# Patient Record
Sex: Female | Born: 1945 | Race: Black or African American | Hispanic: No | Marital: Single | Smoking: Former smoker
Health system: Southern US, Community
[De-identification: ages and names within clinical notes are randomized; demographics above are authoritative.]

## PROBLEM LIST (undated history)

## (undated) DIAGNOSIS — D496 Neoplasm of unspecified behavior of brain: Secondary | ICD-10-CM

## (undated) DIAGNOSIS — R609 Edema, unspecified: Secondary | ICD-10-CM

## (undated) DIAGNOSIS — F32A Depression, unspecified: Secondary | ICD-10-CM

## (undated) DIAGNOSIS — G8929 Other chronic pain: Secondary | ICD-10-CM

## (undated) DIAGNOSIS — L439 Lichen planus, unspecified: Secondary | ICD-10-CM

## (undated) DIAGNOSIS — IMO0002 Reserved for concepts with insufficient information to code with codable children: Secondary | ICD-10-CM

## (undated) DIAGNOSIS — G4700 Insomnia, unspecified: Secondary | ICD-10-CM

## (undated) DIAGNOSIS — R9430 Abnormal result of cardiovascular function study, unspecified: Secondary | ICD-10-CM

## (undated) DIAGNOSIS — I10 Essential (primary) hypertension: Secondary | ICD-10-CM

## (undated) DIAGNOSIS — F329 Major depressive disorder, single episode, unspecified: Secondary | ICD-10-CM

## (undated) DIAGNOSIS — M25569 Pain in unspecified knee: Secondary | ICD-10-CM

## (undated) DIAGNOSIS — G4730 Sleep apnea, unspecified: Secondary | ICD-10-CM

## (undated) DIAGNOSIS — R0981 Nasal congestion: Secondary | ICD-10-CM

## (undated) DIAGNOSIS — C801 Malignant (primary) neoplasm, unspecified: Secondary | ICD-10-CM

## (undated) DIAGNOSIS — Z01810 Encounter for preprocedural cardiovascular examination: Secondary | ICD-10-CM

## (undated) DIAGNOSIS — K759 Inflammatory liver disease, unspecified: Secondary | ICD-10-CM

## (undated) DIAGNOSIS — N289 Disorder of kidney and ureter, unspecified: Secondary | ICD-10-CM

## (undated) DIAGNOSIS — Z9689 Presence of other specified functional implants: Secondary | ICD-10-CM

## (undated) DIAGNOSIS — M1990 Unspecified osteoarthritis, unspecified site: Secondary | ICD-10-CM

## (undated) DIAGNOSIS — M549 Dorsalgia, unspecified: Secondary | ICD-10-CM

## (undated) DIAGNOSIS — F419 Anxiety disorder, unspecified: Secondary | ICD-10-CM

## (undated) DIAGNOSIS — Z720 Tobacco use: Secondary | ICD-10-CM

## (undated) DIAGNOSIS — E119 Type 2 diabetes mellitus without complications: Secondary | ICD-10-CM

## (undated) HISTORY — DX: Encounter for preprocedural cardiovascular examination: Z01.810

## (undated) HISTORY — DX: Edema, unspecified: R60.9

## (undated) HISTORY — PX: NOSE SURGERY: SHX723

## (undated) HISTORY — DX: Abnormal result of cardiovascular function study, unspecified: R94.30

## (undated) HISTORY — PX: ABDOMINAL HYSTERECTOMY: SHX81

## (undated) HISTORY — PX: BACK SURGERY: SHX140

## (undated) HISTORY — DX: Reserved for concepts with insufficient information to code with codable children: IMO0002

## (undated) HISTORY — DX: Tobacco use: Z72.0

## (undated) HISTORY — DX: Neoplasm of unspecified behavior of brain: D49.6

## (undated) HISTORY — PX: KNEE SURGERY: SHX244

---

## 1968-11-25 DIAGNOSIS — K759 Inflammatory liver disease, unspecified: Secondary | ICD-10-CM

## 1968-11-25 HISTORY — DX: Inflammatory liver disease, unspecified: K75.9

## 1999-01-27 ENCOUNTER — Ambulatory Visit (HOSPITAL_COMMUNITY): Admission: RE | Admit: 1999-01-27 | Discharge: 1999-01-27 | Payer: Self-pay | Admitting: Family Medicine

## 1999-01-27 ENCOUNTER — Encounter: Payer: Self-pay | Admitting: Neurosurgery

## 1999-04-29 ENCOUNTER — Inpatient Hospital Stay (HOSPITAL_COMMUNITY): Admission: RE | Admit: 1999-04-29 | Discharge: 1999-05-02 | Payer: Self-pay | Admitting: Neurosurgery

## 1999-04-29 ENCOUNTER — Encounter: Payer: Self-pay | Admitting: Neurosurgery

## 1999-09-07 ENCOUNTER — Encounter: Payer: Self-pay | Admitting: Neurosurgery

## 1999-09-07 ENCOUNTER — Encounter: Admission: RE | Admit: 1999-09-07 | Discharge: 1999-09-07 | Payer: Self-pay | Admitting: Neurosurgery

## 2000-10-29 ENCOUNTER — Encounter: Admission: RE | Admit: 2000-10-29 | Discharge: 2000-10-29 | Payer: Self-pay | Admitting: Neurosurgery

## 2000-10-29 ENCOUNTER — Encounter: Payer: Self-pay | Admitting: Neurosurgery

## 2001-01-06 ENCOUNTER — Emergency Department (HOSPITAL_COMMUNITY): Admission: EM | Admit: 2001-01-06 | Discharge: 2001-01-06 | Payer: Self-pay | Admitting: Emergency Medicine

## 2001-01-24 ENCOUNTER — Encounter (HOSPITAL_COMMUNITY): Admission: RE | Admit: 2001-01-24 | Discharge: 2001-02-23 | Payer: Self-pay | Admitting: Sports Medicine

## 2001-04-29 ENCOUNTER — Emergency Department (HOSPITAL_COMMUNITY): Admission: EM | Admit: 2001-04-29 | Discharge: 2001-04-29 | Payer: Self-pay | Admitting: Emergency Medicine

## 2001-06-18 ENCOUNTER — Ambulatory Visit (HOSPITAL_COMMUNITY): Admission: RE | Admit: 2001-06-18 | Discharge: 2001-06-18 | Payer: Self-pay | Admitting: Sports Medicine

## 2001-06-18 ENCOUNTER — Encounter: Payer: Self-pay | Admitting: Sports Medicine

## 2002-10-02 ENCOUNTER — Emergency Department (HOSPITAL_COMMUNITY): Admission: EM | Admit: 2002-10-02 | Discharge: 2002-10-02 | Payer: Self-pay | Admitting: Emergency Medicine

## 2002-12-19 ENCOUNTER — Encounter: Admission: RE | Admit: 2002-12-19 | Discharge: 2002-12-19 | Payer: Self-pay | Admitting: Neurosurgery

## 2002-12-19 ENCOUNTER — Encounter: Payer: Self-pay | Admitting: Neurosurgery

## 2003-01-09 ENCOUNTER — Encounter: Admission: RE | Admit: 2003-01-09 | Discharge: 2003-01-09 | Payer: Self-pay | Admitting: Neurosurgery

## 2003-01-09 ENCOUNTER — Encounter: Payer: Self-pay | Admitting: Neurosurgery

## 2003-12-27 ENCOUNTER — Emergency Department (HOSPITAL_COMMUNITY): Admission: EM | Admit: 2003-12-27 | Discharge: 2003-12-27 | Payer: Self-pay | Admitting: Emergency Medicine

## 2004-03-27 DIAGNOSIS — C801 Malignant (primary) neoplasm, unspecified: Secondary | ICD-10-CM

## 2004-03-27 HISTORY — DX: Malignant (primary) neoplasm, unspecified: C80.1

## 2004-08-20 ENCOUNTER — Emergency Department (HOSPITAL_COMMUNITY): Admission: EM | Admit: 2004-08-20 | Discharge: 2004-08-20 | Payer: Self-pay | Admitting: Emergency Medicine

## 2004-10-30 ENCOUNTER — Emergency Department (HOSPITAL_COMMUNITY): Admission: EM | Admit: 2004-10-30 | Discharge: 2004-10-31 | Payer: Self-pay | Admitting: Emergency Medicine

## 2007-07-09 ENCOUNTER — Ambulatory Visit: Admission: RE | Admit: 2007-07-09 | Discharge: 2007-07-09 | Payer: Self-pay | Admitting: Internal Medicine

## 2007-09-12 ENCOUNTER — Emergency Department (HOSPITAL_COMMUNITY): Admission: EM | Admit: 2007-09-12 | Discharge: 2007-09-12 | Payer: Self-pay | Admitting: Emergency Medicine

## 2008-03-30 ENCOUNTER — Encounter: Admission: RE | Admit: 2008-03-30 | Discharge: 2008-03-30 | Payer: Self-pay | Admitting: Neurosurgery

## 2008-04-23 ENCOUNTER — Ambulatory Visit (HOSPITAL_COMMUNITY): Admission: RE | Admit: 2008-04-23 | Discharge: 2008-04-24 | Payer: Self-pay | Admitting: Neurosurgery

## 2008-06-25 ENCOUNTER — Emergency Department (HOSPITAL_COMMUNITY): Admission: EM | Admit: 2008-06-25 | Discharge: 2008-06-25 | Payer: Self-pay | Admitting: Emergency Medicine

## 2008-08-20 ENCOUNTER — Emergency Department (HOSPITAL_COMMUNITY): Admission: EM | Admit: 2008-08-20 | Discharge: 2008-08-20 | Payer: Self-pay | Admitting: Emergency Medicine

## 2009-03-22 ENCOUNTER — Encounter: Admission: RE | Admit: 2009-03-22 | Discharge: 2009-03-22 | Payer: Self-pay | Admitting: Neurology

## 2009-05-06 ENCOUNTER — Encounter
Admission: RE | Admit: 2009-05-06 | Discharge: 2009-05-06 | Payer: Self-pay | Admitting: Physical Medicine and Rehabilitation

## 2009-07-22 ENCOUNTER — Emergency Department (HOSPITAL_COMMUNITY): Admission: EM | Admit: 2009-07-22 | Discharge: 2009-07-22 | Payer: Self-pay | Admitting: Emergency Medicine

## 2009-08-16 ENCOUNTER — Emergency Department (HOSPITAL_COMMUNITY): Admission: EM | Admit: 2009-08-16 | Discharge: 2009-08-16 | Payer: Self-pay | Admitting: Emergency Medicine

## 2009-09-08 ENCOUNTER — Emergency Department (HOSPITAL_COMMUNITY): Admission: EM | Admit: 2009-09-08 | Discharge: 2009-09-08 | Payer: Self-pay | Admitting: Emergency Medicine

## 2009-09-13 ENCOUNTER — Encounter: Admission: RE | Admit: 2009-09-13 | Discharge: 2009-09-13 | Payer: Self-pay | Admitting: Neurosurgery

## 2009-10-18 ENCOUNTER — Emergency Department (HOSPITAL_COMMUNITY): Admission: EM | Admit: 2009-10-18 | Discharge: 2009-10-18 | Payer: Self-pay | Admitting: Emergency Medicine

## 2009-11-15 ENCOUNTER — Emergency Department (HOSPITAL_COMMUNITY): Admission: EM | Admit: 2009-11-15 | Discharge: 2009-11-15 | Payer: Self-pay | Admitting: Emergency Medicine

## 2010-02-06 ENCOUNTER — Emergency Department (HOSPITAL_COMMUNITY): Admission: EM | Admit: 2010-02-06 | Discharge: 2010-02-06 | Payer: Self-pay | Admitting: Emergency Medicine

## 2010-03-10 ENCOUNTER — Emergency Department (HOSPITAL_COMMUNITY)
Admission: EM | Admit: 2010-03-10 | Discharge: 2010-03-10 | Payer: Self-pay | Source: Home / Self Care | Admitting: Emergency Medicine

## 2010-03-20 ENCOUNTER — Emergency Department (HOSPITAL_COMMUNITY)
Admission: EM | Admit: 2010-03-20 | Discharge: 2010-03-20 | Payer: Self-pay | Source: Home / Self Care | Admitting: Emergency Medicine

## 2010-03-27 HISTORY — PX: BRAIN SURGERY: SHX531

## 2010-04-17 ENCOUNTER — Encounter: Payer: Self-pay | Admitting: Physical Medicine and Rehabilitation

## 2010-04-17 ENCOUNTER — Encounter: Payer: Self-pay | Admitting: Neurology

## 2010-04-26 ENCOUNTER — Other Ambulatory Visit: Payer: Self-pay | Admitting: Neurosurgery

## 2010-04-26 DIAGNOSIS — R200 Anesthesia of skin: Secondary | ICD-10-CM

## 2010-04-26 DIAGNOSIS — M549 Dorsalgia, unspecified: Secondary | ICD-10-CM

## 2010-04-28 ENCOUNTER — Ambulatory Visit
Admission: RE | Admit: 2010-04-28 | Discharge: 2010-04-28 | Disposition: A | Discharge status: Home / Self Care | Payer: MEDICARE | Source: Ambulatory Visit | Attending: Neurosurgery | Admitting: Neurosurgery

## 2010-04-28 DIAGNOSIS — R200 Anesthesia of skin: Secondary | ICD-10-CM

## 2010-04-28 DIAGNOSIS — M549 Dorsalgia, unspecified: Secondary | ICD-10-CM

## 2010-04-28 MED ORDER — GADOBENATE DIMEGLUMINE 529 MG/ML IV SOLN: 15.0000 mL | Freq: Once | INTRAVENOUS | Status: AC | PRN

## 2010-06-06 ENCOUNTER — Emergency Department (HOSPITAL_COMMUNITY)
Admission: EM | Admit: 2010-06-06 | Discharge: 2010-06-07 | Disposition: A | Discharge status: Home / Self Care | Payer: Medicare Other | Attending: Emergency Medicine | Admitting: Emergency Medicine

## 2010-06-06 ENCOUNTER — Emergency Department (HOSPITAL_COMMUNITY)
Admission: EM | Admit: 2010-06-06 | Discharge: 2010-06-06 | Discharge status: Against Medical Advice | Payer: Medicare Other | Attending: Emergency Medicine | Admitting: Emergency Medicine

## 2010-06-06 DIAGNOSIS — M545 Low back pain, unspecified: Secondary | ICD-10-CM | POA: Insufficient documentation

## 2010-06-06 DIAGNOSIS — G4730 Sleep apnea, unspecified: Secondary | ICD-10-CM | POA: Insufficient documentation

## 2010-06-06 DIAGNOSIS — E119 Type 2 diabetes mellitus without complications: Secondary | ICD-10-CM | POA: Insufficient documentation

## 2010-06-06 LAB — URINALYSIS, ROUTINE W REFLEX MICROSCOPIC
Glucose, UA: NEGATIVE mg/dL
Leukocytes, UA: NEGATIVE
Nitrite: NEGATIVE

## 2010-06-06 LAB — URINE MICROSCOPIC-ADD ON

## 2010-06-07 ENCOUNTER — Emergency Department (HOSPITAL_COMMUNITY)
Admission: EM | Admit: 2010-06-07 | Discharge: 2010-06-07 | Disposition: A | Discharge status: Home / Self Care | Payer: Medicare Other | Attending: Emergency Medicine | Admitting: Emergency Medicine

## 2010-06-07 DIAGNOSIS — I10 Essential (primary) hypertension: Secondary | ICD-10-CM | POA: Insufficient documentation

## 2010-06-07 DIAGNOSIS — G8929 Other chronic pain: Secondary | ICD-10-CM | POA: Insufficient documentation

## 2010-06-07 DIAGNOSIS — M549 Dorsalgia, unspecified: Secondary | ICD-10-CM | POA: Insufficient documentation

## 2010-06-07 DIAGNOSIS — Z79899 Other long term (current) drug therapy: Secondary | ICD-10-CM | POA: Insufficient documentation

## 2010-06-07 DIAGNOSIS — Z9889 Other specified postprocedural states: Secondary | ICD-10-CM | POA: Insufficient documentation

## 2010-06-07 DIAGNOSIS — E119 Type 2 diabetes mellitus without complications: Secondary | ICD-10-CM | POA: Insufficient documentation

## 2010-07-05 LAB — URINALYSIS, ROUTINE W REFLEX MICROSCOPIC
Ketones, ur: NEGATIVE mg/dL
Nitrite: NEGATIVE
Protein, ur: >300 mg/dL — AB
Specific Gravity, Urine: 1.025 (ref 1.005–1.030)
pH: 7 (ref 5.0–8.0)

## 2010-07-05 LAB — BASIC METABOLIC PANEL
BUN: 14 mg/dL (ref 6–23)
CO2: 29 mEq/L (ref 19–32)
Calcium: 9.5 mg/dL (ref 8.4–10.5)
Chloride: 104 mEq/L (ref 96–112)
Creatinine, Ser: 1.02 mg/dL (ref 0.4–1.2)
GFR calc Af Amer: >60 mL/min (ref >60)
GFR calc non Af Amer: 55 mL/min — ABNORMAL LOW (ref >60)
Glucose, Bld: 88 mg/dL (ref 70–99)

## 2010-07-05 LAB — CBC
HCT: 40.8 % (ref 36.0–46.0)
Hemoglobin: 14 g/dL (ref 12.0–15.0)
MCHC: 34.2 g/dL (ref 30.0–36.0)
RBC: 4.44 MIL/uL (ref 3.87–5.11)
RDW: 13.6 % (ref 11.5–15.5)

## 2010-07-05 LAB — DIFFERENTIAL
Basophils Absolute: 0 10*3/uL (ref 0.0–0.1)
Eosinophils Relative: 2 % (ref 0–5)
Lymphocytes Relative: 12 % (ref 12–46)
Monocytes Absolute: 0.8 10*3/uL (ref 0.1–1.0)
Monocytes Relative: 7 % (ref 3–12)

## 2010-07-06 LAB — CBC
HCT: 40.8 % (ref 36.0–46.0)
Hemoglobin: 13.5 g/dL (ref 12.0–15.0)
MCHC: 33.2 g/dL (ref 30.0–36.0)
Platelets: 313 10*3/uL (ref 150–400)
RDW: 16.2 % — ABNORMAL HIGH (ref 11.5–15.5)

## 2010-07-06 LAB — DIFFERENTIAL
Lymphocytes Relative: 6 % — ABNORMAL LOW (ref 12–46)
Lymphs Abs: 0.8 10*3/uL (ref 0.7–4.0)
Monocytes Absolute: 0.5 10*3/uL (ref 0.1–1.0)
Monocytes Relative: 4 % (ref 3–12)
Neutro Abs: 11.7 10*3/uL — ABNORMAL HIGH (ref 1.7–7.7)
Neutrophils Relative %: 89 % — ABNORMAL HIGH (ref 43–77)

## 2010-07-06 LAB — COMPREHENSIVE METABOLIC PANEL
Albumin: 4 g/dL (ref 3.5–5.2)
Alkaline Phosphatase: 111 U/L (ref 39–117)
BUN: 9 mg/dL (ref 6–23)
Calcium: 9.6 mg/dL (ref 8.4–10.5)
Creatinine, Ser: 0.84 mg/dL (ref 0.4–1.2)
Glucose, Bld: 150 mg/dL — ABNORMAL HIGH (ref 70–99)
Potassium: 3.8 mEq/L (ref 3.5–5.1)
Total Protein: 7.9 g/dL (ref 6.0–8.3)

## 2010-07-11 LAB — BASIC METABOLIC PANEL
CO2: 25 mEq/L (ref 19–32)
Calcium: 9.7 mg/dL (ref 8.4–10.5)
Chloride: 103 mEq/L (ref 96–112)
GFR calc Af Amer: >60 mL/min (ref >60)
Glucose, Bld: 118 mg/dL — ABNORMAL HIGH (ref 70–99)
Potassium: 4.5 mEq/L (ref 3.5–5.1)
Sodium: 137 mEq/L (ref 135–145)

## 2010-07-11 LAB — CBC
HCT: 43 % (ref 36.0–46.0)
Hemoglobin: 13.9 g/dL (ref 12.0–15.0)
MCHC: 32.2 g/dL (ref 30.0–36.0)
RBC: 4.78 MIL/uL (ref 3.87–5.11)
RDW: 14.8 % (ref 11.5–15.5)

## 2010-07-11 LAB — TYPE AND SCREEN: Antibody Screen: NEGATIVE

## 2010-08-09 NOTE — Op Note (Signed)
NAMEBRISELDA, Rebecca Richards              ACCOUNT NO.:  [id number]   MEDICAL RECORD NO.:  [id number]          PATIENT TYPE:  INP   LOCATION:  2899                         FACILITY:  [facility]   PHYSICIAN:  Reinaldo Meeker, M.D. 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   PROCEDURE:  L4-5 anterior lumbar fusion from lateral retroperitoneal  approach with PEEK interbody spacer followed by L4-5 nonsegmental  instrumentation with the XLP plate.   SURGEON:  Reinaldo Meeker, MD   ASSISTANT:  Tia Alert, MD   PROCEDURE IN DETAIL:  After being placed in the lateral position with  the right side up, localizing fluoroscopy was used to identify the  appropriate level.  Area was then prepped and draped in usual sterile  fashion.  A small incision was made certainly posterior to the main  incision and using blunt finger dissection passing the fascial planes,  we were able to easily enter the retroperitoneal space.  The skin was  then used as a guide for the main incision to come down into the  retroperitoneum as well.  Sequential dilation was then used through the  psoas muscle.  Using a EMG monitoring.  On our initial approach twice we  could not get safe electronic reading and we felt that there were nerves  close particularly in the posterior direction.  Therefore, I landed more  anterior, did our dilation there, which was fine electrically, put our  retractor and then swept the retractor posterior and tested as we did  and there was no evidence of any irritation of any significant nerves,  we felt that we were in the good position at that time.  Retractor was  then sequentially opened and the disk space shim placed without  difficulty.  AP and lateral fluoroscopy showed excellent  placement of  the retractor.  The disk space was incised with a 15 blade and  thoroughly cleaned out with a variety of curettes.  Cobb periosteal  elevator was then passed across the inferior superior edge of the  endplates of L4 and L5, and then actually through the annulus on the  opposite side to free up the vertebral bodies for interbody device.  When this space was well cleared out.  We used a variety of sizers.  We  eventually decided to use a implant that was 10 lordotic, which was 10  anterior and 10 posterior.  This was then filled with OsteoSet Plus and  then packed without difficulty.  Fluoroscopy followed into good  position.  An 8-mm lateral plate of the XLP variety was then chosen.  The guide was then placed and drilling was carried out.  A 50-mm screw  was placed at L4 and a 45-mm screw at L5.  These were followed in  excellent position under fluoroscopy.  The plate was then placed over  them and the top loading nuts secured without difficulty  until the  torquing device was engaged.  Final x-rays in AP and lateral direction,  showed excellent placement of the cage plate and screws.  Irrigation was  carried out and  then the wound was closed in multiple layers of Vicryl on the fascia,  subcutaneous subcu tissues and Steri-Strips were placed on the skin.  Sterile dressing was then applied.  The patient was extubated and taken  to recovery room in stable condition.           ______________________________  Reinaldo Meeker, M.D.     ROK/MEDQ  D:  04/23/2008  T:  04/24/2008  Job:  [id number]

## 2010-08-12 NOTE — Procedures (Signed)
NAMEGERALDA, Rebecca Richards              ACCOUNT NO.:  [id number]   MEDICAL RECORD NO.:  [id number]          PATIENT TYPE:  OUT   LOCATION:  SLEE                          FACILITY:  [facility]   PHYSICIAN:  Kofi A. Gerilyn Pilgrim, M.D. DATE OF BIRTH:  1945/05/14   DATE OF PROCEDURE:  DATE OF DISCHARGE:  07/09/2007                             SLEEP DISORDER REPORT   POLYSOMNOGRAPHY REPORT.   REFERRING PHYSICIAN:  Kofi A. Gerilyn Pilgrim, M.D.   INDICATION:  This is a 65 year old lady who presents with daytime  sleepiness, loud snoring, suspicious for sleep apnea.   MEDICATIONS:  1. Wellbutrin.  2. Lotrel.  3. Vicodin.  4. Xanax.  5. Afrin nasal spray.   BMI 36 and Epworth sleepiness scale of 14.   SLEEP STAGE SUMMARY:  This was a split night study with the first half  having a total recording time of  136 minutes.  The titration was 230  minutes.  The sleep latency in diagnostic portion is 2 minutes and REM  latency 41 minutes.  Sleep efficiency in the diagnostic portion 83% and  91% to the titration portion.   RESPIRATORY SUMMARY:  The baseline oxygen saturation is 96%, the lowest  saturation is 77%.  The AHI of the diagnostic portion shows 113.  There  were 56 obstructive events and 159 hypopneic events.  The patient was  titrated between a pressure of 5 and 15.  She does very well between  pressure of 13 and 15 and could utilize any of these pressures.   LEG MOVEMENT SUMMARY:  She did have some PLM noted on the final  pressure.  The index is 11.   ELECTROCARDIOGRAM SUMMARY:  She had rare PVCs with average heart rate at  78.   IMPRESSION:  Severe obstructive sleep apnea syndrome which responded  well to pressure between 13 and 15.  I would recommend 13 which is the  lowest effective pressure.   Thanks for this referral.      Kofi A. Gerilyn Pilgrim, M.D.  Electronically Signed     KAD/MEDQ  D:  07/19/2007  T:  07/20/2007  Job:  [id number]

## 2010-08-12 NOTE — H&P (Signed)
[place]  Patient:    Rebecca Richards                        MRN: [id number] Adm. Date:  [id number] Attending:  Danella Penton                         History and Physical  HISTORY:  Rebecca Richards is a lady who I saw initially back in September 2000 because of back pain down to both legs, right worse than the left one.  The patient denies any sensory changes, and according to her she has good days and bad days.  The patient was seen by an orthopedic surgeon.  Later on had evaluation by a neurologist here in [place], who gave conservative treatment without improvement.  As a part of the work-up, she had an MRI and EMG and nerve conduction velocity.  Because of the findings she was seen by [place].  PAST MEDICAL HISTORY:  Hysterectomy, knee surgery, and sinus surgery.  ALLERGIES:  She is not allergic to any medications.  SOCIAL HISTORY:  The patient does not smoke or drink.  She is 5 feet 1 inches and weighs 195 pounds.  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  FAMILY HISTORY:  Mother is 27 with diabetes.  Father is in good health.  PHYSICAL EXAMINATION:  HEENT:  Normal.  NECK:  She has good flexibility.  LUNGS:  Clear.  HEART:  Heart sounds normal.  ABDOMEN:  Normal.  EXTREMITIES:  Normal pulses.  There are some changes in the skin in both feet secondary to the lichen planus.  There is no edema.  NEUROLOGIC:  Mental status normal.  Cranial nerves normal.  Reflexes 2+.  No Babinskis.  Sensation normal.  Straight leg raising is positive in the right side to 30 degrees, in the left side about 60 degrees.  She has tenderness to palpation in both SI joints.  DIAGNOSTIC STUDIES:  The MRI showed that indeed this lady has a hypertrophy facet at the level of 4-5 and borderline between 3-4.  At the level of 5-1 she has minimal stenosis.  The EMG nerve  conduction velocity showed that she might have L5 radiculopathy.  CLINICAL IMPRESSION:  Lumbar stenosis secondary to hypertrophy of the facet.  RECOMMENDATIONS:  The patient decided to go ahead with surgery.  She knows that the procedure will close to two hours and she is going to have quite a bit of pain after surgery.  The risk, of course, is no improvement, worsening of the pain, eed for further surgery, infection, and CSF leak. DD:  04/29/99 TD:  04/29/99 Job: [id number]

## 2010-08-12 NOTE — Discharge Summary (Signed)
[place]  Patient:    Rebecca Richards                        MRN: [id number] Adm. Date:  [id number] Disc. Date: [id number] Attending:  Danella Penton                           Discharge Summary  ADMISSION DIAGNOSIS:  Lumbar stenosis.  FINAL DIAGNOSIS:  Lumbar stenosis.  CLINICAL HISTORY:  The patient was admitted because of back pain, radiation down to both legs.  X-rays showed stenosis at the level 3-4 and 4-5.  Surgery was advised.  Laboratory normal.  COURSE IN HOSPITAL:  The patient was taken to surgery, and bilateral L4-L5 laminectomy and partial L3, followed by foraminotomy was done.  Today, she is doing much better.  She still has some residual back pain, but the leg pain is getting better.  She is being discharged to be followed by me in my office.  CONDITION ON DISCHARGE:  Improving.  MEDICATIONS:  Percocet, diazepam.  DIET:  Regular.  ACTIVITIES:  Not to drive for at least 10 days.  FOLLOW-UP:  To be seen by me in three weeks. DD:  05/02/99 TD:  05/02/99 Job: [id number]

## 2010-08-12 NOTE — Op Note (Signed)
[place]. [facility]  Patient:    Rebecca Richards                        MRN: [id number] Proc. Date: 04/29/99 Adm. Date:  [id number] Attending:  Danella Penton Dictator:   Tanya Nones. Jeral Fruit, M.D.                           Operative Report  PREOPERATIVE DIAGNOSIS:  Lumbar stenosis L4-5, borderline L3-4.  Spinal canal of 7 mm.  POSTOPERATIVE DIAGNOSIS:  Lumbar stenosis L4-5, borderline L3-4.  Spinal canal f 7 mm.  PROCEDURE:  Bilateral L4-5 laminectomy.  Partial L3 laminectomy, foraminotomy, decompression of the L3-4 and L5-S1 nerve root.  MICROSCOPE:  Midas Rex.  SURGEON:  Dr. Jeral Fruit.  ASSISTANT:  Alanson Aly. Roxan Hockey, M.D.  CLINICAL HISTORY:  The patient is a 65 year old female complaining of back pain  with radiation to both legs, right worse than the left one.  She has failed conservative treatment.  X-rays showed that she has a stenosis between L4-5 with the canal being 7 mm.  She has a borderline at the level of L3-4.  Surgery was advised.  The patient was aware of the risks of infection, CSF leak, worsening pain, paralysis, need for further surgery.  DESCRIPTION OF PROCEDURE:  The patient was taken to the operating room and she underwent procedure.  The back was prepped with Betadine.  A midline incision from L3 to S1 was made.  Muscles were retracted laterally.  We took a x-ray which showed that indeed we were at the level of the L4.  From then on, with the Stille rongeur we removed the ______ process at 5, 4, and partial of 3.  Then with the Midas Rex we did bilateral laminectomy at L4-5 and the level of L3.  We found a thick yellow ligament mostly at the level of L4-5 and also at the level of L3-4.  Removal with the microscope was done.  We drilled laterally, leaving most of the 2/3 of the facet.  With the microscope we did a foraminotomy, decompressing the L3-4 and L5-S1 nerve root.  At the end we had plenty of space.  There  was no evidence of any cerebrospinal fluid leak.  Hemostasis was done with bipolar.  Then fentanyl and  Depo-Medrol were left the epidural space, and the wound was closed with Vicryl nd Steri-Strips. DD:  04/29/99 TD:  04/30/99 Job: [id number]

## 2010-08-30 ENCOUNTER — Emergency Department (HOSPITAL_COMMUNITY)
Admission: EM | Admit: 2010-08-30 | Discharge: 2010-08-30 | Disposition: A | Discharge status: Home / Self Care | Payer: Medicare Other | Attending: Emergency Medicine | Admitting: Emergency Medicine

## 2010-08-30 DIAGNOSIS — R197 Diarrhea, unspecified: Secondary | ICD-10-CM | POA: Insufficient documentation

## 2010-08-30 DIAGNOSIS — E119 Type 2 diabetes mellitus without complications: Secondary | ICD-10-CM | POA: Insufficient documentation

## 2010-08-30 DIAGNOSIS — R1013 Epigastric pain: Secondary | ICD-10-CM | POA: Insufficient documentation

## 2010-08-30 DIAGNOSIS — R112 Nausea with vomiting, unspecified: Secondary | ICD-10-CM | POA: Insufficient documentation

## 2010-08-30 DIAGNOSIS — I10 Essential (primary) hypertension: Secondary | ICD-10-CM | POA: Insufficient documentation

## 2010-08-30 DIAGNOSIS — G4730 Sleep apnea, unspecified: Secondary | ICD-10-CM | POA: Insufficient documentation

## 2010-09-20 ENCOUNTER — Ambulatory Visit (INDEPENDENT_AMBULATORY_CARE_PROVIDER_SITE_OTHER): Payer: Medicare Other | Admitting: Psychiatry

## 2010-09-20 DIAGNOSIS — F411 Generalized anxiety disorder: Secondary | ICD-10-CM

## 2010-09-21 NOTE — Group Therapy Note (Signed)
Rebecca Richards, Rebecca Richards              ACCOUNT NO.:  [id number]  MEDICAL RECORD NO.:  [id number]  LOCATION:  [facility]                           FACILITY:  [facility]  PHYSICIAN:  Tillman Kazmierski T. Ramel Tobon, M.D.   DATE OF BIRTH:  [birth year]                                PROGRESS NOTE  09/21/10 The patient is a 65 year old single, retired, [place] American female who is referred from her primary care doctor for seeking treatment.  The patient endorsed insomnia which has been for a long time. She had tried multiple medication for the insomnia but it appears that none of the medicine works for long period of time.  She likes to get Xanax from her primary care doctor.  However, he refused to give more Xanax and recommended to see psychiatrist.  The patient admitted that she had history of depression on and off for at least 12 years and has taken multiple medication in the past.  However, she believed recently her main concern is lack of sleep.  She recently had back surgery in March for her chronic back pain and now she is using pain stimulator since then.  Patient was told that this stimulator was supposed to reduce her pain and she was hoping that her recovery would be faster but she felt that she still has a struggle dealing with the pain.  She noted that she has been sometimes tired, decreased energy and does not want to talk to someone.  She admitted that she has sleep apnea and take CPAP machine but her sleep is only few hours.  Though she denies any depressive thoughts, suicidal ideation or hopeless feeling, she endorsed sometimes she gets irritable when she does not sleep for more than a few days.  In the past she had tried Xanax, Lunesta, Ambien, trazodone and recently Restoril for insomnia, which she believed did work for few weeks and then this stopped working.  Dr. Sherryll Burger refused to give more Xanax, which she believes helped the most.  She also had tried in past 10 years multiple antidepressants  including Cymbalta, Zoloft, Paxil, Prozac, Pristiq and all of this medicine cause increased jitteriness and restlessness.  She has been taking Wellbutrin, which has been prescribed in the past by Dr. Eliberto Ivory and then Dr. Parke Simmers, and recently by Dr. Sherryll Burger. She told that she was not taking the Wellbutrin.  However, in March after the surgery the doctor recommended to go back on Wellbutrin.  She is taking Wellbutrin 150 twice a day.  The patient is hoping that if her sleep get better, her mood and anxiety will also get better.  Patient told that she has taken in the past Seroquel, which works very well. However, she gained some weight.  Patient told that she is willing to take any medication that can help her sleep.  She also endorsed anxiety and depressed mood on occasions but she denies any hallucination, paranoia or suicidal or homicidal thinking.  PAST PSYCHIATRIC HISTORY: Patient told that she has been taking the antidepressants for almost 12 years.  She did not remember what triggered the depression but believes may be her chronic back pain that has caused her sad mood and depression.  She  denies any previous history of suicidal attempt or any previous history of psychiatric inpatient treatment.  She has never seen psychiatrist but she was working as a Theatre manager in [employer] and has been involved in discussion with doctors  but there were no formal psychiatric follow-ups in the past.  FAMILY HISTORY: The patient endorsed her mother and brother have history of schizophrenia.  PSYCHOSOCIAL HISTORY: The patient was born and raised in [place].  She was never married.  She has a 73 year old son who recently moved in since she had back surgery.  The patient has some contact with the father of her son. The patient is currently retired.  MEDICAL HISTORY: The patient told she has a history of chronic back pain for at least 10 years and she has L4-L5 fusion.   She was diagnosed with L4-5 central canal stenosis.  She also has sleep apnea, hypertension.  Her current primary care doctor is Dr. Sherryll Burger in [place].  She also sees Dr. Gerilyn Pilgrim for chronic pain.  CURRENT MEDICATIONS: 1. Wellbutrin SR 150 twice a day. 2. Amlodipine 5 mg a day. 3. Potassium 10 mEq a day. 4. Suboxone 2 mg prescribed by Dr. Gerilyn Pilgrim. 5. She has been given temazepam.  However, the patient is not taking     as the temazepam stopped working.  ALCOHOL AND SUBSTANCE ABUSE HISTORY: The patient has history of using drugs in her 31s.  She admitted using LSD, cocaine, marijuana and alcohol but her drug of choice was marijuana and alcohol.  She has history of DWI almost 20 years ago.  The patient claimed to be sober from using these drugs for at least 20 years.  EDUCATION AND WORK HISTORY: The patient is a high school graduate with some college.  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  Currently she is disabled.  ALLERGIES: The patient does not remember but do not know of any known drug allergies.  However, the patient reported sensitivity with antidepressant.  MENTAL STATUS EXAM: The patient is casually dressed.  She is of short stature and fairly groomed.  Her speech is soft, clear and coherent.  She maintained a superficial eye contact and at times guarded about her past history. She denies any auditory hallucinations, suicidal thoughts or homicidal thoughts.  Her thought processes were also logical, linear and goal- directed.  Her attention and concentration were okay.  She is alert and oriented x3.  She was using a cane to support her walking.  Her insight, judgment, and impulse control were okay.  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 Axis II:  Deferred. Axis III:  See medical history. Axis IV:  Mild  to moderate. Axis V:  60.  Her weight today was 160 pounds.  PLAN: I talked to the patient at length about her symptoms.  I do believe that she needs a medication that should help her insomnia, anxiety and residual depressive symptoms.  She has taken antidepressants in the past.  However, I discussed with her to try Depakote that can target her mood, insomnia, anxiety at the same time but she is concerned about the weight gain.  However, willing to take the chance if this medicine works well for her insomnia and residual symptoms of depression.  I have also talked in length about the risks and benefits of psychiatric medication. It appears that the patient  is resistant to these sedatives and may need nonnarcotic and noncontrolled medication to help her illness.  I also recommended to decrease her Wellbutrin to take only in the morning as sometimes second Wellbutrin can cause insomnia.  I recommended to see a counselor.  However, the patient will discuss on the next follow-up visit about seeing a therapist.  I recommended to give [place] a call if she has any question or if she have worsening of the symptoms or having any suicidal thinking and homicidal thinking that she needs to call 9-1-1 immediately which she acknowledged.  I will see her again in 3 weeks.     Ryo Klang T. Lolly Mustache, M.D. STA/MEDQ  D:  09/20/2010  T:  09/20/2010  Job:  [id number]  Electronically Signed by Kathryne Sharper M.D. on 09/21/2010 04:31:54 PM

## 2010-12-22 LAB — CBC
Hemoglobin: 12.9
MCHC: 33.3
MCV: 90
RBC: 4.31
WBC: 7.8

## 2010-12-22 LAB — COMPREHENSIVE METABOLIC PANEL
ALT: 13
AST: 22
CO2: 31
Chloride: 104
Creatinine, Ser: 1.28 — ABNORMAL HIGH
GFR calc Af Amer: 52 — ABNORMAL LOW
GFR calc non Af Amer: 43 — ABNORMAL LOW
Glucose, Bld: 133 — ABNORMAL HIGH
Sodium: 142
Total Bilirubin: 0.7

## 2010-12-22 LAB — DIFFERENTIAL
Basophils Absolute: 0
Basophils Relative: 0
Eosinophils Absolute: 0.2
Eosinophils Relative: 3
Neutrophils Relative %: 68

## 2010-12-22 LAB — LIPASE, BLOOD: Lipase: 31

## 2011-03-11 ENCOUNTER — Other Ambulatory Visit (HOSPITAL_COMMUNITY): Payer: Self-pay | Admitting: Psychiatry

## 2011-03-15 ENCOUNTER — Emergency Department (HOSPITAL_COMMUNITY)
Admission: EM | Admit: 2011-03-15 | Discharge: 2011-03-15 | Disposition: A | Discharge status: Home / Self Care | Payer: Medicare Other | Attending: Emergency Medicine | Admitting: Emergency Medicine

## 2011-03-15 ENCOUNTER — Encounter: Payer: Self-pay | Admitting: Emergency Medicine

## 2011-03-15 ENCOUNTER — Emergency Department (HOSPITAL_COMMUNITY): Payer: Medicare Other

## 2011-03-15 DIAGNOSIS — Z859 Personal history of malignant neoplasm, unspecified: Secondary | ICD-10-CM | POA: Insufficient documentation

## 2011-03-15 DIAGNOSIS — F17200 Nicotine dependence, unspecified, uncomplicated: Secondary | ICD-10-CM | POA: Insufficient documentation

## 2011-03-15 DIAGNOSIS — M1710 Unilateral primary osteoarthritis, unspecified knee: Secondary | ICD-10-CM | POA: Insufficient documentation

## 2011-03-15 DIAGNOSIS — M1712 Unilateral primary osteoarthritis, left knee: Secondary | ICD-10-CM

## 2011-03-15 DIAGNOSIS — F329 Major depressive disorder, single episode, unspecified: Secondary | ICD-10-CM | POA: Insufficient documentation

## 2011-03-15 DIAGNOSIS — F3289 Other specified depressive episodes: Secondary | ICD-10-CM | POA: Insufficient documentation

## 2011-03-15 DIAGNOSIS — M25469 Effusion, unspecified knee: Secondary | ICD-10-CM | POA: Insufficient documentation

## 2011-03-15 DIAGNOSIS — I10 Essential (primary) hypertension: Secondary | ICD-10-CM | POA: Insufficient documentation

## 2011-03-15 DIAGNOSIS — M25569 Pain in unspecified knee: Secondary | ICD-10-CM | POA: Insufficient documentation

## 2011-03-15 DIAGNOSIS — M79609 Pain in unspecified limb: Secondary | ICD-10-CM | POA: Insufficient documentation

## 2011-03-15 DIAGNOSIS — IMO0002 Reserved for concepts with insufficient information to code with codable children: Secondary | ICD-10-CM | POA: Insufficient documentation

## 2011-03-15 HISTORY — DX: Disorder of kidney and ureter, unspecified: N28.9

## 2011-03-15 HISTORY — DX: Depression, unspecified: F32.A

## 2011-03-15 HISTORY — DX: Major depressive disorder, single episode, unspecified: F32.9

## 2011-03-15 HISTORY — DX: Malignant (primary) neoplasm, unspecified: C80.1

## 2011-03-15 HISTORY — DX: Essential (primary) hypertension: I10

## 2011-03-15 MED ORDER — OXYCODONE-ACETAMINOPHEN 5-325 MG PO TABS
1.0000 | ORAL_TABLET | Freq: Once | ORAL | Status: AC
  Administered 2011-03-15: 1 via ORAL
  Filled 2011-03-15: qty 1

## 2011-03-15 MED ORDER — IBUPROFEN 600 MG PO TABS: 600.0000 mg | ORAL_TABLET | Freq: Four times a day (QID) | ORAL | Status: AC | PRN

## 2011-03-15 MED ORDER — KETOROLAC TROMETHAMINE 30 MG/ML IJ SOLN
INTRAMUSCULAR | Status: AC
  Administered 2011-03-15: 15 mg via INTRAMUSCULAR
  Filled 2011-03-15: qty 1

## 2011-03-15 MED ORDER — KETOROLAC TROMETHAMINE 15 MG/ML IJ SOLN
15.0000 mg | Freq: Once | INTRAMUSCULAR | Status: DC
  Filled 2011-03-15: qty 1

## 2011-03-15 NOTE — ED Notes (Signed)
Left in c/o family for transport home; instructions reviewed and f/u information provided-verbalizes understanding.

## 2011-03-15 NOTE — ED Provider Notes (Signed)
History     CSN: [id number] Arrival date & time: 03/15/2011  8:27 AM   First MD Initiated Contact with Patient 03/15/11 [id number]      Chief Complaint  Patient presents with  . Leg Pain    (Consider location/radiation/quality/duration/timing/severity/associated sxs/prior treatment) HPI 65 year old with history of OA, lumbar laminectomy/fusion, and tricompatmental L knee degeneration comes in with L knee pain since yesterday.  Was kneeling in living room getting out Christmas ornaments for a while.  Following this, her knee was throbbing.  Two hours later, she was having severe L knee pain.  Diffuse around the knee.  Considerable swelling as well, so much that she had trouble fitting her pajama pants over the knee.  No fevers or chills. No rash.  At baseline she walks with a walker and can walk less than a block due to L knee pain.    She is trying to get an appt to see Dr. Romeo Apple in orthopaedics after the holidays.    Has a history of polysubstance abuse but only tobacco and occasional alcohol for past two years.    Past Medical History  Diagnosis Date  . Depression   . Guillain-Barre   . Hypertension   . Renal disorder   . Cancer     Past Surgical History  Procedure Date  . Abdominal hysterectomy   . Knee surgery   . Back surgery   . Nose surgery     History reviewed. No pertinent family history.  History  Substance Use Topics  . Smoking status: Passive Smoker  . Smokeless tobacco: Not on file  . Alcohol Use: No     Review of Systems  All other systems reviewed and are negative.    Allergies  Review of patient's allergies indicates no known allergies.  Home Medications   Current Outpatient Rx  Name Route Sig Dispense Refill  . BUPROPION HCL ER (SR) 150 MG PO TB12 Oral Take 150 mg by mouth daily.        BP 130/71  Pulse 96  Temp 98.4 F (36.9 C)  Resp 20  Ht 5\' 1"  (1.549 m)  Wt 178 lb (80.74 kg)  BMI 33.63 kg/m2  SpO2 96%  Physical  Exam  General: alert, well-developed, and cooperative to examination.  Head: normocephalic and atraumatic.  Eyes: vision grossly intact, pupils equal, pupils round, pupils reactive to light, no injection and anicteric.  Mouth: pharynx pink and moist, no erythema, and no exudates.  Lungs: normal respiratory effort, no accessory muscle use, normal breath sounds, no crackles, and no wheezes. Heart: normal rate, regular rhythm, no murmur, no gallop, and no rub.  Pulses: 2+ DP/PT pulses bilaterally  Extremities: R leg: No swelling, erythema. Full knee ROM without pain     L leg: L knee swollen compared to right.  Significant effusion present.  No erythema or ecchymosis.  Pain with extension but achieves full extension.  Pain with flexion past 30 degrees, did not push her past 45 degrees due to extent of pain.  Tender to palpation diffusely around knee, worst anteriorly.  Patellar motion causes some pain but not nearly as bad as knee flexion.  L foot has sensation in all toes and good pulses.  Neurologic: alert & oriented X3, cranial nerves II-XII intact. Skin: turgor normal and no rashes.    ED Course  Procedures (including critical care time)  Toradol shot and one percocet 5/325 tablet given Knee immobilizer given as well for pt to wear at  home if it helps her pain/mobility  1. Degenerative arthritis of left knee       MDM  I reviewed this case with my attending Dr. Adriana Simas who also interviewed and examined the patient.  Plan is to discharge with ibuprofen and knee immobilizer.  Follow-up with Dr.  Romeo Apple in orthopaedics.  Patient instructed not to take [facility] or goody powder while taking ibuprofen.       Blanca Friend, MD 03/15/11 1005  Blanca Friend, MD 03/15/11 1008

## 2011-03-15 NOTE — ED Notes (Signed)
Pt c/o left knee pain since yesterday. Denies injury.

## 2011-03-15 NOTE — ED Notes (Signed)
Reports hx of "problems" with left knee; states fell yesterday, but denies injury to left knee; reports swelling and pain onset yesterday, worse today.

## 2011-03-15 NOTE — ED Notes (Signed)
Knee immobilizer placed left knee 

## 2011-03-15 NOTE — ED Provider Notes (Signed)
  I performed a history and physical examination of Rebecca Richards and discussed her management with Dr Yaakov Guthrie.  I agree with the history, physical, assessment, and plan of care, with the following exceptions: None  I was present for the following procedures: None Time Spent in Critical Care of the patient: None Time spent in discussions with the patient and family:   [name], MD 03/15/11 1355

## 2011-04-17 ENCOUNTER — Emergency Department (HOSPITAL_COMMUNITY)
Admission: EM | Admit: 2011-04-17 | Discharge: 2011-04-17 | Disposition: A | Discharge status: Home / Self Care | Payer: Medicare Other | Attending: Emergency Medicine | Admitting: Emergency Medicine

## 2011-04-17 ENCOUNTER — Emergency Department (HOSPITAL_COMMUNITY): Payer: Medicare Other

## 2011-04-17 ENCOUNTER — Encounter (HOSPITAL_COMMUNITY): Payer: Self-pay

## 2011-04-17 DIAGNOSIS — G4700 Insomnia, unspecified: Secondary | ICD-10-CM | POA: Insufficient documentation

## 2011-04-17 DIAGNOSIS — I10 Essential (primary) hypertension: Secondary | ICD-10-CM | POA: Insufficient documentation

## 2011-04-17 DIAGNOSIS — Z859 Personal history of malignant neoplasm, unspecified: Secondary | ICD-10-CM | POA: Insufficient documentation

## 2011-04-17 DIAGNOSIS — Y92009 Unspecified place in unspecified non-institutional (private) residence as the place of occurrence of the external cause: Secondary | ICD-10-CM | POA: Insufficient documentation

## 2011-04-17 DIAGNOSIS — S63509A Unspecified sprain of unspecified wrist, initial encounter: Secondary | ICD-10-CM | POA: Insufficient documentation

## 2011-04-17 DIAGNOSIS — M549 Dorsalgia, unspecified: Secondary | ICD-10-CM | POA: Insufficient documentation

## 2011-04-17 DIAGNOSIS — G8929 Other chronic pain: Secondary | ICD-10-CM | POA: Insufficient documentation

## 2011-04-17 DIAGNOSIS — F3289 Other specified depressive episodes: Secondary | ICD-10-CM | POA: Insufficient documentation

## 2011-04-17 DIAGNOSIS — R51 Headache: Secondary | ICD-10-CM | POA: Insufficient documentation

## 2011-04-17 DIAGNOSIS — F329 Major depressive disorder, single episode, unspecified: Secondary | ICD-10-CM | POA: Insufficient documentation

## 2011-04-17 DIAGNOSIS — M25569 Pain in unspecified knee: Secondary | ICD-10-CM | POA: Insufficient documentation

## 2011-04-17 DIAGNOSIS — Z9079 Acquired absence of other genital organ(s): Secondary | ICD-10-CM | POA: Insufficient documentation

## 2011-04-17 DIAGNOSIS — W1809XA Striking against other object with subsequent fall, initial encounter: Secondary | ICD-10-CM | POA: Insufficient documentation

## 2011-04-17 MED ORDER — ZOLPIDEM TARTRATE 5 MG PO TABS: 2.5000 mg | ORAL_TABLET | Freq: Every evening | ORAL | Status: DC | PRN

## 2011-04-17 NOTE — ED Notes (Signed)
Lab called and informed RN that urine specimen was spilled in the bag and that new urine specimen needed to be recollected. Patient unable to give new urine specimen. Dr Rubin Payor made aware.

## 2011-04-17 NOTE — ED Provider Notes (Signed)
History  This chart was scribed for [employer]. Rubin Payor, MD by Bennett Scrape. This patient was seen in room APA17/APA17 and the patient's care was started at 5:12PM.  CSN: [id number]  Arrival date & time 04/17/11  1533   First MD Initiated Contact with Patient 04/17/11 1708       Chief complaint is a fall.   The history is provided by the patient. No language interpreter was used.    Rebecca Richards is a 66 y.o. female who presents to the Emergency Department complaining of a fall that occurred 30 minutes PTA. She states that she was walking up a ramp to a friend's house when her can got stuck in a hole and she fell on her right side. She states that she hit her head on the ramp but denies LOC. She c/o right wrist, left knee, HA and lower back pain. She has not taken any medications PTA to improve symptoms. She states that the pain is worse with movement and improved with rest. She denies abdominal pain, nausea and vomiting as associated symptoms. She has a h/o chronic back pain which she states she had surgery for 2 months ago and chronic left knee pain which she has an appointment for a knee replacement within the next couple of months. She is unsure of the exact procedure date and who the surgeon is. At baseline, she walks with a cane and uses knee braces on the left knee. She has a h/o HTN and renal disorder. She states that she is a passive smoker but denies alcohol use.   Pt states that her PCP is Dr. Corinda Gubler and Dr. Romeo Apple is her orthopedic doctor.   Past Medical History  Diagnosis Date  . Depression   . Guillain-Barre   . Hypertension   . Renal disorder   . Cancer     Past Surgical History  Procedure Date  . Abdominal hysterectomy   . Knee surgery   . Nose surgery   . Brain surgery     pituitary tumor  . Back surgery     pt has stimulator in lower back and can't have an MRI    No family history on file.  History  Substance Use Topics  . Smoking status: Passive  Smoker  . Smokeless tobacco: Not on file  . Alcohol Use: No    OB History    Grav Para Term Preterm Abortions TAB SAB Ect Mult Living                  Review of Systems  Constitutional: Negative for fever and chills.  HENT: Negative for congestion, sore throat and neck pain.   Eyes: Negative for pain.  Respiratory: Negative for cough and shortness of breath.   Cardiovascular: Negative for chest pain.  Gastrointestinal: Negative for nausea, vomiting, abdominal pain and diarrhea.  Genitourinary: Negative for dysuria and hematuria.  Musculoskeletal: Positive for back pain (Chronic but worse today).  Skin: Negative for rash.  Neurological: Positive for headaches. Negative for weakness.    Allergies  Review of patient's allergies indicates no known allergies.  Home Medications   Current Outpatient Rx  Name Route Sig Dispense Refill  . ALPRAZOLAM 1 MG PO TABS Oral Take 1 mg by mouth 3 (three) times daily as needed. anxiety    . VITAMIN B COMPLEX PO Oral Take 1 tablet by mouth daily.      Marland Kitchen BUPRENORPHINE HCL-NALOXONE HCL 2-0.5 MG SL SUBL Sublingual Place 1 tablet under  the tongue 3 (three) times daily.     . BUPROPION HCL ER (SR) 150 MG PO TB12 Oral Take 150 mg by mouth 2 (two) times daily.     . CO Q 10 PO Oral Take 1 tablet by mouth daily.      . OMEGA-3 FATTY ACIDS 1000 MG PO CAPS Oral Take 1 g by mouth daily.      . FUROSEMIDE 20 MG PO TABS Oral Take 20 mg by mouth daily.      . METHYLPHENIDATE HCL 10 MG PO TABS Oral Take 10 mg by mouth 3 (three) times daily.      . CHLORPROMAZINE HCL 25 MG PO TABS Oral Take 25 mg by mouth at bedtime.      Marland Kitchen DOXEPIN HCL 25 MG PO CAPS Oral Take 25 mg by mouth at bedtime.      Marland Kitchen TIZANIDINE HCL 2 MG PO TABS Oral Take 2 mg by mouth at bedtime.      Marland Kitchen ZOLPIDEM TARTRATE 5 MG PO TABS Oral Take 0.5 tablets (2.5 mg total) by mouth at bedtime as needed for sleep. 5 tablet 0    Triage Vitals: BP 139/93  Pulse 113  Temp(Src) 97.9 F (36.6 C) (Oral)   Resp 20  Ht 5\' 1"  (1.549 m)  Wt 175 lb (79.379 kg)  BMI 33.07 kg/m2  SpO2 99%  Physical Exam  Nursing note and vitals reviewed. Constitutional: She is oriented to person, place, and time. She appears well-developed and well-nourished.  HENT:  Head: Normocephalic and atraumatic.  Eyes: Conjunctivae and EOM are normal.  Neck: Normal range of motion. Neck supple.  Cardiovascular: Normal rate and regular rhythm.  Exam reveals no gallop and no friction rub.   No murmur heard. Pulmonary/Chest: Effort normal and breath sounds normal. No respiratory distress.  Abdominal: Soft. She exhibits no distension.       Right CVA tenderness  Musculoskeletal: She exhibits tenderness. She exhibits no edema.       Right snuff box tenderness, passive motion is intact; mild medial tenderness of the left knee, ROM is intact; neuro stimulator in left flank area  Neurological: She is alert and oriented to person, place, and time. No cranial nerve deficit.  Skin: Skin is warm and dry. No rash noted.  Psychiatric: She has a normal mood and affect. Her behavior is normal.    ED Course  Procedures (including critical care time)  DIAGNOSTIC STUDIES: Oxygen Saturation is 99% on room air, normal by my interpretation.    COORDINATION OF CARE: 5:14PM-Discussed x-ray of right wrist and left knee and urinalysis. Pt is requesting medication for sleep apnea.      Labs Reviewed  URINALYSIS, ROUTINE W REFLEX MICROSCOPIC   Dg Wrist Complete Right  04/17/2011  *RADIOLOGY REPORT*  Clinical Data: Status post fall.  Pain.  RIGHT WRIST - COMPLETE 3+ VIEW  Comparison: None.  Findings: No acute bony or joint abnormality is identified.  There is some degenerative disease of the base of the thumb.  Soft tissues are unremarkable.  IMPRESSION: No acute finding.  Original Report Authenticated By: Bernadene Bell. D'ALESSIO, M.D.   Dg Knee Complete 4 Views Left  04/17/2011  *RADIOLOGY REPORT*  Clinical Data: Post fall  LEFT KNEE -  COMPLETE 4+ VIEW  Comparison: 03/15/2011  Findings: Four views of the left knee submitted. Extensive tricompartment degenerative changes again noted.  Large joint effusion again noted.  Diffuse osteopenia.  No acute fracture or subluxation.  IMPRESSION: No acute fracture  or subluxation.  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  Original Report Authenticated By: Natasha Mead, M.D.     1. Wrist sprain   2. Insomnia       MDM  Patient had a trip and fall. Right wrist pain snuff box tenderness. Patient was splinted. Also left knee pain. Also left flank pain. Patient left before the urinalysis could be done. She'll follow with her orthopedic surgeon. She is also complaining of insomnia and was given a short course of Ambien.    I personally performed the services described in this documentation, which was scribed in my presence. The recorded information has been reviewed and considered.      Juliet Rude. Rubin Payor, MD 04/17/11 1905

## 2011-04-17 NOTE — ED Notes (Signed)
Pt says since having back surgery, says she "wabbles a little bit."  Pt says today pt went to her friends house and was walking up a ramp and accidentally stuck her cane in a  Hole and fell.  C/O pain to R wrist, headache, left knee.  Says hit head on the wooden ramp but did not lose consicousness.

## 2011-04-26 ENCOUNTER — Encounter: Payer: Self-pay | Admitting: Cardiology

## 2011-04-26 ENCOUNTER — Ambulatory Visit (INDEPENDENT_AMBULATORY_CARE_PROVIDER_SITE_OTHER): Payer: Medicare Other | Admitting: Cardiology

## 2011-04-26 VITALS — BP 140/92 | HR 91 | Ht 61.0 in | Wt 172.0 lb

## 2011-04-26 DIAGNOSIS — F17200 Nicotine dependence, unspecified, uncomplicated: Secondary | ICD-10-CM

## 2011-04-26 DIAGNOSIS — F329 Major depressive disorder, single episode, unspecified: Secondary | ICD-10-CM | POA: Insufficient documentation

## 2011-04-26 DIAGNOSIS — Z01810 Encounter for preprocedural cardiovascular examination: Secondary | ICD-10-CM

## 2011-04-26 DIAGNOSIS — N289 Disorder of kidney and ureter, unspecified: Secondary | ICD-10-CM | POA: Insufficient documentation

## 2011-04-26 DIAGNOSIS — Z720 Tobacco use: Secondary | ICD-10-CM

## 2011-04-26 DIAGNOSIS — R0602 Shortness of breath: Secondary | ICD-10-CM

## 2011-04-26 DIAGNOSIS — R609 Edema, unspecified: Secondary | ICD-10-CM

## 2011-04-26 DIAGNOSIS — F32A Depression, unspecified: Secondary | ICD-10-CM | POA: Insufficient documentation

## 2011-04-26 DIAGNOSIS — D496 Neoplasm of unspecified behavior of brain: Secondary | ICD-10-CM

## 2011-04-26 DIAGNOSIS — I10 Essential (primary) hypertension: Secondary | ICD-10-CM

## 2011-04-26 NOTE — Patient Instructions (Signed)
   Echo If the results of your test are normal or stable, you will receive a letter.  If they are abnormal, the nurse will contact you by phone. Follow up as needed  

## 2011-04-26 NOTE — Progress Notes (Signed)
HPI  The patient is seen today for preop cardiovascular clearance for knee surgery. There is no documented prior coronary disease. The patient however has hypertension and she smokes. In addition she has mild edema. She has been on a diuretic. I do not have any data concerning any prior echocardiograms assessing her LV function. Her overall exercise level is limited because of her knee problem. She's not having chest pain. She has no syncope or presyncope. There is no recent myocardial infarction or significant arrhythmias. There is no recent documented significant congestive heart failure.  No Known Allergies  Current Outpatient Prescriptions  Medication Sig Dispense Refill  . ALPRAZolam (XANAX) 1 MG tablet Take 1 mg by mouth 3 (three) times daily as needed. anxiety      . B Complex Vitamins (VITAMIN B COMPLEX PO) Take 1 tablet by mouth daily.        . buprenorphine-naloxone (SUBOXONE) 2-0.5 MG SUBL Place 1 tablet under the tongue 3 (three) times daily.       Marland Kitchen buPROPion (WELLBUTRIN SR) 150 MG 12 hr tablet Take 150 mg by mouth 2 (two) times daily.       . chlorproMAZINE (THORAZINE) 25 MG tablet Take 25 mg by mouth at bedtime.        . Coenzyme Q10 (CO Q 10 PO) Take 1 tablet by mouth daily.        Marland Kitchen doxepin (SINEQUAN) 25 MG capsule Take 25 mg by mouth at bedtime.        . fish oil-omega-3 fatty acids 1000 MG capsule Take 1 g by mouth daily.        . furosemide (LASIX) 20 MG tablet Take 20 mg by mouth daily.        . methylphenidate (RITALIN) 10 MG tablet Take 10 mg by mouth 3 (three) times daily.        Marland Kitchen tiZANidine (ZANAFLEX) 2 MG tablet Take 2 mg by mouth at bedtime.          History   Social History  . Marital Status: Single    Spouse Name: N/A    Number of Children: N/A  . Years of Education: N/A   Occupational History  . Not on file.   Social History Main Topics  . Smoking status: Current Everyday Smoker -- 1.0 packs/day for 10 years    Types: Cigarettes  . Smokeless tobacco:  Never Used  . Alcohol Use: No  . Drug Use: No  . Sexually Active: Not on file   Other Topics Concern  . Not on file   Social History Narrative  . No narrative on file    No family history on file.  Past Medical History  Diagnosis Date  . Depression   . Brain tumor     Surgery for benign brain tumor in the remote past  . Hypertension   . Renal disorder   . Cancer   . Preop cardiovascular exam     Cardiac clearance for knee surgery, January, 2013  . Edema   . Tobacco abuse     Past Surgical History  Procedure Date  . Abdominal hysterectomy   . Knee surgery   . Nose surgery   . Brain surgery     pituitary tumor  . Back surgery     pt has stimulator in lower back and can't have an MRI    ROS   Patient denies fever, chills, headache, sweats, rash, change in vision, change in hearing, chest pain, cough, nausea  vomiting, urinary symptoms. She's had some neck surgery in the past and says that she still has some decreased sensation in some fingers in her left hand. All other systems are reviewed and are negative.  PHYSICAL EXAM  Patient is walking with a cane because of her knee. She is oriented to person time and place. Affect is normal. Head is atraumatic. There is no xanthelasma. There is no jugulovenous distention. There are no carotid bruits. Lungs are clear. Respiratory effort is nonlabored. Cardiac exam reveals S1 and S2. There no clicks or significant murmurs. The patient is overweight. The abdomen is soft. There is trace peripheral edema. There is mild swelling of the left knee. There no skin rashes.  Filed Vitals:   04/26/11 1018  BP: 140/92  Pulse: 91  Height: 5\' 1"  (1.549 m)  Weight: 172 lb (78.019 kg)  SpO2: 98%    EKG EKG is done today and reviewed by me. The EKG is normal.  ASSESSMENT & PLAN

## 2011-04-26 NOTE — Assessment & Plan Note (Signed)
Diastolic blood pressure is slightly elevated today. No change in therapy.

## 2011-04-26 NOTE — Assessment & Plan Note (Signed)
I have counseled the patient to stop smoking 

## 2011-04-26 NOTE — Assessment & Plan Note (Signed)
The patient does have some mild edema. There is no definite proof of heart failure. She will have a 2-D echo done.

## 2011-04-26 NOTE — Assessment & Plan Note (Signed)
The patient needs knee surgery which is moderate risk. She does not have proven coronary disease. She does have some mild edema. It will be important to be sure that she has normal left ventricular function. Two-dimensional echo will be scheduled to assess her LV and her valves. If this study shows no significant abnormalities she can be cleared for her knee surgery. She does not need any type of exercise testing.

## 2011-05-10 ENCOUNTER — Other Ambulatory Visit (INDEPENDENT_AMBULATORY_CARE_PROVIDER_SITE_OTHER): Payer: Medicare Other | Admitting: *Deleted

## 2011-05-10 ENCOUNTER — Other Ambulatory Visit: Payer: Self-pay

## 2011-05-10 DIAGNOSIS — R609 Edema, unspecified: Secondary | ICD-10-CM

## 2011-05-10 DIAGNOSIS — R0602 Shortness of breath: Secondary | ICD-10-CM

## 2011-05-10 DIAGNOSIS — Z01810 Encounter for preprocedural cardiovascular examination: Secondary | ICD-10-CM

## 2011-05-12 ENCOUNTER — Encounter: Payer: Self-pay | Admitting: Cardiology

## 2011-05-12 DIAGNOSIS — R9430 Abnormal result of cardiovascular function study, unspecified: Secondary | ICD-10-CM | POA: Insufficient documentation

## 2011-05-29 ENCOUNTER — Telehealth: Payer: Self-pay | Admitting: Cardiology

## 2011-05-29 NOTE — Telephone Encounter (Signed)
Echo fxed to [facility] @ [phone] 05/29/11/KM

## 2011-06-30 ENCOUNTER — Other Ambulatory Visit: Payer: Self-pay

## 2011-06-30 ENCOUNTER — Inpatient Hospital Stay (HOSPITAL_COMMUNITY)
Admission: EM | Admit: 2011-06-30 | Discharge: 2011-07-02 | DRG: 392 | Disposition: A | Discharge status: Home / Self Care | Payer: Medicare Other | Attending: Internal Medicine | Admitting: Internal Medicine

## 2011-06-30 ENCOUNTER — Encounter (HOSPITAL_COMMUNITY): Payer: Self-pay | Admitting: Emergency Medicine

## 2011-06-30 ENCOUNTER — Emergency Department (HOSPITAL_COMMUNITY): Payer: Medicare Other

## 2011-06-30 DIAGNOSIS — R609 Edema, unspecified: Secondary | ICD-10-CM | POA: Diagnosis present

## 2011-06-30 DIAGNOSIS — Z01810 Encounter for preprocedural cardiovascular examination: Secondary | ICD-10-CM

## 2011-06-30 DIAGNOSIS — G4733 Obstructive sleep apnea (adult) (pediatric): Secondary | ICD-10-CM | POA: Diagnosis present

## 2011-06-30 DIAGNOSIS — R7401 Elevation of levels of liver transaminase levels: Secondary | ICD-10-CM | POA: Diagnosis present

## 2011-06-30 DIAGNOSIS — R7402 Elevation of levels of lactic acid dehydrogenase (LDH): Secondary | ICD-10-CM | POA: Diagnosis present

## 2011-06-30 DIAGNOSIS — R9430 Abnormal result of cardiovascular function study, unspecified: Secondary | ICD-10-CM

## 2011-06-30 DIAGNOSIS — F32A Depression, unspecified: Secondary | ICD-10-CM | POA: Diagnosis present

## 2011-06-30 DIAGNOSIS — I5030 Unspecified diastolic (congestive) heart failure: Secondary | ICD-10-CM | POA: Diagnosis present

## 2011-06-30 DIAGNOSIS — Z9989 Dependence on other enabling machines and devices: Secondary | ICD-10-CM

## 2011-06-30 DIAGNOSIS — D496 Neoplasm of unspecified behavior of brain: Secondary | ICD-10-CM

## 2011-06-30 DIAGNOSIS — Z720 Tobacco use: Secondary | ICD-10-CM

## 2011-06-30 DIAGNOSIS — E872 Acidosis, unspecified: Secondary | ICD-10-CM | POA: Diagnosis present

## 2011-06-30 DIAGNOSIS — K8020 Calculus of gallbladder without cholecystitis without obstruction: Secondary | ICD-10-CM | POA: Diagnosis present

## 2011-06-30 DIAGNOSIS — R112 Nausea with vomiting, unspecified: Principal | ICD-10-CM | POA: Diagnosis present

## 2011-06-30 DIAGNOSIS — I10 Essential (primary) hypertension: Secondary | ICD-10-CM | POA: Diagnosis present

## 2011-06-30 DIAGNOSIS — N289 Disorder of kidney and ureter, unspecified: Secondary | ICD-10-CM

## 2011-06-30 DIAGNOSIS — G4700 Insomnia, unspecified: Secondary | ICD-10-CM | POA: Diagnosis present

## 2011-06-30 DIAGNOSIS — F329 Major depressive disorder, single episode, unspecified: Secondary | ICD-10-CM | POA: Diagnosis present

## 2011-06-30 DIAGNOSIS — F3289 Other specified depressive episodes: Secondary | ICD-10-CM | POA: Diagnosis present

## 2011-06-30 HISTORY — DX: Sleep apnea, unspecified: G47.30

## 2011-06-30 LAB — URINALYSIS, ROUTINE W REFLEX MICROSCOPIC
Bilirubin Urine: NEGATIVE
Leukocytes, UA: NEGATIVE
Nitrite: NEGATIVE
Specific Gravity, Urine: 1.025 (ref 1.005–1.030)
Urobilinogen, UA: 0.2 mg/dL (ref 0.0–1.0)

## 2011-06-30 LAB — CBC
MCH: 28 pg (ref 26.0–34.0)
MCV: 84.9 fL (ref 78.0–100.0)
Platelets: 313 10*3/uL (ref 150–400)
RBC: 4.97 MIL/uL (ref 3.87–5.11)
RDW: 13.9 % (ref 11.5–15.5)

## 2011-06-30 LAB — URINE MICROSCOPIC-ADD ON

## 2011-06-30 LAB — DIFFERENTIAL
Basophils Absolute: 0.1 10*3/uL (ref 0.0–0.1)
Basophils Relative: 0 % (ref 0–1)
Eosinophils Absolute: 0.1 10*3/uL (ref 0.0–0.7)
Eosinophils Relative: 1 % (ref 0–5)
Neutrophils Relative %: 82 % — ABNORMAL HIGH (ref 43–77)

## 2011-06-30 LAB — PROCALCITONIN: Procalcitonin: <0.1 ng/mL

## 2011-06-30 LAB — COMPREHENSIVE METABOLIC PANEL
ALT: 21 U/L (ref 0–35)
AST: 32 U/L (ref 0–37)
Albumin: 4.3 g/dL (ref 3.5–5.2)
Alkaline Phosphatase: 170 U/L — ABNORMAL HIGH (ref 39–117)
Calcium: 10.3 mg/dL (ref 8.4–10.5)
GFR calc Af Amer: 68 mL/min — ABNORMAL LOW (ref >90)
Potassium: 3.5 mEq/L (ref 3.5–5.1)
Sodium: 141 mEq/L (ref 135–145)
Total Protein: 9.4 g/dL — ABNORMAL HIGH (ref 6.0–8.3)

## 2011-06-30 LAB — TROPONIN I: Troponin I: <0.3 ng/mL (ref <0.30)

## 2011-06-30 MED ORDER — ONDANSETRON HCL 4 MG/2ML IJ SOLN
4.0000 mg | Freq: Once | INTRAMUSCULAR | Status: AC
  Administered 2011-06-30: 4 mg via INTRAVENOUS
  Filled 2011-06-30: qty 2

## 2011-06-30 MED ORDER — PROMETHAZINE HCL 25 MG/ML IJ SOLN
12.5000 mg | Freq: Once | INTRAMUSCULAR | Status: AC
  Administered 2011-06-30: 12.5 mg via INTRAVENOUS
  Filled 2011-06-30: qty 1

## 2011-06-30 MED ORDER — SODIUM CHLORIDE 0.9 % IV SOLN
INTRAVENOUS | Status: DC
  Administered 2011-06-30: 500 mL via INTRAVENOUS

## 2011-06-30 MED ORDER — ONDANSETRON HCL 4 MG/2ML IJ SOLN
4.0000 mg | INTRAMUSCULAR | Status: AC | PRN
  Administered 2011-06-30 (×2): 4 mg via INTRAVENOUS
  Filled 2011-06-30 (×2): qty 2

## 2011-06-30 MED ORDER — IOHEXOL 300 MG/ML  SOLN
100.0000 mL | Freq: Once | INTRAMUSCULAR | Status: AC | PRN
  Administered 2011-06-30: 100 mL via INTRAVENOUS

## 2011-06-30 MED ORDER — SODIUM CHLORIDE 0.9 % IV SOLN: INTRAVENOUS | Status: DC

## 2011-06-30 MED ORDER — PROMETHAZINE HCL 25 MG/ML IJ SOLN
12.5000 mg | INTRAMUSCULAR | Status: DC | PRN
  Administered 2011-07-01 (×2): 12.5 mg via INTRAVENOUS
  Filled 2011-06-30 (×2): qty 1

## 2011-06-30 MED ORDER — FLEET ENEMA 7-19 GM/118ML RE ENEM: 1.0000 | ENEMA | Freq: Once | RECTAL | Status: AC | PRN

## 2011-06-30 MED ORDER — ONDANSETRON HCL 4 MG/2ML IJ SOLN
4.0000 mg | INTRAMUSCULAR | Status: DC | PRN
  Administered 2011-06-30 – 2011-07-01 (×2): 4 mg via INTRAVENOUS
  Filled 2011-06-30 (×2): qty 2

## 2011-06-30 MED ORDER — ONDANSETRON HCL 4 MG/2ML IJ SOLN: 4.0000 mg | INTRAMUSCULAR | Status: DC | PRN

## 2011-06-30 MED ORDER — PANTOPRAZOLE SODIUM 40 MG IV SOLR
40.0000 mg | Freq: Every day | INTRAVENOUS | Status: DC
  Administered 2011-06-30: 40 mg via INTRAVENOUS
  Filled 2011-06-30: qty 40

## 2011-06-30 MED ORDER — ENOXAPARIN SODIUM 40 MG/0.4ML ~~LOC~~ SOLN
40.0000 mg | SUBCUTANEOUS | Status: DC
  Administered 2011-06-30 – 2011-07-01 (×2): 40 mg via SUBCUTANEOUS
  Filled 2011-06-30 (×2): qty 0.4

## 2011-06-30 MED ORDER — ONDANSETRON HCL 4 MG/2ML IJ SOLN: 4.0000 mg | Freq: Three times a day (TID) | INTRAMUSCULAR | Status: DC | PRN

## 2011-06-30 MED ORDER — POTASSIUM CHLORIDE IN NACL 20-0.9 MEQ/L-% IV SOLN
INTRAVENOUS | Status: DC
  Administered 2011-06-30 – 2011-07-01 (×2): via INTRAVENOUS

## 2011-06-30 MED ORDER — HYDROMORPHONE HCL PF 1 MG/ML IJ SOLN: 0.5000 mg | INTRAMUSCULAR | Status: DC | PRN

## 2011-06-30 MED ORDER — LORAZEPAM 2 MG/ML IJ SOLN
1.0000 mg | Freq: Four times a day (QID) | INTRAMUSCULAR | Status: DC | PRN
  Administered 2011-06-30: 1 mg via INTRAVENOUS
  Filled 2011-06-30: qty 1

## 2011-06-30 MED ORDER — BISACODYL 10 MG RE SUPP: 10.0000 mg | Freq: Every day | RECTAL | Status: DC | PRN

## 2011-06-30 NOTE — ED Provider Notes (Signed)
History     CSN: [id number]  Arrival date & time 06/30/11  1320   First MD Initiated Contact with Patient 06/30/11 1333      Chief Complaint  Patient presents with  . Fatigue    HPI Pt was seen at 1345.  Per pt, c/o gradual onset and persistence of constant generalized fatigue and weakness that began 4 days ago.  Pt states she "feels like I can't sleep" with her sleep apnea machine on for the past several days.  Pt also c/o multiple intermittent episodes of N/V and decreased appetite for the past 3 days.  Denies diarrhea, no black or blood in emesis, no fevers, no back pain, no abd pain, no CP/SOB, no cough.    Past Medical History  Diagnosis Date  . Depression   . Brain tumor     Surgery for benign brain tumor in the remote past  . Hypertension   . Renal disorder   . Cancer   . Preop cardiovascular exam     Cardiac clearance for knee surgery, January, 2013  . Edema   . Tobacco abuse   . Ejection fraction     EF 70%, echo, February, 2013  . Sleep apnea     Past Surgical History  Procedure Date  . Abdominal hysterectomy   . Knee surgery   . Nose surgery   . Brain surgery     pituitary tumor  . Back surgery     pt has stimulator in lower back and can't have an MRI    Family History  Problem Relation Age of Onset  . Cancer Mother   . Hypertension Mother     History  Substance Use Topics  . Smoking status: Current Everyday Smoker -- 0.5 packs/day for 10 years    Types: Cigarettes  . Smokeless tobacco: Never Used  . Alcohol Use: No    OB History    Grav Para Term Preterm Abortions TAB SAB Ect Mult Living   1 1 1       1       Review of Systems ROS: Statement: All systems negative except as marked or noted in the HPI; Constitutional: Negative for fever and chills. ; ; Eyes: Negative for eye pain, redness and discharge. ; ; ENMT: Negative for ear pain, hoarseness, nasal congestion, sinus pressure and sore throat. ; ; Cardiovascular: Negative for chest pain,  palpitations, diaphoresis, dyspnea and peripheral edema. ; ; Respiratory: Negative for cough, wheezing and stridor. ; ; Gastrointestinal: +N/V.  Negative for diarrhea, abdominal pain, blood in stool, hematemesis, jaundice and rectal bleeding. . ; ; Genitourinary: Negative for dysuria, flank pain and hematuria. ; ; Musculoskeletal: Negative for back pain and neck pain. Negative for swelling and trauma.; ; Skin: Negative for pruritus, rash, abrasions, blisters, bruising and skin lesion.; ; Neuro: +fatigue.  egative for headache, lightheadedness and neck stiffness. Negative for weakness, altered level of consciousness , altered mental status, extremity weakness, paresthesias, involuntary movement, seizure and syncope.     Allergies  Review of patient's allergies indicates no known allergies.  Home Medications   Current Outpatient Rx  Name Route Sig Dispense Refill  . ALPRAZOLAM 1 MG PO TABS Oral Take 1 mg by mouth 3 (three) times daily as needed. anxiety    . AMLODIPINE BESYLATE 10 MG PO TABS Oral Take 10 mg by mouth daily.    Marland Kitchen VITAMIN B COMPLEX PO Oral Take 1 tablet by mouth daily.      Marland Kitchen BUPRENORPHINE  HCL-NALOXONE HCL 2-0.5 MG SL SUBL Sublingual Place 1 tablet under the tongue 3 (three) times daily.     . BUPROPION HCL ER (SR) 150 MG PO TB12 Oral Take 150 mg by mouth 2 (two) times daily.     . CHLORPROMAZINE HCL 25 MG PO TABS Oral Take 25 mg by mouth at bedtime.      . CO Q 10 PO Oral Take 1 tablet by mouth daily.      . FUROSEMIDE 20 MG PO TABS Oral Take 20 mg by mouth daily.      Marland Kitchen LISINOPRIL 10 MG PO TABS Oral Take 10 mg by mouth daily.    . METHYLPHENIDATE HCL 10 MG PO TABS Oral Take 10 mg by mouth 2 (two) times daily.       BP 116/75  Pulse 91  Temp(Src) 98.8 F (37.1 C) (Oral)  Resp 17  Ht 5\' 1"  (1.549 m)  Wt 165 lb (74.844 kg)  BMI 31.18 kg/m2  SpO2 98%  Physical Exam 1350: Physical examination:  Nursing notes reviewed; Vital signs and O2 SAT reviewed;  Constitutional: Well  developed, Well nourished, Well hydrated, In no acute distress; Head:  Normocephalic, atraumatic; Eyes: EOMI, PERRL, No scleral icterus; ENMT: Mouth and pharynx normal, Mucous membranes moist; Neck: Supple, Full range of motion, No lymphadenopathy; Cardiovascular: Regular rate and rhythm, No murmur, rub, or gallop; Respiratory: Breath sounds clear & equal bilaterally, No rales, rhonchi, wheezes, or rub, Normal respiratory effort/excursion; Chest: Nontender, Movement normal; Abdomen: Soft, Nontender, Nondistended, Normal bowel sounds; Extremities: Pulses normal, No tenderness, No edema, No calf edema or asymmetry.; Neuro: AA&Ox3, Major CN grossly intact. Speech clear, no facial droop.  No gross focal motor or sensory deficits in extremities.; Skin: Color normal, Warm, Dry, no rash.    ED Course  Procedures    MDM  MDM Reviewed: nursing note and vitals Reviewed previous: ECG Interpretation: labs, x-ray and ECG    Date: 06/30/2011  Rate: 84  Rhythm: normal sinus rhythm  QRS Axis: normal  Intervals: normal  ST/T Wave abnormalities: normal  Conduction Disutrbances:none  Narrative Interpretation:   Old EKG Reviewed: unchanged; no significant changes from previous EKG dated 04/23/2008.   Results for orders placed during the hospital encounter of 06/30/11  CBC      Component Value Range   WBC 12.9 (*) 4.0 - 10.5 (K/uL)   RBC 4.97  3.87 - 5.11 (MIL/uL)   Hemoglobin 13.9  12.0 - 15.0 (g/dL)   HCT 14.7  82.9 - 56.2 (%)   MCV 84.9  78.0 - 100.0 (fL)   MCH 28.0  26.0 - 34.0 (pg)   MCHC 32.9  30.0 - 36.0 (g/dL)   RDW 13.0  86.5 - 78.4 (%)   Platelets 313  150 - 400 (K/uL)  DIFFERENTIAL      Component Value Range   Neutrophils Relative 82 (*) 43 - 77 (%)   Neutro Abs 10.6 (*) 1.7 - 7.7 (K/uL)   Lymphocytes Relative 11 (*) 12 - 46 (%)   Lymphs Abs 1.4  0.7 - 4.0 (K/uL)   Monocytes Relative 6  3 - 12 (%)   Monocytes Absolute 0.8  0.1 - 1.0 (K/uL)   Eosinophils Relative 1  0 - 5 (%)    Eosinophils Absolute 0.1  0.0 - 0.7 (K/uL)   Basophils Relative 0  0 - 1 (%)   Basophils Absolute 0.1  0.0 - 0.1 (K/uL)  COMPREHENSIVE METABOLIC PANEL      Component Value  Range   Sodium 141  135 - 145 (mEq/L)   Potassium 3.5  3.5 - 5.1 (mEq/L)   Chloride 98  96 - 112 (mEq/L)   CO2 28  19 - 32 (mEq/L)   Glucose, Bld 131 (*) 70 - 99 (mg/dL)   BUN 15  6 - 23 (mg/dL)   Creatinine, Ser 9.60  0.50 - 1.10 (mg/dL)   Calcium 45.4  8.4 - 10.5 (mg/dL)   Total Protein 9.4 (*) 6.0 - 8.3 (g/dL)   Albumin 4.3  3.5 - 5.2 (g/dL)   AST 32  0 - 37 (U/L)   ALT 21  0 - 35 (U/L)   Alkaline Phosphatase 170 (*) 39 - 117 (U/L)   Total Bilirubin 0.3  0.3 - 1.2 (mg/dL)   GFR calc non Af Amer 59 (*) >90 (mL/min)   GFR calc Af Amer 68 (*) >90 (mL/min)  LIPASE, BLOOD      Component Value Range   Lipase 35  11 - 59 (U/L)  LACTIC ACID, PLASMA      Component Value Range   Lactic Acid, Venous 3.7 (*) 0.5 - 2.2 (mmol/L)  PROCALCITONIN      Component Value Range   Procalcitonin <0.10    TROPONIN I      Component Value Range   Troponin I <0.30  <0.30 (ng/mL)  URINALYSIS, ROUTINE W REFLEX MICROSCOPIC      Component Value Range   Color, Urine YELLOW  YELLOW    APPearance CLEAR  CLEAR    Specific Gravity, Urine 1.025  1.005 - 1.030    pH 6.0  5.0 - 8.0    Glucose, UA NEGATIVE  NEGATIVE (mg/dL)   Hgb urine dipstick SMALL (*) NEGATIVE    Bilirubin Urine NEGATIVE  NEGATIVE    Ketones, ur NEGATIVE  NEGATIVE (mg/dL)   Protein, ur 098 (*) NEGATIVE (mg/dL)   Urobilinogen, UA 0.2  0.0 - 1.0 (mg/dL)   Nitrite NEGATIVE  NEGATIVE    Leukocytes, UA NEGATIVE  NEGATIVE   URINE MICROSCOPIC-ADD ON      Component Value Range   Squamous Epithelial / LPF RARE  RARE    WBC, UA 0-2  <3 (WBC/hpf)   RBC / HPF 0-2  <3 (RBC/hpf)   Bacteria, UA FEW (*) RARE    Casts HYALINE CASTS (*) NEGATIVE    Urine-Other MUCOUS PRESENT     Dg Chest 2 View 06/30/2011  *RADIOLOGY REPORT*  Clinical Data: Weakness.  Smoker.  CHEST - 2 VIEW   Comparison: Plain films of the chest 04/23/2008.  Findings: The lungs are clear.  Heart size is upper normal.  No pneumothorax or pleural effusion.  No focal bony abnormality. Spinal stimulator device noted.  IMPRESSION: No acute disease.  Original Report Authenticated By: Bernadene Bell. Maricela Curet, M.D.   Ct Abdomen Pelvis W Contrast 06/30/2011  *RADIOLOGY REPORT*  Clinical Data: Nausea, vomiting, fatigue, decreased appetite and weakness, history of renal carcinoma  CT ABDOMEN AND PELVIS WITH CONTRAST  Technique:  Multidetector CT imaging of the abdomen and pelvis was performed following the standard protocol during bolus administration of intravenous contrast.  Contrast: OMNIPAQUE IOHEXOL 300 MG/ML  SOLN  Comparison: CT abdomen pelvis of 08/20/2004.  Findings: The lung bases are clear.  The liver enhances with no focal abnormality and no ductal dilatation is seen.  There is a large gallstone within the gallbladder measuring 3.5 cm in maximum diameter containing some gas centrally.  The gallbladder wall is slightly prominent and ultrasound may  be helpful to assess for developing acute cholecystitis.  No ductal dilatation is seen.  The pancreas is normal in size and the pancreatic duct is not dilated. The adrenal glands and spleen are unremarkable.  The stomach is decompressed and cannot be evaluated.  The kidneys enhance with no calculus or hydronephrosis.  The previously noted low attenuation mass in the lower pole of the right kidney medially is relatively stable measuring 13 mm in maximum diameter. This low attenuation right renal lesion measures 24 HU on the portal venous phase, and 12 HU on the delayed images.  The abdominal aorta is normal in caliber with mild atheromatous change present.  Neurostimulator electrodes are noted in the lower thoracic spinal canal.  No adenopathy is seen.  The urinary bladder is not well distended.  The uterus has previously been resected.  No adnexal lesion is seen.  No fluid  is noted within the pelvis.  The colon is largely decompressed and difficult to evaluate.  No definite mass is seen.  The appendix and terminal ileum are unremarkable. Lumbar spine fusion is noted at the L4-5 level.  IMPRESSION:  1.  3.5 cm gallstone within the gallbladder.  The gallbladder wall is slightly irregular.  Cannot exclude developing acute cholecystitis. 2.  No significant change in low attenuation lower pole right renal lesion when compared to the CT from 2006. 3.  The appendix and terminal ileum are unremarkable.  Original Report Authenticated By: Juline Patch, M.D.     1755:  Pt continues to c/o N/V despite multiple doses of IV zofran.  Has been unable to tol PO contrast due to N/V.  Dx testing d/w pt and family.  Questions answered.  Verb understanding, agreeable to admit.  T/C to [facility] Dr. Kerry Hough, case discussed, including:  HPI, pertinent PM/SHx, VS/PE, dx testing, ED course and treatment:  Agreeable to admit, requests to write temporary orders, obtain regular bed to team 2.           Laray Anger, DO 07/02/11 2338

## 2011-06-30 NOTE — ED Notes (Signed)
Pt refuses to drink contrast, says she knows it will come back up.  EDP notified and instructed to send to ct

## 2011-06-30 NOTE — ED Notes (Signed)
Pt had vomited prior to phenergan being given.  Called report to 3rd floor but room not ready.

## 2011-06-30 NOTE — ED Notes (Signed)
Patient brought in via EMS from home for generalized weakness. Patient alert and oriented. Airway patent. Patient reports having sleep apnea, and states "I haven't really slept since Monday." Reports not eating since Tuesday. Per patient nausea and vomiting. Denies diarrhea. Patient reports drinking water well.

## 2011-06-30 NOTE — ED Notes (Signed)
Pt refused in and out cath.  Dr. Clarene Duke aware.  Pt voided in female urinal.

## 2011-06-30 NOTE — H&P (Signed)
PCP:   Kirstie Peri, MD, MD   Psychiatrist: Liz Malady, MD   Chief Complaint:    nausea,vomiting x2   insomnia x4  HPI: Rebecca Richards is an 66 y.o. female.  obese African American lady with a history of major depression, recently started on Ritalin 4 days at a dose of 20 mg twice, and since then has not slept. In addition patient says she's had persistent nausea and vomiting and cannot keep down anything except very small sips of water with her she eats comes up in the vomit denies any blood or bilious.   She visited her psychiatrist today who advised her to stop all psychiatric medication andgo to the emergency room.  He denies abdominal pain or swelling, feve,r diarrhea or constipation.  In the emergency room patient was not even able to keep down contrast material, and the nausea and vomiting persisted despite pharmacotherapy hospitalist service was called to assist with management.   She has hypertension for which she takes amlodipine; she has lower extremity edema for which she takes Lasix. She did at one time have a 2-D echo which showed excellent fraction of 70% and grade 1 diastolic dysfunction.  Rewiew of Systems:  The patient denies anorexia, fever, weight loss,, vision loss, decreased hearing, hoarseness, chest pain, syncope, dyspnea on exertion, balance deficits, hemoptysis, abdominal pain, melena, hematochezia, severe indigestion/heartburn, hematuria, incontinence, genital sores, muscle weakness, suspicious skin lesions, transient blindness, difficulty walking, unusual weight change, abnormal bleeding, enlarged lymph nodes, angioedema, and breast masses.    Past Medical History  Diagnosis Date  . Depression   . Brain tumor     Surgery for benign brain tumor in the remote past  . Hypertension   . Renal disorder   . Cancer   . Preop cardiovascular exam     Cardiac clearance for knee surgery, January, 2013  . Edema   . Tobacco abuse   . Ejection fraction    EF 70%, echo, February, 2013  . Sleep apnea     Past Surgical History  Procedure Date  . Abdominal hysterectomy   . Knee surgery   . Nose surgery   . Brain surgery     pituitary tumor  . Back surgery     pt has stimulator in lower back and can't have an MRI    Medications:  HOME MEDS: Prior to Admission medications   Medication Sig Start Date End Date Taking? Authorizing Provider  ALPRAZolam Prudy Feeler) 1 MG tablet Take 1 mg by mouth 3 (three) times daily as needed. anxiety   Yes Historical Provider, MD  amLODipine (NORVASC) 10 MG tablet Take 10 mg by mouth daily.   Yes Historical Provider, MD  B Complex Vitamins (VITAMIN B COMPLEX PO) Take 1 tablet by mouth daily.     Yes Historical Provider, MD  buprenorphine-naloxone (SUBOXONE) 2-0.5 MG SUBL Place 1 tablet under the tongue 3 (three) times daily.    Yes Historical Provider, MD  buPROPion (WELLBUTRIN SR) 150 MG 12 hr tablet Take 150 mg by mouth 2 (two) times daily.  09/20/10  Yes Historical Provider, MD  chlorproMAZINE (THORAZINE) 25 MG tablet Take 25 mg by mouth at bedtime.     Yes Historical Provider, MD  Coenzyme Q10 (CO Q 10 PO) Take 1 tablet by mouth daily.     Yes Historical Provider, MD  furosemide (LASIX) 20 MG tablet Take 20 mg by mouth daily.     Yes Historical Provider, MD  lisinopril (PRINIVIL,ZESTRIL) 10 MG tablet  Take 10 mg by mouth daily.   Yes Historical Provider, MD  methylphenidate (RITALIN) 10 MG tablet Take 10 mg by mouth 2 (two) times daily.    Yes Historical Provider, MD     Allergies:  No Known Allergies  Social History:   reports that she has been smoking Cigarettes.  She has a 5 pack-year smoking history. She has never used smokeless tobacco. She reports that she does not drink alcohol or use illicit drugs.  Family History: Family History  Problem Relation Age of Onset  . Cancer Mother   . Hypertension Mother      Physical Exam: Filed Vitals:   06/30/11 1600 06/30/11 1812 06/30/11 1847 06/30/11  2004  BP: 158/95 161/100 172/97 156/93  Pulse: 88 85 84 83  Temp:  98.1 F (36.7 C)  98.2 F (36.8 C)  TempSrc:  Oral  Oral  Resp: 18 18  18   Height:    5\' 1"  (1.549 m)  Weight:    76.023 kg (167 lb 9.6 oz)  SpO2: 100% 99%  98%   Blood pressure 156/93, pulse 83, temperature 98.2 F (36.8 C), temperature source Oral, resp. rate 18, height 5\' 1"  (1.549 m), weight 76.023 kg (167 lb 9.6 oz), SpO2 98.00%.  GEN:  Pleasant  African American lady lying in the stretcher; cooperative with exam PSYCH:  alert and oriented x4; appears anxious. HEENT: Mucous membranes pink dry  and anicteric; PERRLA; EOM intact; no cervical lymphadenopathy nor thyromegaly  no JVD; Breasts:: Not examined CHEST WALL: No tenderness CHEST: Normal respiration, clear to auscultation bilaterally HEART: Regular rate and rhythm; no murmurs rubs or gallops BACK: No kyphosis or scoliosis; no CVA tenderness ABDOMEN: Obese, soft non-tender; no masses, no organomegaly, normal abdominal bowel sounds; no intertriginous candida. Rectal Exam: Not done EXTREMITIES:; age-appropriate arthropathy of the hands and knees; no edema; no ulcerations. Genitalia: not examined PULSES: 2+ and symmetric SKIN: Normal hydration no rash or ulceration CNS: Cranial nerves 2-12 grossly intact no focal lateralizing neurologic deficit   Labs & Imaging Results for orders placed during the hospital encounter of 06/30/11 (from the past 48 hour(s))  CBC     Status: Abnormal   Collection Time   06/30/11  1:48 PM      Component Value Range Comment   WBC 12.9 (*) 4.0 - 10.5 (K/uL)    RBC 4.97  3.87 - 5.11 (MIL/uL)    Hemoglobin 13.9  12.0 - 15.0 (g/dL)    HCT 16.1  09.6 - 04.5 (%)    MCV 84.9  78.0 - 100.0 (fL)    MCH 28.0  26.0 - 34.0 (pg)    MCHC 32.9  30.0 - 36.0 (g/dL)    RDW 40.9  81.1 - 91.4 (%)    Platelets 313  150 - 400 (K/uL)   DIFFERENTIAL     Status: Abnormal   Collection Time   06/30/11  1:48 PM      Component Value Range Comment    Neutrophils Relative 82 (*) 43 - 77 (%)    Neutro Abs 10.6 (*) 1.7 - 7.7 (K/uL)    Lymphocytes Relative 11 (*) 12 - 46 (%)    Lymphs Abs 1.4  0.7 - 4.0 (K/uL)    Monocytes Relative 6  3 - 12 (%)    Monocytes Absolute 0.8  0.1 - 1.0 (K/uL)    Eosinophils Relative 1  0 - 5 (%)    Eosinophils Absolute 0.1  0.0 - 0.7 (K/uL)  Basophils Relative 0  0 - 1 (%)    Basophils Absolute 0.1  0.0 - 0.1 (K/uL)   COMPREHENSIVE METABOLIC PANEL     Status: Abnormal   Collection Time   06/30/11  1:48 PM      Component Value Range Comment   Sodium 141  135 - 145 (mEq/L)    Potassium 3.5  3.5 - 5.1 (mEq/L)    Chloride 98  96 - 112 (mEq/L)    CO2 28  19 - 32 (mEq/L)    Glucose, Bld 131 (*) 70 - 99 (mg/dL)    BUN 15  6 - 23 (mg/dL)    Creatinine, Ser 1.61  0.50 - 1.10 (mg/dL)    Calcium 09.6  8.4 - 10.5 (mg/dL)    Total Protein 9.4 (*) 6.0 - 8.3 (g/dL)    Albumin 4.3  3.5 - 5.2 (g/dL)    AST 32  0 - 37 (U/L)    ALT 21  0 - 35 (U/L)    Alkaline Phosphatase 170 (*) 39 - 117 (U/L)    Total Bilirubin 0.3  0.3 - 1.2 (mg/dL)    GFR calc non Af Amer 59 (*) >90 (mL/min)    GFR calc Af Amer 68 (*) >90 (mL/min)   LIPASE, BLOOD     Status: Normal   Collection Time   06/30/11  1:48 PM      Component Value Range Comment   Lipase 35  11 - 59 (U/L)   TROPONIN I     Status: Normal   Collection Time   06/30/11  1:48 PM      Component Value Range Comment   Troponin I <0.30  <0.30 (ng/mL)   PROCALCITONIN     Status: Normal   Collection Time   06/30/11  2:03 PM      Component Value Range Comment   Procalcitonin <0.10     LACTIC ACID, PLASMA     Status: Abnormal   Collection Time   06/30/11  2:05 PM      Component Value Range Comment   Lactic Acid, Venous 3.7 (*) 0.5 - 2.2 (mmol/L)   URINALYSIS, ROUTINE W REFLEX MICROSCOPIC     Status: Abnormal   Collection Time   06/30/11  3:11 PM      Component Value Range Comment   Color, Urine YELLOW  YELLOW     APPearance CLEAR  CLEAR     Specific Gravity, Urine 1.025  1.005  - 1.030     pH 6.0  5.0 - 8.0     Glucose, UA NEGATIVE  NEGATIVE (mg/dL)    Hgb urine dipstick SMALL (*) NEGATIVE     Bilirubin Urine NEGATIVE  NEGATIVE     Ketones, ur NEGATIVE  NEGATIVE (mg/dL)    Protein, ur 045 (*) NEGATIVE (mg/dL)    Urobilinogen, UA 0.2  0.0 - 1.0 (mg/dL)    Nitrite NEGATIVE  NEGATIVE     Leukocytes, UA NEGATIVE  NEGATIVE    URINE MICROSCOPIC-ADD ON     Status: Abnormal   Collection Time   06/30/11  3:11 PM      Component Value Range Comment   Squamous Epithelial / LPF RARE  RARE     WBC, UA 0-2  <3 (WBC/hpf)    RBC / HPF 0-2  <3 (RBC/hpf)    Bacteria, UA FEW (*) RARE     Casts HYALINE CASTS (*) NEGATIVE     Urine-Other MUCOUS PRESENT      Dg  Chest 2 View  06/30/2011  *RADIOLOGY REPORT*  Clinical Data: Weakness.  Smoker.  CHEST - 2 VIEW  Comparison: Plain films of the chest 04/23/2008.  Findings: The lungs are clear.  Heart size is upper normal.  No pneumothorax or pleural effusion.  No focal bony abnormality. Spinal stimulator device noted.  IMPRESSION: No acute disease.  Original Report Authenticated By: Bernadene Bell. Maricela Curet, M.D.   Ct Abdomen Pelvis W Contrast  06/30/2011  *RADIOLOGY REPORT*  Clinical Data: Nausea, vomiting, fatigue, decreased appetite and weakness, history of renal carcinoma  CT ABDOMEN AND PELVIS WITH CONTRAST  Technique:  Multidetector CT imaging of the abdomen and pelvis was performed following the standard protocol during bolus administration of intravenous contrast.  Contrast: OMNIPAQUE IOHEXOL 300 MG/ML  SOLN  Comparison: CT abdomen pelvis of 08/20/2004.  Findings: The lung bases are clear.  The liver enhances with no focal abnormality and no ductal dilatation is seen.  There is a large gallstone within the gallbladder measuring 3.5 cm in maximum diameter containing some gas centrally.  The gallbladder wall is slightly prominent and ultrasound may be helpful to assess for developing acute cholecystitis.  No ductal dilatation is seen.  The  pancreas is normal in size and the pancreatic duct is not dilated. The adrenal glands and spleen are unremarkable.  The stomach is decompressed and cannot be evaluated.  The kidneys enhance with no calculus or hydronephrosis.  The previously noted low attenuation mass in the lower pole of the right kidney medially is relatively stable measuring 13 mm in maximum diameter. This low attenuation right renal lesion measures 24 HU on the portal venous phase, and 12 HU on the delayed images.  The abdominal aorta is normal in caliber with mild atheromatous change present.  Neurostimulator electrodes are noted in the lower thoracic spinal canal.  No adenopathy is seen.  The urinary bladder is not well distended.  The uterus has previously been resected.  No adnexal lesion is seen.  No fluid is noted within the pelvis.  The colon is largely decompressed and difficult to evaluate.  No definite mass is seen.  The appendix and terminal ileum are unremarkable. Lumbar spine fusion is noted at the L4-5 level.  IMPRESSION:  1.  3.5 cm gallstone within the gallbladder.  The gallbladder wall is slightly irregular.  Cannot exclude developing acute cholecystitis. 2.  No significant change in low attenuation lower pole right renal lesion when compared to the CT from 2006. 3.  The appendix and terminal ileum are unremarkable.  Original Report Authenticated By: Juline Patch, M.D.      Assessment Present on Admission:   insomnia likely secondary to medications Acute gastritis possibly secondary to medication, Large gallstones possible cholecystitis, although there are no exam findings of cholecystitis .Nausea & vomiting .Depression .Hypertension . chronic Edema .Diastolic heart failure   PLAN:  admit this lady for hydration; give stomach sedatives and keep her n.p.o. until her stomach improves. Since she looks somewhat anxious will give benzodiazepines to assist with sleep, Phenergan to assist with nausea; if these don't  help her to sleep will give her trial dose of Haldol.   she will need an ultrasound to rule out cholecystitis this may not be urgent she becomes quickly asymptomatic.   Other plans as per orders  Hamlin Devine 06/30/2011, 8:43 PM

## 2011-06-30 NOTE — ED Notes (Signed)
Pt unable to tolerate contrast due to nausea, edp aware and zofran given

## 2011-07-01 DIAGNOSIS — G4700 Insomnia, unspecified: Secondary | ICD-10-CM | POA: Diagnosis present

## 2011-07-01 DIAGNOSIS — K8020 Calculus of gallbladder without cholecystitis without obstruction: Secondary | ICD-10-CM | POA: Diagnosis present

## 2011-07-01 DIAGNOSIS — Z9989 Dependence on other enabling machines and devices: Secondary | ICD-10-CM | POA: Diagnosis present

## 2011-07-01 LAB — RAPID URINE DRUG SCREEN, HOSP PERFORMED
Amphetamines: NOT DETECTED
Cocaine: NOT DETECTED
Opiates: NOT DETECTED
Tetrahydrocannabinol: NOT DETECTED

## 2011-07-01 LAB — COMPREHENSIVE METABOLIC PANEL
ALT: 17 U/L (ref 0–35)
Alkaline Phosphatase: 152 U/L — ABNORMAL HIGH (ref 39–117)
CO2: 27 mEq/L (ref 19–32)
GFR calc Af Amer: 68 mL/min — ABNORMAL LOW (ref >90)
GFR calc non Af Amer: 59 mL/min — ABNORMAL LOW (ref >90)
Glucose, Bld: 87 mg/dL (ref 70–99)
Potassium: 3.6 mEq/L (ref 3.5–5.1)
Sodium: 140 mEq/L (ref 135–145)
Total Bilirubin: 0.3 mg/dL (ref 0.3–1.2)

## 2011-07-01 LAB — CBC
HCT: 39.7 % (ref 36.0–46.0)
Hemoglobin: 12.9 g/dL (ref 12.0–15.0)
MCHC: 32.5 g/dL (ref 30.0–36.0)
WBC: 8.7 10*3/uL (ref 4.0–10.5)

## 2011-07-01 MED ORDER — HALOPERIDOL LACTATE 5 MG/ML IJ SOLN
5.0000 mg | Freq: Once | INTRAMUSCULAR | Status: AC
  Administered 2011-07-01: 5 mg via INTRAVENOUS
  Filled 2011-07-01: qty 1

## 2011-07-01 MED ORDER — CHLORPROMAZINE HCL 25 MG PO TABS
25.0000 mg | ORAL_TABLET | Freq: Every day | ORAL | Status: DC
  Administered 2011-07-01: 25 mg via ORAL
  Filled 2011-07-01 (×2): qty 1

## 2011-07-01 MED ORDER — LISINOPRIL 10 MG PO TABS
10.0000 mg | ORAL_TABLET | Freq: Every day | ORAL | Status: DC
  Administered 2011-07-01: 10 mg via ORAL
  Filled 2011-07-01: qty 1

## 2011-07-01 MED ORDER — PROMETHAZINE HCL 25 MG PO TABS: 12.5000 mg | ORAL_TABLET | Freq: Four times a day (QID) | ORAL | Status: DC | PRN

## 2011-07-01 MED ORDER — SODIUM CHLORIDE 0.9 % IJ SOLN
INTRAMUSCULAR | Status: AC
  Administered 2011-07-01: 14:00:00
  Filled 2011-07-01: qty 3

## 2011-07-01 MED ORDER — ALPRAZOLAM 1 MG PO TABS: 1.0000 mg | ORAL_TABLET | Freq: Three times a day (TID) | ORAL | Status: DC | PRN

## 2011-07-01 MED ORDER — SODIUM CHLORIDE 0.9 % IJ SOLN
INTRAMUSCULAR | Status: AC
  Administered 2011-07-01: 10 mL
  Filled 2011-07-01: qty 3

## 2011-07-01 MED ORDER — ALPRAZOLAM 1 MG PO TABS
1.0000 mg | ORAL_TABLET | Freq: Three times a day (TID) | ORAL | Status: DC | PRN
  Administered 2011-07-01: 1 mg via ORAL
  Filled 2011-07-01: qty 1

## 2011-07-01 MED ORDER — AMLODIPINE BESYLATE 5 MG PO TABS
10.0000 mg | ORAL_TABLET | Freq: Every day | ORAL | Status: DC
  Administered 2011-07-01: 10 mg via ORAL
  Filled 2011-07-01 (×2): qty 1

## 2011-07-01 NOTE — Progress Notes (Signed)
Patient ate <25% of meal. Post meal client had complaints of nausea. MD notified. Client not going to be discharged today. Nursing to continue to monitor patient.

## 2011-07-01 NOTE — Progress Notes (Signed)
Pt refused CPAP as she wears nasal prongs, her settings are 14.5

## 2011-07-01 NOTE — Progress Notes (Signed)
Subjective: Nausea improved, but didn't tolerate dinner. Chronic insomnia  Objective: Vital signs in last 24 hours: Filed Vitals:   07/01/11 0245 07/01/11 0549 07/01/11 1039 07/01/11 1329  BP: 158/91 153/91 171/98 165/112  Pulse: 97 97 85 73  Temp: 98.5 F (36.9 C) 97.8 F (36.6 C) 98.4 F (36.9 C) 98.1 F (36.7 C)  TempSrc: Oral Oral    Resp: 18 16 16 18   Height:      Weight:  75.479 kg (166 lb 6.4 oz)    SpO2: 99% 98% 99% 99%   Weight change:   Intake/Output Summary (Last 24 hours) at 07/01/11 1714 Last data filed at 07/01/11 1651  Gross per 24 hour  Intake    480 ml  Output   1100 ml  Net   -620 ml   Physical Exam: Lungs CTA without WRR Abd: S, NT, ND CV: RRR without MGR Ext: no c/c/e  Lab Results: Basic Metabolic Panel:  Lab 07/01/11 1610 06/30/11 1348  NA 140 141  K 3.6 3.5  CL 103 98  CO2 27 28  GLUCOSE 87 131*  BUN 14 15  CREATININE 1.00 1.00  CALCIUM 9.5 10.3  MG 2.6* --  PHOS -- --   Liver Function Tests:  Lab 07/01/11 0553 06/30/11 1348  AST 23 32  ALT 17 21  ALKPHOS 152* 170*  BILITOT 0.3 0.3  PROT 8.1 9.4*  ALBUMIN 3.9 4.3    Lab 06/30/11 1348  LIPASE 35  AMYLASE --   No results found for this basename: AMMONIA:2 in the last 168 hours CBC:  Lab 07/01/11 0553 06/30/11 1348  WBC 8.7 12.9*  NEUTROABS -- 10.6*  HGB 12.9 13.9  HCT 39.7 42.2  MCV 84.8 84.9  PLT 319 313   Cardiac Enzymes:  Lab 06/30/11 1348  CKTOTAL --  CKMB --  CKMBINDEX --  TROPONINI <0.30   BNP: No results found for this basename: PROBNP:3 in the last 168 hours D-Dimer: No results found for this basename: DDIMER:2 in the last 168 hours CBG: No results found for this basename: GLUCAP:6 in the last 168 hours Hemoglobin A1C: No results found for this basename: HGBA1C in the last 168 hours Fasting Lipid Panel: No results found for this basename: CHOL,HDL,LDLCALC,TRIG,CHOLHDL,LDLDIRECT in the last 960 hours Thyroid Function Tests: No results found for  this basename: TSH,T4TOTAL,FREET4,T3FREE,THYROIDAB in the last 168 hours Coagulation: No results found for this basename: LABPROT:4,INR:4 in the last 168 hours Anemia Panel: No results found for this basename: VITAMINB12,FOLATE,FERRITIN,TIBC,IRON,RETICCTPCT in the last 168 hours Urine Drug Screen: Drugs of Abuse     Component Value Date/Time   LABOPIA NONE DETECTED 07/01/2011 0305   COCAINSCRNUR NONE DETECTED 07/01/2011 0305   LABBENZ NONE DETECTED 07/01/2011 0305   AMPHETMU NONE DETECTED 07/01/2011 0305   THCU NONE DETECTED 07/01/2011 0305   LABBARB NONE DETECTED 07/01/2011 0305    Alcohol Level: No results found for this basename: ETH:2 in the last 168 hours Urinalysis:  Lab 06/30/11 1511  COLORURINE YELLOW  LABSPEC 1.025  PHURINE 6.0  GLUCOSEU NEGATIVE  HGBUR SMALL*  BILIRUBINUR NEGATIVE  KETONESUR NEGATIVE  PROTEINUR 100*  UROBILINOGEN 0.2  NITRITE NEGATIVE  LEUKOCYTESUR NEGATIVE   Micro Results: No results found for this or any previous visit (from the past 240 hour(s)). Studies/Results: Dg Chest 2 View  06/30/2011  *RADIOLOGY REPORT*  Clinical Data: Weakness.  Smoker.  CHEST - 2 VIEW  Comparison: Plain films of the chest 04/23/2008.  Findings: The lungs are clear.  Heart size  is upper normal.  No pneumothorax or pleural effusion.  No focal bony abnormality. Spinal stimulator device noted.  IMPRESSION: No acute disease.  Original Report Authenticated By: Bernadene Bell. Maricela Curet, M.D.   Ct Abdomen Pelvis W Contrast  06/30/2011  *RADIOLOGY REPORT*  Clinical Data: Nausea, vomiting, fatigue, decreased appetite and weakness, history of renal carcinoma  CT ABDOMEN AND PELVIS WITH CONTRAST  Technique:  Multidetector CT imaging of the abdomen and pelvis was performed following the standard protocol during bolus administration of intravenous contrast.  Contrast: OMNIPAQUE IOHEXOL 300 MG/ML  SOLN  Comparison: CT abdomen pelvis of 08/20/2004.  Findings: The lung bases are clear.  The liver  enhances with no focal abnormality and no ductal dilatation is seen.  There is a large gallstone within the gallbladder measuring 3.5 cm in maximum diameter containing some gas centrally.  The gallbladder wall is slightly prominent and ultrasound may be helpful to assess for developing acute cholecystitis.  No ductal dilatation is seen.  The pancreas is normal in size and the pancreatic duct is not dilated. The adrenal glands and spleen are unremarkable.  The stomach is decompressed and cannot be evaluated.  The kidneys enhance with no calculus or hydronephrosis.  The previously noted low attenuation mass in the lower pole of the right kidney medially is relatively stable measuring 13 mm in maximum diameter. This low attenuation right renal lesion measures 24 HU on the portal venous phase, and 12 HU on the delayed images.  The abdominal aorta is normal in caliber with mild atheromatous change present.  Neurostimulator electrodes are noted in the lower thoracic spinal canal.  No adenopathy is seen.  The urinary bladder is not well distended.  The uterus has previously been resected.  No adnexal lesion is seen.  No fluid is noted within the pelvis.  The colon is largely decompressed and difficult to evaluate.  No definite mass is seen.  The appendix and terminal ileum are unremarkable. Lumbar spine fusion is noted at the L4-5 level.  IMPRESSION:  1.  3.5 cm gallstone within the gallbladder.  The gallbladder wall is slightly irregular.  Cannot exclude developing acute cholecystitis. 2.  No significant change in low attenuation lower pole right renal lesion when compared to the CT from 2006. 3.  The appendix and terminal ileum are unremarkable.  Original Report Authenticated By: Juline Patch, M.D.   Scheduled Meds:   . amLODipine  10 mg Oral Daily  . chlorproMAZINE  25 mg Oral QHS  . enoxaparin  40 mg Subcutaneous Q24H  . haloperidol lactate  5 mg Intravenous Once  . lisinopril  10 mg Oral Daily  .  promethazine  12.5 mg Intravenous Once  . sodium chloride      . DISCONTD: sodium chloride   Intravenous STAT  . DISCONTD: pantoprazole (PROTONIX) IV  40 mg Intravenous QHS   Continuous Infusions:   . DISCONTD: sodium chloride 500 mL (06/30/11 1349)  . DISCONTD: 0.9 % NaCl with KCl 20 mEq / Richards 150 mL/hr at 07/01/11 0802   PRN Meds:.ALPRAZolam, bisacodyl, HYDROmorphone, ondansetron, ondansetron (ZOFRAN) IV, promethazine, sodium phosphate, DISCONTD: ALPRAZolam, DISCONTD: LORazepam, DISCONTD: ondansetron, DISCONTD: ondansetron (ZOFRAN) IV Assessment/Plan: Principal Problem:  *Nausea & vomiting Active Problems:  Hypertension  Lactic acid increased  Diastolic heart failure  Cholelithiasis  Depression  Edema  OSA on CPAP  Insomnia  Check RUQ [place] to r/o acute cholelithiasis. Resume CPAP. Resume antihypertensives.   LOS: 1 day   Rebecca Richards 07/01/2011, 5:14 PM

## 2011-07-02 ENCOUNTER — Inpatient Hospital Stay (HOSPITAL_COMMUNITY): Payer: Medicare Other

## 2011-07-02 ENCOUNTER — Other Ambulatory Visit (HOSPITAL_COMMUNITY): Payer: Medicare Other

## 2011-07-02 LAB — URINE CULTURE
Colony Count: 6000
Culture  Setup Time: 201304060340

## 2011-07-02 MED ORDER — DIPHENHYDRAMINE HCL 25 MG PO TABS: 50.0000 mg | ORAL_TABLET | Freq: Every day | ORAL | Status: DC

## 2011-07-02 MED ORDER — ALPRAZOLAM 1 MG PO TABS: 1.0000 mg | ORAL_TABLET | Freq: Three times a day (TID) | ORAL | Status: DC | PRN

## 2011-07-02 MED ORDER — MELATONIN 10 MG PO TABS: 10.0000 mg | ORAL_TABLET | Freq: Every day | ORAL | Status: DC

## 2011-07-02 NOTE — Discharge Summary (Signed)
Physician Discharge Summary  Patient ID: Rebecca Richards MRN: [id number] DOB/AGE: [id number] 64 y.o.  Admit date: 06/30/2011 Discharge date: 07/02/2011  Discharge Diagnoses:  Principal Problem:  *Nausea & vomiting Active Problems:  Hypertension  Lactic acid increased  Diastolic heart failure  Cholelithiasis  Depression  Edema  OSA on CPAP  Insomnia   Medication List  As of 07/02/2011 12:01 PM   STOP taking these medications         CO Q 10 PO      furosemide 20 MG tablet      methylphenidate 10 MG tablet      VITAMIN B COMPLEX PO         TAKE these medications         ALPRAZolam 1 MG tablet   Commonly known as: XANAX   Take 1 mg by mouth 3 (three) times daily as needed. anxiety      ALPRAZolam 1 MG tablet   Commonly known as: XANAX   Take 1 tablet (1 mg total) by mouth 3 (three) times daily as needed for anxiety or sleep.      amLODipine 10 MG tablet   Commonly known as: NORVASC   Take 10 mg by mouth daily.      buprenorphine-naloxone 2-0.5 MG Subl   Commonly known as: SUBOXONE   Place 1 tablet under the tongue 3 (three) times daily.      buPROPion 150 MG 12 hr tablet   Commonly known as: WELLBUTRIN SR   Take 150 mg by mouth 2 (two) times daily.      chlorproMAZINE 25 MG tablet   Commonly known as: THORAZINE   Take 25 mg by mouth at bedtime.      diphenhydrAMINE 25 MG tablet   Commonly known as: BENADRYL   Take 2 tablets (50 mg total) by mouth at bedtime.      lisinopril 10 MG tablet   Commonly known as: PRINIVIL,ZESTRIL   Take 10 mg by mouth daily.      Melatonin 10 MG Tabs   Take 10 mg by mouth at bedtime.      promethazine 25 MG tablet   Commonly known as: PHENERGAN   Take 0.5 tablets (12.5 mg total) by mouth every 6 (six) hours as needed for nausea.            Discharge Orders    Future Appointments: Provider: Department: Dept Phone: Center:   07/10/2011 12:00 PM [facility]  None     Future Orders Please  Complete By Expires   Diet - low sodium heart healthy      Activity as tolerated - No restrictions      Discharge instructions      Comments:   Low fat diet   Activity as tolerated - No restrictions         Follow-up Information    Follow up with Marlane Hatcher, MD. (As needed if nause continues)    Contact information:   [address] [place] [address] [phone]          Disposition: 01-Home or Self Care  Discharged Condition: stable  Consults:  none  Labs:   Results for orders placed during the hospital encounter of 06/30/11 (from the past 48 hour(s))  CBC     Status: Abnormal   Collection Time   06/30/11  1:48 PM      Component Value Range Comment   WBC 12.9 (*) 4.0 -  10.5 (K/uL)    RBC 4.97  3.87 - 5.11 (MIL/uL)    Hemoglobin 13.9  12.0 - 15.0 (g/dL)    HCT 95.2  84.1 - 32.4 (%)    MCV 84.9  78.0 - 100.0 (fL)    MCH 28.0  26.0 - 34.0 (pg)    MCHC 32.9  30.0 - 36.0 (g/dL)    RDW 40.1  02.7 - 25.3 (%)    Platelets 313  150 - 400 (K/uL)   DIFFERENTIAL     Status: Abnormal   Collection Time   06/30/11  1:48 PM      Component Value Range Comment   Neutrophils Relative 82 (*) 43 - 77 (%)    Neutro Abs 10.6 (*) 1.7 - 7.7 (K/uL)    Lymphocytes Relative 11 (*) 12 - 46 (%)    Lymphs Abs 1.4  0.7 - 4.0 (K/uL)    Monocytes Relative 6  3 - 12 (%)    Monocytes Absolute 0.8  0.1 - 1.0 (K/uL)    Eosinophils Relative 1  0 - 5 (%)    Eosinophils Absolute 0.1  0.0 - 0.7 (K/uL)    Basophils Relative 0  0 - 1 (%)    Basophils Absolute 0.1  0.0 - 0.1 (K/uL)   COMPREHENSIVE METABOLIC PANEL     Status: Abnormal   Collection Time   06/30/11  1:48 PM      Component Value Range Comment   Sodium 141  135 - 145 (mEq/L)    Potassium 3.5  3.5 - 5.1 (mEq/L)    Chloride 98  96 - 112 (mEq/L)    CO2 28  19 - 32 (mEq/L)    Glucose, Bld 131 (*) 70 - 99 (mg/dL)    BUN 15  6 - 23 (mg/dL)    Creatinine, Ser 6.64  0.50 - 1.10 (mg/dL)    Calcium 40.3  8.4 - 10.5  (mg/dL)    Total Protein 9.4 (*) 6.0 - 8.3 (g/dL)    Albumin 4.3  3.5 - 5.2 (g/dL)    AST 32  0 - 37 (U/L)    ALT 21  0 - 35 (U/L)    Alkaline Phosphatase 170 (*) 39 - 117 (U/L)    Total Bilirubin 0.3  0.3 - 1.2 (mg/dL)    GFR calc non Af Amer 59 (*) >90 (mL/min)    GFR calc Af Amer 68 (*) >90 (mL/min)   LIPASE, BLOOD     Status: Normal   Collection Time   06/30/11  1:48 PM      Component Value Range Comment   Lipase 35  11 - 59 (U/L)   TROPONIN I     Status: Normal   Collection Time   06/30/11  1:48 PM      Component Value Range Comment   Troponin I <0.30  <0.30 (ng/mL)   PROCALCITONIN     Status: Normal   Collection Time   06/30/11  2:03 PM      Component Value Range Comment   Procalcitonin <0.10     LACTIC ACID, PLASMA     Status: Abnormal   Collection Time   06/30/11  2:05 PM      Component Value Range Comment   Lactic Acid, Venous 3.7 (*) 0.5 - 2.2 (mmol/L)   URINALYSIS, ROUTINE W REFLEX MICROSCOPIC     Status: Abnormal   Collection Time   06/30/11  3:11 PM      Component Value Range Comment  Color, Urine YELLOW  YELLOW     APPearance CLEAR  CLEAR     Specific Gravity, Urine 1.025  1.005 - 1.030     pH 6.0  5.0 - 8.0     Glucose, UA NEGATIVE  NEGATIVE (mg/dL)    Hgb urine dipstick SMALL (*) NEGATIVE     Bilirubin Urine NEGATIVE  NEGATIVE     Ketones, ur NEGATIVE  NEGATIVE (mg/dL)    Protein, ur 454 (*) NEGATIVE (mg/dL)    Urobilinogen, UA 0.2  0.0 - 1.0 (mg/dL)    Nitrite NEGATIVE  NEGATIVE     Leukocytes, UA NEGATIVE  NEGATIVE    URINE CULTURE     Status: Normal   Collection Time   06/30/11  3:11 PM      Component Value Range Comment   Specimen Description URINE, CLEAN CATCH      Special Requests NONE      Culture  Setup Time [id number]      Colony Count 6,000 COLONIES/ML      Culture INSIGNIFICANT GROWTH      Report Status 07/02/2011 FINAL     URINE MICROSCOPIC-ADD ON     Status: Abnormal   Collection Time   06/30/11  3:11 PM      Component Value Range Comment    Squamous Epithelial / LPF RARE  RARE     WBC, UA 0-2  <3 (WBC/hpf)    RBC / HPF 0-2  <3 (RBC/hpf)    Bacteria, UA FEW (*) RARE     Casts HYALINE CASTS (*) NEGATIVE     Urine-Other MUCOUS PRESENT     URINE RAPID DRUG SCREEN (HOSP PERFORMED)     Status: Normal   Collection Time   07/01/11  3:05 AM      Component Value Range Comment   Opiates NONE DETECTED  NONE DETECTED     Cocaine NONE DETECTED  NONE DETECTED     Benzodiazepines NONE DETECTED  NONE DETECTED     Amphetamines NONE DETECTED  NONE DETECTED     Tetrahydrocannabinol NONE DETECTED  NONE DETECTED     Barbiturates NONE DETECTED  NONE DETECTED    CBC     Status: Normal   Collection Time   07/01/11  5:53 AM      Component Value Range Comment   WBC 8.7  4.0 - 10.5 (K/uL)    RBC 4.68  3.87 - 5.11 (MIL/uL)    Hemoglobin 12.9  12.0 - 15.0 (g/dL)    HCT 56.2  13.0 - 86.5 (%)    MCV 84.8  78.0 - 100.0 (fL)    MCH 27.6  26.0 - 34.0 (pg)    MCHC 32.5  30.0 - 36.0 (g/dL)    RDW 78.4  69.6 - 29.5 (%)    Platelets 319  150 - 400 (K/uL)   MAGNESIUM     Status: Abnormal   Collection Time   07/01/11  5:53 AM      Component Value Range Comment   Magnesium 2.6 (*) 1.5 - 2.5 (mg/dL)   COMPREHENSIVE METABOLIC PANEL     Status: Abnormal   Collection Time   07/01/11  5:53 AM      Component Value Range Comment   Sodium 140  135 - 145 (mEq/L)    Potassium 3.6  3.5 - 5.1 (mEq/L)    Chloride 103  96 - 112 (mEq/L)    CO2 27  19 - 32 (mEq/L)    Glucose, Bld 87  70 - 99 (mg/dL)    BUN 14  6 - 23 (mg/dL)    Creatinine, Ser 1.30  0.50 - 1.10 (mg/dL)    Calcium 9.5  8.4 - 10.5 (mg/dL)    Total Protein 8.1  6.0 - 8.3 (g/dL)    Albumin 3.9  3.5 - 5.2 (g/dL)    AST 23  0 - 37 (U/L)    ALT 17  0 - 35 (U/L)    Alkaline Phosphatase 152 (*) 39 - 117 (U/L)    Total Bilirubin 0.3  0.3 - 1.2 (mg/dL)    GFR calc non Af Amer 59 (*) >90 (mL/min)    GFR calc Af Amer 68 (*) >90 (mL/min)   TSH     Status: Normal   Collection Time   07/01/11  5:53 AM       Component Value Range Comment   TSH 1.202  0.350 - 4.500 (uIU/mL)     Diagnostics:  Dg Chest 2 View  06/30/2011  *RADIOLOGY REPORT*  Clinical Data: Weakness.  Smoker.  CHEST - 2 VIEW  Comparison: Plain films of the chest 04/23/2008.  Findings: The lungs are clear.  Heart size is upper normal.  No pneumothorax or pleural effusion.  No focal bony abnormality. Spinal stimulator device noted.  IMPRESSION: No acute disease.  Original Report Authenticated By: Bernadene Bell. Maricela Curet, M.D.   Ct Abdomen Pelvis W Contrast  06/30/2011  *RADIOLOGY REPORT*  Clinical Data: Nausea, vomiting, fatigue, decreased appetite and weakness, history of renal carcinoma  CT ABDOMEN AND PELVIS WITH CONTRAST  Technique:  Multidetector CT imaging of the abdomen and pelvis was performed following the standard protocol during bolus administration of intravenous contrast.  Contrast: OMNIPAQUE IOHEXOL 300 MG/ML  SOLN  Comparison: CT abdomen pelvis of 08/20/2004.  Findings: The lung bases are clear.  The liver enhances with no focal abnormality and no ductal dilatation is seen.  There is a large gallstone within the gallbladder measuring 3.5 cm in maximum diameter containing some gas centrally.  The gallbladder wall is slightly prominent and ultrasound may be helpful to assess for developing acute cholecystitis.  No ductal dilatation is seen.  The pancreas is normal in size and the pancreatic duct is not dilated. The adrenal glands and spleen are unremarkable.  The stomach is decompressed and cannot be evaluated.  The kidneys enhance with no calculus or hydronephrosis.  The previously noted low attenuation mass in the lower pole of the right kidney medially is relatively stable measuring 13 mm in maximum diameter. This low attenuation right renal lesion measures 24 HU on the portal venous phase, and 12 HU on the delayed images.  The abdominal aorta is normal in caliber with mild atheromatous change present.  Neurostimulator electrodes are  noted in the lower thoracic spinal canal.  No adenopathy is seen.  The urinary bladder is not well distended.  The uterus has previously been resected.  No adnexal lesion is seen.  No fluid is noted within the pelvis.  The colon is largely decompressed and difficult to evaluate.  No definite mass is seen.  The appendix and terminal ileum are unremarkable. Lumbar spine fusion is noted at the L4-5 level.  IMPRESSION:  1.  3.5 cm gallstone within the gallbladder.  The gallbladder wall is slightly irregular.  Cannot exclude developing acute cholecystitis. 2.  No significant change in low attenuation lower pole right renal lesion when compared to the CT from 2006. 3.  The appendix and terminal ileum are unremarkable.  Original Report Authenticated By: Juline Patch, M.D.   EKG: NSR  Full Code   Hospital Course:  See H&P for complete admission details. The patient is a 66 year old black female who presented with 2 complaints. Intractable vomiting. Severe insomnia. She felt that her symptoms were related to medication changes. She was started on Ritalin a few weeks ago and has had symptoms since then. She received several doses of Zofran in the emergency room and continued to have nausea and vomiting so was admitted to the hospitalist service. CT of the abdomen pelvis showed 3.5 cm gallstone. Slightly irregular gallbladder wall. Patient had no abdominal pain or tenderness. Her nausea vomiting resolved. She has had no fevers. Her Ritalin was stopped. She would like to go home. She will followup with her psychiatrist and or sleep specialist regarding her medication changes in her severe insomnia. She has a history of sleep apnea. Should she continue to have problems with nausea and vomiting, she may follow up with general surgery to consider elective cholecystectomy. She has no clinical evidence of acute cholecystitis at this time but may be suffering from biliary colic from the cholelithiasis. Total time on the day  of discharge greater than 30 minutes.  Discharge Exam:  Blood pressure 154/91, pulse 98, temperature 97.8 F (36.6 C), temperature source Oral, resp. rate 18, height 5\' 1"  (1.549 m), weight 75.479 kg (166 lb 6.4 oz), SpO2 99.00%.  Unchanged from 07/01/11  Signed: Crista Curb L 07/02/2011, 12:01 PM

## 2011-07-02 NOTE — Progress Notes (Signed)
Patient given discharge instructions with no questions

## 2011-07-06 ENCOUNTER — Encounter (HOSPITAL_COMMUNITY): Payer: Self-pay | Admitting: Pharmacy Technician

## 2011-07-10 ENCOUNTER — Other Ambulatory Visit: Payer: Self-pay | Admitting: Physician Assistant

## 2011-07-10 ENCOUNTER — Inpatient Hospital Stay (HOSPITAL_COMMUNITY): Admission: RE | Admit: 2011-07-10 | Discharge: 2011-07-10 | Payer: Medicare Other | Source: Ambulatory Visit

## 2011-07-10 NOTE — Progress Notes (Signed)
Pt has not arrived for PAT appointment. She is not at home number and does not answer mobile number. Left message on mobile number requesting that she call to reschedule PAT.

## 2011-07-10 NOTE — Pre-Procedure Instructions (Signed)
20 Rebecca Richards  07/10/2011   Your procedure is scheduled on:  Friday April 26  Report to [facility] at 5:30 AM.  Call this number if you have problems the morning of surgery: [phone]   Remember:   Do not eat food:After Midnight.  May have clear liquids: up to 4 Hours before arrival.  Clear liquids include soda, tea, black coffee, apple or grape juice, broth.  Take these medicines the morning of surgery with A SIP OF WATER: Xanax if needed, Amlodipine, Wellbutrin, Phenergan if needed    Do not wear jewelry, make-up or nail polish.  Do not wear lotions, powders, or perfumes. You may wear deodorant.  Do not shave 48 hours prior to surgery.  Do not bring valuables to the hospital.  Contacts, dentures or bridgework may not be worn into surgery.  Leave suitcase in the car. After surgery it may be brought to your room.  For patients admitted to the hospital, checkout time is 11:00 AM the day of discharge.   Patients discharged the day of surgery will not be allowed to drive home.  Name and phone number of your driver: NA  Special Instructions: Incentive Spirometry - Practice and bring it with you on the day of surgery. and CHG Shower Use Special Wash: 1/2 bottle night before surgery and 1/2 bottle morning of surgery.   Please read over the following fact sheets that you were given: Pain Booklet, Coughing and Deep Breathing, Blood Transfusion Information, Total Joint Packet and Surgical Site Infection Prevention

## 2011-07-18 ENCOUNTER — Inpatient Hospital Stay (HOSPITAL_COMMUNITY): Admission: RE | Admit: 2011-07-18 | Payer: Medicare Other | Source: Ambulatory Visit

## 2011-07-19 ENCOUNTER — Encounter (HOSPITAL_COMMUNITY): Payer: Self-pay

## 2011-07-19 ENCOUNTER — Encounter (HOSPITAL_COMMUNITY)
Admission: RE | Admit: 2011-07-19 | Discharge: 2011-07-19 | Disposition: A | Discharge status: Home / Self Care | Payer: Medicare Other | Source: Ambulatory Visit | Attending: Orthopedic Surgery | Admitting: Orthopedic Surgery

## 2011-07-19 HISTORY — DX: Inflammatory liver disease, unspecified: K75.9

## 2011-07-19 HISTORY — DX: Lichen planus, unspecified: L43.9

## 2011-07-19 HISTORY — DX: Nasal congestion: R09.81

## 2011-07-19 HISTORY — DX: Unspecified osteoarthritis, unspecified site: M19.90

## 2011-07-19 HISTORY — DX: Anxiety disorder, unspecified: F41.9

## 2011-07-19 LAB — DIFFERENTIAL
Basophils Relative: 1 % (ref 0–1)
Eosinophils Absolute: 0.5 10*3/uL (ref 0.0–0.7)
Lymphs Abs: 1.5 10*3/uL (ref 0.7–4.0)
Neutrophils Relative %: 59 % (ref 43–77)

## 2011-07-19 LAB — PROTIME-INR
INR: 0.92 (ref 0.00–1.49)
Prothrombin Time: 12.6 seconds (ref 11.6–15.2)

## 2011-07-19 LAB — COMPREHENSIVE METABOLIC PANEL
Alkaline Phosphatase: 165 U/L — ABNORMAL HIGH (ref 39–117)
BUN: 7 mg/dL (ref 6–23)
Creatinine, Ser: 1.01 mg/dL (ref 0.50–1.10)
GFR calc Af Amer: 67 mL/min — ABNORMAL LOW (ref >90)
Glucose, Bld: 206 mg/dL — ABNORMAL HIGH (ref 70–99)
Potassium: 4.1 mEq/L (ref 3.5–5.1)
Total Bilirubin: 0.1 mg/dL — ABNORMAL LOW (ref 0.3–1.2)
Total Protein: 7.8 g/dL (ref 6.0–8.3)

## 2011-07-19 LAB — URINALYSIS, ROUTINE W REFLEX MICROSCOPIC
Ketones, ur: 15 mg/dL — AB
Nitrite: NEGATIVE
Protein, ur: 30 mg/dL — AB
Urobilinogen, UA: 0.2 mg/dL (ref 0.0–1.0)

## 2011-07-19 LAB — CBC
HCT: 38.7 % (ref 36.0–46.0)
Hemoglobin: 12.7 g/dL (ref 12.0–15.0)
MCHC: 32.8 g/dL (ref 30.0–36.0)
MCV: 85.8 fL (ref 78.0–100.0)

## 2011-07-19 LAB — URINE MICROSCOPIC-ADD ON

## 2011-07-19 LAB — SURGICAL PCR SCREEN: MRSA, PCR: NEGATIVE

## 2011-07-19 LAB — TYPE AND SCREEN
ABO/RH(D): A POS
Antibody Screen: NEGATIVE

## 2011-07-19 LAB — APTT: aPTT: 32 seconds (ref 24–37)

## 2011-07-20 LAB — URINE CULTURE

## 2011-07-20 MED ORDER — CHLORHEXIDINE GLUCONATE 4 % EX LIQD: 60.0000 mL | Freq: Once | CUTANEOUS | Status: DC

## 2011-07-20 MED ORDER — SODIUM CHLORIDE 0.9 % IV SOLN: INTRAVENOUS | Status: DC

## 2011-07-20 MED ORDER — CEFAZOLIN SODIUM 1-5 GM-% IV SOLN
1.0000 g | INTRAVENOUS | Status: AC
  Administered 2011-07-21: 1 g via INTRAVENOUS
  Filled 2011-07-20: qty 50

## 2011-07-20 NOTE — H&P (Signed)
NAME: Rebecca Richards MRN: #[id number] DATE: July 14, 2011 DOB: 1945/08/01  COMPLAINT:     Follow up left knee osteoarthritis.  HPI:     The patient is a 66 year old female with known end-stage osteoarthritis of the left knee, also with hypertension, dyslipidemia, questionable hepatitis history, questionable kidney cancer with renal disorder, sleep apnea, depression, chronic back pain with an implantable spinal stimulator, history of benign brain tumor, and tobacco abuse.  The patient is also in chronic pain management, currently taking Suboxone.  She has failed conservative treatment.  She is not a good candidate for oral NSAIDs.  She states she has received multiple injections in the knee from her primary care physician's office and Dr. Sanjuan Dame office.  She has previous x-rays from our office done on 06/02/11 documenting bone-on-bone arthritis of the left knee.  It is significantly affecting her quality of life and activities of daily living.  She has the most pain with weightbearing.  It will occasionally awaken her from sleep.  She is here today for further discussion regarding total knee replacement.  CURRENT MEDICATIONS:     ? furosemide.  Also includes lisinopril, methylphenidate, Suboxone, chlorpromazine, and potassium.  She sleeps with a CPAP at a setting of 14.0 at night.  ALLERGIES:     NKDA.  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  PAST SURGICAL HISTORY:     Knee surgery in 1981 and 1982, hysterectomy in 1982, and multiple back surgeries in 2002, 2009, and 2011.  HOSPITALIZATIONS:  Sleep apnea in 2005.  ROS:    Ten point systemic review is obtained and is positive for blurred vision, partial dentures, high blood pressure, constipation, hepatitis, nosebleeds, kidney cancer, skin rashes,  ankle swelling, kidney problems, night urinating, and sleep apnea. Please refer to patient information form for details.  FAMILY HISTORY:    Positive for hypertension in mother, brother, grandparents, and child; diabetes in mother, brother, and grandparents; stroke in grandparents; cancer in mother; and kidney disease in brother.  SOCIAL HISTORY:    Patient smokes 10-15 cigarettes daily.  She is not quite sure how long she has been doing this, but knows it has been for several years.  She states she does not drink alcohol.  She does have a history of drug and alcohol problems.  She lives with her son in a one-story residence.  She states she is retired.  EXAM:     The patient is seated in the examination room in no acute distress.  She is alert, oriented, and appears appropriate age.  Height 5 feet, 1 inch.  Weight 172 pounds.  BMI calculated at 32.5.  Temperature 97.6 degrees.  Pulse 106.  Respirations 20.   Skin:   Somewhat diffuse rash on the bilateral lower extremities mainly around the ankles which she describes as lichen planus.  There is no surrounding erythema or drainage.  The rash is not pruritic. HEENT:   PERRLA.  She states she did have some partial dentures, but is not wearing them today.  Neck is supple with good range of motion.   Chest/Lungs:   Normal breath sounds; clear to auscultation bilaterally.   Heart:   Regular rate and rhythm.  She does have a systolic murmur.   Abdomen:   Soft, non tender.  Active bowel sounds in all four quadrants. Rectal/Breast:  Not indicated for surgery. Neuro:   Cranial nerves grossly intact. Musculoskeletal:    Examination of the left lower extremity shows he is neurovascularly intact.  She has tenderness to palpation over the medial and lateral joint lines of the knee as well as patellofemoral crepitus.  She has limited range of motion.  She ambulates with an antalgic gait favoring the left leg.  X-RAYS:     No images were obtained today.  X-rays  taken of the left knee on 06/02/11 show severe bone-on-bone arthritis with medial translation of the femur on the tibia.  IMPRESSION:      1. Left knee osteoarthritis, end stage. 2. Hypertension. 3. Dyslipidemia. 4. Sleep apnea. 5. Chronic low back pain with implantable spinal stimulator. 6. History of drug and alcohol abuse, currently in chronic pain management with Suboxone.  RECOMMENDATIONS:     The risks and benefits of left total knee arthroplasty were discussed again today and the patient wishes to proceed with the surgery.  This is currently scheduled for 07/21/11.  She has obtained medical clearance from her primary care physician, Dr. Sherryll Burger; cardiac clearance from Dr. Myrtis Ser at [facility] cardiology (echocardiogram performed on 05/10/11); and her pain management doctor, Dr. Carroll Sage at [facility] who has instructed [place] to stop Suboxone on the day of surgery and okay for narcotic pain medications as needed, recommending possibly Dilaudid.  We have contacted [employer] who gave her the spinal stimulator.  They are sending a rep the morning of the scheduled surgery to turn off the stimulator.  We will anticipate skilled nursing facility for a short time for recovery following the surgery.  She will need CPAP at pressure of 14.0 at night.  We will need to follow up on preoperative labs regarding kidney function and anticoagulation therapy following surgery.  If she has any further questions or concerns prior to the day of surgery she may contact the office.   Josh Manessa Buley P.A.-C/[id number]  Auto-Authenticated by Estanislado Spire P.A.-C

## 2011-07-21 ENCOUNTER — Encounter (HOSPITAL_COMMUNITY): Payer: Self-pay | Admitting: Anesthesiology

## 2011-07-21 ENCOUNTER — Ambulatory Visit (HOSPITAL_COMMUNITY): Payer: Medicare Other | Admitting: Anesthesiology

## 2011-07-21 ENCOUNTER — Inpatient Hospital Stay (HOSPITAL_COMMUNITY)
Admission: RE | Admit: 2011-07-21 | Discharge: 2011-07-25 | DRG: 470 | Disposition: A | Discharge status: Skilled Nursing Facility | Payer: Medicare Other | Source: Ambulatory Visit | Attending: Orthopedic Surgery | Admitting: Orthopedic Surgery

## 2011-07-21 ENCOUNTER — Encounter (HOSPITAL_COMMUNITY): Payer: Self-pay | Admitting: *Deleted

## 2011-07-21 ENCOUNTER — Encounter (HOSPITAL_COMMUNITY)
Admission: RE | Disposition: A | Discharge status: Skilled Nursing Facility | Payer: Self-pay | Source: Ambulatory Visit | Attending: Orthopedic Surgery

## 2011-07-21 ENCOUNTER — Ambulatory Visit (HOSPITAL_COMMUNITY): Payer: Medicare Other

## 2011-07-21 DIAGNOSIS — B1920 Unspecified viral hepatitis C without hepatic coma: Secondary | ICD-10-CM | POA: Diagnosis present

## 2011-07-21 DIAGNOSIS — F341 Dysthymic disorder: Secondary | ICD-10-CM | POA: Diagnosis present

## 2011-07-21 DIAGNOSIS — Z01812 Encounter for preprocedural laboratory examination: Secondary | ICD-10-CM

## 2011-07-21 DIAGNOSIS — M549 Dorsalgia, unspecified: Secondary | ICD-10-CM | POA: Diagnosis present

## 2011-07-21 DIAGNOSIS — Z79899 Other long term (current) drug therapy: Secondary | ICD-10-CM

## 2011-07-21 DIAGNOSIS — Z8249 Family history of ischemic heart disease and other diseases of the circulatory system: Secondary | ICD-10-CM

## 2011-07-21 DIAGNOSIS — M1710 Unilateral primary osteoarthritis, unspecified knee: Principal | ICD-10-CM | POA: Diagnosis present

## 2011-07-21 DIAGNOSIS — I10 Essential (primary) hypertension: Secondary | ICD-10-CM | POA: Diagnosis present

## 2011-07-21 DIAGNOSIS — E785 Hyperlipidemia, unspecified: Secondary | ICD-10-CM | POA: Diagnosis present

## 2011-07-21 DIAGNOSIS — F17200 Nicotine dependence, unspecified, uncomplicated: Secondary | ICD-10-CM | POA: Diagnosis present

## 2011-07-21 DIAGNOSIS — G8929 Other chronic pain: Secondary | ICD-10-CM | POA: Diagnosis present

## 2011-07-21 DIAGNOSIS — Z833 Family history of diabetes mellitus: Secondary | ICD-10-CM

## 2011-07-21 DIAGNOSIS — G4730 Sleep apnea, unspecified: Secondary | ICD-10-CM | POA: Diagnosis present

## 2011-07-21 HISTORY — PX: TOTAL KNEE ARTHROPLASTY: SHX125

## 2011-07-21 SURGERY — ARTHROPLASTY, KNEE, TOTAL
Anesthesia: General | Site: Knee | Laterality: Left | Wound class: Clean

## 2011-07-21 MED ORDER — ALPRAZOLAM 0.5 MG PO TABS
1.0000 mg | ORAL_TABLET | Freq: Three times a day (TID) | ORAL | Status: DC | PRN
  Administered 2011-07-23 – 2011-07-25 (×4): 1 mg via ORAL
  Filled 2011-07-21: qty 1
  Filled 2011-07-21 (×2): qty 2
  Filled 2011-07-21: qty 1
  Filled 2011-07-21: qty 2

## 2011-07-21 MED ORDER — SODIUM CHLORIDE 0.9 % IJ SOLN: 9.0000 mL | INTRAMUSCULAR | Status: DC | PRN

## 2011-07-21 MED ORDER — DIPHENHYDRAMINE HCL 12.5 MG/5ML PO ELIX: 12.5000 mg | ORAL_SOLUTION | Freq: Four times a day (QID) | ORAL | Status: DC | PRN

## 2011-07-21 MED ORDER — LACTATED RINGERS IV SOLN
INTRAVENOUS | Status: DC | PRN
  Administered 2011-07-21 (×2): via INTRAVENOUS

## 2011-07-21 MED ORDER — LISINOPRIL 10 MG PO TABS
10.0000 mg | ORAL_TABLET | Freq: Every day | ORAL | Status: DC
  Administered 2011-07-21 – 2011-07-25 (×5): 10 mg via ORAL
  Filled 2011-07-21 (×5): qty 1

## 2011-07-21 MED ORDER — FLEET ENEMA 7-19 GM/118ML RE ENEM: 1.0000 | ENEMA | Freq: Once | RECTAL | Status: AC | PRN

## 2011-07-21 MED ORDER — ONDANSETRON HCL 4 MG/2ML IJ SOLN: 4.0000 mg | Freq: Once | INTRAMUSCULAR | Status: DC | PRN

## 2011-07-21 MED ORDER — SODIUM CHLORIDE 0.9 % IV SOLN
INTRAVENOUS | Status: DC
  Administered 2011-07-21 – 2011-07-22 (×4): via INTRAVENOUS

## 2011-07-21 MED ORDER — BUPROPION HCL ER (SR) 150 MG PO TB12
150.0000 mg | ORAL_TABLET | Freq: Two times a day (BID) | ORAL | Status: DC
  Administered 2011-07-21 – 2011-07-25 (×8): 150 mg via ORAL
  Filled 2011-07-21 (×9): qty 1

## 2011-07-21 MED ORDER — ACETAMINOPHEN 10 MG/ML IV SOLN
1000.0000 mg | Freq: Four times a day (QID) | INTRAVENOUS | Status: AC
  Administered 2011-07-21 – 2011-07-22 (×4): 1000 mg via INTRAVENOUS
  Filled 2011-07-21 (×4): qty 100

## 2011-07-21 MED ORDER — AMLODIPINE BESYLATE 10 MG PO TABS
10.0000 mg | ORAL_TABLET | Freq: Every day | ORAL | Status: DC
  Administered 2011-07-21 – 2011-07-25 (×5): 10 mg via ORAL
  Filled 2011-07-21 (×5): qty 1

## 2011-07-21 MED ORDER — SODIUM CHLORIDE 0.9 % IV SOLN
200.0000 ug | INTRAVENOUS | Status: DC | PRN
  Administered 2011-07-21: 39.5 ug via INTRAVENOUS

## 2011-07-21 MED ORDER — MENTHOL 3 MG MT LOZG: 1.0000 | LOZENGE | OROMUCOSAL | Status: DC | PRN

## 2011-07-21 MED ORDER — MUPIROCIN 2 % EX OINT
TOPICAL_OINTMENT | Freq: Once | CUTANEOUS | Status: DC
  Filled 2011-07-21: qty 22

## 2011-07-21 MED ORDER — ONDANSETRON HCL 4 MG/2ML IJ SOLN
INTRAMUSCULAR | Status: DC | PRN
  Administered 2011-07-21: 4 mg via INTRAVENOUS

## 2011-07-21 MED ORDER — FENTANYL CITRATE 0.05 MG/ML IJ SOLN
INTRAMUSCULAR | Status: DC | PRN
  Administered 2011-07-21: 25 ug via INTRAVENOUS
  Administered 2011-07-21: 50 ug via INTRAVENOUS
  Administered 2011-07-21: 25 ug via INTRAVENOUS
  Administered 2011-07-21: 150 ug via INTRAVENOUS
  Administered 2011-07-21: 50 ug via INTRAVENOUS

## 2011-07-21 MED ORDER — DEXTROSE 5 % IV SOLN
500.0000 mg | INTRAVENOUS | Status: AC
  Administered 2011-07-21: 500 mg via INTRAVENOUS
  Filled 2011-07-21: qty 5

## 2011-07-21 MED ORDER — GLYCOPYRROLATE 0.2 MG/ML IJ SOLN
INTRAMUSCULAR | Status: DC | PRN
  Administered 2011-07-21: .5 mg via INTRAVENOUS

## 2011-07-21 MED ORDER — NEOSTIGMINE METHYLSULFATE 1 MG/ML IJ SOLN
INTRAMUSCULAR | Status: DC | PRN
  Administered 2011-07-21: 4 mg via INTRAVENOUS

## 2011-07-21 MED ORDER — ACETAMINOPHEN 10 MG/ML IV SOLN
INTRAVENOUS | Status: AC
  Filled 2011-07-21: qty 100

## 2011-07-21 MED ORDER — DIPHENHYDRAMINE HCL 50 MG/ML IJ SOLN: 12.5000 mg | Freq: Four times a day (QID) | INTRAMUSCULAR | Status: DC | PRN

## 2011-07-21 MED ORDER — LABETALOL HCL 5 MG/ML IV SOLN
INTRAVENOUS | Status: DC | PRN
  Administered 2011-07-21 (×4): 5 mg via INTRAVENOUS

## 2011-07-21 MED ORDER — NALOXONE HCL 0.4 MG/ML IJ SOLN: 0.4000 mg | INTRAMUSCULAR | Status: DC | PRN

## 2011-07-21 MED ORDER — PHENOL 1.4 % MT LIQD: 1.0000 | OROMUCOSAL | Status: DC | PRN

## 2011-07-21 MED ORDER — HYDROMORPHONE 0.3 MG/ML IV SOLN
INTRAVENOUS | Status: DC
  Administered 2011-07-21: 0.3 mg via INTRAVENOUS
  Administered 2011-07-21: 11:00:00 via INTRAVENOUS
  Administered 2011-07-21: 0.6 mg via INTRAVENOUS
  Administered 2011-07-22: 05:00:00 via INTRAVENOUS
  Administered 2011-07-22: 22 mL via INTRAVENOUS
  Administered 2011-07-22: 14:00:00 via INTRAVENOUS
  Filled 2011-07-21 (×2): qty 25

## 2011-07-21 MED ORDER — SODIUM CHLORIDE 0.9 % IV SOLN: 0.4000 ug/kg/h | INTRAVENOUS | Status: DC

## 2011-07-21 MED ORDER — ENOXAPARIN SODIUM 30 MG/0.3ML ~~LOC~~ SOLN
30.0000 mg | Freq: Two times a day (BID) | SUBCUTANEOUS | Status: DC
  Administered 2011-07-21 – 2011-07-25 (×8): 30 mg via SUBCUTANEOUS
  Filled 2011-07-21 (×9): qty 0.3

## 2011-07-21 MED ORDER — ONDANSETRON HCL 4 MG/2ML IJ SOLN: 4.0000 mg | Freq: Four times a day (QID) | INTRAMUSCULAR | Status: DC | PRN

## 2011-07-21 MED ORDER — MIDAZOLAM HCL 5 MG/5ML IJ SOLN
INTRAMUSCULAR | Status: DC | PRN
  Administered 2011-07-21 (×2): 1 mg via INTRAVENOUS

## 2011-07-21 MED ORDER — ACETAMINOPHEN 10 MG/ML IV SOLN
INTRAVENOUS | Status: DC | PRN
  Administered 2011-07-21: 1000 mg via INTRAVENOUS

## 2011-07-21 MED ORDER — DOCUSATE SODIUM 100 MG PO CAPS
100.0000 mg | ORAL_CAPSULE | Freq: Two times a day (BID) | ORAL | Status: DC
  Administered 2011-07-21 – 2011-07-25 (×8): 100 mg via ORAL
  Filled 2011-07-21 (×10): qty 1

## 2011-07-21 MED ORDER — LIDOCAINE HCL (CARDIAC) 20 MG/ML IV SOLN
INTRAVENOUS | Status: DC | PRN
  Administered 2011-07-21: 40 mg via INTRAVENOUS

## 2011-07-21 MED ORDER — ROCURONIUM BROMIDE 100 MG/10ML IV SOLN
INTRAVENOUS | Status: DC | PRN
  Administered 2011-07-21: 40 mg via INTRAVENOUS

## 2011-07-21 MED ORDER — SALINE SPRAY 0.65 % NA SOLN: 1.0000 | NASAL | Status: DC | PRN

## 2011-07-21 MED ORDER — SODIUM CHLORIDE 0.9 % IR SOLN
Status: DC | PRN
  Administered 2011-07-21: 3000 mL

## 2011-07-21 MED ORDER — METHOCARBAMOL 500 MG PO TABS
500.0000 mg | ORAL_TABLET | Freq: Four times a day (QID) | ORAL | Status: DC | PRN
  Administered 2011-07-22 – 2011-07-25 (×8): 500 mg via ORAL
  Filled 2011-07-21 (×9): qty 1

## 2011-07-21 MED ORDER — MUPIROCIN 2 % EX OINT
TOPICAL_OINTMENT | CUTANEOUS | Status: AC
  Administered 2011-07-21: 1 via NASAL
  Filled 2011-07-21: qty 22

## 2011-07-21 MED ORDER — SODIUM CHLORIDE 0.9 % IV SOLN
0.4000 ug/kg/h | INTRAVENOUS | Status: DC
  Filled 2011-07-21: qty 4

## 2011-07-21 MED ORDER — HYDROMORPHONE HCL PF 1 MG/ML IJ SOLN: 0.2500 mg | INTRAMUSCULAR | Status: DC | PRN

## 2011-07-21 MED ORDER — SENNOSIDES-DOCUSATE SODIUM 8.6-50 MG PO TABS
1.0000 | ORAL_TABLET | Freq: Every evening | ORAL | Status: DC | PRN
  Administered 2011-07-23 – 2011-07-25 (×2): 1 via ORAL
  Filled 2011-07-21 (×2): qty 1

## 2011-07-21 MED ORDER — METHOCARBAMOL 100 MG/ML IJ SOLN
500.0000 mg | Freq: Four times a day (QID) | INTRAVENOUS | Status: DC | PRN
  Filled 2011-07-21 (×2): qty 5

## 2011-07-21 MED ORDER — METOCLOPRAMIDE HCL 10 MG PO TABS
5.0000 mg | ORAL_TABLET | Freq: Three times a day (TID) | ORAL | Status: DC | PRN
  Filled 2011-07-21: qty 1

## 2011-07-21 MED ORDER — PROPOFOL 10 MG/ML IV EMUL
INTRAVENOUS | Status: DC | PRN
  Administered 2011-07-21: 120 mg via INTRAVENOUS

## 2011-07-21 MED ORDER — BUPIVACAINE-EPINEPHRINE PF 0.5-1:200000 % IJ SOLN
INTRAMUSCULAR | Status: DC | PRN
  Administered 2011-07-21: 30 mL

## 2011-07-21 MED ORDER — CHLORPROMAZINE HCL 10 MG PO TABS
10.0000 mg | ORAL_TABLET | Freq: Every evening | ORAL | Status: DC | PRN
  Administered 2011-07-21 – 2011-07-24 (×5): 10 mg via ORAL
  Filled 2011-07-21 (×10): qty 1

## 2011-07-21 MED ORDER — METOCLOPRAMIDE HCL 5 MG/ML IJ SOLN: 5.0000 mg | Freq: Three times a day (TID) | INTRAMUSCULAR | Status: DC | PRN

## 2011-07-21 SURGICAL SUPPLY — 58 items
BANDAGE ESMARK 6X9 LF (GAUZE/BANDAGES/DRESSINGS) ×1 IMPLANT
BLADE SAGITTAL 25.0X1.19X90 (BLADE) ×2 IMPLANT
BLADE SAW SAG 90X13X1.27 (BLADE) ×2 IMPLANT
BNDG CMPR 9X6 STRL LF SNTH (GAUZE/BANDAGES/DRESSINGS) ×1
BNDG ESMARK 6X9 LF (GAUZE/BANDAGES/DRESSINGS) ×2
BOWL SMART MIX CTS (DISPOSABLE) ×2 IMPLANT
CEMENT HV SMART SET (Cement) ×4 IMPLANT
CLOTH BEACON ORANGE TIMEOUT ST (SAFETY) ×2 IMPLANT
COVER BACK TABLE 24X17X13 BIG (DRAPES) IMPLANT
COVER SURGICAL LIGHT HANDLE (MISCELLANEOUS) ×2 IMPLANT
CUFF TOURNIQUET SINGLE 34IN LL (TOURNIQUET CUFF) ×2 IMPLANT
CUFF TOURNIQUET SINGLE 44IN (TOURNIQUET CUFF) IMPLANT
DRAPE INCISE IOBAN 66X45 STRL (DRAPES) ×1 IMPLANT
DRAPE ORTHO SPLIT 77X108 STRL (DRAPES) ×4
DRAPE SURG ORHT 6 SPLT 77X108 (DRAPES) ×2 IMPLANT
DRAPE U-SHAPE 47X51 STRL (DRAPES) ×2 IMPLANT
DRSG ADAPTIC 3X8 NADH LF (GAUZE/BANDAGES/DRESSINGS) ×2 IMPLANT
DRSG PAD ABDOMINAL 8X10 ST (GAUZE/BANDAGES/DRESSINGS) ×2 IMPLANT
DURAPREP 26ML APPLICATOR (WOUND CARE) ×2 IMPLANT
ELECT REM PT RETURN 9FT ADLT (ELECTROSURGICAL) ×2
ELECTRODE REM PT RTRN 9FT ADLT (ELECTROSURGICAL) ×1 IMPLANT
EVACUATOR 1/8 PVC DRAIN (DRAIN) ×2 IMPLANT
FACESHIELD LNG OPTICON STERILE (SAFETY) ×2 IMPLANT
FLOSEAL 10ML (HEMOSTASIS) IMPLANT
GLOVE BIOGEL PI IND STRL 8 (GLOVE) ×2 IMPLANT
GLOVE BIOGEL PI INDICATOR 8 (GLOVE) ×2
GLOVE ORTHO TXT STRL SZ7.5 (GLOVE) ×6 IMPLANT
GLOVE SURG ORTHO 8.0 STRL STRW (GLOVE) ×6 IMPLANT
GOWN PREVENTION PLUS XLARGE (GOWN DISPOSABLE) ×2 IMPLANT
GOWN PREVENTION PLUS XXLARGE (GOWN DISPOSABLE) ×2 IMPLANT
GOWN STRL NON-REIN LRG LVL3 (GOWN DISPOSABLE) ×4 IMPLANT
HANDPIECE INTERPULSE COAX TIP (DISPOSABLE) ×2
HOOD PEEL AWAY FACE SHEILD DIS (HOOD) ×2 IMPLANT
IMMOBILIZER KNEE 22 UNIV (SOFTGOODS) ×2 IMPLANT
KIT BASIN OR (CUSTOM PROCEDURE TRAY) ×2 IMPLANT
KIT ROOM TURNOVER OR (KITS) ×2 IMPLANT
MANIFOLD NEPTUNE II (INSTRUMENTS) ×2 IMPLANT
NEEDLE 22X1 1/2 (OR ONLY) (NEEDLE) IMPLANT
NS IRRIG 1000ML POUR BTL (IV SOLUTION) ×2 IMPLANT
PACK TOTAL JOINT (CUSTOM PROCEDURE TRAY) ×2 IMPLANT
PAD ARMBOARD 7.5X6 YLW CONV (MISCELLANEOUS) ×4 IMPLANT
PAD CAST 4YDX4 CTTN HI CHSV (CAST SUPPLIES) ×1 IMPLANT
PADDING CAST COTTON 4X4 STRL (CAST SUPPLIES) ×2
PADDING CAST COTTON 6X4 STRL (CAST SUPPLIES) ×2 IMPLANT
SET HNDPC FAN SPRY TIP SCT (DISPOSABLE) ×1 IMPLANT
SPONGE GAUZE 4X4 12PLY (GAUZE/BANDAGES/DRESSINGS) ×2 IMPLANT
STAPLER VISISTAT 35W (STAPLE) ×2 IMPLANT
SUCTION FRAZIER TIP 10 FR DISP (SUCTIONS) ×2 IMPLANT
SUT ETHIBOND NAB CT1 #1 30IN (SUTURE) ×4 IMPLANT
SUT VIC AB 0 CT1 27 (SUTURE) ×4
SUT VIC AB 0 CT1 27XBRD ANBCTR (SUTURE) ×2 IMPLANT
SUT VIC AB 2-0 CT1 27 (SUTURE) ×4
SUT VIC AB 2-0 CT1 TAPERPNT 27 (SUTURE) ×2 IMPLANT
SYR CONTROL 10ML LL (SYRINGE) IMPLANT
TOWEL OR 17X24 6PK STRL BLUE (TOWEL DISPOSABLE) ×2 IMPLANT
TOWEL OR 17X26 10 PK STRL BLUE (TOWEL DISPOSABLE) ×2 IMPLANT
TRAY FOLEY CATH 14FR (SET/KITS/TRAYS/PACK) ×2 IMPLANT
WATER STERILE IRR 1000ML POUR (IV SOLUTION) ×6 IMPLANT

## 2011-07-21 NOTE — Anesthesia Preprocedure Evaluation (Addendum)
Anesthesia Evaluation  Patient identified by MRN, date of birth, ID band Patient awake    Reviewed: Allergy & Precautions, H&P , NPO status , Patient's Chart, lab work & pertinent test results  History of Anesthesia Complications Negative for: history of anesthetic complications  Airway Mallampati: II TM Distance: >3 FB Neck ROM: Full    Dental  (+) Teeth Intact, Chipped and Dental Advisory Given,    Pulmonary sleep apnea and Continuous Positive Airway Pressure Ventilation , Current Smoker,  breath sounds clear to auscultation        Cardiovascular hypertension, Pt. on medications Rhythm:Regular Rate:Tachycardia     Neuro/Psych Anxiety    GI/Hepatic (+) Hepatitis -? Hepatitis history   Endo/Other    Renal/GU      Musculoskeletal   Abdominal   Peds  Hematology   Anesthesia Other Findings   Reproductive/Obstetrics                           Anesthesia Physical Anesthesia Plan  ASA: III  Anesthesia Plan: General   Post-op Pain Management:    Induction: Intravenous  Airway Management Planned: Oral ETT  Additional Equipment:   Intra-op Plan:   Post-operative Plan: Extubation in OR  Informed Consent: I have reviewed the patients History and Physical, chart, labs and discussed the procedure including the risks, benefits and alternatives for the proposed anesthesia with the patient or authorized representative who has indicated his/her understanding and acceptance.   Dental advisory given  Plan Discussed with: Surgeon and CRNA  Anesthesia Plan Comments: (Htn ? H/O hepatitis PT, transaminases (-) Sleep apnea  Plan GA with [facility]  Kipp Brood, MD)        Anesthesia Quick Evaluation

## 2011-07-21 NOTE — Anesthesia Postprocedure Evaluation (Signed)
  Anesthesia Post-op Note  Patient: Rebecca Richards  Procedure(s) Performed: Procedure(s) (LRB): TOTAL KNEE ARTHROPLASTY (Left)  Patient Location: PACU  Anesthesia Type: General and GA combined with regional for post-op pain  Level of Consciousness: awake, alert  and oriented  Airway and Oxygen Therapy: Patient Spontanous Breathing and Patient connected to nasal cannula oxygen  Post-op Pain: mild  Post-op Assessment: Post-op Vital signs reviewed and Patient's Cardiovascular Status Stable  Post-op Vital Signs: stable  Complications: No apparent anesthesia complications

## 2011-07-21 NOTE — Progress Notes (Signed)
Orthopedic Tech Progress Note Patient Details:  Rebecca Richards 1945/11/25 [id number]  CPM Left Knee CPM Left Knee: On Left Knee Flexion (Degrees): 90  Left Knee Extension (Degrees): 0  Additional Comments: trapeze bar   Cammer, Mickie Bail 07/21/2011, 11:53 AM

## 2011-07-21 NOTE — Progress Notes (Signed)
Pt. Refused cpap for tonight. 

## 2011-07-21 NOTE — Preoperative (Signed)
Beta Blockers   Reason not to administer Beta Blockers:Not Applicable 

## 2011-07-21 NOTE — Progress Notes (Signed)
Rt took CPAP to patients room at this time. I spoke with her about her CPAP use at home. She stated that she "didn't wear it every night & she wasn't wearing it now." I explained to her that MD was concerned about her being so sleepy, and she still refused. ETCO2 on the Alaris pump was 49. RN made aware. RT will continue to monitor is she changes her mind.

## 2011-07-21 NOTE — Interval H&P Note (Signed)
History and Physical Interval Note:  07/21/2011 7:38 AM  Rebecca Richards  has presented today for surgery, with the diagnosis of osteoarthritis left knee  The various methods of treatment have been discussed with the patient and family. After consideration of risks, benefits and other options for treatment, the patient has consented to  Procedure(s) (LRB): TOTAL KNEE ARTHROPLASTY (Left) as a surgical intervention .  The patients' history has been reviewed, patient examined, no change in status, stable for surgery.  I have reviewed the patients' chart and labs.  Questions were answered to the patient's satisfaction.     Leatta Alewine JR,W D

## 2011-07-21 NOTE — Progress Notes (Signed)
Pt. States she did not get a call from short stay  reguarding starting mupirocin ointment. Mupirocin started when she arrived to short stay.

## 2011-07-21 NOTE — Progress Notes (Signed)
Utilization review completed.  

## 2011-07-21 NOTE — Op Note (Signed)
Dictated [phone]

## 2011-07-21 NOTE — Anesthesia Procedure Notes (Addendum)
Anesthesia Regional Block:  Femoral nerve block  Pre-Anesthetic Checklist: ,, timeout performed, Correct Patient, Correct Site, Correct Laterality, Correct Procedure,, site marked, risks and benefits discussed, Surgical consent,  Pre-op evaluation,  At surgeon's request and post-op pain management  Laterality: Left  Prep: chloraprep       Needles:  Injection technique: Single-shot  Needle Type: Echogenic Stimulator Needle     Needle Length: 9cm  Needle Gauge: 21    Additional Needles:  Procedures: nerve stimulator Femoral nerve block  Nerve Stimulator or Paresthesia:  Response: Quadriceps muscle contraction, 0.45 mA,   Additional Responses:   Narrative:  Start time: 07/21/2011 7:14 AM End time: 07/21/2011 7:25 AM Injection made incrementally with aspirations every 5 mL.  Performed by: Personally  Anesthesiologist: Dr Chaney Malling  Additional Notes: Functioning IV was confirmed and monitors were applied.  A 90mm 21ga Arrow echogenic stimulator needle was used. Sterile prep and drape,hand hygiene and sterile gloves were used.  Negative aspiration and negative test dose prior to incremental administration of local anesthetic. The patient tolerated the procedure well.    Femoral nerve block Procedure Name: Intubation Date/Time: 07/21/2011 7:54 AM Performed by: Romie Minus K Pre-anesthesia Checklist: Patient identified, Emergency Drugs available, Suction available, Patient being monitored and Timeout performed Patient Re-evaluated:Patient Re-evaluated prior to inductionOxygen Delivery Method: Circle system utilized Preoxygenation: Pre-oxygenation with 100% oxygen Intubation Type: IV induction Ventilation: Two handed mask ventilation required and Oral airway inserted - appropriate to patient size Laryngoscope Size: Miller and 2 Grade View: Grade II Tube type: Oral Tube size: 7.5 mm Number of attempts: 1 Airway Equipment and Method: Stylet Placement Confirmation: ETT  inserted through vocal cords under direct vision,  positive ETCO2 and breath sounds checked- equal and bilateral Secured at: 21 cm Tube secured with: Tape Dental Injury: Teeth and Oropharynx as per pre-operative assessment

## 2011-07-21 NOTE — Transfer of Care (Signed)
Immediate Anesthesia Transfer of Care Note  Patient: Rebecca Richards  Procedure(s) Performed: Procedure(s) (LRB): TOTAL KNEE ARTHROPLASTY (Left)  Patient Location: PACU  Anesthesia Type: General  Level of Consciousness: awake, oriented, sedated and patient cooperative  Airway & Oxygen Therapy: Patient Spontanous Breathing and Patient connected to face mask oxygen  Post-op Assessment: Report given to PACU RN and Post -op Vital signs reviewed and stable  Post vital signs: Reviewed  Complications: No apparent anesthesia complications

## 2011-07-21 NOTE — Brief Op Note (Signed)
07/21/2011  10:27 AM  PATIENT:  Rebecca Richards  66 y.o. female  PRE-OPERATIVE DIAGNOSIS:  osteoarthritis left knee  POST-OPERATIVE DIAGNOSIS:  osteoarthritis left knee  PROCEDURE:  Procedure(s) (LRB): TOTAL KNEE ARTHROPLASTY (Left)  SURGEON:  Surgeon(s) and Role:    * W D Carloyn Manner., MD - Primary  PHYSICIAN ASSISTANT:   ASSISTANTS: Ivin Booty A. Lonell Stamos, PA-C   ANESTHESIA:   general/block  EBL:  Total I/O In: 1500 [I.V.:1500] Out: 400 [Urine:300; Blood:100]  BLOOD ADMINISTERED:none  DRAINS: hemovac drain left knee, self suction  LOCAL MEDICATIONS USED:  NONE  SPECIMEN:  No Specimen  DISPOSITION OF SPECIMEN:  N/A  COUNTS:  YES  TOURNIQUET:   Total Tourniquet Time Documented: Thigh (Left) - 70 minutes  DICTATION: .Other Dictation: Dictation Number   PLAN OF CARE: Admit to inpatient   PATIENT DISPOSITION:  PACU - hemodynamically stable.   Delay start of Pharmacological VTE agent (>24hrs) due to surgical blood loss or risk of bleeding: yes

## 2011-07-21 NOTE — H&P (View-Only) (Signed)
 NAME: Rebecca Richards MRN: #[id number] DATE: July 14, 2011 DOB: 11/03/1945  COMPLAINT:     Follow up left knee osteoarthritis.  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  The patient is also in chronic pain management, currently taking Suboxone.  She has failed conservative treatment.  She is not a good candidate for oral NSAIDs.  She states she has received multiple injections in the knee from her primary care physician's office and Dr. Keeling's office.  She has previous x-rays from our office done on 06/02/11 documenting bone-on-bone arthritis of the left knee.  It is significantly affecting her quality of life and activities of daily living.  She has the most pain with weightbearing.  It will occasionally awaken her from sleep.  She is here today for further discussion regarding total knee replacement.  CURRENT MEDICATIONS:     ? furosemide.  Also includes lisinopril, methylphenidate, Suboxone, chlorpromazine, and potassium.  She sleeps with a CPAP at a setting of 14.0 at night.  ALLERGIES:     NKDA.  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  PAST SURGICAL HISTORY:     Knee surgery in 1981 and 1982, hysterectomy in 1982, and multiple back surgeries in 2002, 2009, and 2011.  HOSPITALIZATIONS:  Sleep apnea in 2005.  ROS:    Ten point systemic review is obtained and is positive for blurred vision, partial dentures, high blood pressure, constipation, hepatitis, nosebleeds, kidney cancer, skin rashes,  ankle swelling, kidney problems, night urinating, and sleep apnea. Please refer to patient information form for details.  FAMILY HISTORY:    Positive for hypertension in mother, brother, grandparents, and child; diabetes in mother, brother, and grandparents; stroke in grandparents; cancer in mother; and kidney disease in brother.  SOCIAL HISTORY:    Patient smokes 10-15 cigarettes daily.  She is not quite sure how long she has been doing this, but knows it has been for several years.  She states she does not drink alcohol.  She does have a history of drug and alcohol problems.  She lives with her son in a one-story residence.  She states she is retired.  EXAM:     The patient is seated in the examination room in no acute distress.  She is alert, oriented, and appears appropriate age.  Height 5 feet, 1 inch.  Weight 172 pounds.  BMI calculated at 32.5.  Temperature 97.6 degrees.  Pulse 106.  Respirations 20.   Skin:   Somewhat diffuse rash on the bilateral lower extremities mainly around the ankles which she describes as lichen planus.  There is no surrounding erythema or drainage.  The rash is not pruritic. HEENT:   PERRLA.  She states she did have some partial dentures, but is not wearing them today.  Neck is supple with good range of motion.   Chest/Lungs:   Normal breath sounds; clear to auscultation bilaterally.   Heart:   Regular rate and rhythm.  She does have a systolic murmur.   Abdomen:   Soft, non tender.  Active bowel sounds in all four quadrants. Rectal/Breast:      Not indicated for surgery. Neuro:   Cranial nerves grossly intact. Musculoskeletal:    Examination of the left lower extremity shows he is neurovascularly intact.  She has tenderness to palpation over the medial and lateral joint lines of the knee as well as patellofemoral crepitus.  She has limited range of motion.  She ambulates with an antalgic gait favoring the left leg.  X-RAYS:     No images were obtained today.  X-rays  taken of the left knee on 06/02/11 show severe bone-on-bone arthritis with medial translation of the femur on the tibia.  IMPRESSION:      1. Left knee osteoarthritis, end stage. 2. Hypertension. 3. Dyslipidemia. 4. Sleep apnea. 5. Chronic low back pain with implantable spinal stimulator. 6. History of drug and alcohol abuse, currently in chronic pain management with Suboxone.  RECOMMENDATIONS:     The risks and benefits of left total knee arthroplasty were discussed again today and the patient wishes to proceed with the surgery.  This is currently scheduled for 07/21/11.  She has obtained medical clearance from her primary care physician, Dr. Shah; cardiac clearance from Dr. Katz at [facility] (echocardiogram performed on 05/10/11); and her pain management doctor, Dr. Brenda Harris at [facility] who has instructed us to stop Suboxone on the day of surgery and okay for narcotic pain medications as needed, recommending possibly Dilaudid.  We have contacted [employer] who gave her the spinal stimulator.  They are sending a rep the morning of the scheduled surgery to turn off the stimulator.  We will anticipate skilled nursing facility for a short time for recovery following the surgery.  She will need CPAP at pressure of 14.0 at night.  We will need to follow up on preoperative labs regarding kidney function and anticoagulation therapy following surgery.  If she has any further questions or concerns prior to the day of surgery she may contact the office.   Josh Joshaua Epple P.A.-C/[id number]  Auto-Authenticated by Josh Javarus Dorner P.A.-C 

## 2011-07-22 LAB — CBC
HCT: 30.3 % — ABNORMAL LOW (ref 36.0–46.0)
Hemoglobin: 9.9 g/dL — ABNORMAL LOW (ref 12.0–15.0)
MCH: 28.4 pg (ref 26.0–34.0)
MCHC: 32.7 g/dL (ref 30.0–36.0)
MCV: 86.8 fL (ref 78.0–100.0)
Platelets: 223 K/uL (ref 150–400)
RBC: 3.49 MIL/uL — ABNORMAL LOW (ref 3.87–5.11)
RDW: 14.2 % (ref 11.5–15.5)
WBC: 9.7 K/uL (ref 4.0–10.5)

## 2011-07-22 LAB — BASIC METABOLIC PANEL WITH GFR
BUN: 8 mg/dL (ref 6–23)
CO2: 27 meq/L (ref 19–32)
Calcium: 8.4 mg/dL (ref 8.4–10.5)
Chloride: 98 meq/L (ref 96–112)
Creatinine, Ser: 0.91 mg/dL (ref 0.50–1.10)
GFR calc Af Amer: 76 mL/min — ABNORMAL LOW
GFR calc non Af Amer: 66 mL/min — ABNORMAL LOW
Glucose, Bld: 168 mg/dL — ABNORMAL HIGH (ref 70–99)
Potassium: 4.4 meq/L (ref 3.5–5.1)
Sodium: 135 meq/L (ref 135–145)

## 2011-07-22 MED ORDER — HYDROMORPHONE HCL PF 1 MG/ML IJ SOLN
0.5000 mg | INTRAMUSCULAR | Status: DC | PRN
  Administered 2011-07-22 – 2011-07-23 (×5): 0.5 mg via INTRAVENOUS
  Filled 2011-07-22 (×5): qty 1

## 2011-07-22 MED ORDER — OXYCODONE HCL 5 MG PO TABS
5.0000 mg | ORAL_TABLET | ORAL | Status: DC | PRN
  Administered 2011-07-22 – 2011-07-23 (×3): 5 mg via ORAL
  Filled 2011-07-22 (×3): qty 1

## 2011-07-22 MED ORDER — WHITE PETROLATUM GEL
Status: AC
  Administered 2011-07-22: 06:00:00
  Filled 2011-07-22: qty 5

## 2011-07-22 NOTE — Progress Notes (Signed)
Chart reviewed.  Pt. Is for SNF level rehab at discharge.  Will defer OT eval to SNF.  Jeani Hawking, OTR/L [phone]

## 2011-07-22 NOTE — Progress Notes (Signed)
Seen in room 5032  VS's Stable, Tmax 100.1 Hb-9.9  WBC - 9.7K  C/O pain in her knee  O/E: good foot ankle motion Left side  Assessment: stable  Plan: no change

## 2011-07-22 NOTE — Evaluation (Signed)
Physical Therapy Evaluation Patient Details Name: Rebecca Richards MRN: [id number] DOB: 1945/10/26 Today's Date: 07/22/2011 Time: 1308-6578 PT Time Calculation (min): 32 min  PT Assessment / Plan / Recommendation Clinical Impression  Pt is a 66 y.o. female who underwent L TKA 07-21-11.  She would benefit from skilled PT intervention to increase ROM/strength LLE, progress mobility/gait and provide education for LTG of independence.    PT Assessment  Patient needs continued PT services    Follow Up Recommendations  Skilled nursing facility    Equipment Recommendations  Defer to next venue    Frequency 7X/week    Precautions / Restrictions Precautions Precautions: Knee Required Braces or Orthoses: Knee Immobilizer - Left Knee Immobilizer - Left: On when out of bed or walking Restrictions Weight Bearing Restrictions: Yes LLE Weight Bearing: Weight bearing as tolerated   Pertinent Vitals/Pain       Mobility  Bed Mobility Bed Mobility: Supine to Sit Supine to Sit: 2: Max assist Details for Bed Mobility Assistance: verbal/tactile cues for sequencing, increased time required between each movement Transfers Transfers: Sit to Stand;Stand to Sit Sit to Stand: 3: Mod assist;From bed;With upper extremity assist Stand to Sit: 3: Mod assist;With armrests;To chair/3-in-1 Details for Transfer Assistance: verbal cues for sequencing, assist for forward weight shift during stance and assist to control descent Ambulation/Gait Ambulation/Gait Assistance: 3: Mod assist Ambulation Distance (Feet): 3 Feet Assistive device: Rolling walker Ambulation/Gait Assistance Details: verbal cues for sequencing, assist with progressing RW  Gait Pattern: Step-to pattern;Decreased stance time - left;Antalgic Gait velocity: decreased    Exercises Total Joint Exercises Goniometric ROM: 7-35 degrees L knee   PT Goals Acute Rehab PT Goals PT Goal Formulation: With patient Time For Goal Achievement:  07/29/11 Potential to Achieve Goals: Good Pt will go Supine/Side to Sit: with min assist PT Goal: Supine/Side to Sit - Progress: Goal set today Pt will go Sit to Supine/Side: with min assist PT Goal: Sit to Supine/Side - Progress: Goal set today Pt will go Sit to Stand: with supervision;with cues (comment type and amount);with upper extremity assist (min verbal cues) PT Goal: Sit to Stand - Progress: Goal set today Pt will go Stand to Sit: with supervision;with upper extremity assist;with cues (comment type and amount) (min verbal cues) PT Goal: Stand to Sit - Progress: Goal set today Pt will Transfer Bed to Chair/Chair to Bed: with min assist PT Transfer Goal: Bed to Chair/Chair to Bed - Progress: Goal set today Pt will Ambulate: 51 - 150 feet;with min assist;with rolling walker PT Goal: Ambulate - Progress: Goal set today  Visit Information  Last PT Received On: 07/22/11 Assistance Needed: +2    Subjective Data  Subjective: I don't know if I can do this. Patient Stated Goal: feel better   Prior Functioning  Home Living Lives With: Son Available Help at Discharge: Available PRN/intermittently;Family Type of Home: House Home Access: Stairs to enter Secretary/administrator of Steps: 1 Home Layout: One level Home Adaptive Equipment: Walker - rolling;Straight cane Prior Function Level of Independence: Independent with assistive device(s) Able to Take Stairs?: No Driving: No Vocation: Retired Musician: No difficulties    Cognition  Overall Cognitive Status: Appears within functional limits for tasks assessed/performed Arousal/Alertness: Awake/alert Orientation Level: Appears intact for tasks assessed Behavior During Session: [facility] for tasks performed    Extremity/Trunk Assessment     Balance    End of Session PT - End of Session Equipment Utilized During Treatment: Gait belt;Left knee immobilizer Activity Tolerance: Patient limited  by pain Patient left:  in chair;with call bell/phone within reach Nurse Communication: Mobility status CPM Left Knee CPM Left Knee: Off   Ilda Foil 07/22/2011, 11:31 AM  Aida Raider, PT  Office # [phone] Pager [phone]

## 2011-07-22 NOTE — Op Note (Signed)
Rebecca Richards, Rebecca Richards NO.:  [id number]  MEDICAL RECORD NO.:  [id number]  LOCATION:  5032                         FACILITY:  [facility]  PHYSICIAN:  Dyke Brackett, M.D.    DATE OF BIRTH:  11-Sep-1945  DATE OF PROCEDURE:  07/21/2011 DATE OF DISCHARGE:                              OPERATIVE REPORT   INDICATIONS:  This is a 66 year old with severe osteoarthritis of the left knee thought to be amenable to hospitalization for knee replacement.  PREOPERATIVE DIAGNOSIS:  Severe osteoarthritis of the left knee with varus deformity.  POSTOPERATIVE DIAGNOSIS:  Severe osteoarthritis of the left knee with varus deformity.  OPERATION:  Left total knee replacement (Cemented Sigma knee size 2.5, femur 3, tibia 10 mm bearing, 35 mm all poly patella).  SURGEON:  Dyke Brackett, M.D.  ASSISTANT:  Margart Sickles, PA-C.  TOURNIQUET TIME:  1 hour 27 minutes.  DESCRIPTION OF PROCEDURE:  Sterile prep and drape, exsanguination of leg, inflation to 350.  A straight skin incision was made with the medial parapatellar approach to the knee made.  We identified the most diseased medial compartment.  We cut about approximately 2 mm.  There was moderate loss particularly posteromedially.  We stripped the medial side and basically split the medial collateral ligament to balance the varus deformity.  Prior to this, we cut the tibia as I mentioned.  We then cut the distal femur with a 5-degree valgus cut, extension gap measured at 10 mm.  We then did sizing which we set it on 2.5, placed a block on the femoral side setting the referencing of the anterior cortex of the femur.  Then, allowed the 10 mm shim to set rotation and slight external rotation.  We then placed the cutting block for the anterior- posterior chamfer cuts, and cut those cuts, then measured the extension gap to equal the flexion gap at 10 mm.  Excess menisci were removed, complete release of the PCL, and we did remove some  posterior osteophytes particularly on the posterior medial femur.  Carolin Guernsey was cut for the tibia followed by the box cut on the femur.  We then placed trials onto the femur tibial area.  We did obtain full extension.  There was excellent stability, no tendency for ligamentous imbalance, no tendency for bearing spin out.  Patella was cut up to 35 mm patella, resected about 8.5 to 9 mm of bone.  Trials were again judged to be satisfactory.  The trials were removed.  The bony surfaces were irrigated.  We then inserted the tibia, femoral construct with a trial bearing on the final patella.  Again, the cement was allowed to harden, excess cement was removed.  All parameters again deemed to be acceptable.  The bearing was removed.  We checked the knee for excess cement in the posterior aspect of the knee and then released the tourniquet.  No excessive bleeding was noted in the posterior aspect of the knee.  We placed the final bearing, and then placed a Hemovac drain superolaterally.  Closure was affected with 1 Ethibond 2-0 Vicryl skin clips, Marcaine with epinephrine, and the skin lightly, pressure sterile dressing, knee immobilizer was applied.  Taken  to recovery room in stable condition.     Dyke Brackett, M.D.     WDC/MEDQ  D:  07/21/2011  T:  07/21/2011  Job:  [id number]

## 2011-07-23 LAB — CBC
HCT: 27.5 % — ABNORMAL LOW (ref 36.0–46.0)
MCHC: 32.7 g/dL (ref 30.0–36.0)
RDW: 14.1 % (ref 11.5–15.5)

## 2011-07-23 MED ORDER — OXYCODONE HCL 5 MG PO TABS
5.0000 mg | ORAL_TABLET | ORAL | Status: DC | PRN
  Administered 2011-07-23 (×2): 10 mg via ORAL
  Administered 2011-07-23: 5 mg via ORAL
  Administered 2011-07-24 (×4): 10 mg via ORAL
  Administered 2011-07-24: 5 mg via ORAL
  Administered 2011-07-25 (×4): 10 mg via ORAL
  Filled 2011-07-23 (×8): qty 2
  Filled 2011-07-23: qty 1
  Filled 2011-07-23 (×3): qty 2

## 2011-07-23 NOTE — Progress Notes (Signed)
Physical Therapy Treatment Patient Details Name: Rebecca Richards MRN: [id number] DOB: 03/22/1946 Today's Date: 07/23/2011 Time: 4098-1191 PT Time Calculation (min): 38 min  PT Assessment / Plan / Recommendation Comments on Treatment Session  Pt admitted s/p left TKA and was able to tolerate increased ambulation distance today with increased independence.  Pt remains motivated, but limited by pain.    Follow Up Recommendations  Skilled nursing facility    Equipment Recommendations  Defer to next venue    Frequency 7X/week   Plan Discharge plan remains appropriate;Frequency remains appropriate    Precautions / Restrictions Precautions Precautions: Knee Precaution Booklet Issued: No Required Braces or Orthoses: Knee Immobilizer - Left Knee Immobilizer - Left: On when out of bed or walking Restrictions Weight Bearing Restrictions: Yes LLE Weight Bearing: Weight bearing as tolerated    Pertinent Vitals/Pain 7/10 in left knee.  Pt premedicated and repositioned with ice applied after treatment.    Mobility  Bed Mobility Bed Mobility: Supine to Sit Supine to Sit: 3: Mod assist;HOB flat Details for Bed Mobility Assistance: Assist for left LE due to pain as well as trunk with max cues for sequence.  Limited by pain and lethargy initially. Transfers Transfers: Sit to Stand;Stand to Sit Sit to Stand: 3: Mod assist;With upper extremity assist;From bed;From chair/3-in-1 (2 trials.) Stand to Sit: 4: Min assist;With upper extremity assist;To chair/3-in-1 (2 trials.) Details for Transfer Assistance: Assist to translate trunk anterior with cues for hand placement and safety.  Assist to slow descent to surface as well as for hand/left LE placement. Ambulation/Gait Ambulation/Gait Assistance: 4: Min assist Ambulation Distance (Feet): 40 Feet (20 feet x 2 trials.) Assistive device: Rolling walker Ambulation/Gait Assistance Details: Assist for tall posture as well as safe sequence with  RW. Gait Pattern: Step-to pattern;Decreased stance time - left;Antalgic;Trunk flexed Stairs: No Wheelchair Mobility Wheelchair Mobility: No    Exercises Total Joint Exercises Ankle Circles/Pumps: AAROM;Left;10 reps;Supine Quad Sets: AAROM;Left;10 reps;Supine Heel Slides: AAROM;Left;10 reps;Supine Goniometric ROM: AA/ROM left knee 5-35 degrees.   PT Goals Acute Rehab PT Goals PT Goal Formulation: With patient Time For Goal Achievement: 07/29/11 Potential to Achieve Goals: Good PT Goal: Supine/Side to Sit - Progress: Progressing toward goal PT Goal: Sit to Stand - Progress: Progressing toward goal PT Goal: Stand to Sit - Progress: Progressing toward goal PT Goal: Ambulate - Progress: Progressing toward goal  Visit Information  Last PT Received On: 07/23/11 Assistance Needed: +1    Subjective Data  Subjective: "How much are we going to do?" Patient Stated Goal: feel better   Cognition  Overall Cognitive Status: Appears within functional limits for tasks assessed/performed Arousal/Alertness: Lethargic Orientation Level: Appears intact for tasks assessed Behavior During Session: [facility] for tasks performed    Balance  Balance Balance Assessed: No  End of Session PT - End of Session Equipment Utilized During Treatment: Gait belt;Left knee immobilizer Activity Tolerance: Patient tolerated treatment well;Patient limited by pain Patient left: in chair;with call bell/phone within reach Nurse Communication: Mobility status    Cephus Shelling 07/23/2011, 1:13 PM  07/23/2011 Cephus Shelling, PT, DPT [phone]

## 2011-07-23 NOTE — Progress Notes (Signed)
Clinical Social Work Department BRIEF PSYCHOSOCIAL ASSESSMENT 07/23/2011  Patient:  Rebecca Richards, Rebecca Richards     Account Number:  [id number]     Admit date:  07/21/2011  Clinical Social Worker:  Skip Mayer  Date/Time:  07/23/2011 10:00 AM  Referred by:  Physician  Date Referred:  07/23/2011 Referred for  SNF Placement   Other Referral:   Interview type:  Patient Other interview type:    PSYCHOSOCIAL DATA Living Status:  ALONE Admitted from facility:   Level of care:   Primary support name:  Lorne Primary support relationship to patient:  CHILD, ADULT Degree of support available:   [place], per pt    CURRENT CONCERNS Current Concerns  Post-Acute Placement   Other Concerns:    SOCIAL WORK ASSESSMENT / PLAN CSW met with pt re: PT recommendation for SNF. CSW explained placement process and answered questions. Pt requesting [facility] but agreeable to full [facility]. CSW will complete FL2 and initate SNF search. Weekday CSW to f/u with offers.   Assessment/plan status:  Information/Referral to [facility] Other assessment/ plan:   Information/referral to community resources:   SNF    PATIENT'S/FAMILY'S RESPONSE TO PLAN OF CARE: Pt reports agreeable to SNF in order to increase strength and independence with mobility/ADLs prior to return home. Pt verbalized understanding of d/c plan and appreciation for CSW assist.        Dellie Burns, MSW, LCSWA [phone] (weekend)

## 2011-07-23 NOTE — Progress Notes (Addendum)
Clinical Social Work Department CLINICAL SOCIAL WORK PLACEMENT NOTE 07/23/2011  Patient:  Rebecca Richards, Rebecca Richards  Account Number:  [id number] Admit date:  07/21/2011  Clinical Social Worker:  Skip Mayer  Date/time:  07/23/2011 10:00 AM  Clinical Social Work is seeking post-discharge placement for this patient at the following level of care:   SKILLED NURSING   (*CSW will update this form in Epic as items are completed)   07/23/2011  Patient/family provided with [facility] Department of Clinical Social Work's list of facilities offering this level of care within the geographic area requested by the patient (or if unable, by the patient's family).  07/23/2011  Patient/family informed of their freedom to choose among providers that offer the needed level of care, that participate in Medicare, Medicaid or managed care program needed by the patient, have an available bed and are willing to accept the patient.  07/23/2011  Patient/family informed of [facility]' ownership interest in [facility], as well as of the fact that they are under no obligation to receive care at this facility.  PASARR submitted to EDS on 07/23/2011 PASARR number received from EDS on 07/25/11. SMc  FL2 transmitted to all facilities in geographic area requested by pt/family on  07/23/2011 FL2 transmitted to all facilities within larger geographic area on   Patient informed that his/her managed care company has contracts with or will negotiate with  certain facilities, including the following:     Patient/family informed of bed offers received:  07/24/11 JB Patient chooses bed at [facility] Physician recommends and patient chooses bed at  [facility]  Patient to be transferred to [facility] on  07/25/11. SMc Patient to be transferred to facility by [facility]. SMc  The following physician request were entered in Epic:   Additional Comments:  Dellie Burns, MSW, LCSWA [phone] (weekend)  Dede Query,  MSW, LCSWA [phone] (weekday)

## 2011-07-23 NOTE — Progress Notes (Signed)
Seen in her room  C/O ++pain  O: in knee immobilizer  A: inadequate pain coverage  P: increase Oxycodone to 10 mg.

## 2011-07-24 ENCOUNTER — Encounter (HOSPITAL_COMMUNITY): Payer: Self-pay | Admitting: Orthopedic Surgery

## 2011-07-24 LAB — CBC
MCH: 29.1 pg (ref 26.0–34.0)
Platelets: 211 10*3/uL (ref 150–400)
RBC: 2.96 MIL/uL — ABNORMAL LOW (ref 3.87–5.11)
WBC: 11.6 10*3/uL — ABNORMAL HIGH (ref 4.0–10.5)

## 2011-07-24 LAB — BASIC METABOLIC PANEL
CO2: 29 mEq/L (ref 19–32)
Calcium: 9 mg/dL (ref 8.4–10.5)
GFR calc Af Amer: 79 mL/min — ABNORMAL LOW (ref >90)
Sodium: 138 mEq/L (ref 135–145)

## 2011-07-24 MED ORDER — OXYCODONE HCL 5 MG PO TABS: ORAL_TABLET | ORAL | Status: DC

## 2011-07-24 MED ORDER — METHOCARBAMOL 500 MG PO TABS: 500.0000 mg | ORAL_TABLET | Freq: Four times a day (QID) | ORAL | Status: AC | PRN

## 2011-07-24 MED ORDER — ENOXAPARIN SODIUM 30 MG/0.3ML ~~LOC~~ SOLN: 30.0000 mg | Freq: Two times a day (BID) | SUBCUTANEOUS | Status: DC

## 2011-07-24 MED ORDER — POLYETHYLENE GLYCOL 3350 17 G PO PACK
17.0000 g | PACK | Freq: Every day | ORAL | Status: DC | PRN
  Administered 2011-07-24: 17 g via ORAL
  Filled 2011-07-24: qty 1

## 2011-07-24 NOTE — Discharge Instructions (Signed)
Diet: As you were doing prior to hospitalization  Activity:  Increase activity slowly as tolerated                  No lifting or driving for 6 weeks  Shower:  May shower without a dressing starting Wed 07/26/11  Dressing:  You may change your dressing on daily  Weight Bearing:   weight bearing as taught in physical therapy.  Use a walker or                    Crutches as instructed.  To prevent constipation: you may use a stool softener such as -               Colace ( over the counter) 100 mg by mouth twice a day                Drink plenty of fluids ( prune juice may be helpful) and high fiber foods                Miralax ( over the counter) for constipation as needed.    Precautions:  If you experience chest pain or shortness of breath - call 911 immediately               For transfer to the hospital emergency department!!               If you develop a fever greater that 101 F, purulent drainage from wound,                             increased redness or drainage from wound, or calf pain -- Call the office at                                                 [phone].  Follow- Up Appointment:  Please call for an appointment to be seen on 08/03/2011               [facility] - [phone]               [facility] - [phone]               [place] - [phone]

## 2011-07-24 NOTE — Progress Notes (Signed)
Subjective: 3 Days Post-Op Procedure(s) (LRB): TOTAL KNEE ARTHROPLASTY (Left) Patient reports pain as mild.    Objective: Vital signs in last 24 hours: Temp:  [98.2 F (36.8 C)-99.8 F (37.7 C)] 99.8 F (37.7 C) (04/29 0538) Pulse Rate:  [72-120] 112  (04/29 0931) Resp:  [18-20] 20  (04/29 0931) BP: (104-144)/(65-83) 104/65 mmHg (04/29 0931) SpO2:  [98 %-100 %] 98 % (04/29 0538)  Intake/Output from previous day: 04/28 0701 - 04/29 0700 In: 240 [P.O.:240] Out: 0  Intake/Output this shift: Total I/O In: 480 [P.O.:480] Out: -    Basename 07/24/11 0540 07/23/11 0525 07/22/11 0638  HGB 8.6* 9.0* 9.9*    Basename 07/24/11 0540 07/23/11 0525  WBC 11.6* 11.6*  RBC 2.96* 3.16*  HCT 25.3* 27.5*  PLT 211 211    Basename 07/24/11 1137 07/22/11 0638  NA 138 135  K 3.6 4.4  CL 98 98  CO2 29 27  BUN 8 8  CREATININE 0.88 0.91  GLUCOSE 127* 168*  CALCIUM 9.0 8.4   No results found for this basename: LABPT:2,INR:2 in the last 72 hours  Neurovascular intact Sensation intact distally Intact pulses distally Dorsiflexion/Plantar flexion intact Incision: dressing C/D/I and scant drainage No cellulitis present Compartment soft Hemovac drain pulled  Assessment/Plan: 3 Days Post-Op Procedure(s) (LRB): TOTAL KNEE ARTHROPLASTY (Left) Discharge to SNF  Rebecca Richards 07/24/2011, 1:27 PM

## 2011-07-24 NOTE — Discharge Summary (Signed)
[address] [place], [place], [place]  [address]                             [phone]  PATIENT ID: Rebecca Richards        MRN:  [id number]          DOB/AGE: 1945/07/15 / 66 y.o.    DISCHARGE SUMMARY  ADMISSION DATE:    07/21/2011 DISCHARGE DATE:   07/24/2011   ADMISSION DIAGNOSIS: osteoarthritis left knee    DISCHARGE DIAGNOSIS:  osteoarthritis left knee    ADDITIONAL DIAGNOSIS: Active Problems:  * No active hospital problems. *   Past Medical History  Diagnosis Date  . Depression   . Brain tumor     Surgery for benign brain tumor in the remote past  . Hypertension   . Renal disorder   . Preop cardiovascular exam     Cardiac clearance for knee surgery, January, 2013  . Edema   . Tobacco abuse   . Ejection fraction     EF 70%, echo, February, 2013  . Sleep apnea     uses Cpap  . Hepatitis 1970's    hep c  . 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 Anxiety   . Arthritis   . 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 Nasal congestion     PROCEDURE: Procedure(s): TOTAL KNEE ARTHROPLASTY on 07/21/2011  CONSULTS:   Respiratory Therapy for CPAP, PT/OT  HISTORY:  See H&P in chart  HOSPITAL COURSE:  Rebecca Richards is a 66 y.o. admitted on 07/21/2011 and found to have a diagnosis of osteoarthritis left knee.  After appropriate laboratory studies were obtained  they were taken to the operating room on 07/21/2011 and underwent Procedure(s): TOTAL KNEE ARTHROPLASTY.   They were given perioperative antibiotics:  Anti-infectives     Start     Dose/Rate Route Frequency Ordered Stop   07/20/11 1420   ceFAZolin (ANCEF) IVPB 1 g/50 mL premix        1 g 100 mL/hr over 30 Minutes Intravenous 60 min pre-op 07/20/11 1420 07/21/11 0745        .  Tolerated the procedure well.  Placed with a foley intraoperatively.  Given Ofirmev at induction and for 24 hours.    POD #1, allowed out of bed to a chair.  PT for ambulation and exercise program.  Foley D/C'd in morning.  IV saline locked.  O2  discontionued.  POD #2, continued PT and ambulation.   Hemovac pulled. . The remainder of the hospital course was dedicated to ambulation and strengthening.   The patient was discharged on 3 Days Post-Op in  Stable condition.  Blood products given:none  DIAGNOSTIC STUDIES: Recent vital signs: Patient Vitals for the past 24 hrs:  BP Temp Temp src Pulse Resp SpO2  07/24/11 0538 129/75 mmHg 99.8 F (37.7 C) - 72  18  98 %  2011-08-02 2243 142/83 mmHg 99.3 F (37.4 C) - 119  18  99 %  08/02/11 1504 144/74 mmHg 98.2 F (36.8 C) Oral 120  20  100 %       Recent laboratory studies:  Basename 07/24/11 0540 08-02-2011 0525 07/22/11 0638 07/19/11 1510  WBC 11.6* 11.6* 9.7 6.0  HGB 8.6* 9.0* 9.9* 12.7  HCT 25.3* 27.5* 30.3* 38.7  PLT 211 211 223 249    Basename 07/22/11 0638 07/19/11 1510  NA 135 141  K 4.4 4.1  CL 98 100  CO2 27 30  BUN 8 7  CREATININE 0.91 1.01  GLUCOSE 168* 206*  CALCIUM 8.4 9.6   Lab Results  Component Value Date   INR 0.92 07/19/2011     Recent Radiographic Studies :  Dg Chest 2 View  06/30/2011  *RADIOLOGY REPORT*  Clinical Data: Weakness.  Smoker.  CHEST - 2 VIEW  Comparison: Plain films of the chest 04/23/2008.  Findings: The lungs are clear.  Heart size is upper normal.  No pneumothorax or pleural effusion.  No focal bony abnormality. Spinal stimulator device noted.  IMPRESSION: No acute disease.  Original Report Authenticated By: Bernadene Bell. Maricela Curet, M.D.   Ct Abdomen Pelvis W Contrast  06/30/2011  *RADIOLOGY REPORT*  Clinical Data: Nausea, vomiting, fatigue, decreased appetite and weakness, history of renal carcinoma  CT ABDOMEN AND PELVIS WITH CONTRAST  Technique:  Multidetector CT imaging of the abdomen and pelvis was performed following the standard protocol during bolus administration of intravenous contrast.  Contrast: OMNIPAQUE IOHEXOL 300 MG/ML  SOLN  Comparison: CT abdomen pelvis of 08/20/2004.  Findings: The lung bases are clear.  The liver  enhances with no focal abnormality and no ductal dilatation is seen.  There is a large gallstone within the gallbladder measuring 3.5 cm in maximum diameter containing some gas centrally.  The gallbladder wall is slightly prominent and ultrasound may be helpful to assess for developing acute cholecystitis.  No ductal dilatation is seen.  The pancreas is normal in size and the pancreatic duct is not dilated. The adrenal glands and spleen are unremarkable.  The stomach is decompressed and cannot be evaluated.  The kidneys enhance with no calculus or hydronephrosis.  The previously noted low attenuation mass in the lower pole of the right kidney medially is relatively stable measuring 13 mm in maximum diameter. This low attenuation right renal lesion measures 24 HU on the portal venous phase, and 12 HU on the delayed images.  The abdominal aorta is normal in caliber with mild atheromatous change present.  Neurostimulator electrodes are noted in the lower thoracic spinal canal.  No adenopathy is seen.  The urinary bladder is not well distended.  The uterus has previously been resected.  No adnexal lesion is seen.  No fluid is noted within the pelvis.  The colon is largely decompressed and difficult to evaluate.  No definite mass is seen.  The appendix and terminal ileum are unremarkable. Lumbar spine fusion is noted at the L4-5 level.  IMPRESSION:  1.  3.5 cm gallstone within the gallbladder.  The gallbladder wall is slightly irregular.  Cannot exclude developing acute cholecystitis. 2.  No significant change in low attenuation lower pole right renal lesion when compared to the CT from 2006. 3.  The appendix and terminal ileum are unremarkable.  Original Report Authenticated By: Juline Patch, M.D.   X-ray Knee Left Port  07/21/2011  *RADIOLOGY REPORT*  Clinical Data: Postop arthroplasty  PORTABLE LEFT KNEE - 1-2 VIEW  Comparison: 04/17/2011  Findings: Surgical clips are identified overlying the left lateral knee  joint and a surgical drain is in place with the tip in the region of the lateral patellofemoral joint. Proximal tibial and distal femoral prosthetic components are seen postoperatively.  No adverse features are identified.  Mild postoperative subcutaneous emphysema is seen in the anterior and lateral soft tissues adjacent to the knee.  IMPRESSION: Postoperative left arthroplasty with no adverse features evident.  Original Report Authenticated By: Lurena Joiner  S. Kyung Rudd, M.D.    DISCHARGE INSTRUCTIONS:   DISCHARGE MEDICATIONS:   Medication List  As of 07/24/2011 10:11 AM   ASK your doctor about these medications         ALPRAZolam 1 MG tablet   Commonly known as: XANAX   Take 1 mg by mouth 3 (three) times daily as needed. For anxiety or sleep      amLODipine 10 MG tablet   Commonly known as: NORVASC   Take 10 mg by mouth daily.      buPROPion 150 MG 12 hr tablet   Commonly known as: WELLBUTRIN SR   Take 150 mg by mouth 2 (two) times daily.      chlorproMAZINE 10 MG tablet   Commonly known as: THORAZINE   Take 10 mg by mouth at bedtime and may repeat dose one time if needed.      diphenhydrAMINE 25 MG tablet   Commonly known as: BENADRYL   Take 50 mg by mouth at bedtime.      lisinopril 10 MG tablet   Commonly known as: PRINIVIL,ZESTRIL   Take 10 mg by mouth daily.      Melatonin 10 MG Tabs   Take 10 mg by mouth at bedtime.      promethazine 25 MG tablet   Commonly known as: PHENERGAN   Take 12.5 mg by mouth every 6 (six) hours as needed. For nausea      sodium chloride 0.65 % Soln nasal spray   Commonly known as: OCEAN   Place 1 spray into the nose as needed. For dry nose            FOLLOW UP VISIT:   Follow-up Information    Call CAFFREY JR,W D, MD. (to make appointment on 08/03/2011)    Contact information:   [facility]y [facility], [facility] [address] [place] [address] [phone]          DISPOSITION:  Skilled nursing  facility    CONDITION:  Stable   Margart Sickles 07/24/2011, 10:11 AM

## 2011-07-24 NOTE — Progress Notes (Signed)
PT Progress Note:     07/24/11 1300  PT Visit Information  Last PT Received On 07/24/11  Assistance Needed +1  PT Time Calculation  PT Start Time 0858  PT Stop Time 0932  PT Time Calculation (min) 34 min  Subjective Data  Subjective "How far do we have to go?  I'm tired"  Precautions  Precautions Knee  Precaution Booklet Issued No  Required Braces or Orthoses Knee Immobilizer - Left  Knee Immobilizer - Left On when out of bed or walking  Restrictions  LLE Weight Bearing WBAT  Cognition  Overall Cognitive Status Appears within functional limits for tasks assessed/performed  Arousal/Alertness Awake/alert  Orientation Level Appears intact for tasks assessed  Behavior During Session [facility] for tasks performed  Bed Mobility  Bed Mobility Supine to Sit  Supine to Sit 4: Min assist;HOB flat  Details for Bed Mobility Assistance (A) for LLE  Transfers  Transfers Sit to Stand;Stand to Sit  Sit to Stand 4: Min assist;With upper extremity assist;From bed;From chair/3-in-1;With armrests  Stand to Sit 4: Min assist;With upper extremity assist;With armrests;To chair/3-in-1  Details for Transfer Assistance (A) to achieve standing, control descent, & safety.  Cues for hand placement & LLE positioning  Ambulation/Gait  Ambulation/Gait Assistance 4: Min guard  Ambulation Distance (Feet) 50 Feet (25' x's 2)  Assistive device Rolling walker  Ambulation/Gait Assistance Details Max encouragement to increase distance, cues for sequencing, upright posture, increase heel strike.  Pt seems to limit herself- c/o "im tired" rather than c/o pain.    Gait Pattern Step-to pattern;Decreased stance time - left;Decreased step length - right  Stairs No  Wheelchair Mobility  Wheelchair Mobility No  Balance  Balance Assessed No  Exercises  Exercises Total Joint  Total Joint Exercises  Ankle Circles/Pumps AROM;Both;Supine;15 reps  [employer] AROM;Strengthening;15 reps;Supine;Both  Straight Leg Raises  AAROM;Strengthening;10 reps;Supine  PT - End of Session  Equipment Utilized During Treatment Gait belt;Left knee immobilizer  Activity Tolerance Patient tolerated treatment well;Patient limited by fatigue  Patient left in chair;with call bell/phone within reach  PT - Assessment/Plan  Comments on Treatment Session Pt made progression with PT goals today but progressing slowly.  Requires strong encouragement to increase ambulation distance.    PT Plan Discharge plan remains appropriate;Frequency remains appropriate  PT Frequency 7X/week  Follow Up Recommendations Skilled nursing facility  Equipment Recommended Defer to next venue  Acute Rehab PT Goals  PT Goal: Supine/Side to Sit - Progress Met  PT Goal: Sit to Stand - Progress Progressing toward goal  PT Goal: Stand to Sit - Progress Progressing toward goal  PT Goal: Ambulate - Progress Progressing toward goal       Verdell Face, [place] [phone] 07/24/2011

## 2011-07-24 NOTE — Progress Notes (Signed)
PT Progress Note:     07/24/11 1400  PT Visit Information  Last PT Received On 07/24/11  Assistance Needed +1  PT Time Calculation  PT Start Time 1326  PT Stop Time 1358  PT Time Calculation (min) 32 min  Precautions  Precautions Knee  Required Braces or Orthoses Knee Immobilizer - Left  Knee Immobilizer - Left On when out of bed or walking  Restrictions  LLE Weight Bearing WBAT  Cognition  Overall Cognitive Status Appears within functional limits for tasks assessed/performed  Arousal/Alertness Awake/alert  Orientation Level Appears intact for tasks assessed  Behavior During Session [facility] for tasks performed  Bed Mobility  Bed Mobility Supine to Sit;Sit to Supine  Supine to Sit 4: Min assist;HOB flat  Sit to Supine 4: Min assist;HOB flat  Details for Bed Mobility Assistance (A) for LLE, max cues for body positioning once once pt lying supine in bed due to pt at diagonal angle to bed.  Cues for technique to increase ease of transitioning with increased use of UE"s.    Transfers  Transfers Sit to Stand;Stand to Sit  Sit to Stand 4: Min assist;With upper extremity assist;From bed  Stand to Sit 4: Min guard;With upper extremity assist;To bed  Details for Transfer Assistance Pt required increased Min (A) to achieve standing from bed this PM compared to AM session- not quite as smooth transition.  Cues for hand placement & technique.    Ambulation/Gait  Ambulation/Gait Assistance 4: Min guard  Ambulation Distance (Feet) 50 Feet (with no seated rest break)  Assistive device Rolling walker  Ambulation/Gait Assistance Details Pt only agreeable to ambulate to same destination as she did in AM session but she was able to complete distance without a seated rest break this PM.  Continues to require cues for sequencing, max encouragement, & increased lateral wt shifting to advance LLE due to KI  Gait Pattern Step-through pattern;Decreased stance time - left;Decreased step length - right    Stairs No  Wheelchair Mobility  Wheelchair Mobility No  Balance  Balance Assessed No  Exercises  Exercises Total Joint  Total Joint Exercises  Goniometric ROM PROM 68 degrees L knee flexion  Ankle Circles/Pumps AROM;Both;10 reps;Supine  Quad Sets AROM;Strengthening;Both;10 reps;Supine  Straight Leg Raises AAROM;Strengthening;Left;Other reps (comment);Supine (12 reps)  Knee Flexion PROM;Left;10 reps;Seated;Limitations  Knee Flexion Limitations seems to be limited by pain  PT - End of Session  Equipment Utilized During Treatment Gait belt;Left knee immobilizer  Activity Tolerance Patient tolerated treatment well;Patient limited by fatigue  Patient left in bed;in CPM;with call bell/phone within reach  PT - Assessment/Plan  Comments on Treatment Session Pt placed back on CPM at end of session (0-60 degrees).    PT Plan Discharge plan remains appropriate;Frequency remains appropriate  PT Frequency 7X/week  Follow Up Recommendations Skilled nursing facility  Equipment Recommended Defer to next venue  Acute Rehab PT Goals  PT Goal: Supine/Side to Sit - Progress Met  PT Goal: Sit to Supine/Side - Progress Progressing toward goal  PT Goal: Sit to Stand - Progress Not met  PT Goal: Stand to Sit - Progress Not met  PT Goal: Ambulate - Progress Met      Verdell Face, PTA [phone] 07/24/2011

## 2011-07-25 NOTE — Progress Notes (Signed)
CARE MANAGEMENT NOTE 07/25/2011  Patient:  Rebecca Richards, Rebecca Richards   Account Number:  [id number]  Date Initiated:  07/25/2011  Documentation initiated by:  Vance Peper  Subjective/Objective Assessment:   66 yr old female s/p left total knee arthroplasty     Action/Plan:   Patient will be going to [facility] in [place] for shortterm rehab.   Anticipated DC Date:  07/25/2011   Anticipated DC Plan:  SKILLED NURSING FACILITY  In-house referral  Clinical Social Worker      DC Planning Services  CM consult      [facility] Choice  NA   Choice offered to / List presented to:  NA   DME arranged  NA      DME agency  NA     HH arranged  NA      HH agency  NA   Status of service:  Completed, signed off  Discharge Disposition:  SKILLED NURSING FACILITY

## 2011-07-25 NOTE — Progress Notes (Signed)
Pt is ready for discharge today to [facility] [place]. Facility has received discharge summary and is ready to admit pt. Pt is agreeable to discharge plan and shared that she will call her son when she arrives at [facility]. [facility] will provide transportation. CSW is signing off as no further needs identified.   Dede Query, MSW, Theresia Majors [phone]

## 2011-07-25 NOTE — Progress Notes (Signed)
PT Progress Note:     07/25/11 1400  PT Visit Information  Last PT Received On 07/25/11  Assistance Needed +1  PT Time Calculation  PT Start Time 1032  PT Stop Time 1109  PT Time Calculation (min) 37 min  Precautions  Precautions Knee  Required Braces or Orthoses Knee Immobilizer - Left  Knee Immobilizer - Left On when out of bed or walking  Restrictions  LLE Weight Bearing WBAT  Cognition  Overall Cognitive Status Appears within functional limits for tasks assessed/performed  Arousal/Alertness Awake/alert  Orientation Level Appears intact for tasks assessed  Behavior During Session [facility] for tasks performed  Bed Mobility  Bed Mobility Supine to Sit  Supine to Sit 4: Min assist;HOB flat  Details for Bed Mobility Assistance (A) to position LLE in hooking position with RLE.    Transfers  Transfers Sit to Stand;Stand to Sit  Sit to Stand 4: Min guard;With upper extremity assist;From bed  Stand to Sit 4: Min assist;With upper extremity assist;To chair/3-in-1;With armrests  Details for Transfer Assistance Cues for hand placement, LLE positioning.    Ambulation/Gait  Ambulation/Gait Assistance 5: Supervision  Ambulation Distance (Feet) 60 Feet  Assistive device Rolling walker  Ambulation/Gait Assistance Details Pt ambulated without KI today- no knee buckling noted.  Cues for increased heel strike LLE & safe management of RW.    Gait Pattern Step-through pattern;Decreased step length - right;Decreased stance time - left  Stairs No  Wheelchair Mobility  Wheelchair Mobility No  Balance  Balance Assessed No  Exercises  Exercises Total Joint  Total Joint Exercises  Ankle Circles/Pumps AROM;Both;10 reps;Supine  Quad Sets AROM;Strengthening;Both;10 reps;Supine  Straight Leg Raises AAROM;Strengthening;Left;15 reps;Supine  Long Arc Quad AAROM;Strengthening;Left;10 reps;Seated  Knee Flexion AAROM;Left;10 reps;Seated  Knee Flexion Limitations seems to be limited by pain  PT - End of  Session  Equipment Utilized During Treatment Gait belt  Activity Tolerance Patient tolerated treatment well;Patient limited by fatigue  Patient left in chair;with call bell/phone within reach  PT - Assessment/Plan  PT Plan Discharge plan remains appropriate;Frequency remains appropriate  PT Frequency 7X/week  Follow Up Recommendations Skilled nursing facility  Equipment Recommended Defer to next venue  Acute Rehab PT Goals  PT Goal: Supine/Side to Sit - Progress Met  PT Goal: Sit to Stand - Progress Progressing toward goal  PT Goal: Stand to Sit - Progress Not met  PT Goal: Ambulate - Progress Progressing toward goal      Verdell Face, PTA [phone] 07/25/2011

## 2011-09-09 ENCOUNTER — Emergency Department (HOSPITAL_COMMUNITY)
Admission: EM | Admit: 2011-09-09 | Discharge: 2011-09-09 | Disposition: A | Discharge status: Home / Self Care | Payer: Medicare Other | Attending: Emergency Medicine | Admitting: Emergency Medicine

## 2011-09-09 ENCOUNTER — Encounter (HOSPITAL_COMMUNITY): Payer: Self-pay | Admitting: *Deleted

## 2011-09-09 DIAGNOSIS — F17200 Nicotine dependence, unspecified, uncomplicated: Secondary | ICD-10-CM | POA: Insufficient documentation

## 2011-09-09 DIAGNOSIS — B1920 Unspecified viral hepatitis C without hepatic coma: Secondary | ICD-10-CM | POA: Insufficient documentation

## 2011-09-09 DIAGNOSIS — N289 Disorder of kidney and ureter, unspecified: Secondary | ICD-10-CM | POA: Insufficient documentation

## 2011-09-09 DIAGNOSIS — I10 Essential (primary) hypertension: Secondary | ICD-10-CM | POA: Insufficient documentation

## 2011-09-09 DIAGNOSIS — G8929 Other chronic pain: Secondary | ICD-10-CM

## 2011-09-09 DIAGNOSIS — M25469 Effusion, unspecified knee: Secondary | ICD-10-CM | POA: Insufficient documentation

## 2011-09-09 DIAGNOSIS — G4730 Sleep apnea, unspecified: Secondary | ICD-10-CM | POA: Insufficient documentation

## 2011-09-09 MED ORDER — HYDROCODONE-ACETAMINOPHEN 10-500 MG PO TABS: 1.0000 | ORAL_TABLET | Freq: Four times a day (QID) | ORAL | Status: AC | PRN

## 2011-09-09 MED ORDER — HYDROMORPHONE HCL PF 2 MG/ML IJ SOLN
2.0000 mg | Freq: Once | INTRAMUSCULAR | Status: AC
  Administered 2011-09-09: 2 mg via INTRAMUSCULAR
  Filled 2011-09-09: qty 1

## 2011-09-09 MED ORDER — KETOROLAC TROMETHAMINE 60 MG/2ML IM SOLN
60.0000 mg | Freq: Once | INTRAMUSCULAR | Status: AC
  Administered 2011-09-09: 60 mg via INTRAMUSCULAR
  Filled 2011-09-09: qty 2

## 2011-09-09 NOTE — ED Notes (Signed)
States she had a left knee replacement 5 weeks ago and is having a lot of pain

## 2011-09-09 NOTE — ED Provider Notes (Signed)
History   This chart was scribed for Eretria Manternach B. Bernette Mayers, MD scribed by Magnus Sinning. The patient was seen in room APA04/APA04 seen at 18:47    CSN: [id number]  Arrival date & time 09/09/11  4098   First MD Initiated Contact with Patient 09/09/11 1842      Chief Complaint  Patient presents with  . Knee Pain    (Consider location/radiation/quality/duration/timing/severity/associated sxs/prior treatment) HPI Rebecca Richards is a 66 y.o. female who presents to the Emergency Department complaining of constant moderate left knee pain, onset 5 weeks. States that the pain has been chronic for thirty years and unchanged since she had a left knee replacement 5 weeks ago. Additionally reports that she has been having rehab and in-home physical therapy for knee problems, which she states has been going well.  She reports that she previously has used vicodin, fentanyl patches, and percocet for treatment of pain. Says she has a hx of back problems for 25 years, when she discovered knee problems. Pain is worse with movement but she is able to participate in physical therapy.  Denies fever, or any other associated sxs.   Past Medical History  Diagnosis Date  . Depression   . Brain tumor     Surgery for benign brain tumor in the remote past  . Hypertension   . Renal disorder   . Preop cardiovascular exam     Cardiac clearance for knee surgery, January, 2013  . Edema   . Tobacco abuse   . Ejection fraction     EF 70%, echo, February, 2013  . Sleep apnea     uses Cpap  . Hepatitis 1970's    hep c  . 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 Anxiety   . Arthritis   . 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 Nasal congestion     Past Surgical History  Procedure Date  . Abdominal hysterectomy   . Knee surgery   . Nose surgery   . Back surgery     pt has stimulator in lower back and can't have an MRI  . 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 Cancer Mother   . Hypertension Mother   . Anesthesia problems Neg Hx     History  Substance Use Topics  . Smoking status: Current Everyday Smoker -- 0.5 packs/day for 10 years    Types: Cigarettes  . Smokeless tobacco: Never Used   Comment: has cut down to 2 cigarettes daily  . Alcohol Use: No     none since 03-28-2011    OB History    Grav Para Term Preterm Abortions TAB SAB Ect Mult Living   1 1 1       1       Review of Systems 10 Systems reviewed and are negative for acute change except as noted in the HPI. Allergies  Review of patient's allergies indicates no known allergies.  Home Medications   Current Outpatient Rx  Name Route Sig Dispense Refill  . ALPRAZOLAM 1 MG PO TABS Oral Take 1 mg by mouth 3 (three) times daily as needed. For anxiety or sleep    . AMLODIPINE BESYLATE 10 MG PO TABS Oral Take  10 mg by mouth daily.    . BUPROPION HCL ER (SR) 150 MG PO TB12 Oral Take 150 mg by mouth 2 (two) times daily.     . CHLORPROMAZINE HCL 10 MG PO TABS Oral Take 10 mg by mouth at bedtime and may repeat dose one time if needed.    Marland Kitchen ENOXAPARIN SODIUM 30 MG/0.3ML Susank SOLN Subcutaneous Inject 0.3 mLs (30 mg total) into the skin every 12 (twelve) hours. 22 Syringe 0  . LISINOPRIL 10 MG PO TABS Oral Take 10 mg by mouth daily.    . OXYCODONE HCL 5 MG PO TABS  1-3 tabs po q4-6hrs prn pain 90 tablet 0  . PROMETHAZINE HCL 25 MG PO TABS Oral Take 12.5 mg by mouth every 6 (six) hours as needed. For nausea    . SALINE 0.65 % NA SOLN Nasal Place 1 spray into the nose as needed. For dry nose      BP 129/84  Temp 99.2 F (37.3 C) (Oral)  Resp 20  Ht 5\' 1"  (1.549 m)  Wt 163 lb (73.936 kg)  BMI 30.80 kg/m2  SpO2 97%  Physical Exam  Nursing note and vitals reviewed. Constitutional: She is oriented to person, place, and time.  She appears well-developed and well-nourished. No distress.  HENT:  Head: Normocephalic and atraumatic.  Eyes: EOM are normal. Pupils are equal, round, and reactive to light.  Neck: Neck supple. No tracheal deviation present.  Cardiovascular: Normal rate.   Pulmonary/Chest: Effort normal. No respiratory distress.  Musculoskeletal: Normal range of motion. She exhibits edema.       Left knee swollen with no signs of infection  Neurological: She is alert and oriented to person, place, and time. No sensory deficit.  Skin: Skin is warm and dry.  Psychiatric: She has a normal mood and affect. Her behavior is normal.    ED Course  Procedures (including critical care time) DIAGNOSTIC STUDIES: Oxygen Saturation is 97% on room air, normal by my interpretation.    COORDINATION OF CARE:  Labs Reviewed - No data to display No results found.   No diagnosis found.    MDM  Pt here for pain control. Was previously using fentanyl patches, but doctor did not refill Rx. Taking Oxy IR with minimal improvement. Was previously on Suboxone. I personally performed the services described in the documentation, which were scribed in my presence. The recorded information has been reviewed and considered.          Rhyen Mazariego B. Bernette Mayers, MD 09/09/11 1945

## 2011-09-09 NOTE — ED Notes (Signed)
States she had been on Vicodin for over 25 years and Percocet is not helping

## 2011-09-09 NOTE — Discharge Instructions (Signed)
Knee Pain The knee is the complex joint between your thigh and your lower leg. It is made up of bones, tendons, ligaments, and cartilage. The bones that make up the knee are:  The femur in the thigh.   The tibia and fibula in the lower leg.   The patella or kneecap riding in the groove on the lower femur.  CAUSES  Knee pain is a common complaint with many causes. A few of these causes are:  Injury, such as:   A ruptured ligament or tendon injury.   Torn cartilage.   Medical conditions, such as:   Gout   Arthritis   Infections   Overuse, over training or overdoing a physical activity.  Knee pain can be minor or severe. Knee pain can accompany debilitating injury. Minor knee problems often respond well to self-care measures or get well on their own. More serious injuries may need medical intervention or even surgery. SYMPTOMS The knee is complex. Symptoms of knee problems can vary widely. Some of the problems are:  Pain with movement and weight bearing.   Swelling and tenderness.   Buckling of the knee.   Inability to straighten or extend your knee.   Your knee locks and you cannot straighten it.   Warmth and redness with pain and fever.   Deformity or dislocation of the kneecap.  DIAGNOSIS  Determining what is wrong may be very straight forward such as when there is an injury. It can also be challenging because of the complexity of the knee. Tests to make a diagnosis may include:  Your caregiver taking a history and doing a physical exam.   Routine X-rays can be used to rule out other problems. X-rays will not reveal a cartilage tear. Some injuries of the knee can be diagnosed by:   Arthroscopy a surgical technique by which a small video camera is inserted through tiny incisions on the sides of the knee. This procedure is used to examine and repair internal knee joint problems. Tiny instruments can be used during arthroscopy to repair the torn knee cartilage  (meniscus).   Arthrography is a radiology technique. A contrast liquid is directly injected into the knee joint. Internal structures of the knee joint then become visible on X-ray film.   An MRI scan is a non x-ray radiology procedure in which magnetic fields and a computer produce two- or three-dimensional images of the inside of the knee. Cartilage tears are often visible using an MRI scanner. MRI scans have largely replaced arthrography in diagnosing cartilage tears of the knee.   Blood work.   Examination of the fluid that helps to lubricate the knee joint (synovial fluid). This is done by taking a sample out using a needle and a syringe.  TREATMENT The treatment of knee problems depends on the cause. Some of these treatments are:  Depending on the injury, proper casting, splinting, surgery or physical therapy care will be needed.   Give yourself adequate recovery time. Do not overuse your joints. If you begin to get sore during workout routines, back off. Slow down or do fewer repetitions.   For repetitive activities such as cycling or running, maintain your strength and nutrition.   Alternate muscle groups. For example if you are a weight lifter, work the upper body on one day and the lower body the next.   Either tight or weak muscles do not give the proper support for your knee. Tight or weak muscles do not absorb the stress placed   on the knee joint. Keep the muscles surrounding the knee strong.   Take care of mechanical problems.   If you have flat feet, orthotics or special shoes may help. See your caregiver if you need help.   Arch supports, sometimes with wedges on the inner or outer aspect of the heel, can help. These can shift pressure away from the side of the knee most bothered by osteoarthritis.   A brace called an "unloader" brace also may be used to help ease the pressure on the most arthritic side of the knee.   If your caregiver has prescribed crutches, braces,  wraps or ice, use as directed. The acronym for this is PRICE. This means protection, rest, ice, compression and elevation.   Nonsteroidal anti-inflammatory drugs (NSAID's), can help relieve pain. But if taken immediately after an injury, they may actually increase swelling. Take NSAID's with food in your stomach. Stop them if you develop stomach problems. Do not take these if you have a history of ulcers, stomach pain or bleeding from the bowel. Do not take without your caregiver's approval if you have problems with fluid retention, heart failure, or kidney problems.   For ongoing knee problems, physical therapy may be helpful.   Glucosamine and chondroitin are over-the-counter dietary supplements. Both may help relieve the pain of osteoarthritis in the knee. These medicines are different from the usual anti-inflammatory drugs. Glucosamine may decrease the rate of cartilage destruction.   Injections of a corticosteroid drug into your knee joint may help reduce the symptoms of an arthritis flare-up. They may provide pain relief that lasts a few months. You may have to wait a few months between injections. The injections do have a small increased risk of infection, water retention and elevated blood sugar levels.   Hyaluronic acid injected into damaged joints may ease pain and provide lubrication. These injections may work by reducing inflammation. A series of shots may give relief for as long as 6 months.   Topical painkillers. Applying certain ointments to your skin may help relieve the pain and stiffness of osteoarthritis. Ask your pharmacist for suggestions. Many over the-counter products are approved for temporary relief of arthritis pain.   In some countries, doctors often prescribe topical NSAID's for relief of chronic conditions such as arthritis and tendinitis. A review of treatment with NSAID creams found that they worked as well as oral medications but without the serious side effects.    PREVENTION  Maintain a healthy weight. Extra pounds put more strain on your joints.   Get strong, stay limber. Weak muscles are a common cause of knee injuries. Stretching is important. Include flexibility exercises in your workouts.   Be smart about exercise. If you have osteoarthritis, chronic knee pain or recurring injuries, you may need to change the way you exercise. This does not mean you have to stop being active. If your knees ache after jogging or playing basketball, consider switching to swimming, water aerobics or other low-impact activities, at least for a few days a week. Sometimes limiting high-impact activities will provide relief.   Make sure your shoes fit well. Choose footwear that is right for your sport.   Protect your knees. Use the proper gear for knee-sensitive activities. Use kneepads when playing volleyball or laying carpet. Buckle your seat belt every time you drive. Most shattered kneecaps occur in car accidents.   Rest when you are tired.  SEEK MEDICAL CARE IF:  You have knee pain that is continual and does not   seem to be getting better.  SEEK IMMEDIATE MEDICAL CARE IF:  Your knee joint feels hot to the touch and you have a high fever. MAKE SURE YOU:   Understand these instructions.   Will watch your condition.   Will get help right away if you are not doing well or get worse.  Document Released: 01/08/2007 Document Revised: 03/02/2011 Document Reviewed: 01/08/2007 [employer] Patient Information 2012 [employer]. 

## 2011-09-21 ENCOUNTER — Emergency Department (HOSPITAL_COMMUNITY)
Admission: EM | Admit: 2011-09-21 | Discharge: 2011-09-22 | Disposition: A | Discharge status: Home / Self Care | Payer: Medicare Other | Attending: Emergency Medicine | Admitting: Emergency Medicine

## 2011-09-21 ENCOUNTER — Encounter (HOSPITAL_COMMUNITY): Payer: Self-pay

## 2011-09-21 DIAGNOSIS — N189 Chronic kidney disease, unspecified: Secondary | ICD-10-CM | POA: Insufficient documentation

## 2011-09-21 DIAGNOSIS — F17200 Nicotine dependence, unspecified, uncomplicated: Secondary | ICD-10-CM | POA: Insufficient documentation

## 2011-09-21 DIAGNOSIS — I129 Hypertensive chronic kidney disease with stage 1 through stage 4 chronic kidney disease, or unspecified chronic kidney disease: Secondary | ICD-10-CM | POA: Insufficient documentation

## 2011-09-21 DIAGNOSIS — Z85528 Personal history of other malignant neoplasm of kidney: Secondary | ICD-10-CM | POA: Insufficient documentation

## 2011-09-21 DIAGNOSIS — F3289 Other specified depressive episodes: Secondary | ICD-10-CM | POA: Insufficient documentation

## 2011-09-21 DIAGNOSIS — X500XXA Overexertion from strenuous movement or load, initial encounter: Secondary | ICD-10-CM | POA: Insufficient documentation

## 2011-09-21 DIAGNOSIS — M25562 Pain in left knee: Secondary | ICD-10-CM

## 2011-09-21 DIAGNOSIS — F411 Generalized anxiety disorder: Secondary | ICD-10-CM | POA: Insufficient documentation

## 2011-09-21 DIAGNOSIS — Y9229 Other specified public building as the place of occurrence of the external cause: Secondary | ICD-10-CM | POA: Insufficient documentation

## 2011-09-21 DIAGNOSIS — M25569 Pain in unspecified knee: Secondary | ICD-10-CM | POA: Insufficient documentation

## 2011-09-21 DIAGNOSIS — F329 Major depressive disorder, single episode, unspecified: Secondary | ICD-10-CM | POA: Insufficient documentation

## 2011-09-21 MED ORDER — HYDROMORPHONE HCL PF 1 MG/ML IJ SOLN
1.0000 mg | Freq: Once | INTRAMUSCULAR | Status: AC
  Administered 2011-09-21: 1 mg via INTRAMUSCULAR
  Filled 2011-09-21: qty 1

## 2011-09-21 MED ORDER — KETOROLAC TROMETHAMINE 60 MG/2ML IM SOLN
60.0000 mg | Freq: Once | INTRAMUSCULAR | Status: AC
  Administered 2011-09-21: 60 mg via INTRAMUSCULAR
  Filled 2011-09-21: qty 2

## 2011-09-21 NOTE — ED Notes (Signed)
Pt c/o left knee pain recently, states out of pain meds, had recent surgery on left knee this past April per pt, pt states "just give me a shot and send me home, I'm tired... I've been up for the past two nights"

## 2011-09-21 NOTE — ED Notes (Signed)
Had a total knee replacement in left knee, feel like I am being stabbed per pt. I bumped my knee during therapy yesterday per pt. Had my surgery the last week of April per pt.

## 2011-09-22 MED ORDER — HYDROCODONE-ACETAMINOPHEN 5-325 MG PO TABS: 1.0000 | ORAL_TABLET | ORAL | Status: AC | PRN

## 2011-09-22 NOTE — Discharge Instructions (Signed)
Knee Pain The knee is the complex joint between your thigh and your lower leg. It is made up of bones, tendons, ligaments, and cartilage. The bones that make up the knee are:  The femur in the thigh.   The tibia and fibula in the lower leg.   The patella or kneecap riding in the groove on the lower femur.  CAUSES  Knee pain is a common complaint with many causes. A few of these causes are:  Injury, such as:   A ruptured ligament or tendon injury.   Torn cartilage.   Medical conditions, such as:   Gout   Arthritis   Infections   Overuse, over training or overdoing a physical activity.  Knee pain can be minor or severe. Knee pain can accompany debilitating injury. Minor knee problems often respond well to self-care measures or get well on their own. More serious injuries may need medical intervention or even surgery. SYMPTOMS The knee is complex. Symptoms of knee problems can vary widely. Some of the problems are:  Pain with movement and weight bearing.   Swelling and tenderness.   Buckling of the knee.   Inability to straighten or extend your knee.   Your knee locks and you cannot straighten it.   Warmth and redness with pain and fever.   Deformity or dislocation of the kneecap.  DIAGNOSIS  Determining what is wrong may be very straight forward such as when there is an injury. It can also be challenging because of the complexity of the knee. Tests to make a diagnosis may include:  Your caregiver taking a history and doing a physical exam.   Routine X-rays can be used to rule out other problems. X-rays will not reveal a cartilage tear. Some injuries of the knee can be diagnosed by:   Arthroscopy a surgical technique by which a small video camera is inserted through tiny incisions on the sides of the knee. This procedure is used to examine and repair internal knee joint problems. Tiny instruments can be used during arthroscopy to repair the torn knee cartilage  (meniscus).   Arthrography is a radiology technique. A contrast liquid is directly injected into the knee joint. Internal structures of the knee joint then become visible on X-ray film.   An MRI scan is a non x-ray radiology procedure in which magnetic fields and a computer produce two- or three-dimensional images of the inside of the knee. Cartilage tears are often visible using an MRI scanner. MRI scans have largely replaced arthrography in diagnosing cartilage tears of the knee.   Blood work.   Examination of the fluid that helps to lubricate the knee joint (synovial fluid). This is done by taking a sample out using a needle and a syringe.  TREATMENT The treatment of knee problems depends on the cause. Some of these treatments are:  Depending on the injury, proper casting, splinting, surgery or physical therapy care will be needed.   Give yourself adequate recovery time. Do not overuse your joints. If you begin to get sore during workout routines, back off. Slow down or do fewer repetitions.   For repetitive activities such as cycling or running, maintain your strength and nutrition.   Alternate muscle groups. For example if you are a weight lifter, work the upper body on one day and the lower body the next.   Either tight or weak muscles do not give the proper support for your knee. Tight or weak muscles do not absorb the stress placed   on the knee joint. Keep the muscles surrounding the knee strong.   Take care of mechanical problems.   If you have flat feet, orthotics or special shoes may help. See your caregiver if you need help.   Arch supports, sometimes with wedges on the inner or outer aspect of the heel, can help. These can shift pressure away from the side of the knee most bothered by osteoarthritis.   A brace called an "unloader" brace also may be used to help ease the pressure on the most arthritic side of the knee.   If your caregiver has prescribed crutches, braces,  wraps or ice, use as directed. The acronym for this is PRICE. This means protection, rest, ice, compression and elevation.   Nonsteroidal anti-inflammatory drugs (NSAID's), can help relieve pain. But if taken immediately after an injury, they may actually increase swelling. Take NSAID's with food in your stomach. Stop them if you develop stomach problems. Do not take these if you have a history of ulcers, stomach pain or bleeding from the bowel. Do not take without your caregiver's approval if you have problems with fluid retention, heart failure, or kidney problems.   For ongoing knee problems, physical therapy may be helpful.   Glucosamine and chondroitin are over-the-counter dietary supplements. Both may help relieve the pain of osteoarthritis in the knee. These medicines are different from the usual anti-inflammatory drugs. Glucosamine may decrease the rate of cartilage destruction.   Injections of a corticosteroid drug into your knee joint may help reduce the symptoms of an arthritis flare-up. They may provide pain relief that lasts a few months. You may have to wait a few months between injections. The injections do have a small increased risk of infection, water retention and elevated blood sugar levels.   Hyaluronic acid injected into damaged joints may ease pain and provide lubrication. These injections may work by reducing inflammation. A series of shots may give relief for as long as 6 months.   Topical painkillers. Applying certain ointments to your skin may help relieve the pain and stiffness of osteoarthritis. Ask your pharmacist for suggestions. Many over the-counter products are approved for temporary relief of arthritis pain.   In some countries, doctors often prescribe topical NSAID's for relief of chronic conditions such as arthritis and tendinitis. A review of treatment with NSAID creams found that they worked as well as oral medications but without the serious side effects.    PREVENTION  Maintain a healthy weight. Extra pounds put more strain on your joints.   Get strong, stay limber. Weak muscles are a common cause of knee injuries. Stretching is important. Include flexibility exercises in your workouts.   Be smart about exercise. If you have osteoarthritis, chronic knee pain or recurring injuries, you may need to change the way you exercise. This does not mean you have to stop being active. If your knees ache after jogging or playing basketball, consider switching to swimming, water aerobics or other low-impact activities, at least for a few days a week. Sometimes limiting high-impact activities will provide relief.   Make sure your shoes fit well. Choose footwear that is right for your sport.   Protect your knees. Use the proper gear for knee-sensitive activities. Use kneepads when playing volleyball or laying carpet. Buckle your seat belt every time you drive. Most shattered kneecaps occur in car accidents.   Rest when you are tired.  SEEK MEDICAL CARE IF:  You have knee pain that is continual and does not   seem to be getting better.  SEEK IMMEDIATE MEDICAL CARE IF:  Your knee joint feels hot to the touch and you have a high fever. MAKE SURE YOU:   Understand these instructions.   Will watch your condition.   Will get help right away if you are not doing well or get worse.  Document Released: 01/08/2007 Document Revised: 03/02/2011 Document Reviewed: 01/08/2007 [employer] Patient Information 2012 [employer]. 

## 2011-09-22 NOTE — ED Provider Notes (Signed)
Medical screening examination/treatment/procedure(s) were performed by non-physician practitioner and as supervising physician I was immediately available for consultation/collaboration.   Joya Gaskins, MD 09/22/11 [phone]

## 2011-09-22 NOTE — ED Provider Notes (Signed)
History     CSN: [id number]  Arrival date & time 09/21/11  2143   First MD Initiated Contact with Patient 09/21/11 2259      Chief Complaint  Patient presents with  . Knee Pain    (Consider location/radiation/quality/duration/timing/severity/associated sxs/prior treatment) HPI Comments: Rebecca Richards presents for evaluation of acute on chronic left knee pain.  Which has been present for approximately 6 weeks when she had her left knee replacement surgery.  She is currently undergoing physical therapy and states she strained her knee yesterday at her physical therapy session.  Since then she has had increasing tenderness over her lateral knee joint space.  She denies any increased swelling but does have baseline swelling which she states is improved since her initial surgery.  Her last physical therapy session is tomorrow and then she sees Dr. Madelon Lips next week for her final postoperative evaluation.  She has taken hydrocodone since her surgery which has been helpful but is currently out.  She was prescribed a "pain patch" but cannot find this prescription to get it filled.  She denies fevers or chills, no numbness or tingling distal to the injured site.  Patient is a 66 y.o. female presenting with knee pain. The history is provided by the patient.  Knee Pain Associated symptoms include arthralgias and joint swelling. Pertinent negatives include no numbness or weakness.    Past Medical History  Diagnosis Date  . Depression   . Brain tumor     Surgery for benign brain tumor in the remote past  . Hypertension   . Renal disorder   . Preop cardiovascular exam     Cardiac clearance for knee surgery, January, 2013  . Edema   . Tobacco abuse   . Ejection fraction     EF 70%, echo, February, 2013  . Sleep apnea     uses Cpap  . Hepatitis 1970's    hep c  . 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 Anxiety   . Arthritis   . 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 Nasal congestion     Past Surgical  History  Procedure Date  . Abdominal hysterectomy   . Knee surgery   . Nose surgery   . Back surgery     pt has stimulator in lower back and can't have an MRI  . 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 Cancer Mother   . Hypertension Mother   . Anesthesia problems Neg Hx     History  Substance Use Topics  . Smoking status: Current Everyday Smoker -- 0.5 packs/day for 10 years    Types: Cigarettes  . Smokeless tobacco: Never Used   Comment: has cut down to 2 cigarettes daily  . Alcohol Use: No     none since 03-28-2011    OB History    Grav Para Term Preterm Abortions TAB SAB Ect Mult Living   1 1 1       1       Review of Systems  Musculoskeletal: Positive for joint swelling and arthralgias.  Skin: Negative for wound.  Neurological: Negative for weakness and numbness.    Allergies  Review of patient's allergies indicates no known allergies.  Home Medications   Current Outpatient Rx  Name Route Sig Dispense Refill  . ALPRAZOLAM 1 MG PO TABS Oral Take 1 mg by mouth 3 (three) times daily as needed. For anxiety or sleep    . AMLODIPINE BESYLATE 10 MG PO TABS Oral Take 10 mg by mouth daily.    . BUPROPION HCL ER (SR) 150 MG PO TB12 Oral Take 150 mg by mouth 2 (two) times daily.     Marland Kitchen LISINOPRIL 10 MG PO TABS Oral Take 10 mg by mouth daily.    Marland Kitchen PROMETHAZINE HCL 25 MG PO TABS Oral Take 12.5 mg by mouth every 6 (six) hours as needed. For nausea    . SALINE 0.65 % NA SOLN Nasal Place 1 spray into the nose as needed. For dry nose    . HYDROCODONE-ACETAMINOPHEN 5-325 MG PO TABS Oral Take 1 tablet by mouth every 4 (four) hours as needed for pain. 20 tablet 0    BP 145/89  Pulse 112  Temp 98.6 F (37 C) (Oral)  Resp 18  Ht 5\' 1"  (1.549 m)  Wt 163 lb  (73.936 kg)  BMI 30.80 kg/m2  SpO2 99%  Physical Exam  Constitutional: She appears well-developed and well-nourished.  HENT:  Head: Atraumatic.  Neck: Normal range of motion.  Cardiovascular:       Pulses equal bilaterally  Musculoskeletal: She exhibits tenderness.       Left knee: She exhibits swelling. She exhibits no deformity, no erythema, no LCL laxity, normal patellar mobility and no MCL laxity. tenderness found. Lateral joint line tenderness noted.       Moderate left knee swelling without effusion which is consistent with postoperative changes.  Well-healed midline incision site without erythema, no fluctuance or induration.  Neurological: She is alert. She has normal strength. She displays normal reflexes. No sensory deficit.       Equal strength  Skin: Skin is warm and dry.  Psychiatric: She has a normal mood and affect.    ED Course  Procedures (including critical care time)  Labs Reviewed - No data to display No results found.   1. Pain, joint, knee, left       MDM  Patient is essentially here for pain control, stating she would like a pain shot so that she can go home and sleep.  She was given Dilaudid 1 mg IM along with Toradol 60 mg IM.  Prescription for hydrocodone.  Patient to followup with her surgeon next week as planned.  The patient appears reasonably screened and/or stabilized for discharge and I doubt any other medical condition or other [facility] requiring further screening, evaluation, or treatment in the ED at this time prior to discharge.         Rebecca Richards, [place] 09/22/11 [phone]

## 2011-09-29 NOTE — ED Notes (Cosign Needed)
Call from [facility] pharmacy today inquiring about prescription for norco written by me on 09/21/11.  Pt presents this prescription today for filling,  Yet she had a prescription filled for dilaudid which was written by Dr. Madelon Lips on 09/22/11.  Advised pharmacist to not fill this prescription.  Burgess Amor, PA 09/29/11 1009

## 2012-03-05 ENCOUNTER — Ambulatory Visit (HOSPITAL_COMMUNITY): Payer: Medicare Other | Admitting: Psychology

## 2012-03-14 ENCOUNTER — Ambulatory Visit (HOSPITAL_COMMUNITY): Payer: Medicare Other | Admitting: Psychology

## 2012-04-05 ENCOUNTER — Emergency Department (HOSPITAL_COMMUNITY)
Admission: EM | Admit: 2012-04-05 | Discharge: 2012-04-05 | Disposition: A | Discharge status: Home / Self Care | Payer: Medicare Other | Attending: Emergency Medicine | Admitting: Emergency Medicine

## 2012-04-05 ENCOUNTER — Encounter (HOSPITAL_COMMUNITY): Payer: Self-pay

## 2012-04-05 DIAGNOSIS — Z85841 Personal history of malignant neoplasm of brain: Secondary | ICD-10-CM | POA: Insufficient documentation

## 2012-04-05 DIAGNOSIS — G8929 Other chronic pain: Secondary | ICD-10-CM | POA: Insufficient documentation

## 2012-04-05 DIAGNOSIS — F329 Major depressive disorder, single episode, unspecified: Secondary | ICD-10-CM | POA: Insufficient documentation

## 2012-04-05 DIAGNOSIS — J209 Acute bronchitis, unspecified: Secondary | ICD-10-CM | POA: Insufficient documentation

## 2012-04-05 DIAGNOSIS — I10 Essential (primary) hypertension: Secondary | ICD-10-CM | POA: Insufficient documentation

## 2012-04-05 DIAGNOSIS — R1110 Vomiting, unspecified: Secondary | ICD-10-CM | POA: Insufficient documentation

## 2012-04-05 DIAGNOSIS — M549 Dorsalgia, unspecified: Secondary | ICD-10-CM | POA: Insufficient documentation

## 2012-04-05 DIAGNOSIS — Z8719 Personal history of other diseases of the digestive system: Secondary | ICD-10-CM | POA: Insufficient documentation

## 2012-04-05 DIAGNOSIS — F17200 Nicotine dependence, unspecified, uncomplicated: Secondary | ICD-10-CM | POA: Insufficient documentation

## 2012-04-05 DIAGNOSIS — Z79899 Other long term (current) drug therapy: Secondary | ICD-10-CM | POA: Insufficient documentation

## 2012-04-05 DIAGNOSIS — F3289 Other specified depressive episodes: Secondary | ICD-10-CM | POA: Insufficient documentation

## 2012-04-05 DIAGNOSIS — Z8709 Personal history of other diseases of the respiratory system: Secondary | ICD-10-CM | POA: Insufficient documentation

## 2012-04-05 DIAGNOSIS — Z8739 Personal history of other diseases of the musculoskeletal system and connective tissue: Secondary | ICD-10-CM | POA: Insufficient documentation

## 2012-04-05 DIAGNOSIS — Z87448 Personal history of other diseases of urinary system: Secondary | ICD-10-CM | POA: Insufficient documentation

## 2012-04-05 MED ORDER — HYDROCOD POLST-CHLORPHEN POLST 10-8 MG/5ML PO LQCR: 10.0000 mL | Freq: Two times a day (BID) | ORAL | Status: DC | PRN

## 2012-04-05 MED ORDER — OXYCODONE-ACETAMINOPHEN 5-325 MG PO TABS
1.0000 | ORAL_TABLET | Freq: Once | ORAL | Status: AC
  Administered 2012-04-05: 1 via ORAL
  Filled 2012-04-05: qty 1

## 2012-04-05 MED ORDER — AZITHROMYCIN 250 MG PO TABS: ORAL_TABLET | ORAL | Status: DC

## 2012-04-05 MED ORDER — AZITHROMYCIN 250 MG PO TABS
500.0000 mg | ORAL_TABLET | Freq: Once | ORAL | Status: AC
  Administered 2012-04-05: 500 mg via ORAL
  Filled 2012-04-05: qty 2

## 2012-04-05 MED ORDER — HYDROCOD POLST-CHLORPHEN POLST 10-8 MG/5ML PO LQCR
10.0000 mL | Freq: Once | ORAL | Status: AC
  Administered 2012-04-05: 10 mL via ORAL
  Filled 2012-04-05: qty 10

## 2012-04-05 MED ORDER — OXYCODONE-ACETAMINOPHEN 5-325 MG PO TABS: 1.0000 | ORAL_TABLET | ORAL | Status: AC | PRN

## 2012-04-05 NOTE — ED Provider Notes (Signed)
History  This chart was scribed for Carleene Cooper III, MD by Ardeen Jourdain, ED Scribe. This patient was seen in room APA06/APA06 and the patient's care was started at 1901.  CSN: [id number]  Arrival date & time 04/05/12  1536   First MD Initiated Contact with Patient 04/05/12 1901      Chief Complaint  Patient presents with  . Cough  . Neck Pain     The history is provided by the patient. No language interpreter was used.     Rebecca Richards is a 67 y.o. female who presents to the Emergency Department complaining of cough with associated neck pain and emesis. She reports the cough has been keeping her awake at night and has caused her neck to be sore. She describes the pain as a shooting sensation. She states she has had 1 episode of post tussive emesis 1 day ago. She reports she stopped smoking 1 week ago. She denies any fever, chills and chest tenderness as associated symptoms.   Past Medical History  Diagnosis Date  . Depression   . Brain tumor     Surgery for benign brain tumor in the remote past  . Hypertension   . Renal disorder   . Preop cardiovascular exam     Cardiac clearance for knee surgery, January, 2013  . Edema   . Tobacco abuse   . Ejection fraction     EF 70%, echo, February, 2013  . Sleep apnea     uses Cpap  . Hepatitis 1970's    hep c  . 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 Anxiety   . Arthritis   . 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 Nasal congestion     Past Surgical History  Procedure Date  . Abdominal hysterectomy   . Knee surgery   . Nose surgery   . Back surgery     pt has stimulator in lower back and can't have an MRI  . 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 Cancer Mother   . Hypertension Mother   . Anesthesia problems Neg Hx      History  Substance Use Topics  . Smoking status: Current Every Day Smoker -- 0.5 packs/day for 10 years    Types: Cigarettes  . Smokeless tobacco: Never Used     Comment: has cut down to 2 cigarettes daily  . Alcohol Use: No     Comment: none since 03-28-2011    OB History    Grav Para Term Preterm Abortions TAB SAB Ect Mult Living   1 1 1       1       Review of Systems  Constitutional: Negative for fever and chills.  HENT: Positive for neck pain and neck stiffness. Negative for sore throat.   Respiratory: Positive for cough.   Gastrointestinal: Positive for vomiting. Negative for nausea.  All other systems reviewed and are negative.    Allergies  Review of patient's allergies indicates no known allergies.  Home Medications   Current Outpatient Rx  Name  Route  Sig  Dispense  Refill  . ALPRAZOLAM 1 MG PO TABS   Oral   Take 1 mg by mouth 3 (  three) times daily as needed. For anxiety or sleep         . AMLODIPINE BESYLATE 10 MG PO TABS   Oral   Take 10 mg by mouth daily.         . BUPROPION HCL ER (SR) 150 MG PO TB12   Oral   Take 150 mg by mouth 2 (two) times daily.          Marland Kitchen LISINOPRIL 10 MG PO TABS   Oral   Take 10 mg by mouth daily.         Marland Kitchen PROMETHAZINE HCL 25 MG PO TABS   Oral   Take 12.5 mg by mouth every 6 (six) hours as needed. For nausea         . SALINE 0.65 % NA SOLN   Nasal   Place 1 spray into the nose as needed. For dry nose           Triage Vitals: BP 124/71  Pulse 104  Temp 98.2 F (36.8 C) (Oral)  Resp 16  SpO2 98%  Physical Exam  Nursing note and vitals reviewed. Constitutional: She is oriented to person, place, and time. She appears well-developed and well-nourished. No distress.  HENT:  Head: Normocephalic and atraumatic.  Right Ear: External ear normal.  Left Ear: External ear normal.  Mouth/Throat: Oropharynx is clear and moist. No oropharyngeal exudate.  Eyes: Conjunctivae normal and EOM are normal.  Pupils are equal, round, and reactive to light. Right eye exhibits no discharge. Left eye exhibits no discharge.  Neck: Normal range of motion. Neck supple. No tracheal deviation present.  Cardiovascular: Normal rate, regular rhythm and normal heart sounds.  Exam reveals no gallop and no friction rub.   No murmur heard. Pulmonary/Chest: Effort normal and breath sounds normal. No respiratory distress. She has no wheezes. She has no rales.       Splinting of left chest when coughing   Abdominal: Soft. Bowel sounds are normal. She exhibits no distension. There is no tenderness. There is no rebound and no guarding.  Musculoskeletal: Normal range of motion. She exhibits no edema and no tenderness.  Lymphadenopathy:    She has no cervical adenopathy.  Neurological: She is alert and oriented to person, place, and time.  Skin: Skin is warm and dry.  Psychiatric: She has a normal mood and affect. Her behavior is normal.    ED Course  Procedures (including critical care time)  DIAGNOSTIC STUDIES: Oxygen Saturation is 98% on room air, normal by my interpretation.    COORDINATION OF CARE:  7:03 PM: Discussed treatment plan which includes antibiotics and pain medication with pt at bedside and pt agreed to plan.     Rx Z-Pak, Tussionex for bronchitis, Percocet for chronic pain.    1. Acute bronchitis   2. Chronic pain    I personally performed the services described in this documentation, which was scribed in my presence. The recorded information has been reviewed and is accurate. Osvaldo Human, M.D.     Carleene Cooper III, MD 04/07/12 2021

## 2012-04-05 NOTE — ED Notes (Signed)
MD at bedside. 

## 2012-04-05 NOTE — ED Notes (Signed)
Pt reports severe cough that has been keeping her awake.  Pt also reports " I've coughed so much my neck has pain shooting in it".  Pt denies any fever.

## 2012-04-17 ENCOUNTER — Other Ambulatory Visit (HOSPITAL_COMMUNITY): Payer: Self-pay | Admitting: Orthopaedic Surgery

## 2012-04-17 DIAGNOSIS — S92902A Unspecified fracture of left foot, initial encounter for closed fracture: Secondary | ICD-10-CM

## 2012-04-18 ENCOUNTER — Encounter (HOSPITAL_COMMUNITY)
Admission: RE | Admit: 2012-04-18 | Discharge: 2012-04-18 | Disposition: A | Discharge status: Home / Self Care | Payer: Medicare Other | Source: Ambulatory Visit | Attending: Orthopaedic Surgery | Admitting: Orthopaedic Surgery

## 2012-04-18 ENCOUNTER — Encounter (HOSPITAL_COMMUNITY): Payer: Self-pay

## 2012-04-18 DIAGNOSIS — R937 Abnormal findings on diagnostic imaging of other parts of musculoskeletal system: Secondary | ICD-10-CM | POA: Insufficient documentation

## 2012-04-18 DIAGNOSIS — M25579 Pain in unspecified ankle and joints of unspecified foot: Secondary | ICD-10-CM | POA: Insufficient documentation

## 2012-04-18 DIAGNOSIS — S92902A Unspecified fracture of left foot, initial encounter for closed fracture: Secondary | ICD-10-CM

## 2012-04-18 MED ORDER — TECHNETIUM TC 99M MEDRONATE IV KIT
17.0000 | PACK | Freq: Once | INTRAVENOUS | Status: AC | PRN
  Administered 2012-04-18: 17 via INTRAVENOUS

## 2012-05-21 ENCOUNTER — Emergency Department (HOSPITAL_COMMUNITY)
Admission: EM | Admit: 2012-05-21 | Discharge: 2012-05-21 | Disposition: A | Discharge status: Home / Self Care | Payer: Medicare Other | Attending: Emergency Medicine | Admitting: Emergency Medicine

## 2012-05-21 ENCOUNTER — Encounter (HOSPITAL_COMMUNITY): Payer: Self-pay | Admitting: Emergency Medicine

## 2012-05-21 DIAGNOSIS — Z8659 Personal history of other mental and behavioral disorders: Secondary | ICD-10-CM | POA: Insufficient documentation

## 2012-05-21 DIAGNOSIS — F411 Generalized anxiety disorder: Secondary | ICD-10-CM | POA: Insufficient documentation

## 2012-05-21 DIAGNOSIS — R5381 Other malaise: Secondary | ICD-10-CM | POA: Insufficient documentation

## 2012-05-21 DIAGNOSIS — R630 Anorexia: Secondary | ICD-10-CM | POA: Insufficient documentation

## 2012-05-21 DIAGNOSIS — G4730 Sleep apnea, unspecified: Secondary | ICD-10-CM | POA: Insufficient documentation

## 2012-05-21 DIAGNOSIS — Z79899 Other long term (current) drug therapy: Secondary | ICD-10-CM | POA: Insufficient documentation

## 2012-05-21 DIAGNOSIS — E119 Type 2 diabetes mellitus without complications: Secondary | ICD-10-CM | POA: Insufficient documentation

## 2012-05-21 DIAGNOSIS — Z8719 Personal history of other diseases of the digestive system: Secondary | ICD-10-CM | POA: Insufficient documentation

## 2012-05-21 DIAGNOSIS — Z8669 Personal history of other diseases of the nervous system and sense organs: Secondary | ICD-10-CM | POA: Insufficient documentation

## 2012-05-21 DIAGNOSIS — I10 Essential (primary) hypertension: Secondary | ICD-10-CM | POA: Insufficient documentation

## 2012-05-21 DIAGNOSIS — R197 Diarrhea, unspecified: Secondary | ICD-10-CM | POA: Insufficient documentation

## 2012-05-21 DIAGNOSIS — Z8619 Personal history of other infectious and parasitic diseases: Secondary | ICD-10-CM | POA: Insufficient documentation

## 2012-05-21 DIAGNOSIS — Z87448 Personal history of other diseases of urinary system: Secondary | ICD-10-CM | POA: Insufficient documentation

## 2012-05-21 DIAGNOSIS — J029 Acute pharyngitis, unspecified: Secondary | ICD-10-CM | POA: Insufficient documentation

## 2012-05-21 DIAGNOSIS — R112 Nausea with vomiting, unspecified: Secondary | ICD-10-CM | POA: Insufficient documentation

## 2012-05-21 DIAGNOSIS — Z87891 Personal history of nicotine dependence: Secondary | ICD-10-CM | POA: Insufficient documentation

## 2012-05-21 DIAGNOSIS — M129 Arthropathy, unspecified: Secondary | ICD-10-CM | POA: Insufficient documentation

## 2012-05-21 DIAGNOSIS — Z872 Personal history of diseases of the skin and subcutaneous tissue: Secondary | ICD-10-CM | POA: Insufficient documentation

## 2012-05-21 LAB — CBC
HCT: 39.1 % (ref 36.0–46.0)
Hemoglobin: 13.1 g/dL (ref 12.0–15.0)
MCH: 27.4 pg (ref 26.0–34.0)
MCHC: 33.5 g/dL (ref 30.0–36.0)
MCV: 81.8 fL (ref 78.0–100.0)
Platelets: 320 10*3/uL (ref 150–400)
RBC: 4.78 MIL/uL (ref 3.87–5.11)
RDW: 13.8 % (ref 11.5–15.5)
WBC: 9.8 10*3/uL (ref 4.0–10.5)

## 2012-05-21 LAB — URINALYSIS, ROUTINE W REFLEX MICROSCOPIC
Bilirubin Urine: NEGATIVE
Glucose, UA: >1000 mg/dL — AB
Ketones, ur: NEGATIVE mg/dL
Leukocytes, UA: NEGATIVE
Nitrite: NEGATIVE
Protein, ur: 30 mg/dL — AB
Specific Gravity, Urine: <1.005 — ABNORMAL LOW (ref 1.005–1.030)
Urobilinogen, UA: 0.2 mg/dL (ref 0.0–1.0)
pH: 6 (ref 5.0–8.0)

## 2012-05-21 LAB — BASIC METABOLIC PANEL
BUN: 14 mg/dL (ref 6–23)
CO2: 24 mEq/L (ref 19–32)
Calcium: 10.1 mg/dL (ref 8.4–10.5)
Chloride: 92 mEq/L — ABNORMAL LOW (ref 96–112)
Creatinine, Ser: 1.02 mg/dL (ref 0.50–1.10)
GFR calc Af Amer: 66 mL/min — ABNORMAL LOW (ref >90)
GFR calc non Af Amer: 57 mL/min — ABNORMAL LOW (ref >90)
Glucose, Bld: 443 mg/dL — ABNORMAL HIGH (ref 70–99)
Potassium: 3 mEq/L — ABNORMAL LOW (ref 3.5–5.1)
Sodium: 133 mEq/L — ABNORMAL LOW (ref 135–145)

## 2012-05-21 LAB — URINE MICROSCOPIC-ADD ON: Urine-Other: NONE SEEN

## 2012-05-21 MED ORDER — SODIUM CHLORIDE 0.9 % IV BOLUS (SEPSIS)
1000.0000 mL | Freq: Once | INTRAVENOUS | Status: AC
  Administered 2012-05-21: 1000 mL via INTRAVENOUS

## 2012-05-21 MED ORDER — POTASSIUM CHLORIDE CRYS ER 20 MEQ PO TBCR
60.0000 meq | EXTENDED_RELEASE_TABLET | Freq: Once | ORAL | Status: AC
  Administered 2012-05-21: 60 meq via ORAL
  Filled 2012-05-21: qty 3

## 2012-05-21 MED ORDER — ONDANSETRON HCL 4 MG/2ML IJ SOLN
4.0000 mg | Freq: Once | INTRAMUSCULAR | Status: AC
  Administered 2012-05-21: 4 mg via INTRAVENOUS
  Filled 2012-05-21: qty 2

## 2012-05-21 MED ORDER — DIPHENOXYLATE-ATROPINE 2.5-0.025 MG PO TABS
1.0000 | ORAL_TABLET | Freq: Once | ORAL | Status: AC
  Administered 2012-05-21: 1 via ORAL
  Filled 2012-05-21: qty 1

## 2012-05-21 MED ORDER — ONDANSETRON HCL 4 MG PO TABS: 4.0000 mg | ORAL_TABLET | Freq: Four times a day (QID) | ORAL | Status: DC

## 2012-05-21 MED ORDER — METFORMIN HCL 500 MG PO TABS: 500.0000 mg | ORAL_TABLET | Freq: Two times a day (BID) | ORAL | Status: DC

## 2012-05-21 NOTE — ED Notes (Signed)
Patient c/o vomiting and diarrhea since Saturday.

## 2012-05-21 NOTE — ED Provider Notes (Signed)
History  This chart was scribed for Raeford Razor, MD, by Candelaria Stagers, ED Scribe. This patient was seen in room APA05/APA05 and the patient's care was started at 8:26 PM   CSN: [id number]  Arrival date & time 05/21/12  5621   First MD Initiated Contact with Patient 05/21/12 2009      Chief Complaint  Patient presents with  . Emesis  . Diarrhea     The history is provided by the patient. No language interpreter was used.   Rebecca Richards is a 67 y.o. female who presents to the Emergency Department complaining of diarrhea and vomiting that started three days ago.  She reports she feels weak and has loss of appetite.  Pt denies abdominal pain, hematuria, blood in stool, SOB, fever, chills, or difficulty urinating.  She has had no ill contacts and denies recent travel.  Nothing seems to make the sx better or worse.  Pt has h/o hysterectomy.  She has taken geritol which made the sx worse.      Past Medical History  Diagnosis Date  . Depression   . Brain tumor     Surgery for benign brain tumor in the remote past  . Hypertension   . Renal disorder   . Preop cardiovascular exam     Cardiac clearance for knee surgery, January, 2013  . Edema   . Tobacco abuse   . Ejection fraction     EF 70%, echo, February, 2013  . Sleep apnea     uses Cpap  . Hepatitis 1970's    hep c  . 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 Anxiety   . Arthritis   . 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 Nasal congestion     Past Surgical History  Procedure Laterality Date  . Abdominal hysterectomy    . Knee surgery    . Nose surgery    . Back surgery      pt has stimulator in lower back and can't have an MRI  . 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 Total knee arthroplasty  07/21/2011    Procedure: TOTAL KNEE ARTHROPLASTY;  Surgeon: Thera Flake., MD;  Location: [facility] OR;  Service: Orthopedics;  Laterality: Left;    Family History  Problem Relation Age of Onset  . Cancer Mother   . Hypertension Mother   .  Anesthesia problems Neg Hx     History  Substance Use Topics  . Smoking status: Former Smoker -- 0.50 packs/day for 10 years    Types: Cigarettes    Quit date: 02/19/2012  . Smokeless tobacco: Never Used     Comment: has cut down to 2 cigarettes daily  . Alcohol Use: No     Comment: none since 03-28-2011    OB History   Grav Para Term Preterm Abortions TAB SAB Ect Mult Living   1 1 1       1       Review of Systems  Constitutional: Negative for fever and chills.  Gastrointestinal: Positive for nausea, vomiting and diarrhea. Negative for abdominal pain and blood in stool.  Genitourinary: Negative for hematuria.  All other systems reviewed and are negative.    Allergies  Review of patient's allergies indicates no known allergies.  Home Medications   Current Outpatient Rx  Name  Route  Sig  Dispense  Refill  . ALPRAZolam (XANAX) 1 MG tablet   Oral  Take 1 mg by mouth 3 (three) times daily as needed. For anxiety or sleep         . amLODipine (NORVASC) 10 MG tablet   Oral   Take 10 mg by mouth daily.         Marland Kitchen azithromycin (ZITHROMAX Z-PAK) 250 MG tablet      Take one tablet once a day for the next 4 days.   4 tablet   0   . buPROPion (WELLBUTRIN SR) 150 MG 12 hr tablet   Oral   Take 150 mg by mouth 2 (two) times daily.          . chlorpheniramine-HYDROcodone (TUSSIONEX PENNKINETIC ER) 10-8 MG/5ML LQCR   Oral   Take 10 mLs by mouth every 12 (twelve) hours as needed (Take 2 teaspoons every 12 hours if needed for cough.).   60 mL   0   . lisinopril (PRINIVIL,ZESTRIL) 10 MG tablet   Oral   Take 10 mg by mouth daily.         . promethazine (PHENERGAN) 25 MG tablet   Oral   Take 12.5 mg by mouth every 6 (six) hours as needed. For nausea         . sodium chloride (OCEAN) 0.65 % SOLN nasal spray   Nasal   Place 1 spray into the nose as needed. For dry nose           BP 132/84  Pulse 92  Temp(Src) 98.6 F (37 C) (Oral)  Resp 18  Ht 5\' 1"   (1.549 m)  Wt 166 lb (75.297 kg)  BMI 31.38 kg/m2  SpO2 100%  Physical Exam  Nursing note and vitals reviewed. Constitutional: She is oriented to person, place, and time. She appears well-developed and well-nourished. No distress.  HENT:  Head: Normocephalic and atraumatic.  Eyes: EOM are normal. Pupils are equal, round, and reactive to light.  Neck: Neck supple. No tracheal deviation present.  Cardiovascular: Normal rate.   Pulmonary/Chest: Effort normal. No respiratory distress.  Abdominal: Soft. She exhibits no distension. There is no tenderness. There is no rebound and no guarding.  Musculoskeletal: Normal range of motion. She exhibits no edema.  Neurological: She is alert and oriented to person, place, and time. No sensory deficit.  Skin: Skin is warm and dry.  Psychiatric: She has a normal mood and affect. Her behavior is normal.    ED Course  Procedures   DIAGNOSTIC STUDIES: Oxygen Saturation is 100% on room air, normal by my interpretation.    COORDINATION OF CARE:  8:28 PM Will give medication for nausea and diarrhea.  Pt understands and agrees.   9:22 PM Recheck: Pt reports she is feeling better.  Discussed elevated blood sugar with pt and need for prescription of insulin.  Pt reports she has never been diagnosed with diabetes.  Advised pt to follow up with PCP.  Pt understands and agrees.     Labs Reviewed  BASIC METABOLIC PANEL - Abnormal; Notable for the following:    Sodium 133 (*)    Potassium 3.0 (*)    Chloride 92 (*)    Glucose, Bld 443 (*)    GFR calc non Af Amer 57 (*)    GFR calc Af Amer 66 (*)    All other components within normal limits  URINALYSIS, ROUTINE W REFLEX MICROSCOPIC - Abnormal; Notable for the following:    Specific Gravity, Urine <1.005 (*)    Glucose, UA >1000 (*)    Hgb  urine dipstick SMALL (*)    Protein, ur 30 (*)    All other components within normal limits  CBC  URINE MICROSCOPIC-ADD ON   No results found.   1. Nausea  vomiting and diarrhea   2. Diabetes mellitus, new onset        MDM  67 year old female with nausea vomiting and diarrhea. Suspect viral illness. Workup significant for significant hyperglycemia. Patient with no diagnosed history of diabetes. Renal function is ok. Will start patient on metformin. Discussed the importance of following up with her PCP. Emergent return precautions discussed.  I personally preformed the services scribed in my presence. The recorded information has been reviewed is accurate. Raeford Razor, MD.       Raeford Razor, MD 05/23/12 0100

## 2012-06-08 ENCOUNTER — Encounter (HOSPITAL_COMMUNITY): Payer: Self-pay | Admitting: *Deleted

## 2012-06-08 ENCOUNTER — Emergency Department (HOSPITAL_COMMUNITY)
Admission: EM | Admit: 2012-06-08 | Discharge: 2012-06-09 | Disposition: A | Discharge status: Home / Self Care | Payer: Medicare Other | Attending: Emergency Medicine | Admitting: Emergency Medicine

## 2012-06-08 DIAGNOSIS — Z8739 Personal history of other diseases of the musculoskeletal system and connective tissue: Secondary | ICD-10-CM | POA: Insufficient documentation

## 2012-06-08 DIAGNOSIS — R197 Diarrhea, unspecified: Secondary | ICD-10-CM | POA: Insufficient documentation

## 2012-06-08 DIAGNOSIS — Z8669 Personal history of other diseases of the nervous system and sense organs: Secondary | ICD-10-CM | POA: Insufficient documentation

## 2012-06-08 DIAGNOSIS — Z8659 Personal history of other mental and behavioral disorders: Secondary | ICD-10-CM | POA: Insufficient documentation

## 2012-06-08 DIAGNOSIS — I10 Essential (primary) hypertension: Secondary | ICD-10-CM | POA: Insufficient documentation

## 2012-06-08 DIAGNOSIS — Z87448 Personal history of other diseases of urinary system: Secondary | ICD-10-CM | POA: Insufficient documentation

## 2012-06-08 DIAGNOSIS — G4730 Sleep apnea, unspecified: Secondary | ICD-10-CM | POA: Insufficient documentation

## 2012-06-08 DIAGNOSIS — Z8619 Personal history of other infectious and parasitic diseases: Secondary | ICD-10-CM | POA: Insufficient documentation

## 2012-06-08 DIAGNOSIS — R112 Nausea with vomiting, unspecified: Secondary | ICD-10-CM

## 2012-06-08 DIAGNOSIS — Z79899 Other long term (current) drug therapy: Secondary | ICD-10-CM | POA: Insufficient documentation

## 2012-06-08 DIAGNOSIS — Z85528 Personal history of other malignant neoplasm of kidney: Secondary | ICD-10-CM | POA: Insufficient documentation

## 2012-06-08 DIAGNOSIS — R109 Unspecified abdominal pain: Secondary | ICD-10-CM | POA: Insufficient documentation

## 2012-06-08 DIAGNOSIS — Z872 Personal history of diseases of the skin and subcutaneous tissue: Secondary | ICD-10-CM | POA: Insufficient documentation

## 2012-06-08 DIAGNOSIS — Z87891 Personal history of nicotine dependence: Secondary | ICD-10-CM | POA: Insufficient documentation

## 2012-06-08 DIAGNOSIS — E119 Type 2 diabetes mellitus without complications: Secondary | ICD-10-CM | POA: Insufficient documentation

## 2012-06-08 HISTORY — DX: Type 2 diabetes mellitus without complications: E11.9

## 2012-06-08 LAB — CBC WITH DIFFERENTIAL/PLATELET
Basophils Absolute: 0.1 10*3/uL (ref 0.0–0.1)
Basophils Relative: 1 % (ref 0–1)
Eosinophils Absolute: 0.3 10*3/uL (ref 0.0–0.7)
Hemoglobin: 13.3 g/dL (ref 12.0–15.0)
MCH: 27.7 pg (ref 26.0–34.0)
MCHC: 33.6 g/dL (ref 30.0–36.0)
Monocytes Absolute: 0.6 10*3/uL (ref 0.1–1.0)
Monocytes Relative: 6 % (ref 3–12)
Neutrophils Relative %: 74 % (ref 43–77)
RDW: 13.9 % (ref 11.5–15.5)

## 2012-06-08 LAB — URINALYSIS, ROUTINE W REFLEX MICROSCOPIC
Glucose, UA: NEGATIVE mg/dL
Leukocytes, UA: NEGATIVE
pH: 6 (ref 5.0–8.0)

## 2012-06-08 LAB — URINE MICROSCOPIC-ADD ON

## 2012-06-08 MED ORDER — ONDANSETRON HCL 4 MG/2ML IJ SOLN
4.0000 mg | Freq: Once | INTRAMUSCULAR | Status: AC
  Administered 2012-06-08: 4 mg via INTRAVENOUS
  Filled 2012-06-08: qty 2

## 2012-06-08 MED ORDER — ONDANSETRON 4 MG PO TBDP: 4.0000 mg | ORAL_TABLET | Freq: Once | ORAL | Status: DC

## 2012-06-08 MED ORDER — PANTOPRAZOLE SODIUM 40 MG IV SOLR
40.0000 mg | INTRAVENOUS | Status: AC
  Administered 2012-06-08: 40 mg via INTRAVENOUS
  Filled 2012-06-08: qty 40

## 2012-06-08 MED ORDER — MORPHINE SULFATE 2 MG/ML IJ SOLN
2.0000 mg | Freq: Once | INTRAMUSCULAR | Status: AC
  Administered 2012-06-08: 2 mg via INTRAVENOUS
  Filled 2012-06-08: qty 1

## 2012-06-08 MED ORDER — SODIUM CHLORIDE 0.9 % IV BOLUS (SEPSIS)
1000.0000 mL | Freq: Once | INTRAVENOUS | Status: AC
Start: 2012-06-08 — End: 2012-06-09
  Administered 2012-06-08: 1000 mL via INTRAVENOUS

## 2012-06-08 NOTE — ED Notes (Signed)
Pt with abd pain with N/V/D, also states that she needs something to help her sleep

## 2012-06-08 NOTE — ED Provider Notes (Signed)
History     CSN: [id number]  Arrival date & time 06/08/12  2137   First MD Initiated Contact with Patient 06/08/12 2255      Chief Complaint  Patient presents with  . Abdominal Pain  . Emesis  . Diarrhea    (Consider location/radiation/quality/duration/timing/severity/associated sxs/prior treatment) HPI Rebecca Richards is a 67 y.o. female who presents to the Emergency Department complaining of nausea, vomiting and diarrhea that began this morning. She has had multiple episodes of vomiting and is unable to keep down fluids.Denies fever, chills.  PCP Dr. Sherryll Burger Past Medical History  Diagnosis Date  . Depression   . Brain tumor     Surgery for benign brain tumor in the remote past  . Hypertension   . Renal disorder   . Preop cardiovascular exam     Cardiac clearance for knee surgery, January, 2013  . Edema   . Tobacco abuse   . Ejection fraction     EF 70%, echo, February, 2013  . Sleep apnea     uses Cpap  . Hepatitis 1970's    hep c  . 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 Anxiety   . Arthritis   . 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 Nasal congestion   . Diabetes mellitus without complication     Past Surgical History  Procedure Laterality Date  . Abdominal hysterectomy    . Knee surgery    . Nose surgery    . Back surgery      pt has stimulator in lower back and can't have an MRI  . 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 Total knee arthroplasty  07/21/2011    Procedure: TOTAL KNEE ARTHROPLASTY;  Surgeon: Thera Flake., MD;  Location: [facility] OR;  Service: Orthopedics;  Laterality: Left;    Family History  Problem Relation Age of Onset  . Cancer Mother   . Hypertension Mother   . Anesthesia problems Neg Hx     History  Substance Use Topics  . Smoking status: Former Smoker -- 0.50 packs/day for 10 years    Types: Cigarettes    Quit date: 02/19/2012  . Smokeless tobacco: Never Used     Comment: has cut down to 2 cigarettes daily  . Alcohol Use: No     Comment:  none since 03-28-2011    OB History   Grav Para Term Preterm Abortions TAB SAB Ect Mult Living   1 1 1       1       Review of Systems  Constitutional: Negative for fever.       10 Systems reviewed and are negative for acute change except as noted in the HPI.  HENT: Negative for congestion.   Eyes: Negative for discharge and redness.  Respiratory: Negative for cough and shortness of breath.   Cardiovascular: Negative for chest pain.  Gastrointestinal: Positive for nausea, vomiting and diarrhea. Negative for abdominal pain.  Musculoskeletal: Negative for back pain.  Skin: Negative for rash.  Neurological: Negative for syncope, numbness and headaches.  Psychiatric/Behavioral:       No behavior change.    Allergies  Review of patient's allergies indicates no known allergies.  Home Medications   Current Outpatient Rx  Name  Route  Sig  Dispense  Refill  . amLODipine (NORVASC) 10 MG tablet   Oral   Take 10 mg by mouth daily.         Marland Kitchen  lisinopril (PRINIVIL,ZESTRIL) 10 MG tablet   Oral   Take 10 mg by mouth daily.         . metFORMIN (GLUCOPHAGE) 500 MG tablet   Oral   Take 1 tablet (500 mg total) by mouth 2 (two) times daily with a meal.   60 tablet   0   . acetaminophen (TYLENOL) 500 MG tablet   Oral   Take 500 mg by mouth daily as needed for pain.         . Aspirin-Salicylamide-Caffeine (BC HEADACHE) 325-95-16 MG TABS   Oral   Take 1 packet by mouth daily as needed (for pain).           BP 145/83  Pulse 90  Temp(Src) 97.3 F (36.3 C) (Oral)  Resp 18  Ht 5\' 1"  (1.549 m)  Wt 160 lb (72.576 kg)  BMI 30.25 kg/m2  SpO2 98%  Physical Exam  Nursing note and vitals reviewed. Constitutional: She appears well-developed and well-nourished.  Awake, alert, nontoxic appearance.  HENT:  Head: Normocephalic and atraumatic.  Right Ear: External ear normal.  Left Ear: External ear normal.  Mouth/Throat: Oropharynx is clear and moist.  Eyes: EOM are normal.  Pupils are equal, round, and reactive to light. Right eye exhibits no discharge. Left eye exhibits no discharge.  Neck: Normal range of motion. Neck supple.  Cardiovascular: Normal rate.   Pulmonary/Chest: Effort normal and breath sounds normal. She exhibits no tenderness.  Abdominal: Soft. Bowel sounds are normal. There is no tenderness. There is no rebound.  Musculoskeletal: She exhibits no tenderness.  Baseline ROM, no obvious new focal weakness.  Neurological:  Mental status and motor strength appears baseline for patient and situation.  Skin: No rash noted.  Psychiatric: She has a normal mood and affect.    ED Course  Procedures (including critical care time)  Results for orders placed during the hospital encounter of 06/08/12  GLUCOSE, CAPILLARY      Result Value Range   Glucose-Capillary 97  70 - 99 mg/dL  CBC WITH DIFFERENTIAL      Result Value Range   WBC 9.6  4.0 - 10.5 K/uL   RBC 4.81  3.87 - 5.11 MIL/uL   Hemoglobin 13.3  12.0 - 15.0 g/dL   HCT 16.1  09.6 - 04.5 %   MCV 82.3  78.0 - 100.0 fL   MCH 27.7  26.0 - 34.0 pg   MCHC 33.6  30.0 - 36.0 g/dL   RDW 40.9  81.1 - 91.4 %   Platelets 349  150 - 400 K/uL   Neutrophils Relative 74  43 - 77 %   Neutro Abs 7.1  1.7 - 7.7 K/uL   Lymphocytes Relative 17  12 - 46 %   Lymphs Abs 1.7  0.7 - 4.0 K/uL   Monocytes Relative 6  3 - 12 %   Monocytes Absolute 0.6  0.1 - 1.0 K/uL   Eosinophils Relative 3  0 - 5 %   Eosinophils Absolute 0.3  0.0 - 0.7 K/uL   Basophils Relative 1  0 - 1 %   Basophils Absolute 0.1  0.0 - 0.1 K/uL  COMPREHENSIVE METABOLIC PANEL      Result Value Range   Sodium 138  135 - 145 mEq/L   Potassium 3.3 (*) 3.5 - 5.1 mEq/L   Chloride 96  96 - 112 mEq/L   CO2 27  19 - 32 mEq/L   Glucose, Bld 104 (*) 70 - 99 mg/dL  BUN 9  6 - 23 mg/dL   Creatinine, Ser 4.09  0.50 - 1.10 mg/dL   Calcium 81.1  8.4 - 91.4 mg/dL   Total Protein 8.5 (*) 6.0 - 8.3 g/dL   Albumin 4.3  3.5 - 5.2 g/dL   AST 22  0 - 37 U/L    ALT 18  0 - 35 U/L   Alkaline Phosphatase 148 (*) 39 - 117 U/L   Total Bilirubin 0.2 (*) 0.3 - 1.2 mg/dL   GFR calc non Af Amer 75 (*) >90 mL/min   GFR calc Af Amer 87 (*) >90 mL/min  URINALYSIS, ROUTINE W REFLEX MICROSCOPIC      Result Value Range   Color, Urine YELLOW  YELLOW   APPearance CLEAR  CLEAR   Specific Gravity, Urine 1.015  1.005 - 1.030   pH 6.0  5.0 - 8.0   Glucose, UA NEGATIVE  NEGATIVE mg/dL   Hgb urine dipstick SMALL (*) NEGATIVE   Bilirubin Urine NEGATIVE  NEGATIVE   Ketones, ur NEGATIVE  NEGATIVE mg/dL   Protein, ur 782 (*) NEGATIVE mg/dL   Urobilinogen, UA 0.2  0.0 - 1.0 mg/dL   Nitrite NEGATIVE  NEGATIVE   Leukocytes, UA NEGATIVE  NEGATIVE  URINE MICROSCOPIC-ADD ON      Result Value Range   RBC / HPF 0-2  <3 RBC/hpf      MDM  Patient with nausea, vomiting, and diarrhea since this morning. Given IVF, zofran, PPI, morphine with improvement. Able to take PO fluids. Pt stable in ED with no significant deterioration in condition.The patient appears reasonably screened and/or stabilized for discharge and I doubt any other medical condition or other [facility] requiring further screening, evaluation, or treatment in the ED at this time prior to discharge.  MDM Reviewed: nursing note and vitals Interpretation: labs           Nicoletta Dress. Colon Branch, MD 06/09/12 [phone]

## 2012-06-09 LAB — COMPREHENSIVE METABOLIC PANEL
Albumin: 4.3 g/dL (ref 3.5–5.2)
BUN: 9 mg/dL (ref 6–23)
Creatinine, Ser: 0.81 mg/dL (ref 0.50–1.10)
Potassium: 3.3 mEq/L — ABNORMAL LOW (ref 3.5–5.1)
Total Protein: 8.5 g/dL — ABNORMAL HIGH (ref 6.0–8.3)

## 2012-06-09 MED ORDER — ONDANSETRON 4 MG PO TBDP: 4.0000 mg | ORAL_TABLET | Freq: Three times a day (TID) | ORAL | Status: DC | PRN

## 2012-09-17 ENCOUNTER — Emergency Department (HOSPITAL_COMMUNITY): Payer: Medicare Other

## 2012-09-17 ENCOUNTER — Encounter (HOSPITAL_COMMUNITY): Payer: Self-pay

## 2012-09-17 ENCOUNTER — Emergency Department (HOSPITAL_COMMUNITY)
Admission: EM | Admit: 2012-09-17 | Discharge: 2012-09-17 | Disposition: A | Discharge status: Home / Self Care | Payer: Medicare Other | Attending: Emergency Medicine | Admitting: Emergency Medicine

## 2012-09-17 DIAGNOSIS — K8020 Calculus of gallbladder without cholecystitis without obstruction: Secondary | ICD-10-CM | POA: Insufficient documentation

## 2012-09-17 DIAGNOSIS — Z01810 Encounter for preprocedural cardiovascular examination: Secondary | ICD-10-CM | POA: Insufficient documentation

## 2012-09-17 DIAGNOSIS — Z8739 Personal history of other diseases of the musculoskeletal system and connective tissue: Secondary | ICD-10-CM | POA: Insufficient documentation

## 2012-09-17 DIAGNOSIS — Z87891 Personal history of nicotine dependence: Secondary | ICD-10-CM | POA: Insufficient documentation

## 2012-09-17 DIAGNOSIS — Z8553 Personal history of malignant neoplasm of renal pelvis: Secondary | ICD-10-CM | POA: Insufficient documentation

## 2012-09-17 DIAGNOSIS — Z8719 Personal history of other diseases of the digestive system: Secondary | ICD-10-CM | POA: Insufficient documentation

## 2012-09-17 DIAGNOSIS — Z86011 Personal history of benign neoplasm of the brain: Secondary | ICD-10-CM | POA: Insufficient documentation

## 2012-09-17 DIAGNOSIS — I10 Essential (primary) hypertension: Secondary | ICD-10-CM | POA: Insufficient documentation

## 2012-09-17 DIAGNOSIS — R112 Nausea with vomiting, unspecified: Secondary | ICD-10-CM | POA: Insufficient documentation

## 2012-09-17 DIAGNOSIS — E119 Type 2 diabetes mellitus without complications: Secondary | ICD-10-CM | POA: Insufficient documentation

## 2012-09-17 DIAGNOSIS — Z79899 Other long term (current) drug therapy: Secondary | ICD-10-CM | POA: Insufficient documentation

## 2012-09-17 DIAGNOSIS — Z87448 Personal history of other diseases of urinary system: Secondary | ICD-10-CM | POA: Insufficient documentation

## 2012-09-17 DIAGNOSIS — Z872 Personal history of diseases of the skin and subcutaneous tissue: Secondary | ICD-10-CM | POA: Insufficient documentation

## 2012-09-17 DIAGNOSIS — Z8659 Personal history of other mental and behavioral disorders: Secondary | ICD-10-CM | POA: Insufficient documentation

## 2012-09-17 DIAGNOSIS — K8050 Calculus of bile duct without cholangitis or cholecystitis without obstruction: Secondary | ICD-10-CM

## 2012-09-17 LAB — URINALYSIS, ROUTINE W REFLEX MICROSCOPIC
Leukocytes, UA: NEGATIVE
Nitrite: NEGATIVE
Specific Gravity, Urine: >1.03 — ABNORMAL HIGH (ref 1.005–1.030)
Urobilinogen, UA: 0.2 mg/dL (ref 0.0–1.0)

## 2012-09-17 LAB — URINE MICROSCOPIC-ADD ON

## 2012-09-17 LAB — CBC WITH DIFFERENTIAL/PLATELET
Eosinophils Absolute: 0.1 10*3/uL (ref 0.0–0.7)
Lymphocytes Relative: 12 % (ref 12–46)
Lymphs Abs: 1.3 10*3/uL (ref 0.7–4.0)
MCH: 28.2 pg (ref 26.0–34.0)
Neutrophils Relative %: 81 % — ABNORMAL HIGH (ref 43–77)
Platelets: 325 10*3/uL (ref 150–400)
RBC: 4.54 MIL/uL (ref 3.87–5.11)
WBC: 10.7 10*3/uL — ABNORMAL HIGH (ref 4.0–10.5)

## 2012-09-17 LAB — COMPREHENSIVE METABOLIC PANEL
ALT: 14 U/L (ref 0–35)
Albumin: 4 g/dL (ref 3.5–5.2)
Alkaline Phosphatase: 137 U/L — ABNORMAL HIGH (ref 39–117)
GFR calc Af Amer: 78 mL/min — ABNORMAL LOW (ref >90)
Glucose, Bld: 280 mg/dL — ABNORMAL HIGH (ref 70–99)
Potassium: 4.4 mEq/L (ref 3.5–5.1)
Sodium: 139 mEq/L (ref 135–145)
Total Protein: 8.4 g/dL — ABNORMAL HIGH (ref 6.0–8.3)

## 2012-09-17 MED ORDER — ONDANSETRON HCL 4 MG/2ML IJ SOLN
4.0000 mg | Freq: Once | INTRAMUSCULAR | Status: AC
  Administered 2012-09-17: 4 mg via INTRAVENOUS
  Filled 2012-09-17: qty 2

## 2012-09-17 MED ORDER — SODIUM CHLORIDE 0.9 % IV SOLN
1000.0000 mL | Freq: Once | INTRAVENOUS | Status: AC
  Administered 2012-09-17: 1000 mL via INTRAVENOUS

## 2012-09-17 MED ORDER — OXYCODONE-ACETAMINOPHEN 5-325 MG PO TABS: ORAL_TABLET | ORAL | Status: DC

## 2012-09-17 MED ORDER — ONDANSETRON HCL 4 MG PO TABS: 4.0000 mg | ORAL_TABLET | Freq: Four times a day (QID) | ORAL | Status: DC | PRN

## 2012-09-17 MED ORDER — SODIUM CHLORIDE 0.9 % IV SOLN
1000.0000 mL | INTRAVENOUS | Status: DC
  Administered 2012-09-17: 1000 mL via INTRAVENOUS

## 2012-09-17 MED ORDER — MORPHINE SULFATE 4 MG/ML IJ SOLN
4.0000 mg | Freq: Once | INTRAMUSCULAR | Status: AC
  Administered 2012-09-17: 4 mg via INTRAVENOUS
  Filled 2012-09-17: qty 1

## 2012-09-17 NOTE — ED Provider Notes (Signed)
History  This chart was scribed for Ward Givens, MD by Bennett Scrape, ED Scribe. This patient was seen in room APA17/APA17 and the patient's care was started at 3:03 PM.  CSN: [id number] Arrival date & time 09/17/12  1208   First MD Initiated Contact with Patient 09/17/12 1503     Chief Complaint  Patient presents with  . Abdominal Pain    Patient is a 67 y.o. female presenting with abdominal pain. The history is provided by the patient. No language interpreter was used.  Abdominal Pain This is a recurrent problem. The current episode started 6 to 12 hours ago. The problem occurs constantly. The problem has been gradually worsening. Associated symptoms include abdominal pain. Exacerbated by: touch. Nothing relieves the symptoms. She has tried a warm compress for the symptoms. The treatment provided no relief.    HPI Comments: Rebecca Richards is a 67 y.o. female who presents to the Emergency Department complaining of constant RUQ abdominal pain described as achy that woke her up around 4 AM this morning. She reports associated nausea and 2 episodes of emesis this morning but none since. She denies radiation to the back. She reports that the pain is aggravated by touch and admits that she has tried a heating pad with no improvement. She was recently diagnosed with DM and is currently on pills without insulin. She had a CT scan performed on April 5th, 2014 at the time for nausea and diarrhea but denies being told about gallstones noted. She states that she has noticed that greasy foods cause her pain for the past few months after being diagnosed with DM. She states that she had spaghetti last night but denies any use of spices or intake of otherwise greasy foods. She denies fevers, chills and diarrhea as associated symptoms. She admits that at baseline her CBGs are between 90 and 100, but she denies checking her CBG yet today.  PCP is Dr. Sherryll Burger  Past Medical History  Diagnosis Date  .  Depression   . Brain tumor     Surgery for benign brain tumor in the remote past  . Hypertension   . Renal disorder   . Preop cardiovascular exam     Cardiac clearance for knee surgery, January, 2013  . Edema   . Tobacco abuse   . Ejection fraction     EF 70%, echo, February, 2013  . Sleep apnea     uses Cpap  . Hepatitis 1970's    hep c  . 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 Anxiety   . Arthritis   . 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 Nasal congestion   . Diabetes mellitus without complication    Past Surgical History  Procedure Laterality Date  . Abdominal hysterectomy    . Knee surgery    . Nose surgery    . Back surgery      pt has stimulator in lower back and can't have an MRI  . 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 Total knee arthroplasty  07/21/2011    Procedure: TOTAL KNEE ARTHROPLASTY;  Surgeon: Thera Flake., MD;  Location: [facility] OR;  Service: Orthopedics;  Laterality: Left;   Family History  Problem Relation Age of Onset  . Cancer Mother   . Hypertension Mother   . Anesthesia problems Neg Hx    History  Substance Use Topics  . Smoking status: Former Smoker -- 0.50  packs/day for 10 years    Types: Cigarettes    Quit date: 02/19/2012  . Smokeless tobacco: Never Used     Comment: has cut down to 2 cigarettes daily  . Alcohol Use: No     Comment: none since 03-28-2011  retired, used to be a substance abuse counselor  OB History   Grav Para Term Preterm Abortions TAB SAB Ect Mult Living   1 1 1       1       Review of Systems  Constitutional: Negative for fever and chills.  Gastrointestinal: Positive for nausea, vomiting and abdominal pain. Negative for diarrhea.  All other systems reviewed and are negative.    Allergies  Review of patient's allergies indicates no known allergies.  Home Medications   Current Outpatient Rx  Name  Route  Sig  Dispense  Refill  . amLODipine (NORVASC) 10 MG tablet   Oral   Take 10 mg by mouth daily.         Marland Kitchen  Apoaequorin (PREVAGEN PO)   Oral   Take 1 tablet by mouth daily.         . Coenzyme Q10 (CO Q 10 PO)   Oral   Take 1 tablet by mouth 2 (two) times daily.         Marland Kitchen HYDROcodone-acetaminophen (NORCO) 10-325 MG per tablet   Oral   Take 1 tablet by mouth every 6 (six) hours as needed for pain.         Marland Kitchen lisinopril (PRINIVIL,ZESTRIL) 10 MG tablet   Oral   Take 10 mg by mouth daily.         . metFORMIN (GLUCOPHAGE) 500 MG tablet   Oral   Take 1 tablet (500 mg total) by mouth 2 (two) times daily with a meal.   60 tablet   0   . ondansetron (ZOFRAN ODT) 4 MG disintegrating tablet   Oral   Take 1 tablet (4 mg total) by mouth every 8 (eight) hours as needed for nausea.   20 tablet   0    Triage Vitals: BP 117/74  Pulse 76  Temp(Src) 97.5 F (36.4 C) (Oral)  Resp 20  Ht 5\' 1"  (1.549 m)  Wt 166 lb (75.297 kg)  BMI 31.38 kg/m2  SpO2 99%  Vital signs normal    Physical Exam  Nursing note and vitals reviewed. Constitutional: She is oriented to person, place, and time. She appears well-developed and well-nourished.  Non-toxic appearance. She does not appear ill. No distress.  HENT:  Head: Normocephalic and atraumatic.  Right Ear: External ear normal.  Left Ear: External ear normal.  Nose: Nose normal. No mucosal edema or rhinorrhea.  Mouth/Throat: Oropharynx is clear and moist and mucous membranes are normal. No dental abscesses or edematous.  Eyes: Conjunctivae and EOM are normal. Pupils are equal, round, and reactive to light.  Neck: Normal range of motion and full passive range of motion without pain. Neck supple.  Cardiovascular: Normal rate, regular rhythm and normal heart sounds.  Exam reveals no gallop and no friction rub.   No murmur heard. Pulmonary/Chest: Effort normal and breath sounds normal. No respiratory distress. She has no wheezes. She has no rhonchi. She has no rales. She exhibits no tenderness and no crepitus.  Abdominal: Soft. Normal appearance and  bowel sounds are normal. She exhibits no distension. There is tenderness (RUQ). There is no rebound and no guarding.    Musculoskeletal: Normal range of motion. She exhibits no  edema and no tenderness.  Moves all extremities well.   Neurological: She is alert and oriented to person, place, and time. She has normal strength. No cranial nerve deficit.  Skin: Skin is warm, dry and intact. No rash noted. No erythema. No pallor.  Psychiatric: She has a normal mood and affect. Her speech is normal and behavior is normal. Her mood appears not anxious.    ED Course  Procedures (including critical care time)  Medications  0.9 %  sodium chloride infusion (0 mLs Intravenous Stopped 09/17/12 1644)    Followed by  0.9 %  sodium chloride infusion (1,000 mLs Intravenous New Bag/Given 09/17/12 1644)  morphine 4 MG/ML injection 4 mg (4 mg Intravenous Given 09/17/12 1549)  ondansetron (ZOFRAN) injection 4 mg (4 mg Intravenous Given 09/17/12 1549)    DIAGNOSTIC STUDIES: Oxygen Saturation is 99% on room air, normal by my interpretation.    COORDINATION OF CARE: 3:19 PM-Discussed treatment plan which includes medications, [place], CBC panel, CMP and UA with pt at bedside and pt agreed to plan.   5:21 PM-Pt rechecked and feels improved with medications listed above. Informed pt of radiology and lab work results. Discussed avoiding greasy/spicy foods with pt and pt agreed to plan. Will consult surgery for further treatment plan decision.   5:45 PM-Consult complete with Dr. Leticia Penna, general surgery. Patient case explained and discussed. Dr. Leticia Penna agrees to follow up in office for further evaluation and treatment. He advised against antibiotics due to stable WBC. Call ended at 5:47 PM.  6:25 PM-Informed pt of consult with surgery. Discussed discharge plan which includes bland diet and taking pain medication as prescribed with pt and pt agreed to plan. Also advised pt to follow up with surgery referral tomorrow to  call for an appointmentand pt agreed. Addressed symptoms to return for with pt. Also discussed dietary restrictions to prevent further attacks.  Results for orders placed during the hospital encounter of 09/17/12  URINALYSIS, ROUTINE W REFLEX MICROSCOPIC      Result Value Range   Color, Urine YELLOW  YELLOW   APPearance CLEAR  CLEAR   Specific Gravity, Urine >1.030 (*) 1.005 - 1.030   pH 6.5  5.0 - 8.0   Glucose, UA 100 (*) NEGATIVE mg/dL   Hgb urine dipstick TRACE (*) NEGATIVE   Bilirubin Urine NEGATIVE  NEGATIVE   Ketones, ur NEGATIVE  NEGATIVE mg/dL   Protein, ur >161 (*) NEGATIVE mg/dL   Urobilinogen, UA 0.2  0.0 - 1.0 mg/dL   Nitrite NEGATIVE  NEGATIVE   Leukocytes, UA NEGATIVE  NEGATIVE  CBC WITH DIFFERENTIAL      Result Value Range   WBC 10.7 (*) 4.0 - 10.5 K/uL   RBC 4.54  3.87 - 5.11 MIL/uL   Hemoglobin 12.8  12.0 - 15.0 g/dL   HCT 09.6  04.5 - 40.9 %   MCV 85.5  78.0 - 100.0 fL   MCH 28.2  26.0 - 34.0 pg   MCHC 33.0  30.0 - 36.0 g/dL   RDW 81.1  91.4 - 78.2 %   Platelets 325  150 - 400 K/uL   Neutrophils Relative % 81 (*) 43 - 77 %   Neutro Abs 8.7 (*) 1.7 - 7.7 K/uL   Lymphocytes Relative 12  12 - 46 %   Lymphs Abs 1.3  0.7 - 4.0 K/uL   Monocytes Relative 6  3 - 12 %   Monocytes Absolute 0.6  0.1 - 1.0 K/uL   Eosinophils Relative 1  0 - 5 %   Eosinophils Absolute 0.1  0.0 - 0.7 K/uL   Basophils Relative 0  0 - 1 %   Basophils Absolute 0.0  0.0 - 0.1 K/uL  COMPREHENSIVE METABOLIC PANEL      Result Value Range   Sodium 139  135 - 145 mEq/L   Potassium 4.4  3.5 - 5.1 mEq/L   Chloride 97  96 - 112 mEq/L   CO2 31  19 - 32 mEq/L   Glucose, Bld 280 (*) 70 - 99 mg/dL   BUN 8  6 - 23 mg/dL   Creatinine, Ser 1.19  0.50 - 1.10 mg/dL   Calcium 14.7  8.4 - 82.9 mg/dL   Total Protein 8.4 (*) 6.0 - 8.3 g/dL   Albumin 4.0  3.5 - 5.2 g/dL   AST 27  0 - 37 U/L   ALT 14  0 - 35 U/L   Alkaline Phosphatase 137 (*) 39 - 117 U/L   Total Bilirubin 0.2 (*) 0.3 - 1.2 mg/dL    GFR calc non Af Amer 67 (*) >90 mL/min   GFR calc Af Amer 78 (*) >90 mL/min  LIPASE, BLOOD      Result Value Range   Lipase 33  11 - 59 U/L  URINE MICROSCOPIC-ADD ON      Result Value Range   Squamous Epithelial / LPF RARE  RARE   WBC, UA 0-2  <3 WBC/hpf   RBC / HPF 0-2  <3 RBC/hpf   Bacteria, UA RARE  RARE   Urine-Other MUCOUS PRESENT     Laboratory interpretation all normal except mildly elevated WBC   US Abdomen Limited Ruq  09/17/2012   *RADIOLOGY REPORT*  Clinical Data: Gallstone  LIMITED RIGHT UPPER QUADRANT ULTRASOUND  Comparison: 07/2011  Findings: The gallbladder contains a large 3.6 cm gallstone.  The gallbladder wall is thickened and heterogeneous with hyperechoic and hypoechoic signal.  Gallbladder wall edema cannot be excluded. Negative Murphy's sign.  Foci in the gallbladder are noted with ring down artifact.  The common bile duct is 6.6 mm in caliber.  The liver is diffusely increased echogenicity without focal mass.  IMPRESSION:  Cholelithiasis.  There is wall thickening gallbladder wall with possible edema. Correlate clinically as for the need for nuclear medicine imaging to rule out acute cholecystitis.  Sonographic Murphy's sign was noted been negative.  Probable diffuse hepatic steatosis.  Cholesterolosis of the gallbladder wall is noted.   Original Report Authenticated By: Jolaine Click, M.D.     1. Gallstones   2. Biliary colic     Discharge Medication List as of 09/17/2012  6:09 PM    START taking these medications   Details  ondansetron (ZOFRAN) 4 MG tablet Take 1 tablet (4 mg total) by mouth every 6 (six) hours as needed for nausea., Starting 09/17/2012, Until Discontinued, Print    oxyCODONE-acetaminophen (PERCOCET/ROXICET) 5-325 MG per tablet Take 1 or 2 po Q 6hrs for pain, Print        Plan discharge   Devoria Albe, MD, FACEP      MDM  I personally performed the services described in this documentation, which was scribed in my presence. The recorded  information has been reviewed and considered.  Devoria Albe, MD, FACEP   Ward Givens, MD 09/17/12 [phone]

## 2012-09-17 NOTE — ED Notes (Signed)
Pt c/o RUQ pain with nausea and vomiting that started this am.  Denies diarrhea.  Says has not eaten anything today.

## 2012-09-17 NOTE — ED Notes (Signed)
Patient with no complaints at this time. Respirations even and unlabored. Skin warm/dry. Discharge instructions reviewed with patient at this time. Patient given opportunity to voice concerns/ask questions. IV removed per policy and band-aid applied to site. Patient discharged at this time and left Emergency Department with steady gait.  

## 2012-09-24 ENCOUNTER — Encounter (HOSPITAL_COMMUNITY): Payer: Self-pay | Admitting: Pharmacy Technician

## 2012-09-25 ENCOUNTER — Encounter (HOSPITAL_COMMUNITY)
Admission: RE | Admit: 2012-09-25 | Discharge: 2012-09-25 | Disposition: A | Discharge status: Home / Self Care | Payer: Medicare Other | Source: Ambulatory Visit | Attending: General Surgery | Admitting: General Surgery

## 2012-09-25 ENCOUNTER — Encounter (HOSPITAL_COMMUNITY): Payer: Self-pay

## 2012-09-25 HISTORY — DX: Insomnia, unspecified: G47.00

## 2012-09-25 HISTORY — DX: Presence of other specified functional implants: Z96.89

## 2012-09-25 NOTE — Patient Instructions (Addendum)
Rebecca Richards  09/25/2012   Your procedure is scheduled on:  09/30/2012   Report to [facility] at  615  AM.  Call this number if you have problems the morning of surgery: [phone]   Remember:   Do not eat food or drink liquids after midnight.   Take these medicines the morning of surgery with A SIP OF WATER: norvasc, lisinopril, zofran, oxycocodone   Do not wear jewelry, make-up or nail polish.  Do not wear lotions, powders, or perfumes.  Do not shave 48 hours prior to surgery. Men may shave face and neck.  Do not bring valuables to the hospital.  [facility] is not responsible  for any belongings or valuables.  Contacts, dentures or bridgework may not be worn into surgery.  Leave suitcase in the car. After surgery it may be brought to your room.  For patients admitted to the hospital, checkout time is 11:00 AM the day of discharge.   Patients discharged the day of surgery will not be allowed to drive  home.  Name and phone number of your driver: family  Special Instructions: Shower using CHG 2 nights before surgery and the night before surgery.  If you shower the day of surgery use CHG.  Use special wash - you have one bottle of CHG for all showers.  You should use approximately 1/3 of the bottle for each shower.   Please read over the following fact sheets that you were given: Pain Booklet, Coughing and Deep Breathing, MRSA Information, Surgical Site Infection Prevention, Anesthesia Post-op Instructions and Care and Recovery After Surgery Laparoscopic Cholecystectomy Laparoscopic cholecystectomy is surgery to remove the gallbladder. The gallbladder is located slightly to the right of center in the abdomen, behind the liver. It is a concentrating and storage sac for the bile produced in the liver. Bile aids in the digestion and absorption of fats. Gallbladder disease (cholecystitis) is an inflammation of your gallbladder. This condition is usually caused by a buildup of gallstones  (cholelithiasis) in your gallbladder. Gallstones can block the flow of bile, resulting in inflammation and pain. In severe cases, emergency surgery may be required. When emergency surgery is not required, you will have time to prepare for the procedure. Laparoscopic surgery is an alternative to open surgery. Laparoscopic surgery usually has a shorter recovery time. Your common bile duct may also need to be examined and explored. Your caregiver will discuss this with you if he or she feels this should be done. If stones are found in the common bile duct, they may be removed. LET YOUR CAREGIVER KNOW ABOUT:  Allergies to food or medicine.  Medicines taken, including vitamins, herbs, eyedrops, over-the-counter medicines, and creams.  Use of steroids (by mouth or creams).  Previous problems with anesthetics or numbing medicines.  History of bleeding problems or blood clots.  Previous surgery.  Other health problems, including diabetes and kidney problems.  Possibility of pregnancy, if this applies. RISKS AND COMPLICATIONS All surgery is associated with risks. Some problems that may occur following this procedure include:  Infection.  Damage to the common bile duct, nerves, arteries, veins, or other internal organs such as the stomach or intestines.  Bleeding.  A stone may remain in the common bile duct. BEFORE THE PROCEDURE  Do not take aspirin for 3 days prior to surgery or blood thinners for 1 week prior to surgery.  Do not eat or drink anything after midnight the night before surgery.  Let your  caregiver know if you develop a cold or other infectious problem prior to surgery.  You should be present 60 minutes before the procedure or as directed. PROCEDURE  You will be given medicine that makes you sleep (general anesthetic). When you are asleep, your surgeon will make several small cuts (incisions) in your abdomen. One of these incisions is used to insert a small, lighted scope  (laparoscope) into the abdomen. The laparoscope helps the surgeon see into your abdomen. Carbon dioxide gas will be pumped into your abdomen. The gas allows more room for the surgeon to perform your surgery. Other operating instruments are inserted through the other incisions. Laparoscopic procedures may not be appropriate when:  There is major scarring from previous surgery.  The gallbladder is extremely inflamed.  There are bleeding disorders or unexpected cirrhosis of the liver.  A pregnancy is near term.  Other conditions make the laparoscopic procedure impossible. If your surgeon feels it is not safe to continue with a laparoscopic procedure, he or she will perform an open abdominal procedure. In this case, the surgeon will make an incision to open the abdomen. This gives the surgeon a larger view and field to work within. This may allow the surgeon to perform procedures that sometimes cannot be performed with a laparoscope alone. Open surgery has a longer recovery time. AFTER THE PROCEDURE  You will be taken to the recovery area where a nurse will watch and check your progress.  You may be allowed to go home the same day.  Do not resume physical activities until directed by your caregiver.  You may resume a normal diet and activities as directed. Document Released: 03/13/2005 Document Revised: 06/05/2011 Document Reviewed: 08/26/2010 [facility] Patient Information 2014 [place], [place]. PATIENT INSTRUCTIONS POST-ANESTHESIA  IMMEDIATELY FOLLOWING SURGERY:  Do not drive or operate machinery for the first twenty four hours after surgery.  Do not make any important decisions for twenty four hours after surgery or while taking narcotic pain medications or sedatives.  If you develop intractable nausea and vomiting or a severe headache please notify your doctor immediately.  FOLLOW-UP:  Please make an appointment with your surgeon as instructed. You do not need to follow up with anesthesia  unless specifically instructed to do so.  WOUND CARE INSTRUCTIONS (if applicable):  Keep a dry clean dressing on the anesthesia/puncture wound site if there is drainage.  Once the wound has quit draining you may leave it open to air.  Generally you should leave the bandage intact for twenty four hours unless there is drainage.  If the epidural site drains for more than 36-48 hours please call the anesthesia department.  QUESTIONS?:  Please feel free to call your physician or the hospital operator if you have any questions, and they will be happy to assist you.

## 2012-09-30 ENCOUNTER — Ambulatory Visit (HOSPITAL_COMMUNITY): Payer: Medicare Other | Admitting: Anesthesiology

## 2012-09-30 ENCOUNTER — Encounter (HOSPITAL_COMMUNITY): Payer: Self-pay | Admitting: *Deleted

## 2012-09-30 ENCOUNTER — Encounter (HOSPITAL_COMMUNITY): Payer: Self-pay | Admitting: Anesthesiology

## 2012-09-30 ENCOUNTER — Encounter (HOSPITAL_COMMUNITY)
Admission: RE | Disposition: A | Discharge status: Home / Self Care | Payer: Self-pay | Source: Ambulatory Visit | Attending: General Surgery

## 2012-09-30 ENCOUNTER — Ambulatory Visit (HOSPITAL_COMMUNITY)
Admission: RE | Admit: 2012-09-30 | Discharge: 2012-09-30 | Disposition: A | Discharge status: Home / Self Care | Payer: Medicare Other | Source: Ambulatory Visit | Attending: General Surgery | Admitting: General Surgery

## 2012-09-30 DIAGNOSIS — Z01810 Encounter for preprocedural cardiovascular examination: Secondary | ICD-10-CM | POA: Insufficient documentation

## 2012-09-30 DIAGNOSIS — K8020 Calculus of gallbladder without cholecystitis without obstruction: Secondary | ICD-10-CM | POA: Insufficient documentation

## 2012-09-30 DIAGNOSIS — E119 Type 2 diabetes mellitus without complications: Secondary | ICD-10-CM | POA: Insufficient documentation

## 2012-09-30 DIAGNOSIS — I10 Essential (primary) hypertension: Secondary | ICD-10-CM | POA: Insufficient documentation

## 2012-09-30 DIAGNOSIS — Z01812 Encounter for preprocedural laboratory examination: Secondary | ICD-10-CM | POA: Insufficient documentation

## 2012-09-30 HISTORY — PX: CHOLECYSTECTOMY: SHX55

## 2012-09-30 LAB — GLUCOSE, CAPILLARY: Glucose-Capillary: 190 mg/dL — ABNORMAL HIGH (ref 70–99)

## 2012-09-30 SURGERY — LAPAROSCOPIC CHOLECYSTECTOMY
Anesthesia: General | Wound class: Clean Contaminated

## 2012-09-30 MED ORDER — LACTATED RINGERS IV SOLN
INTRAVENOUS | Status: DC
  Administered 2012-09-30: 09:00:00 via INTRAVENOUS
  Administered 2012-09-30: 1000 mL via INTRAVENOUS

## 2012-09-30 MED ORDER — BUPIVACAINE HCL (PF) 0.5 % IJ SOLN
INTRAMUSCULAR | Status: DC | PRN
  Administered 2012-09-30: 10 mL

## 2012-09-30 MED ORDER — PROPOFOL 10 MG/ML IV BOLUS
INTRAVENOUS | Status: DC | PRN
  Administered 2012-09-30: 200 mg via INTRAVENOUS

## 2012-09-30 MED ORDER — LIDOCAINE HCL (CARDIAC) 20 MG/ML IV SOLN
INTRAVENOUS | Status: DC | PRN
  Administered 2012-09-30: 50 mg via INTRAVENOUS

## 2012-09-30 MED ORDER — CEFAZOLIN SODIUM-DEXTROSE 2-3 GM-% IV SOLR
2.0000 g | INTRAVENOUS | Status: AC
  Administered 2012-09-30: 2 g via INTRAVENOUS

## 2012-09-30 MED ORDER — FENTANYL CITRATE 0.05 MG/ML IJ SOLN
25.0000 ug | INTRAMUSCULAR | Status: DC | PRN
  Administered 2012-09-30 (×3): 50 ug via INTRAVENOUS

## 2012-09-30 MED ORDER — ROCURONIUM BROMIDE 50 MG/5ML IV SOLN
INTRAVENOUS | Status: AC
  Filled 2012-09-30: qty 1

## 2012-09-30 MED ORDER — ROCURONIUM BROMIDE 100 MG/10ML IV SOLN
INTRAVENOUS | Status: DC | PRN
  Administered 2012-09-30: 50 mg via INTRAVENOUS

## 2012-09-30 MED ORDER — ONDANSETRON HCL 4 MG/2ML IJ SOLN: 4.0000 mg | Freq: Once | INTRAMUSCULAR | Status: DC | PRN

## 2012-09-30 MED ORDER — GLYCOPYRROLATE 0.2 MG/ML IJ SOLN
0.2000 mg | Freq: Once | INTRAMUSCULAR | Status: AC
  Administered 2012-09-30: 0.2 mg via INTRAVENOUS

## 2012-09-30 MED ORDER — SODIUM CHLORIDE 0.9 % IR SOLN
Status: DC | PRN
  Administered 2012-09-30: 1000 mL

## 2012-09-30 MED ORDER — ENOXAPARIN SODIUM 40 MG/0.4ML ~~LOC~~ SOLN
SUBCUTANEOUS | Status: AC
  Filled 2012-09-30: qty 0.4

## 2012-09-30 MED ORDER — DEXAMETHASONE SODIUM PHOSPHATE 4 MG/ML IJ SOLN
4.0000 mg | Freq: Once | INTRAMUSCULAR | Status: AC
  Administered 2012-09-30: 4 mg via INTRAVENOUS

## 2012-09-30 MED ORDER — NEOSTIGMINE METHYLSULFATE 1 MG/ML IJ SOLN
INTRAMUSCULAR | Status: AC
  Filled 2012-09-30: qty 1

## 2012-09-30 MED ORDER — CEFAZOLIN SODIUM-DEXTROSE 2-3 GM-% IV SOLR
INTRAVENOUS | Status: AC
  Filled 2012-09-30: qty 50

## 2012-09-30 MED ORDER — FENTANYL CITRATE 0.05 MG/ML IJ SOLN
INTRAMUSCULAR | Status: AC
  Filled 2012-09-30: qty 2

## 2012-09-30 MED ORDER — BUPIVACAINE HCL (PF) 0.5 % IJ SOLN
INTRAMUSCULAR | Status: AC
  Filled 2012-09-30: qty 30

## 2012-09-30 MED ORDER — GLYCOPYRROLATE 0.2 MG/ML IJ SOLN
INTRAMUSCULAR | Status: DC | PRN
  Administered 2012-09-30: .5 mg via INTRAVENOUS

## 2012-09-30 MED ORDER — MIDAZOLAM HCL 2 MG/2ML IJ SOLN
INTRAMUSCULAR | Status: AC
  Filled 2012-09-30: qty 2

## 2012-09-30 MED ORDER — MIDAZOLAM HCL 2 MG/2ML IJ SOLN
1.0000 mg | INTRAMUSCULAR | Status: DC | PRN
  Administered 2012-09-30 (×2): 2 mg via INTRAVENOUS

## 2012-09-30 MED ORDER — OXYCODONE-ACETAMINOPHEN 5-325 MG PO TABS: 1.0000 | ORAL_TABLET | ORAL | Status: DC | PRN

## 2012-09-30 MED ORDER — PROPOFOL 10 MG/ML IV EMUL
INTRAVENOUS | Status: AC
  Filled 2012-09-30: qty 40

## 2012-09-30 MED ORDER — GLYCOPYRROLATE 0.2 MG/ML IJ SOLN
INTRAMUSCULAR | Status: AC
  Filled 2012-09-30: qty 1

## 2012-09-30 MED ORDER — NEOSTIGMINE METHYLSULFATE 1 MG/ML IJ SOLN
INTRAMUSCULAR | Status: DC | PRN
  Administered 2012-09-30: 4 mg via INTRAVENOUS

## 2012-09-30 MED ORDER — DEXAMETHASONE SODIUM PHOSPHATE 4 MG/ML IJ SOLN
INTRAMUSCULAR | Status: AC
  Filled 2012-09-30: qty 1

## 2012-09-30 MED ORDER — SUCCINYLCHOLINE CHLORIDE 20 MG/ML IJ SOLN
INTRAMUSCULAR | Status: AC
  Filled 2012-09-30: qty 1

## 2012-09-30 MED ORDER — ONDANSETRON HCL 4 MG/2ML IJ SOLN
4.0000 mg | Freq: Once | INTRAMUSCULAR | Status: AC
  Administered 2012-09-30: 4 mg via INTRAVENOUS

## 2012-09-30 MED ORDER — LIDOCAINE HCL (PF) 1 % IJ SOLN
INTRAMUSCULAR | Status: AC
  Filled 2012-09-30: qty 5

## 2012-09-30 MED ORDER — CELECOXIB 100 MG PO CAPS
400.0000 mg | ORAL_CAPSULE | Freq: Every day | ORAL | Status: AC
  Administered 2012-09-30: 400 mg via ORAL

## 2012-09-30 MED ORDER — ONDANSETRON HCL 4 MG/2ML IJ SOLN
INTRAMUSCULAR | Status: AC
  Filled 2012-09-30: qty 2

## 2012-09-30 MED ORDER — GLYCOPYRROLATE 0.2 MG/ML IJ SOLN
INTRAMUSCULAR | Status: AC
  Filled 2012-09-30: qty 2

## 2012-09-30 MED ORDER — CHLORHEXIDINE GLUCONATE 4 % EX LIQD: 1.0000 "application " | Freq: Once | CUTANEOUS | Status: DC

## 2012-09-30 MED ORDER — CELECOXIB 100 MG PO CAPS
ORAL_CAPSULE | ORAL | Status: AC
  Filled 2012-09-30: qty 4

## 2012-09-30 MED ORDER — ENOXAPARIN SODIUM 40 MG/0.4ML ~~LOC~~ SOLN
40.0000 mg | Freq: Once | SUBCUTANEOUS | Status: AC
  Administered 2012-09-30: 40 mg via SUBCUTANEOUS

## 2012-09-30 MED ORDER — FENTANYL CITRATE 0.05 MG/ML IJ SOLN
INTRAMUSCULAR | Status: DC | PRN
  Administered 2012-09-30: 100 ug via INTRAVENOUS
  Administered 2012-09-30: 50 ug via INTRAVENOUS

## 2012-09-30 SURGICAL SUPPLY — 42 items
APL SKNCLS STERI-STRIP NONHPOA (GAUZE/BANDAGES/DRESSINGS) ×1
APPLIER CLIP UNV 5X34 EPIX (ENDOMECHANICALS) ×2 IMPLANT
APR XCLPCLP 20M/L UNV 34X5 (ENDOMECHANICALS) ×1
BAG HAMPER (MISCELLANEOUS) ×2 IMPLANT
BAG SPEC RTRVL LRG 6X4 10 (ENDOMECHANICALS) ×1
BENZOIN TINCTURE PRP APPL 2/3 (GAUZE/BANDAGES/DRESSINGS) ×2 IMPLANT
CLOTH BEACON ORANGE TIMEOUT ST (SAFETY) ×2 IMPLANT
COVER LIGHT HANDLE STERIS (MISCELLANEOUS) ×4 IMPLANT
DECANTER SPIKE VIAL GLASS SM (MISCELLANEOUS) ×2 IMPLANT
DEVICE TROCAR PUNCTURE CLOSURE (ENDOMECHANICALS) ×2 IMPLANT
DURAPREP 26ML APPLICATOR (WOUND CARE) ×2 IMPLANT
ELECT REM PT RETURN 9FT ADLT (ELECTROSURGICAL) ×2
ELECTRODE REM PT RTRN 9FT ADLT (ELECTROSURGICAL) ×1 IMPLANT
FILTER SMOKE EVAC LAPAROSHD (FILTER) ×2 IMPLANT
FORMALIN 10 PREFIL 120ML (MISCELLANEOUS) ×2 IMPLANT
GLOVE BIOGEL PI IND STRL 7.0 (GLOVE) ×4 IMPLANT
GLOVE BIOGEL PI IND STRL 7.5 (GLOVE) ×1 IMPLANT
GLOVE BIOGEL PI INDICATOR 7.0 (GLOVE) ×4
GLOVE BIOGEL PI INDICATOR 7.5 (GLOVE) ×1
GLOVE ECLIPSE 6.5 STRL STRAW (GLOVE) ×4 IMPLANT
GLOVE ECLIPSE 7.0 STRL STRAW (GLOVE) ×3 IMPLANT
GOWN STRL REIN XL XLG (GOWN DISPOSABLE) ×6 IMPLANT
HEMOSTAT SNOW SURGICEL 2X4 (HEMOSTASIS) IMPLANT
INST SET LAPROSCOPIC AP (KITS) ×2 IMPLANT
IV NS IRRIG 3000ML ARTHROMATIC (IV SOLUTION) IMPLANT
KIT ROOM TURNOVER APOR (KITS) ×2 IMPLANT
MANIFOLD NEPTUNE II (INSTRUMENTS) ×2 IMPLANT
NEEDLE INSUFFLATION 14GA 120MM (NEEDLE) ×2 IMPLANT
PACK LAP CHOLE LZT030E (CUSTOM PROCEDURE TRAY) ×2 IMPLANT
PAD ARMBOARD 7.5X6 YLW CONV (MISCELLANEOUS) ×2 IMPLANT
POUCH SPECIMEN RETRIEVAL 10MM (ENDOMECHANICALS) ×2 IMPLANT
SET BASIN LINEN APH (SET/KITS/TRAYS/PACK) ×2 IMPLANT
SET TUBE IRRIG SUCTION NO TIP (IRRIGATION / IRRIGATOR) IMPLANT
SLEEVE Z-THREAD 5X100MM (TROCAR) ×4 IMPLANT
STRIP CLOSURE SKIN 1/2X4 (GAUZE/BANDAGES/DRESSINGS) ×2 IMPLANT
SUT MNCRL AB 4-0 PS2 18 (SUTURE) ×4 IMPLANT
SUT VIC AB 2-0 CT2 27 (SUTURE) ×2 IMPLANT
SUT VICRYL 0 UR6 27IN ABS (SUTURE) ×1 IMPLANT
TROCAR Z-THRD FIOS HNDL 11X100 (TROCAR) ×2 IMPLANT
TROCAR Z-THREAD FIOS 5X100MM (TROCAR) ×2 IMPLANT
TUBING INSUFFLATION (TUBING) ×2 IMPLANT
WARMER LAPAROSCOPE (MISCELLANEOUS) ×2 IMPLANT

## 2012-09-30 NOTE — H&P (Signed)
NT[facility] SOAP Note  Vital Signs:  Vitals as of: 09/24/2012: Systolic 143: Diastolic 86: Heart Rate 90: Temp 96.58F: Height 78ft 1in: Weight 170Lbs 0 Ounces: Pain Level 9: BMI 32.12  BMI : 32.12 kg/m2  Subjective: This 67 Years 35 Months old Female presents for of  presents with several month history of increasing abdominal pain. States that the pain initially started as indigestion. Pain is frequently retrosternal. No significant radiation. There is associated nausea and occasional emesis. No history of hematemesis. She does state that spicy foods. Make it worse including things like spaghetti and tomato sauces. She did have a history of jaundice in the past but she correlates this with what she states was suspected hepatitis C. When asked she's been diagnosed she is unsure. She describes the pain is constant but frequently and all 18 pain. Pain does increase sometimes with lying down. She is unsure if there is any correlation with fatty greasy foods. No significant family history of biliary disease. Possible Native American ancestry.  Review of Symptoms:  :   blurred vision      chest pain    frequent cough      as per history of present illness    peripheral paresthesias, arthralgias or joint and back  skin rashes and dry skin      sluggish   Past Medical History:  Obtained     Past Medical History  Pregnancy Gravida: 1 Pregnancy Para: 1 Surgical History: Brain surgery, back surgery, knee surgery, hysterectomy Medical Problems: hypertension, chronic back pain, diabetes mellitus,  questionable history of hepatitis C Psychiatric History: none Allergies: no known drug allergies Medications: lisinopril, oxycodone, amlodipine, Glucophage   Social History:Obtained  Social History  Preferred Language: English Race:  Black or African American Ethnicity: Not Hispanic / Latino Age: 67 Years 6 Months Marital Status:  S Alcohol: none Recreational drug(s): past  history none  current   Smoking Status: Never smoker reviewed on 09/25/2012 Functional Status reviewed on mm/dd/yyyy ------------------------------------------------ Bathing: Normal Cooking: Normal Dressing: Normal Driving: Normal Eating: Normal Managing Meds: Normal Oral Care: Normal Shopping: Normal Toileting: Normal Transferring: Normal Walking: Normal Cognitive Status reviewed on mm/dd/yyyy ------------------------------------------------ Attention: Normal Decision Making: Normal Language: Normal Memory: Normal Motor: Normal Perception: Normal Problem Solving: Normal Visual and Spatial: Normal   Family History:Obtained    Family Health History Mother  Father  Other Family Member, Living; Healthy; coronary artery disease, diabetes  mellitus    Objective Information: General:  Well appearing, well nourished in no distress. Obese Skin:     no rash or prominent lesions Head:Atraumatic; no masses; no abnormalities Eyes:  conjunctiva clear, EOM intact, PERRL Mouth:  Mucous membranes moist, no mucosal lesions. Neck:  Supple without lymphadenopathy.  Heart:  RRR, no murmur Lungs:    CTA bilaterally, no wheezes, rhonchi, rales.  Breathing unlabored. Abdomen:Soft, /ND, no HSM, no masses.   moderate right upper quadrant and epigastric abdominal tenderness. Positive Murphy sign. No diffuse peritoneal signs the      right upper quadrant ultrasound: Gallstones Assessment:    Plan:  cholelithiasis, reflux. I do feel patient has likely symptomatology from both etiologies. I did discuss options at length with the patient including but conservative as well as operative options. At this time she will be initiated on a PPI. She does wish to proceed with removal of the gallbladder and this will be scheduled for next week. She doesn't understand that not all of her symptoms are likely related to a biliary etiology and that  some of her  symptomatology may persist. Should her symptoms persist afterwords possible endoscopy would be required and patient is aware of this.  Patient Education:Alternative treatments to surgery were discussed with patient (and family).  Risks and benefits  of procedure were fully explained to the patient (and family) who gave informed consent. Patient/family questions were addressed.  Follow-up:Pending Surgery

## 2012-09-30 NOTE — Interval H&P Note (Signed)
History and Physical Interval Note:  09/30/2012 7:48 AM  Rebecca Richards  has presented today for surgery, with the diagnosis of cholelithiasis  The various methods of treatment have been discussed with the patient and family. After consideration of risks, benefits and other options for treatment, the patient has consented to  Procedure(s): LAPAROSCOPIC CHOLECYSTECTOMY (N/A) as a surgical intervention .  The patient's history has been reviewed, patient examined, no change in status, stable for surgery.  I have reviewed the patient's chart and labs.  Questions were answered to the patient's satisfaction.     Jamail Cullers C

## 2012-09-30 NOTE — Op Note (Signed)
Patient:  Rebecca Richards  DOB:  1945/06/22  MRN:  [id number]   Preop Diagnosis:  Cholelithiasis  Postop Diagnosis:  The same  Procedure:  Laparoscopic cholecystectomy  Surgeon:  Dr. Tilford Pillar  Anes:  General endotracheal, 0.5% Sensorcaine plain for local  Indications:  Patient is a 67 year old female presented my office with a history of right and left upper quadrant abdominal pain. Workup and evaluation demonstrated cholelithiasis. While I cannot confirm all of her symptomatology related to a biliary etiology she did have significant symptoms which would correlate to a gallbladder etiology. Risks benefits alternatives of a laparoscopic possible open cholecystectomy were discussed at length with the patient including but not limited to risk of bleeding, infection, bile leak, small bowel injury, common bile duct injury, intraoperative cardiac component events. Patient's questions and concerns are addressed the patient as consented for the planned procedure.  Procedure note:  Patient was taken to the operator is placed in a supine position on the or table time the general anesthetic is administered. Once patient was asleep she symmetrically intubated a nurse anesthetist. At this point her abdomen is prepped with DuraPrep solution and draped in standard fashion. Time out was performed. Stab incision was created supraumbilically with 11 blade scalpel with additional dissection down to subcuticular tissue carried out using a Coker clamp which he utilized grasp the anterior normal fascia and lift his anteriorly. A Veress needle is inserted saline drop test is utilized confirm intraperitoneal placement. At this time the pneumoperitoneum was initiated once sufficient pneumoperitoneum was obtained a 11 mm insert over a laparoscope allowing visualization the trocar entering into the peritoneal cavity. At this point the inner cannulas removed the laparoscope was reinserted there is no evidence of a  trocar or Veress needle placement injury.  At this time the remaining trochars replaced a 5 mm can epigastrium, 5 mm in the midline, and a 5 mm in the right lateral abdominal wall. Patient's placed into a reverse Trendelenburg position. The fundus of the gallbladder is grasped and lifted up and over the right lobe the liver. Blunt dissection is carried out to strip the peritoneal flexion off of the infundibulum exposing both the cystic duct and cystic arteries he entered into the infundibulum. A window was created by both the structures. 3 endoclips placed optimally one distally and the cystic duct and the cystic duct divided between 2 most distal clips. Similarly the cystic artery is ligated using 2 endoclips approximately one distally and the cystic arteries divided between 2 most is a clips. At this time electrocautery was utilized dissect the gallbladder free from the gallbladder fossa. Once gallbladder is free is placed into an Endo Catch bag and placed into the right lower quadrant. To facilitate this the 10 mm scope is exchanged for a 5 mm scope. Inspection the gallbladder fossa indicated good hemostasis with no evidence of any bleeding. The endoclips were inspected there is no evidence of any bleeding or bile leak. At this time I did termite attention to closure.  Using an Endo Close suture passing device a 2-0 Vicryl sutures passed to the umbilical trocar site. During inspection of the peritoneal cavity was also noted patient did have significant amounts of intra-abdominal effusions mostly in the lower abdomen. This appeared to mostly involve the sigmoid colon. No abnormal nodularity or masses were seen visually. At this time the gallbladder was retrieved was removed through the umbilical trocar site. Additional sharp and blunt dilatation was trocar to adequately enlarged the umbilical trocar site  enough to remove the gallbladder. During this manipulation the bag was torn. It was inspected and was noted  be complete. Upon removal of the gallbladder it was placed in the back table sent as a permanent specimen to pathology. The trocar site was copiously irrigated. The fascia was additionally closed with an additional 2-0 Vicryl suture. At this point the pneumoperitoneum was evacuated. The remaining trochars were removed. The local anesthetic was instilled after the Vicryl sutures were secured. The skin edges at all 4 trocar sites was reapproximated using a 4-0 Monocryl in a running subcuticular suture. The skin was washed dried moist dry towel. Benzoin is applied around incision. Half-inch are suture placed. The drapes were removed the patient left come out of general anesthetic. She stretcher the PACU in stable condition. At the conclusion of procedure all instrument, sponge, needle counts are correct. Patient tolerated procedure extremely well.  Complications:  None apparent  EBL:  Less than 50 ML's.  Specimen:  Gallbladder

## 2012-09-30 NOTE — Anesthesia Postprocedure Evaluation (Signed)
  Anesthesia Post-op Note  Patient: Rebecca Richards  Procedure(s) Performed: Procedure(s): LAPAROSCOPIC CHOLECYSTECTOMY (N/A)  Patient Location: PACU  Anesthesia Type:General  Level of Consciousness: awake, alert  and oriented  Airway and Oxygen Therapy: Patient Spontanous Breathing and Patient connected to face mask oxygen  Post-op Pain: none  Post-op Assessment: Post-op Vital signs reviewed, Patient's Cardiovascular Status Stable and Respiratory Function Stable  Post-op Vital Signs: Reviewed and stable  Complications: No apparent anesthesia complications

## 2012-09-30 NOTE — Anesthesia Procedure Notes (Signed)
Procedure Name: Intubation Date/Time: 09/30/2012 7:59 AM Performed by: Caren Macadam Pre-anesthesia Checklist: Patient identified, Emergency Drugs available, Suction available and Patient being monitored Patient Re-evaluated:Patient Re-evaluated prior to inductionOxygen Delivery Method: Circle System Utilized Preoxygenation: Pre-oxygenation with 100% oxygen Intubation Type: IV induction Ventilation: Mask ventilation without difficulty Laryngoscope Size: Miller and 2 Grade View: Grade I Tube type: Oral Tube size: 7.0 mm Number of attempts: 1 Airway Equipment and Method: stylet and oral airway Placement Confirmation: ETT inserted through vocal cords under direct vision,  positive ETCO2 and breath sounds checked- equal and bilateral Secured at: 22 cm Tube secured with: Tape Dental Injury: Teeth and Oropharynx as per pre-operative assessment

## 2012-09-30 NOTE — Transfer of Care (Signed)
Immediate Anesthesia Transfer of Care Note  Patient: Rebecca Richards  Procedure(s) Performed: Procedure(s): LAPAROSCOPIC CHOLECYSTECTOMY (N/A)  Patient Location: PACU  Anesthesia Type:General  Level of Consciousness: awake and alert   Airway & Oxygen Therapy: Patient Spontanous Breathing and Patient connected to face mask oxygen  Post-op Assessment: Report given to PACU RN and Post -op Vital signs reviewed and stable  Post vital signs: Reviewed and stable  Complications: No apparent anesthesia complications

## 2012-09-30 NOTE — Anesthesia Preprocedure Evaluation (Signed)
Anesthesia Evaluation  Patient identified by MRN, date of birth, ID band Patient awake    Reviewed: Allergy & Precautions, H&P , NPO status , Patient's Chart, lab work & pertinent test results  History of Anesthesia Complications Negative for: history of anesthetic complications  Airway Mallampati: II TM Distance: >3 FB Neck ROM: Full    Dental  (+) Teeth Intact, Chipped and Dental Advisory Given,    Pulmonary sleep apnea and Continuous Positive Airway Pressure Ventilation , Current Smoker,  breath sounds clear to auscultation        Cardiovascular hypertension, Pt. on medications Rhythm:Regular Rate:Tachycardia     Neuro/Psych Anxiety    GI/Hepatic (+) Hepatitis -? Hepatitis history   Endo/Other    Renal/GU      Musculoskeletal   Abdominal   Peds  Hematology   Anesthesia Other Findings   Reproductive/Obstetrics                           Anesthesia Physical Anesthesia Plan  ASA: III  Anesthesia Plan: General   Post-op Pain Management:    Induction: Intravenous  Airway Management Planned: Oral ETT  Additional Equipment:   Intra-op Plan:   Post-operative Plan: Extubation in OR  Informed Consent: I have reviewed the patients History and Physical, chart, labs and discussed the procedure including the risks, benefits and alternatives for the proposed anesthesia with the patient or authorized representative who has indicated his/her understanding and acceptance.   Dental advisory given  Plan Discussed with: Surgeon and CRNA  Anesthesia Plan Comments:         Anesthesia Quick Evaluation

## 2012-10-02 ENCOUNTER — Encounter (HOSPITAL_COMMUNITY): Payer: Self-pay | Admitting: General Surgery

## 2012-12-26 ENCOUNTER — Other Ambulatory Visit (HOSPITAL_COMMUNITY): Payer: Self-pay | Admitting: Internal Medicine

## 2012-12-26 DIAGNOSIS — Z139 Encounter for screening, unspecified: Secondary | ICD-10-CM

## 2013-01-02 ENCOUNTER — Ambulatory Visit (HOSPITAL_COMMUNITY)
Admission: RE | Admit: 2013-01-02 | Discharge: 2013-01-02 | Disposition: A | Discharge status: Home / Self Care | Payer: Medicare Other | Source: Ambulatory Visit | Attending: Internal Medicine | Admitting: Internal Medicine

## 2013-01-02 DIAGNOSIS — Z1231 Encounter for screening mammogram for malignant neoplasm of breast: Secondary | ICD-10-CM | POA: Insufficient documentation

## 2013-01-02 DIAGNOSIS — Z139 Encounter for screening, unspecified: Secondary | ICD-10-CM

## 2013-01-11 ENCOUNTER — Encounter (HOSPITAL_COMMUNITY): Payer: Self-pay | Admitting: Emergency Medicine

## 2013-01-11 ENCOUNTER — Emergency Department (HOSPITAL_COMMUNITY): Payer: Medicare Other

## 2013-01-11 ENCOUNTER — Emergency Department (HOSPITAL_COMMUNITY)
Admission: EM | Admit: 2013-01-11 | Discharge: 2013-01-11 | Disposition: A | Discharge status: Home / Self Care | Payer: Medicare Other | Attending: Emergency Medicine | Admitting: Emergency Medicine

## 2013-01-11 DIAGNOSIS — Z87448 Personal history of other diseases of urinary system: Secondary | ICD-10-CM | POA: Insufficient documentation

## 2013-01-11 DIAGNOSIS — R059 Cough, unspecified: Secondary | ICD-10-CM | POA: Insufficient documentation

## 2013-01-11 DIAGNOSIS — Z8669 Personal history of other diseases of the nervous system and sense organs: Secondary | ICD-10-CM | POA: Insufficient documentation

## 2013-01-11 DIAGNOSIS — G8929 Other chronic pain: Secondary | ICD-10-CM | POA: Insufficient documentation

## 2013-01-11 DIAGNOSIS — M129 Arthropathy, unspecified: Secondary | ICD-10-CM | POA: Insufficient documentation

## 2013-01-11 DIAGNOSIS — Z87891 Personal history of nicotine dependence: Secondary | ICD-10-CM | POA: Insufficient documentation

## 2013-01-11 DIAGNOSIS — F3289 Other specified depressive episodes: Secondary | ICD-10-CM | POA: Insufficient documentation

## 2013-01-11 DIAGNOSIS — Z872 Personal history of diseases of the skin and subcutaneous tissue: Secondary | ICD-10-CM | POA: Insufficient documentation

## 2013-01-11 DIAGNOSIS — M549 Dorsalgia, unspecified: Secondary | ICD-10-CM | POA: Insufficient documentation

## 2013-01-11 DIAGNOSIS — R739 Hyperglycemia, unspecified: Secondary | ICD-10-CM

## 2013-01-11 DIAGNOSIS — E119 Type 2 diabetes mellitus without complications: Secondary | ICD-10-CM | POA: Insufficient documentation

## 2013-01-11 DIAGNOSIS — Z8619 Personal history of other infectious and parasitic diseases: Secondary | ICD-10-CM | POA: Insufficient documentation

## 2013-01-11 DIAGNOSIS — R05 Cough: Secondary | ICD-10-CM | POA: Insufficient documentation

## 2013-01-11 DIAGNOSIS — M25569 Pain in unspecified knee: Secondary | ICD-10-CM | POA: Insufficient documentation

## 2013-01-11 DIAGNOSIS — Z85528 Personal history of other malignant neoplasm of kidney: Secondary | ICD-10-CM | POA: Insufficient documentation

## 2013-01-11 DIAGNOSIS — G4730 Sleep apnea, unspecified: Secondary | ICD-10-CM | POA: Insufficient documentation

## 2013-01-11 DIAGNOSIS — F411 Generalized anxiety disorder: Secondary | ICD-10-CM | POA: Insufficient documentation

## 2013-01-11 DIAGNOSIS — M79609 Pain in unspecified limb: Secondary | ICD-10-CM | POA: Insufficient documentation

## 2013-01-11 DIAGNOSIS — I10 Essential (primary) hypertension: Secondary | ICD-10-CM | POA: Insufficient documentation

## 2013-01-11 DIAGNOSIS — F329 Major depressive disorder, single episode, unspecified: Secondary | ICD-10-CM | POA: Insufficient documentation

## 2013-01-11 DIAGNOSIS — M79604 Pain in right leg: Secondary | ICD-10-CM

## 2013-01-11 DIAGNOSIS — Z79899 Other long term (current) drug therapy: Secondary | ICD-10-CM | POA: Insufficient documentation

## 2013-01-11 HISTORY — DX: Pain in unspecified knee: M25.569

## 2013-01-11 HISTORY — DX: Dorsalgia, unspecified: M54.9

## 2013-01-11 HISTORY — DX: Other chronic pain: G89.29

## 2013-01-11 LAB — CBC WITH DIFFERENTIAL/PLATELET
Basophils Absolute: 0 10*3/uL (ref 0.0–0.1)
Eosinophils Absolute: 0.3 10*3/uL (ref 0.0–0.7)
Eosinophils Relative: 5 % (ref 0–5)
Lymphocytes Relative: 24 % (ref 12–46)
Lymphs Abs: 1.7 10*3/uL (ref 0.7–4.0)
MCV: 82.9 fL (ref 78.0–100.0)
Neutrophils Relative %: 63 % (ref 43–77)
Platelets: 270 10*3/uL (ref 150–400)
RBC: 4.28 MIL/uL (ref 3.87–5.11)
RDW: 13.5 % (ref 11.5–15.5)
WBC: 6.9 10*3/uL (ref 4.0–10.5)

## 2013-01-11 LAB — COMPREHENSIVE METABOLIC PANEL
ALT: 9 U/L (ref 0–35)
AST: 13 U/L (ref 0–37)
Alkaline Phosphatase: 133 U/L — ABNORMAL HIGH (ref 39–117)
CO2: 26 mEq/L (ref 19–32)
Calcium: 9.8 mg/dL (ref 8.4–10.5)
Potassium: 3.8 mEq/L (ref 3.5–5.1)
Sodium: 138 mEq/L (ref 135–145)
Total Protein: 8.1 g/dL (ref 6.0–8.3)

## 2013-01-11 LAB — URINALYSIS W MICROSCOPIC + REFLEX CULTURE
Glucose, UA: NEGATIVE mg/dL
Leukocytes, UA: NEGATIVE
Protein, ur: NEGATIVE mg/dL
Specific Gravity, Urine: 1.02 (ref 1.005–1.030)
pH: 6 (ref 5.0–8.0)

## 2013-01-11 MED ORDER — HYDROCODONE-ACETAMINOPHEN 5-325 MG PO TABS: ORAL_TABLET | ORAL | Status: DC

## 2013-01-11 MED ORDER — HYDROCODONE-ACETAMINOPHEN 5-325 MG PO TABS
1.0000 | ORAL_TABLET | Freq: Once | ORAL | Status: AC
  Administered 2013-01-11: 1 via ORAL
  Filled 2013-01-11: qty 1

## 2013-01-11 NOTE — ED Notes (Signed)
Pt tolerates PO water, denies emesis.

## 2013-01-11 NOTE — ED Notes (Signed)
Pt states she takes her Metformin x2 day (morning and night).  Pt states she feels sick and vomits up medication.  Pt knows she is noncompliant with diet stating she eats lots of bread and some sweets.

## 2013-01-11 NOTE — ED Notes (Signed)
Pt c/o hyperglycemia, cough, and leg numbness. All symptoms have been presents "at least a couple weeks". Pt states "I take pills for my diabetes but I don't think they work". Pt states cough in non-productive and reports intermittent wheezing.

## 2013-01-11 NOTE — ED Notes (Deleted)
Pt denies having labs drawn, pt states she wants to be discharged.  MD notified.

## 2013-01-11 NOTE — ED Provider Notes (Signed)
CSN: [id number]     Arrival date & time 01/11/13  1234 History   First MD Initiated Contact with Patient 01/11/13 1422     Chief Complaint  Patient presents with  . Hyperglycemia    3-4 weeks.  400 this am  . Cough    3 weeks  . Leg Pain    3 weeks    HPI Pt was seen at 1445. Per pt, c/o gradual onset and persistence of constant multiple symptoms for the past several months to years. Symptoms include: "high blood sugars" approx 200-400's, cough, bilat LE's "cramping" pain. Pt does admit to not following her DM diet and "eating a lot of sweets and bread" daily. Pt states she has an appt with her PMD on Tuesday (in 3 days), but came to the ED for a "pain medicine prescription until then." Denies any change in her chronic symptoms. Denies CP/palpitations, no SOB/cough, no abd pain, no N/V/D, no falls, no injury, no joints pain, no fevers, no rash, no tingling/numbness in extremities, no focal motor weakness, no saddle anesthesia, no incont/retention of bowel or bladder.    Past Medical History  Diagnosis Date  . Depression   . Brain tumor     Surgery for benign brain tumor in the remote past  . Hypertension   . Preop cardiovascular exam     Cardiac clearance for knee surgery, January, 2013  . Edema   . Tobacco abuse   . Ejection fraction     EF 70%, echo, February, 2013  . Sleep apnea     uses Cpap  . Hepatitis 1970's    hep c  . 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 Anxiety   . Arthritis   . 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 Nasal congestion   . Diabetes mellitus without complication   . Insomnia   . 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 Spinal cord stimulator status     for pain   . Chronic knee pain   . Chronic back pain     with radiculopathy bilat legs   Past Surgical History  Procedure Laterality Date  . Abdominal hysterectomy    . Knee surgery    . Nose surgery    . Back surgery      pt has stimulator in lower back and  can't have an MRI  . Total knee arthroplasty  07/21/2011    Procedure: TOTAL KNEE ARTHROPLASTY;  Surgeon: Thera Flake., MD;  Location: [facility] OR;  Service: Orthopedics;  Laterality: Left;  . 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 Cholecystectomy N/A 09/30/2012    Procedure: LAPAROSCOPIC CHOLECYSTECTOMY;  Surgeon: Fabio Bering, MD;  Location: [facility];  Service: General;  Laterality: N/A;   Family History  Problem Relation Age of Onset  . Cancer Mother   . Hypertension Mother   . Anesthesia problems Neg Hx    History  Substance Use Topics  . Smoking status: Former Smoker -- 0.50 packs/day for 10 years    Types: Cigarettes    Quit date: 02/19/2012  . Smokeless tobacco: Never Used  . Alcohol Use: No     Comment: none since 03-28-2011   OB History   Grav Para Term Preterm Abortions TAB SAB Ect Mult Living   1 1 1       1      Review  of Systems ROS: Statement: All systems negative except as marked or noted in the HPI; Constitutional: Negative for fever and chills. +"high blood sugars."; ; Eyes: Negative for eye pain, redness and discharge. ; ; ENMT: Negative for ear pain, hoarseness, nasal congestion, sinus pressure and sore throat. ; ; Cardiovascular: Negative for chest pain, palpitations, diaphoresis, dyspnea and peripheral edema. ; ; Respiratory: +cough. Negative for wheezing and stridor. ; ; Gastrointestinal: Negative for nausea, vomiting, diarrhea, abdominal pain, blood in stool, hematemesis, jaundice and rectal bleeding. . ; ; Genitourinary: Negative for dysuria, flank pain and hematuria. ; ; Musculoskeletal: +legs pain. Negative for neck pain. Negative for swelling and trauma.; ; Skin: Negative for pruritus, rash, abrasions, blisters, bruising and skin lesion.; ; Neuro: Negative for headache, lightheadedness and neck stiffness. Negative for weakness, altered level of consciousness , altered mental status, extremity weakness, paresthesias, involuntary movement, seizure and syncope.        Allergies  Review of patient's allergies indicates no known allergies.  Home Medications   Current Outpatient Rx  Name  Route  Sig  Dispense  Refill  . amLODipine (NORVASC) 10 MG tablet   Oral   Take 10 mg by mouth daily.         Marland Kitchen Apoaequorin (PREVAGEN PO)   Oral   Take 1 tablet by mouth daily.         . chlorproMAZINE (THORAZINE) 50 MG tablet   Oral   Take 50 mg by mouth at bedtime.          . Coenzyme Q10 (CO Q 10 PO)   Oral   Take 1 tablet by mouth 2 (two) times daily.         Marland Kitchen esomeprazole (NEXIUM) 40 MG capsule   Oral   Take 40 mg by mouth daily as needed (for acid reflux).          Marland Kitchen lisinopril (PRINIVIL,ZESTRIL) 10 MG tablet   Oral   Take 10 mg by mouth daily.         . metFORMIN (GLUCOPHAGE) 500 MG tablet   Oral   Take 1 tablet (500 mg total) by mouth 2 (two) times daily with a meal.   60 tablet   0    BP 144/83  Pulse 88  Temp(Src) 98.1 F (36.7 C) (Oral)  Resp 18  Ht 5\' 1"  (1.549 m)  Wt 175 lb (79.379 kg)  BMI 33.08 kg/m2  SpO2 97% Physical Exam 1450: Physical examination:  Nursing notes reviewed; Vital signs and O2 SAT reviewed;  Constitutional: Well developed, Well nourished, Well hydrated, In no acute distress; Head:  Normocephalic, atraumatic; Eyes: EOMI, PERRL, No scleral icterus; ENMT: Mouth and pharynx normal, Mucous membranes moist; Neck: Supple, Full range of motion, No lymphadenopathy; Cardiovascular: Regular rate and rhythm, No gallop; Respiratory: Breath sounds clear & equal bilaterally, No rales, rhonchi, wheezes.  Speaking full sentences with ease, Normal respiratory effort/excursion; Chest: Nontender, Movement normal; Abdomen: Soft, Nontender, Nondistended, Normal bowel sounds; Genitourinary: No CVA tenderness; Spine:  No midline CS, TS, LS tenderness.;; Extremities: Pulses normal, Strong pedal pulses bilat. Muscles compartments soft bilat LE's. No specific area of point tenderness bilat LE's. No rash, no ecchymosis, no  edema, no deformity, no LE's joints pain.  No calf edema or asymmetry.; Neuro: AA&Ox3, Major CN grossly intact.  Speech clear. Climbs on and off stretcher easily by herself. Gait steady.  No gross focal motor or sensory deficits in extremities.; Skin: Color normal, Warm, Dry.   ED Course  Procedures   EKG Interpretation   None       MDM  MDM Reviewed: previous chart, nursing note and vitals Reviewed previous: labs Interpretation: labs and x-ray     Results for orders placed during the hospital encounter of 01/11/13  GLUCOSE, CAPILLARY      Result Value Range   Glucose-Capillary 345 (*) 70 - 99 mg/dL  URINALYSIS W MICROSCOPIC + REFLEX CULTURE      Result Value Range   Color, Urine YELLOW  YELLOW   APPearance CLEAR  CLEAR   Specific Gravity, Urine 1.020  1.005 - 1.030   pH 6.0  5.0 - 8.0   Glucose, UA NEGATIVE  NEGATIVE mg/dL   Hgb urine dipstick TRACE (*) NEGATIVE   Bilirubin Urine NEGATIVE  NEGATIVE   Ketones, ur NEGATIVE  NEGATIVE mg/dL   Protein, ur NEGATIVE  NEGATIVE mg/dL   Urobilinogen, UA 0.2  0.0 - 1.0 mg/dL   Nitrite NEGATIVE  NEGATIVE   Leukocytes, UA NEGATIVE  NEGATIVE   WBC, UA 0-2  <3 WBC/hpf   RBC / HPF 0-2  <3 RBC/hpf   Squamous Epithelial / LPF RARE  RARE  CBC WITH DIFFERENTIAL      Result Value Range   WBC 6.9  4.0 - 10.5 K/uL   RBC 4.28  3.87 - 5.11 MIL/uL   Hemoglobin 11.6 (*) 12.0 - 15.0 g/dL   HCT 16.1 (*) 09.6 - 04.5 %   MCV 82.9  78.0 - 100.0 fL   MCH 27.1  26.0 - 34.0 pg   MCHC 32.7  30.0 - 36.0 g/dL   RDW 40.9  81.1 - 91.4 %   Platelets 270  150 - 400 K/uL   Neutrophils Relative % 63  43 - 77 %   Neutro Abs 4.4  1.7 - 7.7 K/uL   Lymphocytes Relative 24  12 - 46 %   Lymphs Abs 1.7  0.7 - 4.0 K/uL   Monocytes Relative 8  3 - 12 %   Monocytes Absolute 0.5  0.1 - 1.0 K/uL   Eosinophils Relative 5  0 - 5 %   Eosinophils Absolute 0.3  0.0 - 0.7 K/uL   Basophils Relative 0  0 - 1 %   Basophils Absolute 0.0  0.0 - 0.1 K/uL  COMPREHENSIVE  METABOLIC PANEL      Result Value Range   Sodium 138  135 - 145 mEq/L   Potassium 3.8  3.5 - 5.1 mEq/L   Chloride 99  96 - 112 mEq/L   CO2 26  19 - 32 mEq/L   Glucose, Bld 264 (*) 70 - 99 mg/dL   BUN 11  6 - 23 mg/dL   Creatinine, Ser 7.82  0.50 - 1.10 mg/dL   Calcium 9.8  8.4 - 95.6 mg/dL   Total Protein 8.1  6.0 - 8.3 g/dL   Albumin 3.8  3.5 - 5.2 g/dL   AST 13  0 - 37 U/L   ALT 9  0 - 35 U/L   Alkaline Phosphatase 133 (*) 39 - 117 U/L   Total Bilirubin 0.1 (*) 0.3 - 1.2 mg/dL   GFR calc non Af Amer 56 (*) >90 mL/min   GFR calc Af Amer 64 (*) >90 mL/min  LIPASE, BLOOD      Result Value Range   Lipase 36  11 - 59 U/L   Dg Chest 2 View 01/11/2013   CLINICAL DATA:  Shortness of breath, weakness and cough.  EXAM: CHEST  2 VIEW  COMPARISON:  PA and lateral chest 06/30/2011.  FINDINGS: Heart size is normal. Mild basilar atelectasis is noted. No pneumothorax or pleural fluid. No consolidative process.  IMPRESSION: No acute disease. Stable compared to prior exam.   Electronically Signed   By: Drusilla Kanner M.D.   On: 01/11/2013 15:46    1730:  Pt hyperglycemic but not acidotic with AG 13.  Pt has tol PO well while in the ED without N/V.  No stooling while in the ED.  Abd remains benign, VSS. Legs with non-specific generalized "cramping pain," doubt DVT. Denies any worsening back pain from her chronic pain; neuro exam remains intact. Feels better and wants to go home now. Pt has climbed off the stretcher, gotten herself dressed and calling for her ride home. Requesting a rx of "some pain meds to get me through until Tuesday" when she has a PMD appt. Explained I will rx a very limited amount of pain meds only. Pt verb understanding and wants to leave now. Dx and testing d/w pt.  Questions answered.  Verb understanding, agreeable to d/c home with outpt f/u.    Laray Anger, DO 01/14/13 1028

## 2013-01-11 NOTE — ED Notes (Signed)
Pt reports bs high for last 3-4 weeks. Was 400 at home today. Reports being thirsty and voiding more freq. For several weeks.   Cough for several weeks, no fever, cough is worse after eating.  bil lower ext. Pain for 3-4 weeks, "they feel like they are not there sometimes".

## 2013-01-27 ENCOUNTER — Encounter: Payer: Self-pay | Admitting: Neurology

## 2013-01-27 ENCOUNTER — Ambulatory Visit (INDEPENDENT_AMBULATORY_CARE_PROVIDER_SITE_OTHER): Payer: Medicare Other | Admitting: Neurology

## 2013-01-27 VITALS — BP 135/72 | HR 95 | Temp 98.3°F | Ht 61.5 in | Wt 174.0 lb

## 2013-01-27 DIAGNOSIS — E1165 Type 2 diabetes mellitus with hyperglycemia: Secondary | ICD-10-CM

## 2013-01-27 DIAGNOSIS — G4700 Insomnia, unspecified: Secondary | ICD-10-CM

## 2013-01-27 DIAGNOSIS — R32 Unspecified urinary incontinence: Secondary | ICD-10-CM

## 2013-01-27 DIAGNOSIS — R351 Nocturia: Secondary | ICD-10-CM

## 2013-01-27 DIAGNOSIS — R400 Somnolence: Secondary | ICD-10-CM

## 2013-01-27 DIAGNOSIS — G4733 Obstructive sleep apnea (adult) (pediatric): Secondary | ICD-10-CM

## 2013-01-27 DIAGNOSIS — G4710 Hypersomnia, unspecified: Secondary | ICD-10-CM

## 2013-01-27 DIAGNOSIS — E669 Obesity, unspecified: Secondary | ICD-10-CM
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
## 2013-01-27 NOTE — Patient Instructions (Signed)
Based on your symptoms and your exam I believe you are at risk for obstructive sleep apnea or OSA, and I think we should proceed with a sleep study to determine whether you do or do not have OSA and how severe it is. If you have more than mild OSA, I want you to consider treatment with CPAP. Please remember, the risks and ramifications of moderate to severe obstructive sleep apnea or OSA are: Cardiovascular disease, including congestive heart failure, stroke, difficult to control hypertension, arrhythmias, and even type 2 diabetes has been linked to untreated OSA. Sleep apnea causes disruption of sleep and sleep deprivation in most cases, which, in turn, can cause recurrent headaches, problems with memory, mood, concentration, focus, and vigilance. Most people with untreated sleep apnea report excessive daytime sleepiness, which can affect their ability to drive. Please do not drive if you feel sleepy.  I will see you back after your sleep study to go over the test results and where to go from there. We will call you after your sleep study and to set up an appointment at the time.  Please remember to try to maintain good sleep hygiene, which means: Keep a regular sleep and wake schedule, try not to exercise or have a meal within 2 hours of your bedtime, try to keep your bedroom conducive for sleep, that is, cool and dark, without light distractors such as an illuminated alarm clock, and refrain from watching TV right before sleep or in the middle of the night and do not keep the TV or radio on during the night. Also, try not to use or play on electronic devices at bedtime, such as your cell phone, tablet PC or laptop. If you like to read at bedtime on an electronic device, try to dim the background light as much as possible. Do not eat in the middle of the night.   Please ask Dr. Sherryll Burger about seeing another psychiatry.

## 2013-01-28 DIAGNOSIS — E669 Obesity, unspecified: Secondary | ICD-10-CM | POA: Diagnosis present

## 2013-01-28 DIAGNOSIS — B1920 Unspecified viral hepatitis C without hepatic coma: Secondary | ICD-10-CM | POA: Diagnosis present

## 2013-01-28 DIAGNOSIS — Z6833 Body mass index (BMI) 33.0-33.9, adult: Secondary | ICD-10-CM

## 2013-01-28 DIAGNOSIS — G4710 Hypersomnia, unspecified: Secondary | ICD-10-CM | POA: Diagnosis present

## 2013-01-28 DIAGNOSIS — G8929 Other chronic pain: Secondary | ICD-10-CM | POA: Diagnosis present

## 2013-01-28 DIAGNOSIS — R29810 Facial weakness: Secondary | ICD-10-CM | POA: Diagnosis present

## 2013-01-28 DIAGNOSIS — Z87891 Personal history of nicotine dependence: Secondary | ICD-10-CM

## 2013-01-28 DIAGNOSIS — F329 Major depressive disorder, single episode, unspecified: Secondary | ICD-10-CM | POA: Diagnosis present

## 2013-01-28 DIAGNOSIS — Z833 Family history of diabetes mellitus: Secondary | ICD-10-CM

## 2013-01-28 DIAGNOSIS — G9340 Encephalopathy, unspecified: Principal | ICD-10-CM | POA: Diagnosis present

## 2013-01-28 DIAGNOSIS — I10 Essential (primary) hypertension: Secondary | ICD-10-CM | POA: Diagnosis present

## 2013-01-28 DIAGNOSIS — G4700 Insomnia, unspecified: Secondary | ICD-10-CM | POA: Diagnosis present

## 2013-01-28 DIAGNOSIS — Z96659 Presence of unspecified artificial knee joint: Secondary | ICD-10-CM

## 2013-01-28 DIAGNOSIS — F411 Generalized anxiety disorder: Secondary | ICD-10-CM | POA: Diagnosis present

## 2013-01-28 DIAGNOSIS — Z8249 Family history of ischemic heart disease and other diseases of the circulatory system: Secondary | ICD-10-CM

## 2013-01-28 DIAGNOSIS — G4733 Obstructive sleep apnea (adult) (pediatric): Secondary | ICD-10-CM | POA: Diagnosis present

## 2013-01-28 DIAGNOSIS — M129 Arthropathy, unspecified: Secondary | ICD-10-CM | POA: Diagnosis present

## 2013-01-28 DIAGNOSIS — IMO0001 Reserved for inherently not codable concepts without codable children: Secondary | ICD-10-CM | POA: Diagnosis present

## 2013-01-28 DIAGNOSIS — F3289 Other specified depressive episodes: Secondary | ICD-10-CM | POA: Diagnosis present

## 2013-01-29 ENCOUNTER — Emergency Department (HOSPITAL_COMMUNITY): Payer: Medicare Other

## 2013-01-29 ENCOUNTER — Encounter (HOSPITAL_COMMUNITY): Payer: Self-pay | Admitting: Emergency Medicine

## 2013-01-29 ENCOUNTER — Inpatient Hospital Stay (HOSPITAL_COMMUNITY): Payer: Medicare Other

## 2013-01-29 ENCOUNTER — Inpatient Hospital Stay (HOSPITAL_COMMUNITY)
Admission: EM | Admit: 2013-01-29 | Discharge: 2013-01-31 | DRG: 072 | Disposition: A | Discharge status: Home Health | Payer: Medicare Other | Attending: Internal Medicine | Admitting: Internal Medicine

## 2013-01-29 ENCOUNTER — Inpatient Hospital Stay (HOSPITAL_COMMUNITY)
Admit: 2013-01-29 | Discharge: 2013-01-29 | Disposition: A | Discharge status: Home / Self Care | Payer: Medicare Other | Attending: Neurology | Admitting: Neurology

## 2013-01-29 DIAGNOSIS — R739 Hyperglycemia, unspecified: Secondary | ICD-10-CM

## 2013-01-29 DIAGNOSIS — G4733 Obstructive sleep apnea (adult) (pediatric): Secondary | ICD-10-CM

## 2013-01-29 DIAGNOSIS — G9340 Encephalopathy, unspecified: Secondary | ICD-10-CM

## 2013-01-29 DIAGNOSIS — R7309 Other abnormal glucose: Secondary | ICD-10-CM

## 2013-01-29 DIAGNOSIS — I6350 Cerebral infarction due to unspecified occlusion or stenosis of unspecified cerebral artery: Secondary | ICD-10-CM

## 2013-01-29 DIAGNOSIS — E669 Obesity, unspecified: Secondary | ICD-10-CM | POA: Diagnosis present

## 2013-01-29 DIAGNOSIS — I639 Cerebral infarction, unspecified: Secondary | ICD-10-CM

## 2013-01-29 DIAGNOSIS — IMO0001 Reserved for inherently not codable concepts without codable children: Secondary | ICD-10-CM

## 2013-01-29 DIAGNOSIS — IMO0002 Reserved for concepts with insufficient information to code with codable children: Secondary | ICD-10-CM

## 2013-01-29 DIAGNOSIS — E1165 Type 2 diabetes mellitus with hyperglycemia: Secondary | ICD-10-CM | POA: Diagnosis present

## 2013-01-29 DIAGNOSIS — I10 Essential (primary) hypertension: Secondary | ICD-10-CM

## 2013-01-29 DIAGNOSIS — G4700 Insomnia, unspecified: Secondary | ICD-10-CM

## 2013-01-29 LAB — GLUCOSE, CAPILLARY
Glucose-Capillary: 299 mg/dL — ABNORMAL HIGH (ref 70–99)
Glucose-Capillary: 325 mg/dL — ABNORMAL HIGH (ref 70–99)
Glucose-Capillary: 232 mg/dL — ABNORMAL HIGH (ref 70–99)
Glucose-Capillary: 213 mg/dL — ABNORMAL HIGH (ref 70–99)

## 2013-01-29 LAB — URINE MICROSCOPIC-ADD ON

## 2013-01-29 LAB — COMPREHENSIVE METABOLIC PANEL
ALT: 12 U/L (ref 0–35)
AST: 16 U/L (ref 0–37)
CO2: 27 mEq/L (ref 19–32)
Calcium: 9.7 mg/dL (ref 8.4–10.5)
Creatinine, Ser: 0.96 mg/dL (ref 0.50–1.10)
GFR calc Af Amer: 71 mL/min — ABNORMAL LOW (ref >90)
GFR calc non Af Amer: 61 mL/min — ABNORMAL LOW (ref >90)
Potassium: 4.1 mEq/L (ref 3.5–5.1)
Sodium: 135 mEq/L (ref 135–145)

## 2013-01-29 LAB — POCT I-STAT, CHEM 8
Calcium, Ion: 1.18 mmol/L (ref 1.13–1.30)
Creatinine, Ser: 1 mg/dL (ref 0.50–1.10)
Hemoglobin: 13.3 g/dL (ref 12.0–15.0)
Sodium: 140 mEq/L (ref 135–145)
TCO2: 26 mmol/L (ref 0–100)

## 2013-01-29 LAB — CBC WITH DIFFERENTIAL/PLATELET
Basophils Absolute: 0.1 10*3/uL (ref 0.0–0.1)
Eosinophils Absolute: 0.6 10*3/uL (ref 0.0–0.7)
Eosinophils Relative: 6 % — ABNORMAL HIGH (ref 0–5)
HCT: 37.1 % (ref 36.0–46.0)
Lymphocytes Relative: 16 % (ref 12–46)
MCH: 26.9 pg (ref 26.0–34.0)
MCHC: 32.3 g/dL (ref 30.0–36.0)
MCV: 83.2 fL (ref 78.0–100.0)
Monocytes Absolute: 0.8 10*3/uL (ref 0.1–1.0)
Neutrophils Relative %: 68 % (ref 43–77)
RBC: 4.46 MIL/uL (ref 3.87–5.11)
RDW: 14.1 % (ref 11.5–15.5)

## 2013-01-29 LAB — BLOOD GAS, ARTERIAL
Acid-Base Excess: 0.9 mmol/L (ref 0.0–2.0)
Patient temperature: 37
TCO2: 23.2 mmol/L (ref 0–100)
pCO2 arterial: 43.6 mmHg (ref 35.0–45.0)

## 2013-01-29 LAB — LIPID PANEL
Cholesterol: 154 mg/dL (ref 0–200)
LDL Cholesterol: 67 mg/dL (ref 0–99)
Total CHOL/HDL Ratio: 2.6 RATIO
VLDL: 28 mg/dL (ref 0–40)

## 2013-01-29 LAB — URINALYSIS, ROUTINE W REFLEX MICROSCOPIC
Leukocytes, UA: NEGATIVE
Nitrite: NEGATIVE
Specific Gravity, Urine: 1.02 (ref 1.005–1.030)
Urobilinogen, UA: 0.2 mg/dL (ref 0.0–1.0)

## 2013-01-29 LAB — RAPID URINE DRUG SCREEN, HOSP PERFORMED
Barbiturates: NOT DETECTED
Benzodiazepines: POSITIVE — AB
Opiates: NOT DETECTED
Tetrahydrocannabinol: NOT DETECTED

## 2013-01-29 LAB — VITAMIN B12: Vitamin B-12: 1680 pg/mL — ABNORMAL HIGH (ref 211–911)

## 2013-01-29 LAB — HEMOGLOBIN A1C
Hgb A1c MFr Bld: 11.4 % — ABNORMAL HIGH (ref <5.7)
Mean Plasma Glucose: 280 mg/dL — ABNORMAL HIGH (ref <117)

## 2013-01-29 LAB — PROTIME-INR
INR: 1.09 (ref 0.00–1.49)
Prothrombin Time: 13.9 seconds (ref 11.6–15.2)

## 2013-01-29 LAB — POCT I-STAT TROPONIN I

## 2013-01-29 LAB — RPR: RPR Ser Ql: NONREACTIVE

## 2013-01-29 MED ORDER — BISACODYL 10 MG RE SUPP: 10.0000 mg | Freq: Every day | RECTAL | Status: DC | PRN

## 2013-01-29 MED ORDER — FLEET ENEMA 7-19 GM/118ML RE ENEM: 1.0000 | ENEMA | Freq: Every day | RECTAL | Status: DC | PRN

## 2013-01-29 MED ORDER — ENOXAPARIN SODIUM 40 MG/0.4ML ~~LOC~~ SOLN
40.0000 mg | SUBCUTANEOUS | Status: DC
  Administered 2013-01-29 – 2013-01-31 (×3): 40 mg via SUBCUTANEOUS
  Filled 2013-01-29 (×3): qty 0.4

## 2013-01-29 MED ORDER — ACETAMINOPHEN 650 MG RE SUPP: 650.0000 mg | RECTAL | Status: DC | PRN

## 2013-01-29 MED ORDER — ONDANSETRON HCL 4 MG/2ML IJ SOLN: 4.0000 mg | INTRAMUSCULAR | Status: DC | PRN

## 2013-01-29 MED ORDER — SODIUM CHLORIDE 0.9 % IV BOLUS (SEPSIS)
1000.0000 mL | Freq: Once | INTRAVENOUS | Status: AC
  Administered 2013-01-29: 1000 mL via INTRAVENOUS

## 2013-01-29 MED ORDER — ONDANSETRON HCL 4 MG/2ML IJ SOLN: 4.0000 mg | Freq: Three times a day (TID) | INTRAMUSCULAR | Status: DC | PRN

## 2013-01-29 MED ORDER — INSULIN ASPART 100 UNIT/ML ~~LOC~~ SOLN
0.0000 [IU] | SUBCUTANEOUS | Status: DC
  Administered 2013-01-29: 3 [IU] via SUBCUTANEOUS
  Administered 2013-01-29: 5 [IU] via SUBCUTANEOUS
  Administered 2013-01-29 (×2): 3 [IU] via SUBCUTANEOUS
  Administered 2013-01-29 – 2013-01-30 (×2): 7 [IU] via SUBCUTANEOUS
  Administered 2013-01-30 (×2): 3 [IU] via SUBCUTANEOUS
  Administered 2013-01-30: 2 [IU] via SUBCUTANEOUS
  Administered 2013-01-30: 1 [IU] via SUBCUTANEOUS
  Administered 2013-01-31 (×2): 2 [IU] via SUBCUTANEOUS
  Administered 2013-01-31: 7 [IU] via SUBCUTANEOUS
  Administered 2013-01-31: 3 [IU] via SUBCUTANEOUS

## 2013-01-29 MED ORDER — ASPIRIN 300 MG RE SUPP
300.0000 mg | Freq: Every day | RECTAL | Status: DC
  Administered 2013-01-29: 300 mg via RECTAL
  Filled 2013-01-29 (×3): qty 1

## 2013-01-29 MED ORDER — SODIUM CHLORIDE 0.9 % IV SOLN
INTRAVENOUS | Status: DC
  Administered 2013-01-29: 3.4 [IU]/h via INTRAVENOUS
  Filled 2013-01-29: qty 1

## 2013-01-29 MED ORDER — ASPIRIN 325 MG PO TABS: 325.0000 mg | ORAL_TABLET | Freq: Every day | ORAL | Status: DC

## 2013-01-29 MED ORDER — SODIUM CHLORIDE 0.9 % IV SOLN
INTRAVENOUS | Status: DC
  Administered 2013-01-29: 1000 mL via INTRAVENOUS
  Administered 2013-01-29 (×2): via INTRAVENOUS

## 2013-01-29 MED ORDER — CHLORPROMAZINE HCL 100 MG PO TABS
100.0000 mg | ORAL_TABLET | ORAL | Status: AC
  Administered 2013-01-29: 100 mg via ORAL
  Filled 2013-01-29: qty 1

## 2013-01-29 MED ORDER — ASPIRIN 81 MG PO CHEW
324.0000 mg | CHEWABLE_TABLET | Freq: Once | ORAL | Status: AC
  Administered 2013-01-29: 324 mg via ORAL
  Filled 2013-01-29: qty 4

## 2013-01-29 NOTE — ED Notes (Signed)
Pt c/o increased blood sugar and weakness intermittently over last couple of weeks.

## 2013-01-29 NOTE — Plan of Care (Signed)
Pt successfully fed herself and at a cup of ice and [id number].  She's alert and having a conversation and commenting on TV show. Pt stated that this is usually the time of day she begins to be more awake.

## 2013-01-29 NOTE — Progress Notes (Signed)
Nutrition Brief Note  Patient identified on the Malnutrition Screening Tool (MST) Report  Pt presented to ED with difficulty ambulating and speech is slurred. ST evaluation pending. Weight hx is stable.  Wt Readings from Last 15 Encounters:  01/29/13 173 lb 12.8 oz (78.835 kg)  01/27/13 174 lb (78.926 kg)  01/11/13 175 lb (79.379 kg)  09/30/12 166 lb (75.297 kg)  09/30/12 166 lb (75.297 kg)  09/17/12 166 lb (75.297 kg)  06/08/12 160 lb (72.576 kg)  05/21/12 166 lb (75.297 kg)  09/21/11 163 lb (73.936 kg)  09/09/11 163 lb (73.936 kg)  07/21/11 174 lb 2.6 oz (79 kg)  07/21/11 174 lb 2.6 oz (79 kg)  07/19/11 174 lb 3.2 oz (79.017 kg)  07/01/11 166 lb 6.4 oz (75.479 kg)  04/26/11 172 lb (78.019 kg)    Body mass index is 32.86 kg/(m^2). Patient meets criteria for obesity class I based on current BMI.   Current diet order is NPO for swallow evaluation. Labs and medications reviewed.   No nutrition interventions warranted at this time. If nutrition issues arise, please consult RD.   Royann Shivers MS,RD,CSG,LDN Office: [phone] Pager: [phone]

## 2013-01-29 NOTE — ED Notes (Signed)
Family member states the last time pt was seen normal was around 1800 yesterday.

## 2013-01-29 NOTE — Progress Notes (Signed)
UR chart review completed.  

## 2013-01-29 NOTE — Progress Notes (Signed)
Inpatient Diabetes Program Recommendations  AACE/ADA: New Consensus Statement on Inpatient Glycemic Control (2013)  Target Ranges:  Prepandial:   less than 140 mg/dL      Peak postprandial:   less than 180 mg/dL (1-2 hours)      Critically ill patients:  140 - 180 mg/dL  Results for Rebecca Richards, Rebecca Richards (MRN [id number]) as of 01/29/2013 09:21  Ref. Range 01/29/2013 00:23 01/29/2013 00:59  Glucose Latest Range: 70-99 mg/dL 409 (H) 811 (H)   Results for Rebecca Richards, Rebecca Richards (MRN [id number]) as of 01/29/2013 09:21  Ref. Range 01/29/2013 00:10 01/29/2013 01:26 01/29/2013 02:39 01/29/2013 05:33 01/29/2013 07:32  Glucose-Capillary Latest Range: 70-99 mg/dL 213 (H) 086 (H) 578 (H) 325 (H) 232 (H)    Inpatient Diabetes Program Recommendations Insulin - Basal: Please consider ordering low dose basal insuliin; recommend starting with Levemir 10 untis daily. Correction (SSI): Please consider increasing Novolog correction to moderate correction scale.  Note: Patient has a history of diabetes and takes Januvia 100 mg daily as an outpatient for diabetes management.  In reviewing the chart, it appears that patient was diagnosed with diabetes in February of this year.  There is no A1C in the chart; however, A1C has been ordered and is in process.  Initially patient was started on IV insulin via Glucostabilzer at 1:29 am in the ED but it was stopped shortly after being started as end time was 2:55 am.  Currently, patient is ordered to receive Novolog 0-9 units Q4H for inpatient glycemic control.  Note patient is NPO for swallow evaluation.  Please consider ordering Levemir 10 units daily and increasing Novolog correction to moderate scale.  Will continue to follow.  Thanks, Orlando Penner, RN, MSN, CCRN Diabetes Coordinator Inpatient Diabetes Program [phone] (Team Pager) [phone] ([facility] office) [phone] [facility] office)

## 2013-01-29 NOTE — Plan of Care (Signed)
Dr. Harriette Bouillon note placed, per family request to possibly get nutritional consult to promote healthy eating and alertness (regulating sleep schedule.)

## 2013-01-29 NOTE — Consult Note (Signed)
[facility] Rebecca Richards A. Rebecca Pilgrim, MD     [email or web address]          Rebecca Richards is an 67 y.o. female.   ASSESSMENT/PLAN: 1. Onset of dysarthria and gait impairment and probable/possible altered mental status. The etiology is not forthcoming at this time. However, the patient thinks that is from significant sleep deprivation. The relatively acute onset it is potentially worrisome for stroke but this has not been borne out by imaging. It is still possible that she could have a small stroke not seen on CT scan. The patient has a stimulator and therefore is not a candidate for MRI. No metabolic derangements have been uncovered. Medication effect seems unlikely. All in all, the patient may be correct. This may be an issue of sleep deprivation and severe hypersomnia in the setting of severe sleep apnea syndrome. The patient will be restarted on Thorazine which has helped in the past for insomnia. She also be placed on positive pressure treatment while hospitalized. She is in the process of getting positive pressure evaluation treatment. This has been arranged through [facility] neurology. Additional workup here will include labs for dementia and also an EEG. Additionally, repeating in the patient's CT scan tomorrow may be beneficial. She has a typical stroke workup pending including echo, carotid Doppler and lipids panel. The patient also has been started on aspirin.    This is a 67 year old black female who has a known history of obstructive sleep apnea syndrome. The patient was found by her family to be ataxic and dysarthric. There also may have been some issues with altered mental status. The patient tells me that she has had these issues before when she had difficulty sleeping. She tells her that she has been off for about 2-3 days. She also has not been using her CPAP machine. The patient has had difficulties with sleep initiation and maintenance recently. She has been on Thorazine 100 mg  which works for sleep consolidation. She appears to have right out of this medication. She has tried trazodone in the past other medications without much benefit. The patient has not used her CPAP machine last 5 months or so because the machine is broken. She was seen at [facility] neurology for evaluation of this. She apparently said to have an study done in a few weeks. She last saw Dr. Andrey Campanile the otolaryngologist in [place] at [facility] for her sleep issues. She reports that she had a sleep study done about 4 years ago. She reports that even though she was using her CPAP machine she still had difficulties with sleep initiation and maintenance. The patient denies any focal neurological symptoms. However, the chart indicates that he patient's son thought she had right-sided weakness and this is one of the primary since she was taken to the hospital. Appears however when she was evaluated by the hospitalist, she had no focal neurological deficit. She denies chest pain, shortness of breath, headaches, blurred vision, diplopia, GI or GU symptoms. She thinks that she is essentially back to baseline although she does report some drowsiness. The review of systems otherwise negative.  GENERAL: This is a pleasant obese lady who is in no acute distress.  HEENT: She has a large stocky neck. Posterior air space is severely crowded. She has a large tongue.  ABDOMEN: soft  EXTREMITIES: No edema   BACK: Unremarkable.  SKIN: Normal by inspection.    MENTAL STATUS:  Patient is sleeping on entering the exam room. She is snoring loudly and is  noted to have witnessed apneas. She is easily arousable with verbal commands. She follows commands well. She is lucid and current on awakening.  CRANIAL NERVES: Pupils are equal, round and reactive to light and accommodation; extra ocular movements are full, there is no significant nystagmus; visual fields are full; upper and lower facial muscles are normal in strength and symmetric,  there is mild flattening of the nasolabial fold on the right side; tongue is midline; uvula is midline; shoulder elevation is normal.  MOTOR: Normal tone, bulk and strength; no pronator drift.  COORDINATION: Left finger to nose is normal, right finger to nose is normal, No rest tremor; no intention tremor; no postural tremor; no bradykinesia.  REFLEXES: Deep tendon reflexes are symmetrical and normal. Plantar responses are flexor bilaterally.   SENSATION: Normal to light touch and temperature.  GAIT: She is able to stand with minimal assistance. Gait is slightly wide base.    Past Medical History  Diagnosis Date  . Depression   . Brain tumor     Surgery for benign brain tumor in the remote past  . Hypertension   . Preop cardiovascular exam     Cardiac clearance for knee surgery, January, 2013  . Edema   . Tobacco abuse   . Ejection fraction     EF 70%, echo, February, 2013  . Sleep apnea     uses Cpap  . Hepatitis 1970's    hep c  . 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 Anxiety   . Arthritis   . 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 Nasal congestion   . Diabetes mellitus without complication   . Insomnia   . 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 Spinal cord stimulator status     for pain   . Chronic knee pain   . Chronic back pain     with radiculopathy bilat legs    Past Surgical History  Procedure Laterality Date  . Abdominal hysterectomy    . Knee surgery    . Nose surgery    . Back surgery      pt has stimulator in lower back and can't have an MRI  . Total knee arthroplasty  07/21/2011    Procedure: TOTAL KNEE ARTHROPLASTY;  Surgeon: Thera Flake., MD;  Location: [facility] OR;  Service: Orthopedics;  Laterality: Left;  . 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 Cholecystectomy N/A 09/30/2012    Procedure: LAPAROSCOPIC CHOLECYSTECTOMY;  Surgeon: Fabio Bering, MD;  Location: [facility] ORS;  Service: General;  Laterality: N/A;     Family History  Problem Relation Age of Onset  . Cancer Mother   . Hypertension Mother   . Anesthesia problems Neg Hx   . Diabetes Mother   . Diabetes Brother     Social History:  reports that she quit smoking about a year ago. Her smoking use included Cigarettes. She has a 5 pack-year smoking history. She has never used smokeless tobacco. She reports that she does not drink alcohol or use illicit drugs.  Allergies: No Known Allergies  Medications: Prior to Admission medications   Medication Sig Start Date End Date Taking? Authorizing Provider  amLODipine (NORVASC) 10 MG tablet Take 10 mg by mouth daily.    Historical Provider, MD  Apoaequorin (PREVAGEN PO) Take 1 tablet by mouth daily.    Historical Provider, MD  chlorproMAZINE (  THORAZINE) 50 MG tablet Take 50 mg by mouth at bedtime.  12/25/12   Historical Provider, MD  Coenzyme Q10 (CO Q 10 PO) Take 1 tablet by mouth 2 (two) times daily.    Historical Provider, MD  esomeprazole (NEXIUM) 40 MG capsule Take 40 mg by mouth daily as needed (for acid reflux).     Historical Provider, MD  JANUVIA 100 MG tablet Take 1 tablet by mouth daily. 01/16/13   Historical Provider, MD  lisinopril (PRINIVIL,ZESTRIL) 10 MG tablet Take 10 mg by mouth daily.    Historical Provider, MD  naproxen (NAPROSYN) 500 MG tablet Take 1 tablet by mouth as needed. 11/07/12   Historical Provider, MD     Scheduled Meds: . aspirin  300 mg Rectal Daily  . enoxaparin (LOVENOX) injection  40 mg Subcutaneous Q24H  . insulin aspart  0-9 Units Subcutaneous Q4H   Continuous Infusions: . sodium chloride 1,000 mL (01/29/13 0556)   PRN Meds:.acetaminophen, bisacodyl, ondansetron (ZOFRAN) IV, sodium phosphate   Blood pressure 131/80, pulse 78, temperature 98 F (36.7 C), temperature source Oral, resp. rate 22, height 5\' 1"  (1.549 m), weight 78.835 kg (173 lb 12.8 oz), SpO2 100.00%.   Results for orders placed during the hospital encounter of 01/29/13 (from the past  48 hour(s))  GLUCOSE, CAPILLARY     Status: Abnormal   Collection Time    01/29/13 12:10 AM      Result Value Range   Glucose-Capillary 396 (*) 70 - 99 mg/dL   Comment 1 Documented in Chart     Comment 2 Notify RN    CBC WITH DIFFERENTIAL     Status: Abnormal   Collection Time    01/29/13 12:23 AM      Result Value Range   WBC 9.8  4.0 - 10.5 K/uL   RBC 4.46  3.87 - 5.11 MIL/uL   Hemoglobin 12.0  12.0 - 15.0 g/dL   HCT 65.7  84.6 - 96.2 %   MCV 83.2  78.0 - 100.0 fL   MCH 26.9  26.0 - 34.0 pg   MCHC 32.3  30.0 - 36.0 g/dL   RDW 95.2  84.1 - 32.4 %   Platelets 309  150 - 400 K/uL   Neutrophils Relative % 68  43 - 77 %   Neutro Abs 6.8  1.7 - 7.7 K/uL   Lymphocytes Relative 16  12 - 46 %   Lymphs Abs 1.6  0.7 - 4.0 K/uL   Monocytes Relative 9  3 - 12 %   Monocytes Absolute 0.8  0.1 - 1.0 K/uL   Eosinophils Relative 6 (*) 0 - 5 %   Eosinophils Absolute 0.6  0.0 - 0.7 K/uL   Basophils Relative 1  0 - 1 %   Basophils Absolute 0.1  0.0 - 0.1 K/uL  COMPREHENSIVE METABOLIC PANEL     Status: Abnormal   Collection Time    01/29/13 12:23 AM      Result Value Range   Sodium 135  135 - 145 mEq/L   Potassium 4.1  3.5 - 5.1 mEq/L   Chloride 97  96 - 112 mEq/L   CO2 27  19 - 32 mEq/L   Glucose, Bld 437 (*) 70 - 99 mg/dL   BUN 18  6 - 23 mg/dL   Creatinine, Ser 4.01  0.50 - 1.10 mg/dL   Calcium 9.7  8.4 - 02.7 mg/dL   Total Protein 8.0  6.0 - 8.3 g/dL   Albumin 3.8  3.5 - 5.2 g/dL   AST 16  0 - 37 U/L   ALT 12  0 - 35 U/L   Alkaline Phosphatase 139 (*) 39 - 117 U/L   Total Bilirubin 0.2 (*) 0.3 - 1.2 mg/dL   GFR calc non Af Amer 61 (*) >90 mL/min   GFR calc Af Amer 71 (*) >90 mL/min   Comment: (NOTE)     The eGFR has been calculated using the CKD EPI equation.     This calculation has not been validated in all clinical situations.     eGFR's persistently <90 mL/min signify possible Chronic Kidney     Disease.  ETHANOL     Status: None   Collection Time    01/29/13 12:23 AM       Result Value Range   Alcohol, Ethyl (B) <11  0 - 11 mg/dL   Comment:            LOWEST DETECTABLE LIMIT FOR     SERUM ALCOHOL IS 11 mg/dL     FOR MEDICAL PURPOSES ONLY  PROTIME-INR     Status: None   Collection Time    01/29/13 12:23 AM      Result Value Range   Prothrombin Time 13.9  11.6 - 15.2 seconds   INR 1.09  0.00 - 1.49  APTT     Status: None   Collection Time    01/29/13 12:23 AM      Result Value Range   aPTT 32  24 - 37 seconds  TROPONIN I     Status: None   Collection Time    01/29/13 12:23 AM      Result Value Range   Troponin I <0.30  <0.30 ng/mL   Comment:            Due to the release kinetics of cTnI,     a negative result within the first hours     of the onset of symptoms does not rule out     myocardial infarction with certainty.     If myocardial infarction is still suspected,     repeat the test at appropriate intervals.  POCT I-STAT TROPONIN I     Status: None   Collection Time    01/29/13 12:58 AM      Result Value Range   Troponin i, poc 0.01  0.00 - 0.08 ng/mL   Comment 3            Comment: Due to the release kinetics of cTnI,     a negative result within the first hours     of the onset of symptoms does not rule out     myocardial infarction with certainty.     If myocardial infarction is still suspected,     repeat the test at appropriate intervals.  POCT I-STAT, CHEM 8     Status: Abnormal   Collection Time    01/29/13 12:59 AM      Result Value Range   Sodium 140  135 - 145 mEq/L   Potassium 4.2  3.5 - 5.1 mEq/L   Chloride 102  96 - 112 mEq/L   BUN 18  6 - 23 mg/dL   Creatinine, Ser 9.60  0.50 - 1.10 mg/dL   Glucose, Bld 454 (*) 70 - 99 mg/dL   Calcium, Ion 0.98  1.19 - 1.30 mmol/L   TCO2 26  0 - 100 mmol/L   Hemoglobin 13.3  12.0 - 15.0 g/dL  HCT 39.0  36.0 - 46.0 %  URINE RAPID DRUG SCREEN (HOSP PERFORMED)     Status: Abnormal   Collection Time    01/29/13  1:17 AM      Result Value Range   Opiates NONE DETECTED  NONE  DETECTED   Cocaine NONE DETECTED  NONE DETECTED   Benzodiazepines POSITIVE (*) NONE DETECTED   Amphetamines NONE DETECTED  NONE DETECTED   Tetrahydrocannabinol NONE DETECTED  NONE DETECTED   Barbiturates NONE DETECTED  NONE DETECTED   Comment:            DRUG SCREEN FOR MEDICAL PURPOSES     ONLY.  IF CONFIRMATION IS NEEDED     FOR ANY PURPOSE, NOTIFY LAB     WITHIN 5 DAYS.                LOWEST DETECTABLE LIMITS     FOR URINE DRUG SCREEN     Drug Class       Cutoff (ng/mL)     Amphetamine      1000     Barbiturate      200     Benzodiazepine   200     Tricyclics       300     Opiates          300     Cocaine          300     THC              50  URINALYSIS, ROUTINE W REFLEX MICROSCOPIC     Status: Abnormal   Collection Time    01/29/13  1:17 AM      Result Value Range   Color, Urine YELLOW  YELLOW   APPearance CLEAR  CLEAR   Specific Gravity, Urine 1.020  1.005 - 1.030   pH 6.0  5.0 - 8.0   Glucose, UA >1000 (*) NEGATIVE mg/dL   Hgb urine dipstick TRACE (*) NEGATIVE   Bilirubin Urine NEGATIVE  NEGATIVE   Ketones, ur NEGATIVE  NEGATIVE mg/dL   Protein, ur 30 (*) NEGATIVE mg/dL   Urobilinogen, UA 0.2  0.0 - 1.0 mg/dL   Nitrite NEGATIVE  NEGATIVE   Leukocytes, UA NEGATIVE  NEGATIVE  URINE MICROSCOPIC-ADD ON     Status: None   Collection Time    01/29/13  1:17 AM      Result Value Range   Squamous Epithelial / LPF RARE  RARE   RBC / HPF 0-2  <3 RBC/hpf   Bacteria, UA RARE  RARE  GLUCOSE, CAPILLARY     Status: Abnormal   Collection Time    01/29/13  1:26 AM      Result Value Range   Glucose-Capillary 395 (*) 70 - 99 mg/dL   Comment 1 Documented in Chart     Comment 2 Notify RN    GLUCOSE, CAPILLARY     Status: Abnormal   Collection Time    01/29/13  2:39 AM      Result Value Range   Glucose-Capillary 299 (*) 70 - 99 mg/dL   Comment 1 Documented in Chart    LIPID PANEL     Status: None   Collection Time    01/29/13  5:29 AM      Result Value Range   Cholesterol  154  0 - 200 mg/dL   Triglycerides 191  <478 mg/dL   HDL 59  >29 mg/dL   Total CHOL/HDL Ratio 2.6  VLDL 28  0 - 40 mg/dL   LDL Cholesterol 67  0 - 99 mg/dL   Comment:            Total Cholesterol/HDL:CHD Risk     Coronary Heart Disease Risk Table                         Men   Women      1/2 Average Risk   3.4   3.3      Average Risk       5.0   4.4      2 X Average Risk   9.6   7.1      3 X Average Risk  23.4   11.0                Use the calculated Patient Ratio     above and the CHD Risk Table     to determine the patient's CHD Risk.                ATP III CLASSIFICATION (LDL):      <100     mg/dL   Optimal      161-096  mg/dL   Near or Above                        Optimal      130-159  mg/dL   Borderline      045-409  mg/dL   High      >811     mg/dL   Very High  GLUCOSE, CAPILLARY     Status: Abnormal   Collection Time    01/29/13  5:33 AM      Result Value Range   Glucose-Capillary 325 (*) 70 - 99 mg/dL  GLUCOSE, CAPILLARY     Status: Abnormal   Collection Time    01/29/13  7:32 AM      Result Value Range   Glucose-Capillary 232 (*) 70 - 99 mg/dL   Comment 1 Notify RN      Ct Head Wo Contrast  01/29/2013   CLINICAL DATA:  66-year- female. Acute stroke abnormal mental status. Hyperglycemia and generalized weakness. Last seen normal 6 hr ago. Initial encounter.  EXAM: CT HEAD WITHOUT CONTRAST  TECHNIQUE: Contiguous axial images were obtained from the base of the skull through the vertex without intravenous contrast.  COMPARISON:  Brain MRI 08/2009 comment head CT 10/30/2004.  FINDINGS: Left maxillary sinus mucous retention cyst. Other Visualized paranasal sinuses and mastoids are clear. No acute osseous abnormality identified. Visualized orbits and scalp soft tissues are within normal limits.  Only mildly decreased cerebral volume since 2006. No ventriculomegaly. Chronic bulky dural calcifications in her mainly noted. No midline shift, mass effect, or evidence of  intracranial mass lesion. No acute intracranial hemorrhage identified. Patchy cerebral white matter hypodensities. No evidence of cortically based acute infarction identified. No suspicious intracranial vascular hyperdensity. 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  IMPRESSION: Chronic nonspecific white matter changes. No acute cortically based infarct or acute intracranial hemorrhage.  Study discussed by telephone with Dr. Eber Hong on 01/29/2013 at 00:48 .   Electronically Signed   By: Augusto Gamble M.D.   On: 01/29/2013 00:50   Dg Chest Portable 1 View  01/29/2013   CLINICAL DATA:  Hyperglycemia and weakness.  EXAM: PORTABLE CHEST - 1 VIEW  COMPARISON:  Chest radiograph January 11, 2013  FINDINGS: Cardiomediastinal silhouette is unremarkable for this low inspiratory portable examination with crowded vasculature markings. The lungs are clear without pleural effusions or focal consolidations. Trachea projects midline and there is no pneumothorax. Included soft tissue planes and osseous structures are non-suspicious. Epidural stimulator leads project in mid thoracic spine. Multiple EKG lines overlie the patient and may obscure subtle underlying pathology. Surgical clips in the right abdomen consistent with cholecystectomy.  IMPRESSION: No acute cardiopulmonary process for this low inspiratory portable examination.   Electronically Signed   By: Awilda Metro   On: 01/29/2013 00:48        Ahaana Rochette A. Rebecca Richards, M.D.  Diplomate, Biomedical engineer of Psychiatry and Neurology ( Neurology). 01/29/2013, 8:39 AM

## 2013-01-29 NOTE — ED Notes (Signed)
Family member given several bractlets removed from pt right wrist.

## 2013-01-29 NOTE — ED Notes (Signed)
Pt very sleepy, pt will respond when spoken to, speech slurred, Pt states weakness on & off for the past 2 weeks. EDP in room, pt w/ slight facial droop to right side when asked to smile. Some drift on lower EXT. Pt able to feel touch.

## 2013-01-29 NOTE — H&P (Signed)
[facility] History and Physical  Rebecca Richards  ZOX:[id number]  DOB: Sep 27, 1945   DOA: 01/29/2013   PCP:   Kirstie Peri, MD Neurologist: Dr. Gerilyn Pilgrim  Chief Complaint:  Difficulty walking  HPI: Rebecca Richards is a 67 y.o. female.   Obese Caucasian lady with obstructive sleep apnea, hypertension, and uncontrolled diabetes was last seen without neurological deficits at about 6 PM last evening, was heard stumbling at home after 11 PM was found by her son to have right-sided weakness and brought to the emergency. On arrival at the emergency room at midnight code stroke was called and she was considered not to be a candidate because of the time right facial weakness and hyperglycemia were noted and hospitalist service was called for admission.  Although, according to her son, patient reportedly passed a swallowing study downstairs in the emergency room,  Currently her speech is slurred she is very drowsy and does not cooperate with the history; however she was able to say she got up to use the bathroom, but then the rest of the sentence makes no sense. Her son is unable to understand.  The son reports that over the past 4 months since a diagnosis of diabetes blood sugars been very difficult to control. He reports her sleep study was repeated yesterday in order to get a new part for a CPAP machine.  She's not seeing a psychiatrist recently because her psychiatrist has moved; she does  taketake Thorazine for sleep since she has problems with insomnia. She no longer smokes tobacco. 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 Depression   . Brain tumor     Surgery for benign brain tumor in the remote past  . Hypertension   . Preop cardiovascular exam     Cardiac clearance for knee  surgery, January, 2013  . Edema   . Tobacco abuse   . Ejection fraction     EF 70%, echo, February, 2013  . Sleep apnea     uses Cpap  . Hepatitis 1970's    hep c  . 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 Anxiety   . Arthritis   . 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 Nasal congestion   . Diabetes mellitus without complication   . Insomnia   . 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 Spinal cord stimulator status     for pain   . Chronic knee pain   . Chronic back pain     with radiculopathy bilat legs    Past Surgical History  Procedure Laterality Date  . Abdominal hysterectomy    . Knee surgery    . Nose surgery    . Back surgery      pt has stimulator in lower back and can't have an MRI  . Total knee arthroplasty  07/21/2011    Procedure: TOTAL KNEE ARTHROPLASTY;  Surgeon: Thera Flake., MD;  Location: [facility] OR;  Service: Orthopedics;  Laterality: Left;  . 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 Cholecystectomy N/A 09/30/2012    Procedure: LAPAROSCOPIC CHOLECYSTECTOMY;  Surgeon: Fabio Bering, MD;  Location: [facility];  Service: General;  Laterality:  N/A;    Medications:  HOME MEDS: Prior to Admission medications   Medication Sig Start Date End Date Taking? Authorizing Provider  amLODipine (NORVASC) 10 MG tablet Take 10 mg by mouth daily.    Historical Provider, MD  Apoaequorin (PREVAGEN PO) Take 1 tablet by mouth daily.    Historical Provider, MD  chlorproMAZINE (THORAZINE) 50 MG tablet Take 50 mg by mouth at bedtime.  12/25/12   Historical Provider, MD  Coenzyme Q10 (CO Q 10 PO) Take 1 tablet by mouth 2 (two) times daily.    Historical Provider, MD  esomeprazole (NEXIUM) 40 MG capsule Take 40 mg by mouth daily as needed (for acid reflux).     Historical Provider, MD  JANUVIA 100 MG tablet Take 1 tablet by mouth daily. 01/16/13   Historical Provider, MD  lisinopril (PRINIVIL,ZESTRIL) 10 MG tablet Take 10 mg by mouth daily.     Historical Provider, MD  naproxen (NAPROSYN) 500 MG tablet Take 1 tablet by mouth as needed. 11/07/12   Historical Provider, MD     Allergies:  No Known Allergies  Social History:   reports that she quit smoking about a year ago. Her smoking use included Cigarettes. She has a 5 pack-year smoking history. She has never used smokeless tobacco. She reports that she does not drink alcohol or use illicit drugs.  Family History: Family History  Problem Relation Age of Onset  . Cancer Mother   . Hypertension Mother   . Anesthesia problems Neg Hx      Physical Exam: Filed Vitals:   01/29/13 0100 01/29/13 0122 01/29/13 0124 01/29/13 0201  BP: 131/76 142/91  132/92  Pulse: 94 93  91  Temp:      TempSrc:      Resp:  22  24  Height:   5\' 1"  (1.549 m)   Weight:   79.833 kg (176 lb)   SpO2: 99% 100%  100%   Blood pressure 132/92, pulse 91, temperature 98.4 F (36.9 C), temperature source Oral, resp. rate 24, height 5\' 1"  (1.549 m), weight 79.833 kg (176 lb), SpO2 100.00%. Body mass index is 33.27 kg/(m^2).   GEN:  Lethargic obese African American lady lying bed in no acute distress; unable to be fully cooperative with exam PSYCH:  Unable to assess orientation;  keeps eyes closed. HEENT: Mucous membranes pink and anicteric; right facial weakness  Breasts:: Not examined CHEST WALL: No tenderness CHEST: Normal respiration, clear to auscultation bilaterally HEART: Regular rate and rhythm; no murmurs rubs or gallops ABDOMEN: Obese, soft non-tender; no masses, no organomegaly, normal abdominal bowel sounds;  Rectal Exam: Not done EXTREMITIES:  age-appropriate arthropathy of the hands and knees; status post left knee replacement ;no edema; no ulcerations. Genitalia: not examined PULSES: 2+ and symmetric SKIN: Lichen planus rash about both ankles CNS: Cranial nerves right facial weakness; mild right upper and lower extremity; but moves his extremities to specific provocation; moves right  side less than the left; tendon reflexes diminished right side; Babinski:pronounced withdrawal, right greater than left    Labs on Admission:  Basic Metabolic Panel:  Recent Labs Lab 01/29/13 0023 01/29/13 0059  NA 135 140  K 4.1 4.2  CL 97 102  CO2 27  --   GLUCOSE 437* 442*  BUN 18 18  CREATININE 0.96 1.00  CALCIUM 9.7  --    Liver Function Tests:  Recent Labs Lab 01/29/13 0023  AST 16  ALT 12  ALKPHOS 139*  BILITOT 0.2*  PROT 8.0  ALBUMIN 3.8   No results found for this basename: LIPASE, AMYLASE,  in the last 168 hours No results found for this basename: AMMONIA,  in the last 168 hours CBC:  Recent Labs Lab 01/29/13 0023 01/29/13 0059  WBC 9.8  --   NEUTROABS 6.8  --   HGB 12.0 13.3  HCT 37.1 39.0  MCV 83.2  --   PLT 309  --    Cardiac Enzymes:  Recent Labs Lab 01/29/13 0023  TROPONINI <0.30   BNP: No components found with this basename: POCBNP,  D-dimer: No components found with this basename: D-DIMER,  CBG:  Recent Labs Lab 01/29/13 0010 01/29/13 0126 01/29/13 0239  GLUCAP 396* 395* 299*    Radiological Exams on Admission: Ct Head Wo Contrast  01/29/2013   CLINICAL DATA:  66-year- female. Acute stroke abnormal mental status. Hyperglycemia and generalized weakness. Last seen normal 6 hr ago. Initial encounter.  EXAM: CT HEAD WITHOUT CONTRAST  TECHNIQUE: Contiguous axial images were obtained from the base of the skull through the vertex without intravenous contrast.  COMPARISON:  Brain MRI 08/2009 comment head CT 10/30/2004.  FINDINGS: Left maxillary sinus mucous retention cyst. Other Visualized paranasal sinuses and mastoids are clear. No acute osseous abnormality identified. Visualized orbits and scalp soft tissues are within normal limits.  Only mildly decreased cerebral volume since 2006. No ventriculomegaly. Chronic bulky dural calcifications in her mainly noted. No midline shift, mass effect, or evidence of intracranial mass lesion. No  acute intracranial hemorrhage identified. Patchy cerebral white matter hypodensities. No evidence of cortically based acute infarction identified. No suspicious intracranial vascular hyperdensity. 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  IMPRESSION: Chronic nonspecific white matter changes. No acute cortically based infarct or acute intracranial hemorrhage.  Study discussed by telephone with Dr. Eber Hong on 01/29/2013 at 00:48 .   Electronically Signed   By: Augusto Gamble M.D.   On: 01/29/2013 00:50   Dg Chest Portable 1 View  01/29/2013   CLINICAL DATA:  Hyperglycemia and weakness.  EXAM: PORTABLE CHEST - 1 VIEW  COMPARISON:  Chest radiograph January 11, 2013  FINDINGS: Cardiomediastinal silhouette is unremarkable for this low inspiratory portable examination with crowded vasculature markings. The lungs are clear without pleural effusions or focal consolidations. Trachea projects midline and there is no pneumothorax. Included soft tissue planes and osseous structures are non-suspicious. Epidural stimulator leads project in mid thoracic spine. Multiple EKG lines overlie the patient and may obscure subtle underlying pathology. Surgical clips in the right abdomen consistent with cholecystectomy.  IMPRESSION: No acute cardiopulmonary process for this low inspiratory portable examination.   Electronically Signed   By: Awilda Metro   On: 01/29/2013 00:48     Assessment/Plan   Active Problems:   Hypertension   OSA on CPAP   Stroke   Diabetes mellitus type 2, uncontrolled   Obesity, unspecified  PLAN: Admit on acute stroke protocol IV fluid Keep n.p.o.; swallowing study later today Sliding scale insulin Lipid panel, RPR, homocysteine, 2-D echo and carotid Dopplers; not a candidate for MRI Neurology consult later today Withhold antihypertensives   Other plans as per orders.  Code Status: Full code  Family Communication: Plans  discuss with son at bedside   Cedrik Heindl Nocturnist [facility] Pager [phone]   01/29/2013, 3:38 AM

## 2013-01-29 NOTE — Plan of Care (Signed)
Tested pt with ice and she showed no s/sx of choking or aspirations.  Diet will be advanced to CL per Dr. Kerry Hough verbal orders.

## 2013-01-29 NOTE — ED Provider Notes (Signed)
CSN: [id number]     Arrival date & time 01/28/13  2356 History   First MD Initiated Contact with Patient 01/28/13 2358     Chief Complaint  Patient presents with  . Hyperglycemia  . Weakness   (Consider location/radiation/quality/duration/timing/severity/associated sxs/prior Treatment) HPI Comments: 67 year old female, history of sleep apnea, renal cell carcinoma, hepatitis C, hypertension and diabetes. She lives with her son, he noticed this evening that she was not her normal self when he heard her fall. He states that she was trying to walk from her bed to the bathroom, she was stumbling, she fell to the ground and had altered mental status. While on the ground he noted that she continued to want to try to sleep on the ground, he had difficulty waking her up and she seemed altered. Her speech was abnormal. The patient is able to wake up a lot of to tell me that she feels like her legs have been weak intermittently for several weeks. She states it is both legs, of note the patient's speech is slurred during examination.    Level 5 caveat applies secondary to AMS   Patient is a 67 y.o. female presenting with hyperglycemia and weakness. The history is provided by the patient, a relative and medical records.  Hyperglycemia Weakness    Past Medical History  Diagnosis Date  . Depression   . Brain tumor     Surgery for benign brain tumor in the remote past  . Hypertension   . Preop cardiovascular exam     Cardiac clearance for knee surgery, January, 2013  . Edema   . Tobacco abuse   . Ejection fraction     EF 70%, echo, February, 2013  . Sleep apnea     uses Cpap  . Hepatitis 1970's    hep c  . 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 Anxiety   . Arthritis   . 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 Nasal congestion   . Diabetes mellitus without complication   . Insomnia   . 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 Spinal cord stimulator status      for pain   . Chronic knee pain   . Chronic back pain     with radiculopathy bilat legs   Past Surgical History  Procedure Laterality Date  . Abdominal hysterectomy    . Knee surgery    . Nose surgery    . Back surgery      pt has stimulator in lower back and can't have an MRI  . Total knee arthroplasty  07/21/2011    Procedure: TOTAL KNEE ARTHROPLASTY;  Surgeon: Thera Flake., MD;  Location: [facility] OR;  Service: Orthopedics;  Laterality: Left;  . 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 Cholecystectomy N/A 09/30/2012    Procedure: LAPAROSCOPIC CHOLECYSTECTOMY;  Surgeon: Fabio Bering, MD;  Location: [facility];  Service: General;  Laterality: N/A;   Family History  Problem Relation Age of Onset  . Cancer Mother   . Hypertension Mother   . Anesthesia problems Neg Hx    History  Substance Use Topics  . Smoking status: Former Smoker -- 0.50 packs/day for 10 years    Types: Cigarettes    Quit date: 02/19/2012  . Smokeless tobacco: Never Used  . Alcohol Use: No     Comment: none since 03-28-2011  OB History   Grav Para Term Preterm Abortions TAB SAB Ect Mult Living   1 1 1       1      Review of Systems  Unable to perform ROS: Mental status change  Neurological: Positive for weakness.    Allergies  Review of patient's allergies indicates no known allergies.  Home Medications   Current Outpatient Rx  Name  Route  Sig  Dispense  Refill  . amLODipine (NORVASC) 10 MG tablet   Oral   Take 10 mg by mouth daily.         Marland Kitchen Apoaequorin (PREVAGEN PO)   Oral   Take 1 tablet by mouth daily.         . chlorproMAZINE (THORAZINE) 50 MG tablet   Oral   Take 50 mg by mouth at bedtime.          . Coenzyme Q10 (CO Q 10 PO)   Oral   Take 1 tablet by mouth 2 (two) times daily.         Marland Kitchen esomeprazole (NEXIUM) 40 MG capsule   Oral   Take 40 mg by mouth daily as needed (for acid reflux).          Marland Kitchen JANUVIA 100 MG tablet   Oral   Take 1 tablet by mouth daily.          Marland Kitchen lisinopril (PRINIVIL,ZESTRIL) 10 MG tablet   Oral   Take 10 mg by mouth daily.         . naproxen (NAPROSYN) 500 MG tablet   Oral   Take 1 tablet by mouth as needed.          BP 132/92  Pulse 91  Temp(Src) 98.4 F (36.9 C) (Oral)  Resp 24  Ht 5\' 1"  (1.549 m)  Wt 176 lb (79.833 kg)  BMI 33.27 kg/m2  SpO2 100% Physical Exam  Nursing note and vitals reviewed. Constitutional: She appears well-developed and well-nourished. No distress.  HENT:  Head: Normocephalic and atraumatic.  Mouth/Throat: Oropharynx is clear and moist. No oropharyngeal exudate.  Eyes: Conjunctivae and EOM are normal. Pupils are equal, round, and reactive to light. Right eye exhibits no discharge. Left eye exhibits no discharge. No scleral icterus.  Neck: Normal range of motion. Neck supple. No JVD present. No thyromegaly present.  Cardiovascular: Normal rate, regular rhythm, normal heart sounds and intact distal pulses.  Exam reveals no gallop and no friction rub.   No murmur heard. No carotid bruit  Pulmonary/Chest: Effort normal and breath sounds normal. No respiratory distress. She has no wheezes. She has no rales.  Abdominal: Soft. Bowel sounds are normal. She exhibits no distension and no mass. There is no tenderness.  Musculoskeletal: Normal range of motion. She exhibits no edema and no tenderness.  Lymphadenopathy:    She has no cervical adenopathy.  Neurological: She is alert. Coordination normal.  Somnolent but arousable, slurred speech, right-sided facial droop, strength is equal bilateral upper and lower extremities, her lower extra these are slightly weak bilaterally. She is able to straight leg raise for greater than 5 seconds on both sides. Normal reflexes at the patellar tendons, cranial nerves III through XII are intact bilaterally except for right-sided facial droop  Skin: Skin is warm and dry. No rash noted. No erythema.  Psychiatric: She has a normal mood and affect. Her behavior is  normal.    ED Course  Procedures (including critical care time) Labs Review Labs Reviewed  GLUCOSE, CAPILLARY -  Abnormal; Notable for the following:    Glucose-Capillary 396 (*)    All other components within normal limits  CBC WITH DIFFERENTIAL - Abnormal; Notable for the following:    Eosinophils Relative 6 (*)    All other components within normal limits  COMPREHENSIVE METABOLIC PANEL - Abnormal; Notable for the following:    Glucose, Bld 437 (*)    Alkaline Phosphatase 139 (*)    Total Bilirubin 0.2 (*)    GFR calc non Af Amer 61 (*)    GFR calc Af Amer 71 (*)    All other components within normal limits  URINE RAPID DRUG SCREEN (HOSP PERFORMED) - Abnormal; Notable for the following:    Benzodiazepines POSITIVE (*)    All other components within normal limits  URINALYSIS, ROUTINE W REFLEX MICROSCOPIC - Abnormal; Notable for the following:    Glucose, UA >1000 (*)    Hgb urine dipstick TRACE (*)    Protein, ur 30 (*)    All other components within normal limits  GLUCOSE, CAPILLARY - Abnormal; Notable for the following:    Glucose-Capillary 395 (*)    All other components within normal limits  POCT I-STAT, CHEM 8 - Abnormal; Notable for the following:    Glucose, Bld 442 (*)    All other components within normal limits  ETHANOL  PROTIME-INR  APTT  TROPONIN I  URINE MICROSCOPIC-ADD ON  POCT I-STAT TROPONIN I   Imaging Review Ct Head Wo Contrast  01/29/2013   CLINICAL DATA:  66-year- female. Acute stroke abnormal mental status. Hyperglycemia and generalized weakness. Last seen normal 6 hr ago. Initial encounter.  EXAM: CT HEAD WITHOUT CONTRAST  TECHNIQUE: Contiguous axial images were obtained from the base of the skull through the vertex without intravenous contrast.  COMPARISON:  Brain MRI 08/2009 comment head CT 10/30/2004.  FINDINGS: Left maxillary sinus mucous retention cyst. Other Visualized paranasal sinuses and mastoids are clear. No acute osseous abnormality  identified. Visualized orbits and scalp soft tissues are within normal limits.  Only mildly decreased cerebral volume since 2006. No ventriculomegaly. Chronic bulky dural calcifications in her mainly noted. No midline shift, mass effect, or evidence of intracranial mass lesion. No acute intracranial hemorrhage identified. Patchy cerebral white matter hypodensities. No evidence of cortically based acute infarction identified. No suspicious intracranial vascular hyperdensity. 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  IMPRESSION: Chronic nonspecific white matter changes. No acute cortically based infarct or acute intracranial hemorrhage.  Study discussed by telephone with Dr. Eber Hong on 01/29/2013 at 00:48 .   Electronically Signed   By: Augusto Gamble M.D.   On: 01/29/2013 00:50   Dg Chest Portable 1 View  01/29/2013   CLINICAL DATA:  Hyperglycemia and weakness.  EXAM: PORTABLE CHEST - 1 VIEW  COMPARISON:  Chest radiograph January 11, 2013  FINDINGS: Cardiomediastinal silhouette is unremarkable for this low inspiratory portable examination with crowded vasculature markings. The lungs are clear without pleural effusions or focal consolidations. Trachea projects midline and there is no pneumothorax. Included soft tissue planes and osseous structures are non-suspicious. Epidural stimulator leads project in mid thoracic spine. Multiple EKG lines overlie the patient and may obscure subtle underlying pathology. Surgical clips in the right abdomen consistent with cholecystectomy.  IMPRESSION: No acute cardiopulmonary process for this low inspiratory portable examination.   Electronically Signed   By: Awilda Metro   On: 01/29/2013 00:48    EKG Interpretation     Ventricular  Rate:  98 PR Interval:  134 QRS Duration: 82 QT Interval:  354 QTC Calculation: 451 R Axis:   26 Text Interpretation:  Normal sinus rhythm Normal ECG When compared with ECG  of 25-Sep-2012 10:29, No significant change was found            MDM   1. Stroke   2. Hyperglycemia    The patient has an abnormal neurologic exam in that she does have facial droop, somnolence, recent fall. She was last known well at 6:00 PM approximately 6-1/2 hours prior to evaluation, her son was the last person to see her and states that she was normal at 6:00 PM. Blood sugar is very high, will need further workup for stroke, code stroke activated on arrival, CT scan ordered stat, no atrial fibrillation on exam nor on EKG.  Vital signs reveal that the patient is normotensive.  Dr. Roseanne Reno (12:40 AM)agrees with no TPA given sx and time frame > 6.5 hours since last known well.   Discussed findings of CT scan with radiologist who states that there is no signs of hemorrhage or obvious acute ischemia. There has been interval removal of pituitary mass in the last 3 years, I have confirmed this with the son who states that there was a benign mass which was removed. She has had no post surgical seizures or complications. He states this was several years ago. The patient remains symptomatic with facial droop and slurred speech with somnolence.  Lab results show that the patient is hyperglycemic without an anion gap acidosis, normal bicarbonate level, urinalysis with slight proteinuria and glucosuria but no significant infections and no dehydration. Troponin is normal, blood counts are normal, electrolytes otherwise are normal.  Discussed with the hospitalist Dr. Orvan Falconer who will admit.  Meds given in ED:  Medications  insulin regular (NOVOLIN R,HUMULIN R) 1 Units/mL in sodium chloride 0.9 % 100 mL infusion (3.4 Units/hr Intravenous New Bag/Given 01/29/13 0130)  aspirin chewable tablet 324 mg (not administered)  sodium chloride 0.9 % bolus 1,000 mL (1,000 mLs Intravenous New Bag/Given 01/29/13 0108)    New Prescriptions   No medications on file      Vida Roller, MD 01/29/13  [phone]

## 2013-01-29 NOTE — Progress Notes (Signed)
EEG Completed; Results Pending  

## 2013-01-29 NOTE — Progress Notes (Signed)
Patient admitted by Dr. Orvan Falconer earlier this morning.   She has been admitted with possible stroke like symptoms, hypersomnolence.   Patient seen and examined.  She answers questions with a few words, but often falls asleep in mid sentence. Initial CT of the brain was non acute.  Neurology following.  EEG has been ordered. Will order ABG and ammonia to rule out other metabolic causes.  Hold sedative medications.  Continue to follow.  MEMON,JEHANZEB

## 2013-01-30 ENCOUNTER — Inpatient Hospital Stay (HOSPITAL_COMMUNITY): Payer: Medicare Other

## 2013-01-30 DIAGNOSIS — I517 Cardiomegaly: Secondary | ICD-10-CM

## 2013-01-30 DIAGNOSIS — G4700 Insomnia, unspecified: Secondary | ICD-10-CM

## 2013-01-30 DIAGNOSIS — G9340 Encephalopathy, unspecified: Principal | ICD-10-CM

## 2013-01-30 LAB — GLUCOSE, CAPILLARY
Glucose-Capillary: 116 mg/dL — ABNORMAL HIGH (ref 70–99)
Glucose-Capillary: 147 mg/dL — ABNORMAL HIGH (ref 70–99)
Glucose-Capillary: 187 mg/dL — ABNORMAL HIGH (ref 70–99)
Glucose-Capillary: 213 mg/dL — ABNORMAL HIGH (ref 70–99)
Glucose-Capillary: 217 mg/dL — ABNORMAL HIGH (ref 70–99)
Glucose-Capillary: 220 mg/dL — ABNORMAL HIGH (ref 70–99)
Glucose-Capillary: 279 mg/dL — ABNORMAL HIGH (ref 70–99)
Glucose-Capillary: 309 mg/dL — ABNORMAL HIGH (ref 70–99)

## 2013-01-30 LAB — BASIC METABOLIC PANEL
BUN: 8 mg/dL (ref 6–23)
CO2: 26 mEq/L (ref 19–32)
Calcium: 9.2 mg/dL (ref 8.4–10.5)
Chloride: 106 mEq/L (ref 96–112)
Creatinine, Ser: 0.79 mg/dL (ref 0.50–1.10)
Glucose, Bld: 178 mg/dL — ABNORMAL HIGH (ref 70–99)
Potassium: 3.8 mEq/L (ref 3.5–5.1)

## 2013-01-30 MED ORDER — TRAMADOL HCL 50 MG PO TABS
50.0000 mg | ORAL_TABLET | Freq: Four times a day (QID) | ORAL | Status: DC | PRN
  Administered 2013-01-30: 50 mg via ORAL
  Filled 2013-01-30 (×2): qty 1

## 2013-01-30 MED ORDER — ASPIRIN 325 MG PO TABS
325.0000 mg | ORAL_TABLET | Freq: Every day | ORAL | Status: DC
  Administered 2013-01-30 – 2013-01-31 (×2): 325 mg via ORAL
  Filled 2013-01-30 (×2): qty 1

## 2013-01-30 MED ORDER — CHLORPROMAZINE HCL 100 MG PO TABS
150.0000 mg | ORAL_TABLET | Freq: Every day | ORAL | Status: DC
  Administered 2013-01-30: 150 mg via ORAL
  Filled 2013-01-30: qty 2

## 2013-01-30 MED ORDER — ASPIRIN 300 MG RE SUPP
300.0000 mg | Freq: Every day | RECTAL | Status: DC
  Filled 2013-01-30 (×5): qty 1

## 2013-01-30 MED ORDER — ACETAMINOPHEN 325 MG PO TABS: 650.0000 mg | ORAL_TABLET | Freq: Four times a day (QID) | ORAL | Status: DC | PRN

## 2013-01-30 NOTE — Progress Notes (Signed)
[facility] PROGRESS NOTE  AKAYLAH LALLEY ZOX:[id number] DOB: 1945/08/12 DOA: 01/29/2013 PCP: Kirstie Peri, MD  Assessment/Plan: 1. Acute encephalopathy. Appears to have resolved. Felt to be related to sleep apnea and possible sleep deprivation. CT of the head was unremarkable. Metabolic workup as well as EEG is also been unremarkable. Neurology is following. The patient is planned to have CPAP titration to be done as an outpatient. 2.  hypertension. Stable. 3. Diabetes. Blood sugars are currently stable and improved from admission. 4. Insomnia. Neurology has recommended to continue Thorazine at night  Code Status:Full code Family Communication: discussed with patient and son at the bedside Disposition Plan: Anticipate discharge home in am   Consultants:  Neurology  Procedures:  EEG: normal awake and sleep study phases  Antibiotics:  none  HPI/Subjective: Feeling better, awake, alert, no new complaints  Objective: Filed Vitals:   01/30/13 1311  BP: 170/90  Pulse: 93  Temp: 98 F (36.7 C)  Resp: 20    Intake/Output Summary (Last 24 hours) at 01/30/13 2041 Last data filed at 01/30/13 1846  Gross per 24 hour  Intake 2531.67 ml  Output   3400 ml  Net -868.33 ml   Filed Weights   01/29/13 0124 01/29/13 0332  Weight: 79.833 kg (176 lb) 78.835 kg (173 lb 12.8 oz)    Exam:   General:  NAD  Cardiovascular: S1, S2 RRR  Respiratory: CTA B  Abdomen: soft,nt, nd, bs+  Musculoskeletal: no edema b/l   Data Reviewed: Basic Metabolic Panel:  Recent Labs Lab 01/29/13 0023 01/29/13 0059 01/30/13 0533  NA 135 140 142  K 4.1 4.2 3.8  CL 97 102 106  CO2 27  --  26  GLUCOSE 437* 442* 178*  BUN 18 18 8   CREATININE 0.96 1.00 0.79  CALCIUM 9.7  --  9.2   Liver Function Tests:  Recent Labs Lab 01/29/13 0023  AST 16  ALT 12  ALKPHOS 139*  BILITOT 0.2*  PROT 8.0  ALBUMIN 3.8   No results found for this basename: LIPASE, AMYLASE,  in the last  168 hours  Recent Labs Lab 01/29/13 1502  AMMONIA 27   CBC:  Recent Labs Lab 01/29/13 0023 01/29/13 0059  WBC 9.8  --   NEUTROABS 6.8  --   HGB 12.0 13.3  HCT 37.1 39.0  MCV 83.2  --   PLT 309  --    Cardiac Enzymes:  Recent Labs Lab 01/29/13 0023  TROPONINI <0.30   BNP (last 3 results) No results found for this basename: PROBNP,  in the last 8760 hours CBG:  Recent Labs Lab 01/30/13 0415 01/30/13 0819 01/30/13 1117 01/30/13 1612 01/30/13 2005  GLUCAP 213* 217* 309* 116* 147*    No results found for this or any previous visit (from the past 240 hour(s)).   Studies: Ct Head Wo Contrast  01/30/2013   CLINICAL DATA:  Followup for ataxia/dysarthria.  EXAM: CT HEAD WITHOUT CONTRAST  TECHNIQUE: Contiguous axial images were obtained from the base of the skull through the vertex without intravenous contrast.  COMPARISON:  01/29/2013 and 10/30/2004  FINDINGS: Ventricles, cisterns and other CSF spaces are within normal. There is mild the chronic ischemic microvascular disease present. There is no mass, mass effect, shift of midline structures or acute hemorrhage. There is no evidence to suggest acute infarction. Remainder of the exam is unchanged.  IMPRESSION: No acute intracranial findings.  Mild chronic ischemic microvascular disease.   Electronically Signed   By: Elberta Fortis M.D.  On: 01/30/2013 08:41   Ct Head Wo Contrast  01/29/2013   CLINICAL DATA:  66-year- female. Acute stroke abnormal mental status. Hyperglycemia and generalized weakness. Last seen normal 6 hr ago. Initial encounter.  EXAM: CT HEAD WITHOUT CONTRAST  TECHNIQUE: Contiguous axial images were obtained from the base of the skull through the vertex without intravenous contrast.  COMPARISON:  Brain MRI 08/2009 comment head CT 10/30/2004.  FINDINGS: Left maxillary sinus mucous retention cyst. Other Visualized paranasal sinuses and mastoids are clear. No acute osseous abnormality identified. Visualized orbits  and scalp soft tissues are within normal limits.  Only mildly decreased cerebral volume since 2006. No ventriculomegaly. Chronic bulky dural calcifications in her mainly noted. No midline shift, mass effect, or evidence of intracranial mass lesion. No acute intracranial hemorrhage identified. Patchy cerebral white matter hypodensities. No evidence of cortically based acute infarction identified. No suspicious intracranial vascular hyperdensity. 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  IMPRESSION: Chronic nonspecific white matter changes. No acute cortically based infarct or acute intracranial hemorrhage.  Study discussed by telephone with Dr. Eber Hong on 01/29/2013 at 00:48 .   Electronically Signed   By: Augusto Gamble M.D.   On: 01/29/2013 00:50   US Carotid Duplex Bilateral  01/29/2013   CLINICAL DATA:  STROKE, HYPERTENSION, DIABETES, TOBACCO USE.  EXAM: BILATERAL CAROTID DUPLEX ULTRASOUND  TECHNIQUE: Wallace Cullens scale imaging, color Doppler and duplex ultrasound was performed of bilateral carotid and vertebral arteries in the neck.  COMPARISON:  None.  REVIEW OF SYSTEMS: Quantification of carotid stenosis is based on velocity parameters that correlate the residual internal carotid diameter with NASCET-based stenosis levels, using the diameter of the distal internal carotid lumen as the denominator for stenosis measurement.  The following velocity measurements were obtained:  PEAK SYSTOLIC/END DIASTOLIC  RIGHT  ICA:                     70/20cm/sec  CCA:                     81/13cm/sec  SYSTOLIC ICA/CCA RATIO:  0.86  DIASTOLIC ICA/CCA RATIO: 1.51  ECA:                     42cm/sec  LEFT  ICA:                     51/15cm/sec  CCA:                     79/14cm/sec  SYSTOLIC ICA/CCA RATIO:  0.65  DIASTOLIC ICA/CCA RATIO: 1.07  ECA:                     39cm/sec  FINDINGS: RIGHT CAROTID ARTERY: Mild intimal thickening in the common carotid artery. No focal plaque  accumulation or stenosis. Normal waveforms and color Doppler signal. Poor visualization of the distal ICA.  RIGHT VERTEBRAL ARTERY:  Normal flow direction and waveform.  LEFT CAROTID ARTERY: Intimal thickening in the common carotid artery. No focal plaque accumulation or stenosis evident. Distal ICA mildly tortuous. Normal waveforms and color Doppler signal.  LEFT VERTEBRAL ARTERY: Normal flow direction and waveform.  IMPRESSION: No significant carotid bifurcation plaque or stenosis.   Electronically Signed   By: Oley Balm M.D.   On: 01/29/2013 15:24   Dg Chest Portable 1 View  01/29/2013   CLINICAL DATA:  Hyperglycemia and weakness.  EXAM: PORTABLE CHEST - 1 VIEW  COMPARISON:  Chest radiograph January 11, 2013  FINDINGS: Cardiomediastinal silhouette is unremarkable for this low inspiratory portable examination with crowded vasculature markings. The lungs are clear without pleural effusions or focal consolidations. Trachea projects midline and there is no pneumothorax. Included soft tissue planes and osseous structures are non-suspicious. Epidural stimulator leads project in mid thoracic spine. Multiple EKG lines overlie the patient and may obscure subtle underlying pathology. Surgical clips in the right abdomen consistent with cholecystectomy.  IMPRESSION: No acute cardiopulmonary process for this low inspiratory portable examination.   Electronically Signed   By: Awilda Metro   On: 01/29/2013 00:48    Scheduled Meds: . aspirin  300 mg Rectal Daily   Or  . aspirin  325 mg Oral Daily  . chlorproMAZINE  150 mg Oral QHS  . enoxaparin (LOVENOX) injection  40 mg Subcutaneous Q24H  . insulin aspart  0-9 Units Subcutaneous Q4H   Continuous Infusions:   Principal Problem:   Acute encephalopathy Active Problems:   Hypertension   OSA on CPAP   Stroke   Diabetes mellitus type 2, uncontrolled   Obesity, unspecified    Time spent:    [facility]  [facility] Pager  [phone]. If 7PM-7AM, please contact night-coverage at [email or web address], password [facility] 01/30/2013, 8:41 PM  LOS: 1 day

## 2013-01-30 NOTE — Procedures (Signed)
[facility] Montague Corella A. Gerilyn Pilgrim, MD     [email or web address]        NAMEAVALIE, OCONNOR              ACCOUNT NO.:  [id number]  MEDICAL RECORD NO.:  [id number]  LOCATION:  EE                           FACILITY:  [facility]  PHYSICIAN:  Jarrett Chicoine A. Gerilyn Pilgrim, M.D. DATE OF BIRTH:  [birth year]  DATE OF PROCEDURE: DATE OF DISCHARGE:  01/29/2013                             EEG INTERPRETATION   INDICATION:  This is a 67 year old female who presents with altered mental status.  The study is being done to evaluate for possible seizures.  MEDICATIONS:  Tylenol, Dulcolax, Lovenox, Zofran, insulin.  ANALYSIS:  A 16-channel recording using standard 10/20 measurements is carried out for 20 minutes.  There is a well-formed posterior dominant rhythm of 10 Hz, which attenuates with eye opening.  There is beta activity observed in the frontal areas.  Awake and sleep activities are recorded.  K complexes and sleep spindles are observed. Hyperventilation is carried out without abnormal changes in the background activity.  There is no focal or lateralized slowing.  There is no epileptiform activity observed.  IMPRESSION:  Normal recording of awake and sleep states.     Bernadean Saling A. Gerilyn Pilgrim, M.D.     KAD/MEDQ  D:  01/30/2013  T:  01/30/2013  Job:  [id number]

## 2013-01-30 NOTE — Progress Notes (Signed)
Patient ID: Rebecca Richards, female   DOB: 05-Jun-1945, 67 y.o.   MRN: [id number]  [facility] NEUROLOGY Rebecca Brazil A. Rebecca Pilgrim, MD     [email or web address]          Rebecca Richards is an 67 y.o. female.   Assessment/Plan: Acute onset of gait impairment and altered mental status along with ataxia of unclear etiology. The patient is suspected of having sleep deprivation and severe hypersomnia as etiology. Metabolic workup has been unrevealing. Repeat imaging showed no acute infarcts. The patient will be set up in the outpatient setting to have a sleep study and appropriate treatment for her untreated obstructive sleep apnea syndrome. She can continue with Thorazine to help with sleep consolidation. Although no stroke have been documented, continuing with aspirin is suggested.  The patient reports that she slept better last night although she still woke up and cannot return to sleep. For unknown reasons, she did not get her positive pressure treatment last night. This should be investigated further and see why this was not set up. The patient reports that she still woke up at about 12 midnight and had difficulties returning to sleep. She did sleep currently before then however. The patient repeat head CT scan shows no acute infarct. Additional workup of labs and EEG are also reviewed and are all unremarkable. Patient complains of persistent low back pain.  The patient is awake and alert. She is lucid and coherent. Facial muscle strength is symmetric. She moved both sides well.     Objective: Vital signs in last 24 hours: Temp:  [97.3 F (36.3 C)-98.4 F (36.9 C)] 98 F (36.7 C) (11/06 4098) Pulse Rate:  [78-89] 86 (11/06 0822) Resp:  [20-22] 20 (11/06 0822) BP: (115-131)/(74-84) 125/84 mmHg (11/06 0822) SpO2:  [99 %-100 %] 99 % (11/06 0822)  Intake/Output from previous day: 11/05 0701 - 11/06 0700 In: 2766.7 [P.O.:360; I.V.:2406.7] Out: 2600 [Urine:2600] Intake/Output this shift:     Nutritional status: Clear Liquid   Lab Results: Results for orders placed during the hospital encounter of 01/29/13 (from the past 48 hour(s))  GLUCOSE, CAPILLARY     Status: Abnormal   Collection Time    01/29/13 12:10 AM      Result Value Range   Glucose-Capillary 396 (*) 70 - 99 mg/dL   Comment 1 Documented in Chart     Comment 2 Notify RN    CBC WITH DIFFERENTIAL     Status: Abnormal   Collection Time    01/29/13 12:23 AM      Result Value Range   WBC 9.8  4.0 - 10.5 K/uL   RBC 4.46  3.87 - 5.11 MIL/uL   Hemoglobin 12.0  12.0 - 15.0 g/dL   HCT 11.9  14.7 - 82.9 %   MCV 83.2  78.0 - 100.0 fL   MCH 26.9  26.0 - 34.0 pg   MCHC 32.3  30.0 - 36.0 g/dL   RDW 56.2  13.0 - 86.5 %   Platelets 309  150 - 400 K/uL   Neutrophils Relative % 68  43 - 77 %   Neutro Abs 6.8  1.7 - 7.7 K/uL   Lymphocytes Relative 16  12 - 46 %   Lymphs Abs 1.6  0.7 - 4.0 K/uL   Monocytes Relative 9  3 - 12 %   Monocytes Absolute 0.8  0.1 - 1.0 K/uL   Eosinophils Relative 6 (*) 0 - 5 %   Eosinophils Absolute 0.6  0.0 - 0.7 K/uL  Basophils Relative 1  0 - 1 %   Basophils Absolute 0.1  0.0 - 0.1 K/uL  COMPREHENSIVE METABOLIC PANEL     Status: Abnormal   Collection Time    01/29/13 12:23 AM      Result Value Range   Sodium 135  135 - 145 mEq/L   Potassium 4.1  3.5 - 5.1 mEq/L   Chloride 97  96 - 112 mEq/L   CO2 27  19 - 32 mEq/L   Glucose, Bld 437 (*) 70 - 99 mg/dL   BUN 18  6 - 23 mg/dL   Creatinine, Ser 1.61  0.50 - 1.10 mg/dL   Calcium 9.7  8.4 - 09.6 mg/dL   Total Protein 8.0  6.0 - 8.3 g/dL   Albumin 3.8  3.5 - 5.2 g/dL   AST 16  0 - 37 U/L   ALT 12  0 - 35 U/L   Alkaline Phosphatase 139 (*) 39 - 117 U/L   Total Bilirubin 0.2 (*) 0.3 - 1.2 mg/dL   GFR calc non Af Amer 61 (*) >90 mL/min   GFR calc Af Amer 71 (*) >90 mL/min   Comment: (NOTE)     The eGFR has been calculated using the CKD EPI equation.     This calculation has not been validated in all clinical situations.     eGFR's  persistently <90 mL/min signify possible Chronic Kidney     Disease.  ETHANOL     Status: None   Collection Time    01/29/13 12:23 AM      Result Value Range   Alcohol, Ethyl (B) <11  0 - 11 mg/dL   Comment:            LOWEST DETECTABLE LIMIT FOR     SERUM ALCOHOL IS 11 mg/dL     FOR MEDICAL PURPOSES ONLY  PROTIME-INR     Status: None   Collection Time    01/29/13 12:23 AM      Result Value Range   Prothrombin Time 13.9  11.6 - 15.2 seconds   INR 1.09  0.00 - 1.49  APTT     Status: None   Collection Time    01/29/13 12:23 AM      Result Value Range   aPTT 32  24 - 37 seconds  TROPONIN I     Status: None   Collection Time    01/29/13 12:23 AM      Result Value Range   Troponin I <0.30  <0.30 ng/mL   Comment:            Due to the release kinetics of cTnI,     a negative result within the first hours     of the onset of symptoms does not rule out     myocardial infarction with certainty.     If myocardial infarction is still suspected,     repeat the test at appropriate intervals.  POCT I-STAT TROPONIN I     Status: None   Collection Time    01/29/13 12:58 AM      Result Value Range   Troponin i, poc 0.01  0.00 - 0.08 ng/mL   Comment 3            Comment: Due to the release kinetics of cTnI,     a negative result within the first hours     of the onset of symptoms does not rule out     myocardial infarction  with certainty.     If myocardial infarction is still suspected,     repeat the test at appropriate intervals.  POCT I-STAT, CHEM 8     Status: Abnormal   Collection Time    01/29/13 12:59 AM      Result Value Range   Sodium 140  135 - 145 mEq/L   Potassium 4.2  3.5 - 5.1 mEq/L   Chloride 102  96 - 112 mEq/L   BUN 18  6 - 23 mg/dL   Creatinine, Ser 8.11  0.50 - 1.10 mg/dL   Glucose, Bld 914 (*) 70 - 99 mg/dL   Calcium, Ion 7.82  9.56 - 1.30 mmol/L   TCO2 26  0 - 100 mmol/L   Hemoglobin 13.3  12.0 - 15.0 g/dL   HCT 21.3  08.6 - 57.8 %  URINE RAPID DRUG  SCREEN (HOSP PERFORMED)     Status: Abnormal   Collection Time    01/29/13  1:17 AM      Result Value Range   Opiates NONE DETECTED  NONE DETECTED   Cocaine NONE DETECTED  NONE DETECTED   Benzodiazepines POSITIVE (*) NONE DETECTED   Amphetamines NONE DETECTED  NONE DETECTED   Tetrahydrocannabinol NONE DETECTED  NONE DETECTED   Barbiturates NONE DETECTED  NONE DETECTED   Comment:            DRUG SCREEN FOR MEDICAL PURPOSES     ONLY.  IF CONFIRMATION IS NEEDED     FOR ANY PURPOSE, NOTIFY LAB     WITHIN 5 DAYS.                LOWEST DETECTABLE LIMITS     FOR URINE DRUG SCREEN     Drug Class       Cutoff (ng/mL)     Amphetamine      1000     Barbiturate      200     Benzodiazepine   200     Tricyclics       300     Opiates          300     Cocaine          300     THC              50  URINALYSIS, ROUTINE W REFLEX MICROSCOPIC     Status: Abnormal   Collection Time    01/29/13  1:17 AM      Result Value Range   Color, Urine YELLOW  YELLOW   APPearance CLEAR  CLEAR   Specific Gravity, Urine 1.020  1.005 - 1.030   pH 6.0  5.0 - 8.0   Glucose, UA >1000 (*) NEGATIVE mg/dL   Hgb urine dipstick TRACE (*) NEGATIVE   Bilirubin Urine NEGATIVE  NEGATIVE   Ketones, ur NEGATIVE  NEGATIVE mg/dL   Protein, ur 30 (*) NEGATIVE mg/dL   Urobilinogen, UA 0.2  0.0 - 1.0 mg/dL   Nitrite NEGATIVE  NEGATIVE   Leukocytes, UA NEGATIVE  NEGATIVE  URINE MICROSCOPIC-ADD ON     Status: None   Collection Time    01/29/13  1:17 AM      Result Value Range   Squamous Epithelial / LPF RARE  RARE   RBC / HPF 0-2  <3 RBC/hpf   Bacteria, UA RARE  RARE  GLUCOSE, CAPILLARY     Status: Abnormal   Collection Time    01/29/13  1:26 AM  Result Value Range   Glucose-Capillary 395 (*) 70 - 99 mg/dL   Comment 1 Documented in Chart     Comment 2 Notify RN    GLUCOSE, CAPILLARY     Status: Abnormal   Collection Time    01/29/13  2:39 AM      Result Value Range   Glucose-Capillary 299 (*) 70 - 99 mg/dL    Comment 1 Documented in Chart    LIPID PANEL     Status: None   Collection Time    01/29/13  5:29 AM      Result Value Range   Cholesterol 154  0 - 200 mg/dL   Triglycerides 098  <119 mg/dL   HDL 59  >14 mg/dL   Total CHOL/HDL Ratio 2.6     VLDL 28  0 - 40 mg/dL   LDL Cholesterol 67  0 - 99 mg/dL   Comment:            Total Cholesterol/HDL:CHD Risk     Coronary Heart Disease Risk Table                         Men   Women      1/2 Average Risk   3.4   3.3      Average Risk       5.0   4.4      2 X Average Risk   9.6   7.1      3 X Average Risk  23.4   11.0                Use the calculated Patient Ratio     above and the CHD Risk Table     to determine the patient's CHD Risk.                ATP III CLASSIFICATION (LDL):      <100     mg/dL   Optimal      782-956  mg/dL   Near or Above                        Optimal      130-159  mg/dL   Borderline      213-086  mg/dL   High      >578     mg/dL   Very High  HEMOGLOBIN A1C     Status: Abnormal   Collection Time    01/29/13  5:29 AM      Result Value Range   Hemoglobin A1C 11.4 (*) <5.7 %   Comment: (NOTE)                                                                               According to the ADA Clinical Practice Recommendations for 2011, when     HbA1c is used as a screening test:      >=6.5%   Diagnostic of Diabetes Mellitus               (if abnormal result is confirmed)     5.7-6.4%   Increased risk of developing Diabetes Mellitus  References:Diagnosis and Classification of Diabetes Mellitus,Diabetes     Care,2011,34(Suppl 1):S62-S69 and Standards of Medical Care in             Diabetes - 2011,Diabetes Care,2011,34 (Suppl 1):S11-S61.   Mean Plasma Glucose 280 (*) <117 mg/dL   Comment: Performed at [facility]  RPR     Status: None   Collection Time    01/29/13  5:29 AM      Result Value Range   RPR NON REACTIVE  NON REACTIVE   Comment: Performed at [facility]  HOMOCYSTEINE      Status: None   Collection Time    01/29/13  5:29 AM      Result Value Range   Homocysteine 6.6  4.0 - 15.4 umol/L   Comment: Performed at [facility]  GLUCOSE, CAPILLARY     Status: Abnormal   Collection Time    01/29/13  5:33 AM      Result Value Range   Glucose-Capillary 325 (*) 70 - 99 mg/dL  GLUCOSE, CAPILLARY     Status: Abnormal   Collection Time    01/29/13  7:32 AM      Result Value Range   Glucose-Capillary 232 (*) 70 - 99 mg/dL   Comment 1 Notify RN    VITAMIN B12     Status: Abnormal   Collection Time    01/29/13  9:13 AM      Result Value Range   Vitamin B-12 1680 (*) 211 - 911 pg/mL   Comment: Performed at [facility]  GLUCOSE, CAPILLARY     Status: Abnormal   Collection Time    01/29/13 11:37 AM      Result Value Range   Glucose-Capillary 213 (*) 70 - 99 mg/dL   Comment 1 Notify RN    BLOOD GAS, ARTERIAL     Status: Abnormal   Collection Time    01/29/13  2:32 PM      Result Value Range   FIO2 0.21     pH, Arterial 7.384  7.350 - 7.450   pCO2 arterial 43.6  35.0 - 45.0 mmHg   pO2, Arterial 73.6 (*) 80.0 - 100.0 mmHg   Bicarbonate 25.4 (*) 20.0 - 24.0 mEq/L   TCO2 23.2  0 - 100 mmol/L   Acid-Base Excess 0.9  0.0 - 2.0 mmol/L   O2 Saturation 94.3     Patient temperature 37.0     Collection site RIGHT RADIAL     Drawn by COLLECTED BY RT     Sample type ARTERIAL     Allens test (pass/fail) PASS  PASS  TSH     Status: None   Collection Time    01/29/13  2:59 PM      Result Value Range   TSH 0.545  0.350 - 4.500 uIU/mL   Comment: Performed at [facility]  AMMONIA     Status: None   Collection Time    01/29/13  3:02 PM      Result Value Range   Ammonia 27  11 - 60 umol/L  BASIC METABOLIC PANEL     Status: Abnormal   Collection Time    01/30/13  5:33 AM      Result Value Range   Sodium 142  135 - 145 mEq/L   Potassium 3.8  3.5 - 5.1 mEq/L   Chloride 106  96 - 112 mEq/L   CO2 26  19 - 32 mEq/L   Glucose, Bld  178 (*) 70 -  99 mg/dL   BUN 8  6 - 23 mg/dL   Creatinine, Ser 4.09  0.50 - 1.10 mg/dL   Calcium 9.2  8.4 - 81.1 mg/dL   GFR calc non Af Amer 86 (*) >90 mL/min   GFR calc Af Amer >90  >90 mL/min   Comment: (NOTE)     The eGFR has been calculated using the CKD EPI equation.     This calculation has not been validated in all clinical situations.     eGFR's persistently <90 mL/min signify possible Chronic Kidney     Disease.    Lipid Panel  Recent Labs  01/29/13 0529  CHOL 154  TRIG 139  HDL 59  CHOLHDL 2.6  VLDL 28  LDLCALC 67    Studies/Results: Ct Head Wo Contrast  01/29/2013   CLINICAL DATA:  66-year- female. Acute stroke abnormal mental status. Hyperglycemia and generalized weakness. Last seen normal 6 hr ago. Initial encounter.  EXAM: CT HEAD WITHOUT CONTRAST  TECHNIQUE: Contiguous axial images were obtained from the base of the skull through the vertex without intravenous contrast.  COMPARISON:  Brain MRI 08/2009 comment head CT 10/30/2004.  FINDINGS: Left maxillary sinus mucous retention cyst. Other Visualized paranasal sinuses and mastoids are clear. No acute osseous abnormality identified. Visualized orbits and scalp soft tissues are within normal limits.  Only mildly decreased cerebral volume since 2006. No ventriculomegaly. Chronic bulky dural calcifications in her mainly noted. No midline shift, mass effect, or evidence of intracranial mass lesion. No acute intracranial hemorrhage identified. Patchy cerebral white matter hypodensities. No evidence of cortically based acute infarction identified. No suspicious intracranial vascular hyperdensity. 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  IMPRESSION: Chronic nonspecific white matter changes. No acute cortically based infarct or acute intracranial hemorrhage.  Study discussed by telephone with Dr. Eber Hong on 01/29/2013 at 00:48 .   Electronically Signed   By: Augusto Gamble M.D.   On:  01/29/2013 00:50   US Carotid Duplex Bilateral  01/29/2013   CLINICAL DATA:  STROKE, HYPERTENSION, DIABETES, TOBACCO USE.  EXAM: BILATERAL CAROTID DUPLEX ULTRASOUND  TECHNIQUE: Wallace Cullens scale imaging, color Doppler and duplex ultrasound was performed of bilateral carotid and vertebral arteries in the neck.  COMPARISON:  None.  REVIEW OF SYSTEMS: Quantification of carotid stenosis is based on velocity parameters that correlate the residual internal carotid diameter with NASCET-based stenosis levels, using the diameter of the distal internal carotid lumen as the denominator for stenosis measurement.  The following velocity measurements were obtained:  PEAK SYSTOLIC/END DIASTOLIC  RIGHT  ICA:                     70/20cm/sec  CCA:                     81/13cm/sec  SYSTOLIC ICA/CCA RATIO:  0.86  DIASTOLIC ICA/CCA RATIO: 1.51  ECA:                     42cm/sec  LEFT  ICA:                     51/15cm/sec  CCA:                     79/14cm/sec  SYSTOLIC ICA/CCA RATIO:  0.65  DIASTOLIC ICA/CCA RATIO: 1.07  ECA:  39cm/sec  FINDINGS: RIGHT CAROTID ARTERY: Mild intimal thickening in the common carotid artery. No focal plaque accumulation or stenosis. Normal waveforms and color Doppler signal. Poor visualization of the distal ICA.  RIGHT VERTEBRAL ARTERY:  Normal flow direction and waveform.  LEFT CAROTID ARTERY: Intimal thickening in the common carotid artery. No focal plaque accumulation or stenosis evident. Distal ICA mildly tortuous. Normal waveforms and color Doppler signal.  LEFT VERTEBRAL ARTERY: Normal flow direction and waveform.  IMPRESSION: No significant carotid bifurcation plaque or stenosis.   Electronically Signed   By: Oley Balm M.D.   On: 01/29/2013 15:24   Dg Chest Portable 1 View  01/29/2013   CLINICAL DATA:  Hyperglycemia and weakness.  EXAM: PORTABLE CHEST - 1 VIEW  COMPARISON:  Chest radiograph January 11, 2013  FINDINGS: Cardiomediastinal silhouette is unremarkable for this low  inspiratory portable examination with crowded vasculature markings. The lungs are clear without pleural effusions or focal consolidations. Trachea projects midline and there is no pneumothorax. Included soft tissue planes and osseous structures are non-suspicious. Epidural stimulator leads project in mid thoracic spine. Multiple EKG lines overlie the patient and may obscure subtle underlying pathology. Surgical clips in the right abdomen consistent with cholecystectomy.  IMPRESSION: No acute cardiopulmonary process for this low inspiratory portable examination.   Electronically Signed   By: Awilda Metro   On: 01/29/2013 00:48    REPEAT HEAD CT 01-30-13  CT HEAD WITHOUT CONTRAST  TECHNIQUE:  Contiguous axial images were obtained from the base of the skull  through the vertex without intravenous contrast.  COMPARISON: 01/29/2013 and 10/30/2004  FINDINGS:  Ventricles, cisterns and other CSF spaces are within normal. There  is mild the chronic ischemic microvascular disease present. There is  no mass, mass effect, shift of midline structures or acute  hemorrhage. There is no evidence to suggest acute infarction.  Remainder of the exam is unchanged.  IMPRESSION:  No acute intracranial findings.  Mild chronic ischemic microvascular disease.      Medications:  Scheduled Meds: . aspirin  300 mg Rectal Daily  . enoxaparin (LOVENOX) injection  40 mg Subcutaneous Q24H  . insulin aspart  0-9 Units Subcutaneous Q4H   Continuous Infusions: . sodium chloride 100 mL/hr at 01/30/13 0600   PRN Meds:.acetaminophen, bisacodyl, ondansetron (ZOFRAN) IV, sodium phosphate    LOS: 1 day   Corbyn Wildey A. Rebecca Richards, M.D.  Diplomate, Biomedical engineer of Psychiatry and Neurology ( Neurology).

## 2013-01-30 NOTE — Evaluation (Signed)
Physical Therapy Evaluation Patient Details Name: BERNISE SYLVAIN MRN: [id number] DOB: 09-Aug-1945 Today's Date: 01/30/2013 Time: 5621-3086 PT Time Calculation (min): 41 min  PT Assessment / Plan / Recommendation History of Present Illness  Pt is admitted due to altered mental status with gait ataxia of unknown origin.  She suffers with chronic lumbar pain.  Clinical Impression  Pt is seen for evaluation and found to be at functional baseline.  No abnormal patterns of motion were observed.  PT Assessment  Patent does not need any further PT services    Follow Up Recommendations  No PT follow up    Does the patient have the potential to tolerate intense rehabilitation      Barriers to Discharge        Equipment Recommendations  None recommended by PT    Recommendations for Other Services     Frequency      Precautions / Restrictions Precautions Precautions: None Restrictions Weight Bearing Restrictions: No   Pertinent Vitals/Pain       Mobility  Bed Mobility Bed Mobility: Supine to Sit;Sit to Supine Supine to Sit: 7: Independent Sit to Supine: 7: Independent Transfers Transfers: Sit to Stand;Stand to Sit Sit to Stand: 7: Independent Stand to Sit: 7: Independent Ambulation/Gait Ambulation/Gait Assistance: 7: Independent Ambulation Distance (Feet): 350 Feet Assistive device: None Gait Pattern: Within Functional Limits Gait velocity: WNL Stairs: No Wheelchair Mobility Wheelchair Mobility: No    Exercises     PT Diagnosis:    PT Problem List:   PT Treatment Interventions:       PT Goals(Current goals can be found in the care plan section) Acute Rehab PT Goals PT Goal Formulation: No goals set, d/c therapy  Visit Information  Last PT Received On: 01/30/13 History of Present Illness: Pt is admitted due to altered mental status with gait ataxia of unknown origin.  She suffers with chronic lumbar pain.       Prior Functioning  Home  Living Family/patient expects to be discharged to:: Private residence Living Arrangements: Alone Available Help at Discharge: Family;Available PRN/intermittently Type of Home: House Home Access: Level entry Home Layout: One level Home Equipment: Walker - 2 wheels;Cane - single point Prior Function Level of Independence: Independent Communication Communication: No difficulties    Cognition  Cognition Arousal/Alertness: Awake/alert Behavior During Therapy: WFL for tasks assessed/performed Overall Cognitive Status: Within Functional Limits for tasks assessed    Extremity/Trunk Assessment Lower Extremity Assessment Lower Extremity Assessment: Overall WFL for tasks assessed   Balance Balance Balance Assessed: Yes High Level Balance High Level Balance Activites: Side stepping;Backward walking;Turns;Direction changes;Sudden stops;Head turns High Level Balance Comments: no LOB with above activities  End of Session PT - End of Session Equipment Utilized During Treatment: Gait belt Activity Tolerance: Patient tolerated treatment well Patient left: in bed;with call bell/phone within reach;with bed alarm set  GP     Myrlene Broker L 01/30/2013, 12:16 PM

## 2013-01-30 NOTE — Progress Notes (Signed)
*  PRELIMINARY RESULTS* Echocardiogram 2D Echocardiogram has been performed.  Rebecca Richards 01/30/2013, 10:02 AM

## 2013-01-30 NOTE — Progress Notes (Signed)
Inpatient Diabetes Program Recommendations  AACE/ADA: New Consensus Statement on Inpatient Glycemic Control (2013)  Target Ranges:  Prepandial:   less than 140 mg/dL      Peak postprandial:   less than 180 mg/dL (1-2 hours)      Critically ill patients:  140 - 180 mg/dL   Results for KARALYNN, Rebecca Richards (MRN [id number]) as of 01/30/2013 09:26  Ref. Range 01/29/2013 05:29  Hemoglobin A1C Latest Range: <5.7 % 11.4 (H)   Results for SAILOR, Rebecca Richards (MRN [id number]) as of 01/30/2013 09:26  Ref. Range 01/29/2013 02:39 01/29/2013 05:33 01/29/2013 07:32 01/29/2013 11:37 01/29/2013 17:05 01/29/2013 20:02 01/29/2013 23:59 01/30/2013 08:19  Glucose-Capillary Latest Range: 70-99 mg/dL 629 (H) 528 (H) 413 (H) 213 (H) 229 (H) 279 (H) 187 (H) 217 (H)   Inpatient Diabetes Program Recommendations Insulin - Basal: Please consider ordering low dose basal insuliin; recommend starting with Levemir 10 untis daily. Correction (SSI): Please consider increasing Novolog correction to moderate correction scale.  Note: Currently patient is ordered Novolog 0-9 units Q4H for inpatient glycemic control.  Over the past 24 hours patient has received a total of Novolog 22 units for blood glucose correction.  Please consider ordering low dose basal and increasing Novolog correction to moderate scale.  Thanks, Orlando Penner, RN, MSN, CCRN Diabetes Coordinator Inpatient Diabetes Program [phone] (Team Pager) [phone] ([facility] office) [phone] [facility] office)

## 2013-01-31 LAB — GLUCOSE, CAPILLARY
Glucose-Capillary: 166 mg/dL — ABNORMAL HIGH (ref 70–99)
Glucose-Capillary: 327 mg/dL — ABNORMAL HIGH (ref 70–99)

## 2013-01-31 MED ORDER — CHLORPROMAZINE HCL 50 MG PO TABS: 150.0000 mg | ORAL_TABLET | Freq: Every day | ORAL | Status: DC

## 2013-01-31 MED ORDER — ASPIRIN EC 81 MG PO TBEC: 81.0000 mg | DELAYED_RELEASE_TABLET | Freq: Every day | ORAL | Status: DC

## 2013-01-31 MED ORDER — GLIPIZIDE 5 MG PO TABS: 5.0000 mg | ORAL_TABLET | Freq: Two times a day (BID) | ORAL | Status: DC

## 2013-01-31 NOTE — Evaluation (Signed)
Speech Language Pathology Evaluation Patient Details Name: Rebecca Richards MRN: [id number] DOB: [birth year] Today's Date: 01/31/2013 Time: 9629-5284 SLP Time Calculation (min): 20 min  Problem List:  Patient Active Problem List   Diagnosis Date Noted  . Stroke 01/29/2013  . Diabetes mellitus type 2, uncontrolled 01/29/2013  . Obesity, unspecified 01/29/2013  . Acute encephalopathy 01/29/2013  . OSA on CPAP 07/01/2011  . Insomnia 07/01/2011  . Cholelithiasis 07/01/2011  . Nausea & vomiting 06/30/2011  . Lactic acid increased 06/30/2011  . Diastolic heart failure 06/30/2011  . Ejection fraction   . Depression   . Hypertension   . Renal disorder   . Preop cardiovascular exam   . Brain tumor   . Edema   . Tobacco abuse    Past Medical History:  Past Medical History  Diagnosis Date  . Brain tumor     Surgery for benign brain tumor in the remote past  . Hypertension   . Preop cardiovascular exam     Cardiac clearance for knee surgery, January, 2013  . Edema   . Tobacco abuse   . Ejection fraction     EF 70%, echo, February, 2013  . Sleep apnea     uses Cpap  . Hepatitis 1970's    hep c  . Anxiety   . 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 Nasal congestion   . Diabetes mellitus without complication   . Insomnia   . 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 Spinal cord stimulator status     for pain   . Chronic knee pain   . Chronic back pain     with radiculopathy bilat legs  . Depression   . 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 Arthritis    Past Surgical History:  Past Surgical History  Procedure Laterality Date  . Abdominal hysterectomy    . Knee surgery    . Nose surgery    . Back surgery      pt has stimulator in lower back and can't have an MRI  . Total knee arthroplasty  07/21/2011    Procedure: TOTAL KNEE ARTHROPLASTY;  Surgeon: Thera Flake., MD;  Location: [facility] OR;  Service: Orthopedics;  Laterality: Left;  . 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 Cholecystectomy N/A 09/30/2012    Procedure: LAPAROSCOPIC CHOLECYSTECTOMY;  Surgeon: Fabio Bering, MD;  Location: [facility];  Service: General;  Laterality: N/A;   HPI:  Pt is a 67 y/o female who was admitted with altered mental status.    Assessment / Plan / Recommendation Clinical Impression  Pt seen for speech/language evaluation. Pt was found to be [facility] for all tasks assessed. Pt also stated that she was having no difficulty related to swallowing/feeding post admission. No SLP services recommended at this time.     SLP Assessment  Patient does not need any further Speech Lanaguage Pathology Services    Follow Up Recommendations  None    Frequency and Duration        Pertinent Vitals/Pain None reported.    SLP Goals   n/a  SLP Evaluation Prior Functioning  Type of Home: House Available Help at Discharge: Family;Available PRN/intermittently   Cognition  Overall Cognitive Status: Within Functional Limits for tasks assessed Arousal/Alertness: Awake/alert Orientation Level: Oriented X4    Comprehension  Auditory Comprehension Overall Auditory Comprehension: Appears within functional  limits for tasks assessed Visual Recognition/Discrimination Discrimination: Within Function Limits    Expression Expression Primary Mode of Expression: Verbal Verbal Expression Overall Verbal Expression: Appears within functional limits for tasks assessed   Oral / Motor Oral Motor/Sensory Function Overall Oral Motor/Sensory Function: Appears within functional limits for tasks assessed Motor Speech Overall Motor Speech: Appears within functional limits for tasks assessed   GO     Carlis Blanchard S 01/31/2013, 10:11 AM

## 2013-01-31 NOTE — Progress Notes (Signed)
Patient complaining about CPAP machine; RRT made appropriate adjustments and patient still complaining about the mask and the machine. Patient taken off CPAP and placed on 2 LPM per Lillia Abed, [place].

## 2013-01-31 NOTE — Discharge Summary (Signed)
Physician Discharge Summary  Rebecca Richards ZOX:[id number] DOB: Feb 09, 1946 DOA: 01/29/2013  PCP: Kirstie Peri, MD  Admit date: 01/29/2013 Discharge date: 01/31/2013  Time spent: 40 minutes  Recommendations for Outpatient Follow-up:  1. Follow up with primary care doctor in 2 weeks 2. Follow up with neurology to perform cpap titration  Discharge Diagnoses:  Principal Problem:   Acute encephalopathy Active Problems:   Hypertension   OSA on CPAP   Diabetes mellitus type 2, uncontrolled   Obesity, unspecified   Discharge Condition: improved  Diet recommendation: low salt  Filed Weights   01/29/13 0124 01/29/13 0332  Weight: 79.833 kg (176 lb) 78.835 kg (173 lb 12.8 oz)    History of present illness:  Rebecca Richards is a 67 y.o. female. Obese Caucasian lady with obstructive sleep apnea, hypertension, and uncontrolled diabetes was last seen without neurological deficits at about 6 PM last evening, was heard stumbling at home after 11 PM was found by her son to have right-sided weakness and brought to the emergency. On arrival at the emergency room at midnight code stroke was called and she was considered not to be a candidate because of the time right facial weakness and hyperglycemia were noted and hospitalist service was called for admission.  Although, according to her son, patient reportedly passed a swallowing study downstairs in the emergency room, Currently her speech is slurred she is very drowsy and does not cooperate with the history; however she was able to say she got up to use the bathroom, but then the rest of the sentence makes no sense. Her son is unable to understand.  The son reports that over the past 4 months since a diagnosis of diabetes blood sugars been very difficult to control. He reports her sleep study was repeated yesterday in order to get a new part for a CPAP machine.  She's not seeing a psychiatrist recently because her psychiatrist has moved; she does  taketake Thorazine for sleep since she has problems with insomnia.  She no longer smokes tobacco.  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  CT head was unremarkable.  She was not a candidate for MRI brain due to implanted neurostimulator.  She had a repeat CT head which was also unremarkable.  She was seen by neurology and underwent EEG which was unremarkable.  She was noted to be very somnolent.  This was felt to be due to her untreated sleep apnea and possibly severe insomnia.  She was provided thorazine at night, which she takes at home with good effect.  She does not appear to have any source of infection.  She is in the process of undergoing cpap titration as an outpatient.  The patient is now improved and ready for discharge. Glipizide has been added to her diabetic regimen and further titrations can be done as an outpatient.  Although she did not have a stroke per CT imaging, continuing daily aspirin was recommended by neurology.   Procedures: WJX:BJYNWG awake and sleep study phases    Consultations:  Neurology  Discharge Exam: Filed Vitals:   01/31/13 0546  BP: 125/86  Pulse: 85  Temp: 97.8 F (36.6 C)  Resp: 20    General: NAD Cardiovascular: S1, S2 RRR Respiratory: CTA B  Discharge Instructions  Discharge Orders   Future Appointments  Provider Department Dept Phone   02/19/2013 8:00 PM [facility] [phone]   Future Orders Complete By Expires   Diet - low sodium heart healthy  As directed    Increase activity slowly  As directed        Medication List         amLODipine 10 MG tablet  Commonly known as:  NORVASC  Take 10 mg by mouth daily.     aspirin EC 81 MG tablet  Take 1 tablet (81 mg total) by  mouth daily.     chlorproMAZINE 50 MG tablet  Commonly known as:  THORAZINE  Take 50 mg by mouth at bedtime.     CO Q 10 PO  Take 1 tablet by mouth 2 (two) times daily.     esomeprazole 40 MG capsule  Commonly known as:  NEXIUM  Take 40 mg by mouth daily as needed (for acid reflux).     glipiZIDE 5 MG tablet  Commonly known as:  GLUCOTROL  Take 1 tablet (5 mg total) by mouth 2 (two) times daily before a meal.     JANUVIA 100 MG tablet  Generic drug:  sitaGLIPtin  Take 1 tablet by mouth daily.     lisinopril 10 MG tablet  Commonly known as:  PRINIVIL,ZESTRIL  Take 10 mg by mouth daily.     naproxen 500 MG tablet  Commonly known as:  NAPROSYN  Take 1 tablet by mouth as needed.     PREVAGEN PO  Take 1 tablet by mouth daily.       No Known Allergies     Follow-up Information   Follow up with [facility], MD. Schedule an appointment as soon as possible for a visit in 2 weeks.   Specialty:  Internal Medicine   Contact information:   [address]  [place] [address] [phone]        The results of significant diagnostics from this hospitalization (including imaging, microbiology, ancillary and laboratory) are listed below for reference.    Significant Diagnostic Studies: Dg Chest 2 View  01/11/2013   CLINICAL DATA:  Shortness of breath, weakness and cough.  EXAM: CHEST  2 VIEW  COMPARISON:  PA and lateral chest 06/30/2011.  FINDINGS: Heart size is normal. Mild basilar atelectasis is noted. No pneumothorax or pleural fluid. No consolidative process.  IMPRESSION: No acute disease. Stable compared to prior exam.   Electronically Signed   By: Drusilla Kanner M.D.   On: 01/11/2013 15:46   Ct Head Wo Contrast  01/30/2013   CLINICAL DATA:  Followup for ataxia/dysarthria.  EXAM: CT HEAD WITHOUT CONTRAST  TECHNIQUE: Contiguous axial images were obtained from the base of the skull through the vertex without intravenous contrast.  COMPARISON:  01/29/2013 and 10/30/2004   FINDINGS: Ventricles, cisterns and other CSF spaces are within normal. There is mild the chronic ischemic microvascular disease present. There is no mass, mass effect, shift of midline structures or acute hemorrhage. There is no evidence to suggest acute infarction. Remainder of the exam is unchanged.  IMPRESSION: No acute intracranial findings.  Mild chronic ischemic microvascular disease.   Electronically Signed   By: Elberta Fortis M.D.   On: 01/30/2013 08:41   Ct Head Wo Contrast  01/29/2013   CLINICAL DATA:  66-year- female. Acute stroke abnormal mental status. Hyperglycemia and generalized weakness. Last seen normal 6 hr ago. Initial encounter.  EXAM: CT HEAD WITHOUT CONTRAST  TECHNIQUE: Contiguous  axial images were obtained from the base of the skull through the vertex without intravenous contrast.  COMPARISON:  Brain MRI 08/2009 comment head CT 10/30/2004.  FINDINGS: Left maxillary sinus mucous retention cyst. Other Visualized paranasal sinuses and mastoids are clear. No acute osseous abnormality identified. Visualized orbits and scalp soft tissues are within normal limits.  Only mildly decreased cerebral volume since 2006. No ventriculomegaly. Chronic bulky dural calcifications in her mainly noted. No midline shift, mass effect, or evidence of intracranial mass lesion. No acute intracranial hemorrhage identified. Patchy cerebral white matter hypodensities. No evidence of cortically based acute infarction identified. No suspicious intracranial vascular hyperdensity. 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  IMPRESSION: Chronic nonspecific white matter changes. No acute cortically based infarct or acute intracranial hemorrhage.  Study discussed by telephone with Dr. Eber Hong on 01/29/2013 at 00:48 .   Electronically Signed   By: Augusto Gamble M.D.   On: 01/29/2013 00:50   US Carotid Duplex Bilateral  01/29/2013   CLINICAL DATA:  STROKE,  HYPERTENSION, DIABETES, TOBACCO USE.  EXAM: BILATERAL CAROTID DUPLEX ULTRASOUND  TECHNIQUE: Wallace Cullens scale imaging, color Doppler and duplex ultrasound was performed of bilateral carotid and vertebral arteries in the neck.  COMPARISON:  None.  REVIEW OF SYSTEMS: Quantification of carotid stenosis is based on velocity parameters that correlate the residual internal carotid diameter with NASCET-based stenosis levels, using the diameter of the distal internal carotid lumen as the denominator for stenosis measurement.  The following velocity measurements were obtained:  PEAK SYSTOLIC/END DIASTOLIC  RIGHT  ICA:                     70/20cm/sec  CCA:                     81/13cm/sec  SYSTOLIC ICA/CCA RATIO:  0.86  DIASTOLIC ICA/CCA RATIO: 1.51  ECA:                     42cm/sec  LEFT  ICA:                     51/15cm/sec  CCA:                     79/14cm/sec  SYSTOLIC ICA/CCA RATIO:  0.65  DIASTOLIC ICA/CCA RATIO: 1.07  ECA:                     39cm/sec  FINDINGS: RIGHT CAROTID ARTERY: Mild intimal thickening in the common carotid artery. No focal plaque accumulation or stenosis. Normal waveforms and color Doppler signal. Poor visualization of the distal ICA.  RIGHT VERTEBRAL ARTERY:  Normal flow direction and waveform.  LEFT CAROTID ARTERY: Intimal thickening in the common carotid artery. No focal plaque accumulation or stenosis evident. Distal ICA mildly tortuous. Normal waveforms and color Doppler signal.  LEFT VERTEBRAL ARTERY: Normal flow direction and waveform.  IMPRESSION: No significant carotid bifurcation plaque or stenosis.   Electronically Signed   By: Oley Balm M.D.   On: 01/29/2013 15:24   Dg Chest Portable 1 View  01/29/2013   CLINICAL DATA:  Hyperglycemia and weakness.  EXAM: PORTABLE CHEST - 1 VIEW  COMPARISON:  Chest radiograph January 11, 2013  FINDINGS: Cardiomediastinal silhouette is unremarkable for this low inspiratory portable examination with crowded vasculature markings. The lungs are clear  without pleural effusions or focal consolidations. Trachea projects midline and  there is no pneumothorax. Included soft tissue planes and osseous structures are non-suspicious. Epidural stimulator leads project in mid thoracic spine. Multiple EKG lines overlie the patient and may obscure subtle underlying pathology. Surgical clips in the right abdomen consistent with cholecystectomy.  IMPRESSION: No acute cardiopulmonary process for this low inspiratory portable examination.   Electronically Signed   By: Awilda Metro   On: 01/29/2013 00:48   Mm Digital Screening  01/03/2013   *RADIOLOGY REPORT*  Clinical Data: Screening.  DIGITAL SCREENING BILATERAL MAMMOGRAM WITH CAD  Comparison:  None.  FINDINGS:  ACR Breast Density Category b:  There are scattered areas of fibroglandular density.  There are no findings suspicious for malignancy.  Images were processed with CAD.  IMPRESSION: No mammographic evidence of malignancy.  A result letter of this screening mammogram will be mailed directly to the patient.  RECOMMENDATION: Screening mammogram in one year. (Code:SM-B-01Y)  BI-RADS CATEGORY 1:  Negative.   Original Report Authenticated By: Baird Lyons, M.D.    Microbiology: No results found for this or any previous visit (from the past 240 hour(s)).   Labs: Basic Metabolic Panel:  Recent Labs Lab 01/29/13 0023 01/29/13 0059 01/30/13 0533  NA 135 140 142  K 4.1 4.2 3.8  CL 97 102 106  CO2 27  --  26  GLUCOSE 437* 442* 178*  BUN 18 18 8   CREATININE 0.96 1.00 0.79  CALCIUM 9.7  --  9.2   Liver Function Tests:  Recent Labs Lab 01/29/13 0023  AST 16  ALT 12  ALKPHOS 139*  BILITOT 0.2*  PROT 8.0  ALBUMIN 3.8   No results found for this basename: LIPASE, AMYLASE,  in the last 168 hours  Recent Labs Lab 01/29/13 1502  AMMONIA 27   CBC:  Recent Labs Lab 01/29/13 0023 01/29/13 0059  WBC 9.8  --   NEUTROABS 6.8  --   HGB 12.0 13.3  HCT 37.1 39.0  MCV 83.2  --   PLT 309  --     Cardiac Enzymes:  Recent Labs Lab 01/29/13 0023  TROPONINI <0.30   BNP: BNP (last 3 results) No results found for this basename: PROBNP,  in the last 8760 hours CBG:  Recent Labs Lab 01/30/13 2005 01/30/13 2356 01/31/13 0401 01/31/13 0748 01/31/13 1144  GLUCAP 147* 220* 166* 194* 327*       Signed:  MEMON,JEHANZEB  [facility] 01/31/2013, 2:53 PM

## 2013-01-31 NOTE — Care Management Note (Signed)
    Page 1 of 2   01/31/2013     4:24:16 PM  wasI  CARE MANAGEMENT NOTE 01/31/2013  Patient:  Rebecca Richards, Rebecca Richards   Account Number:  [id number]  Date Initiated:  01/31/2013  Documentation initiated by:  Anibal Henderson  Subjective/Objective Assessment:   pt is from home, lives with son, and will return home. She would benefit from [facility] RN to follow and she agrees with this.     Action/Plan:   Pt also has Richards CPAP that is broken and is having Richards sleep study soon to redo the CPAP settings to the appropriate settings for her now.  Per Dr Benetta Spar request, spoke with [facility], the patient will be able to get Richards new CPAP   Anticipated DC Date:  01/31/2013   Anticipated DC Plan:  HOME W HOME HEALTH SERVICES      DC Planning Services  CM consult      [facility] Choice  HOME HEALTH  DURABLE MEDICAL EQUIPMENT   Choice offered to / List presented to:  C-1 Patient   DME arranged  CPAP      DME agency  [facility]     HH arranged  HH-1 RN      [facility] agency  [facility]   Status of service:  Completed, signed off Medicare Important Message given?  YES (If response is "NO", the following Medicare IM given date fields will be blank) Date Medicare IM given:  01/31/2013 Date Additional Medicare IM given:    Discharge Disposition:  HOME W HOME HEALTH SERVICES  Per UR Regulation:    If discussed at Long Length of Stay Meetings, dates discussed:    Comments:  01/31/13 1600 Anibal Henderson RN/CM  with Richards written prescription, and will not have to wait she until  has Richards sleep study.

## 2013-01-31 NOTE — Progress Notes (Signed)
AVS reviewed with patient.  Prescriptions provided to patient for medication and home CPAP machine.  Pt verbalizes understanding of discharge instructions and physician follow-up.  Pt's IV removed.  Site WNL.  Pt reports all belongings intact and in possession at time of discharge.  Pt transported by NT via w/c to main entrance for discharge.  Pt stable at time of discharge.

## 2013-01-31 NOTE — Progress Notes (Signed)
Patient ID: [name], female   DOB: [birth year], 67 y.o.   MRN: [id number]  [facility] NEUROLOGY Stefannie Defeo A. Gerilyn Pilgrim, MD     [email or web address]          Bryson A Carrozza is an 67 y.o. female.   Assessment/Plan: She reports that she is scheduled for a well last night. She got a higher dose of the Thorazine and also uses her CPAP machine throughout the night. It appears that this combination may work best for the patient. She got 150 mg of Thorazine. Unfortunately, her CPAP machine is broken at home and may have been discarded. She will need to get arrangements in outpatient setting for a new machine.    Objective: Vital signs in last 24 hours: Temp:  [97.7 F (36.5 C)-98.2 F (36.8 C)] 97.8 F (36.6 C) (11/07 0546) Pulse Rate:  [84-97] 85 (11/07 0546) Resp:  [20] 20 (11/07 0546) BP: (118-172)/(79-102) 125/86 mmHg (11/07 0546) SpO2:  [99 %-100 %] 100 % (11/07 0546)  Intake/Output from previous day: 11/06 0701 - 11/07 0700 In: 720 [P.O.:720] Out: 2000 [Urine:2000] Intake/Output this shift:   Nutritional status: Carb Control   Lab Results: Results for orders placed during the hospital encounter of 01/29/13 (from the past 48 hour(s))  VITAMIN B12     Status: Abnormal   Collection Time    01/29/13  9:13 AM      Result Value Range   Vitamin B-12 1680 (*) 211 - 911 pg/mL   Comment: Performed at [facility]  GLUCOSE, CAPILLARY     Status: Abnormal   Collection Time    01/29/13 11:37 AM      Result Value Range   Glucose-Capillary 213 (*) 70 - 99 mg/dL   Comment 1 Notify RN    BLOOD GAS, ARTERIAL     Status: Abnormal   Collection Time    01/29/13  2:32 PM      Result Value Range   FIO2 0.21     pH, Arterial 7.384  7.350 - 7.450   pCO2 arterial 43.6  35.0 - 45.0 mmHg   pO2, Arterial 73.6 (*) 80.0 - 100.0 mmHg   Bicarbonate 25.4 (*) 20.0 - 24.0 mEq/L   TCO2 23.2  0 - 100 mmol/L   Acid-Base Excess 0.9  0.0 - 2.0 mmol/L   O2 Saturation 94.3     Patient  temperature 37.0     Collection site RIGHT RADIAL     Drawn by COLLECTED BY RT     Sample type ARTERIAL     Allens test (pass/fail) PASS  PASS  TSH     Status: None   Collection Time    01/29/13  2:59 PM      Result Value Range   TSH 0.545  0.350 - 4.500 uIU/mL   Comment: Performed at [facility]  AMMONIA     Status: None   Collection Time    01/29/13  3:02 PM      Result Value Range   Ammonia 27  11 - 60 umol/L  GLUCOSE, CAPILLARY     Status: Abnormal   Collection Time    01/29/13  5:05 PM      Result Value Range   Glucose-Capillary 229 (*) 70 - 99 mg/dL   Comment 1 Notify RN     Comment 2 Documented in Chart    GLUCOSE, CAPILLARY     Status: Abnormal   Collection Time    01/29/13  8:02 PM  Result Value Range   Glucose-Capillary 279 (*) 70 - 99 mg/dL  GLUCOSE, CAPILLARY     Status: Abnormal   Collection Time    01/29/13 11:59 PM      Result Value Range   Glucose-Capillary 187 (*) 70 - 99 mg/dL  GLUCOSE, CAPILLARY     Status: Abnormal   Collection Time    01/30/13  4:15 AM      Result Value Range   Glucose-Capillary 213 (*) 70 - 99 mg/dL   Comment 1 Notify RN    BASIC METABOLIC PANEL     Status: Abnormal   Collection Time    01/30/13  5:33 AM      Result Value Range   Sodium 142  135 - 145 mEq/L   Potassium 3.8  3.5 - 5.1 mEq/L   Chloride 106  96 - 112 mEq/L   CO2 26  19 - 32 mEq/L   Glucose, Bld 178 (*) 70 - 99 mg/dL   BUN 8  6 - 23 mg/dL   Creatinine, Ser 1.09  0.50 - 1.10 mg/dL   Calcium 9.2  8.4 - 60.4 mg/dL   GFR calc non Af Amer 86 (*) >90 mL/min   GFR calc Af Amer >90  >90 mL/min   Comment: (NOTE)     The eGFR has been calculated using the CKD EPI equation.     This calculation has not been validated in all clinical situations.     eGFR's persistently <90 mL/min signify possible Chronic Kidney     Disease.  GLUCOSE, CAPILLARY     Status: Abnormal   Collection Time    01/30/13  8:19 AM      Result Value Range   Glucose-Capillary 217 (*)  70 - 99 mg/dL   Comment 1 Notify RN    GLUCOSE, CAPILLARY     Status: Abnormal   Collection Time    01/30/13 11:17 AM      Result Value Range   Glucose-Capillary 309 (*) 70 - 99 mg/dL   Comment 1 Notify RN    GLUCOSE, CAPILLARY     Status: Abnormal   Collection Time    01/30/13  4:12 PM      Result Value Range   Glucose-Capillary 116 (*) 70 - 99 mg/dL   Comment 1 Notify RN     Comment 2 Documented in Chart    GLUCOSE, CAPILLARY     Status: Abnormal   Collection Time    01/30/13  8:05 PM      Result Value Range   Glucose-Capillary 147 (*) 70 - 99 mg/dL   Comment 1 Notify RN    GLUCOSE, CAPILLARY     Status: Abnormal   Collection Time    01/30/13 11:56 PM      Result Value Range   Glucose-Capillary 220 (*) 70 - 99 mg/dL   Comment 1 Notify RN    GLUCOSE, CAPILLARY     Status: Abnormal   Collection Time    01/31/13  4:01 AM      Result Value Range   Glucose-Capillary 166 (*) 70 - 99 mg/dL   Comment 1 Notify RN    GLUCOSE, CAPILLARY     Status: Abnormal   Collection Time    01/31/13  7:48 AM      Result Value Range   Glucose-Capillary 194 (*) 70 - 99 mg/dL   Comment 1 Notify RN      Lipid Panel  Recent Labs  01/29/13 0529  CHOL 154  TRIG 139  HDL 59  CHOLHDL 2.6  VLDL 28  LDLCALC 67    Studies/Results: Ct Head Wo Contrast  01/30/2013   CLINICAL DATA:  Followup for ataxia/dysarthria.  EXAM: CT HEAD WITHOUT CONTRAST  TECHNIQUE: Contiguous axial images were obtained from the base of the skull through the vertex without intravenous contrast.  COMPARISON:  01/29/2013 and 10/30/2004  FINDINGS: Ventricles, cisterns and other CSF spaces are within normal. There is mild the chronic ischemic microvascular disease present. There is no mass, mass effect, shift of midline structures or acute hemorrhage. There is no evidence to suggest acute infarction. Remainder of the exam is unchanged.  IMPRESSION: No acute intracranial findings.  Mild chronic ischemic microvascular disease.    Electronically Signed   By: Elberta Fortis M.D.   On: 01/30/2013 08:41   US Carotid Duplex Bilateral  01/29/2013   CLINICAL DATA:  STROKE, HYPERTENSION, DIABETES, TOBACCO USE.  EXAM: BILATERAL CAROTID DUPLEX ULTRASOUND  TECHNIQUE: Wallace Cullens scale imaging, color Doppler and duplex ultrasound was performed of bilateral carotid and vertebral arteries in the neck.  COMPARISON:  None.  REVIEW OF SYSTEMS: Quantification of carotid stenosis is based on velocity parameters that correlate the residual internal carotid diameter with NASCET-based stenosis levels, using the diameter of the distal internal carotid lumen as the denominator for stenosis measurement.  The following velocity measurements were obtained:  PEAK SYSTOLIC/END DIASTOLIC  RIGHT  ICA:                     70/20cm/sec  CCA:                     81/13cm/sec  SYSTOLIC ICA/CCA RATIO:  0.86  DIASTOLIC ICA/CCA RATIO: 1.51  ECA:                     42cm/sec  LEFT  ICA:                     51/15cm/sec  CCA:                     79/14cm/sec  SYSTOLIC ICA/CCA RATIO:  0.65  DIASTOLIC ICA/CCA RATIO: 1.07  ECA:                     39cm/sec  FINDINGS: RIGHT CAROTID ARTERY: Mild intimal thickening in the common carotid artery. No focal plaque accumulation or stenosis. Normal waveforms and color Doppler signal. Poor visualization of the distal ICA.  RIGHT VERTEBRAL ARTERY:  Normal flow direction and waveform.  LEFT CAROTID ARTERY: Intimal thickening in the common carotid artery. No focal plaque accumulation or stenosis evident. Distal ICA mildly tortuous. Normal waveforms and color Doppler signal.  LEFT VERTEBRAL ARTERY: Normal flow direction and waveform.  IMPRESSION: No significant carotid bifurcation plaque or stenosis.   Electronically Signed   By: Oley Balm M.D.   On: 01/29/2013 15:24    Medications:  Scheduled Meds: . aspirin  300 mg Rectal Daily   Or  . aspirin  325 mg Oral Daily  . chlorproMAZINE  150 mg Oral QHS  . enoxaparin (LOVENOX) injection  40 mg  Subcutaneous Q24H  . insulin aspart  0-9 Units Subcutaneous Q4H   Continuous Infusions:  PRN Meds:.acetaminophen, bisacodyl, ondansetron (ZOFRAN) IV, sodium phosphate, traMADol    LOS: 2 days   Brenna Friesenhahn A. Gerilyn Pilgrim, M.D.  Diplomate, Biomedical engineer of Psychiatry and Neurology ( Neurology).

## 2013-01-31 NOTE — Progress Notes (Signed)
  RD consulted for nutrition education regarding diabetes.   Pt reports DM diagnosis for 8 months. She eats Regular diet at home and is a self-reported "big" sweet eater. She frequently consumes candies and drinks high sugar smoothies.   Lab Results  Component Value Date   HGBA1C 11.4* 01/29/2013    RD provided "Carbohydrate Counting for People with Diabetes" handout from the Academy of Nutrition and Dietetics. Discussed different food groups and their effects on blood sugar, emphasizing carbohydrate-containing foods. Provided list of carbohydrates and recommended serving sizes of common foods.  Discussed importance of controlled and consistent carbohydrate intake throughout the day. Provided examples of ways to balance meals/snacks and encouraged intake of high-fiber, whole grain complex carbohydrates. Teach back method used.  Expect good compliance.  Body mass index is 32.86 kg/(m^2). Pt meets criteria for obesity class I based on current BMI.  Current diet order is CHO Modified (1600-2000 kcal/day), patient is consuming approximately 100% of meals at this time. Labs and medications reviewed. RD contact Cathie Hoops) information provided. Encouraged pt to attend free diabetes class here after discharge.  Rebecca Shivers MS,RD,CSG,LDN Office: [phone] Pager: [phone]

## 2013-01-31 NOTE — Progress Notes (Signed)
Patient complained that CPAP was blowing out cool air and not warm air.  Respiratory was notified and made adjustments to machine.  CPAP was placed back on patient.  Upon further assessment, patient was found not wearing CPAP and was continuing to complain.  Respiratory was again notified.  Placed patient on 2L LPM nasal cannula.  Will continue to monitor patient.   Ellene Route 01/31/2013

## 2013-02-05 ENCOUNTER — Emergency Department (HOSPITAL_COMMUNITY)
Admission: EM | Admit: 2013-02-05 | Discharge: 2013-02-05 | Disposition: A | Discharge status: Home / Self Care | Payer: Medicare Other | Attending: Emergency Medicine | Admitting: Emergency Medicine

## 2013-02-05 ENCOUNTER — Emergency Department (HOSPITAL_COMMUNITY): Payer: Medicare Other

## 2013-02-05 ENCOUNTER — Encounter (HOSPITAL_COMMUNITY): Payer: Self-pay | Admitting: Emergency Medicine

## 2013-02-05 DIAGNOSIS — Z85528 Personal history of other malignant neoplasm of kidney: Secondary | ICD-10-CM | POA: Insufficient documentation

## 2013-02-05 DIAGNOSIS — Z7982 Long term (current) use of aspirin: Secondary | ICD-10-CM | POA: Insufficient documentation

## 2013-02-05 DIAGNOSIS — I10 Essential (primary) hypertension: Secondary | ICD-10-CM | POA: Insufficient documentation

## 2013-02-05 DIAGNOSIS — S20229A Contusion of unspecified back wall of thorax, initial encounter: Secondary | ICD-10-CM | POA: Insufficient documentation

## 2013-02-05 DIAGNOSIS — Z8619 Personal history of other infectious and parasitic diseases: Secondary | ICD-10-CM | POA: Insufficient documentation

## 2013-02-05 DIAGNOSIS — Y939 Activity, unspecified: Secondary | ICD-10-CM | POA: Insufficient documentation

## 2013-02-05 DIAGNOSIS — Z79899 Other long term (current) drug therapy: Secondary | ICD-10-CM | POA: Insufficient documentation

## 2013-02-05 DIAGNOSIS — E119 Type 2 diabetes mellitus without complications: Secondary | ICD-10-CM | POA: Insufficient documentation

## 2013-02-05 DIAGNOSIS — Z87891 Personal history of nicotine dependence: Secondary | ICD-10-CM | POA: Insufficient documentation

## 2013-02-05 DIAGNOSIS — W19XXXA Unspecified fall, initial encounter: Secondary | ICD-10-CM | POA: Insufficient documentation

## 2013-02-05 DIAGNOSIS — Y929 Unspecified place or not applicable: Secondary | ICD-10-CM | POA: Insufficient documentation

## 2013-02-05 DIAGNOSIS — G8929 Other chronic pain: Secondary | ICD-10-CM | POA: Insufficient documentation

## 2013-02-05 DIAGNOSIS — Z872 Personal history of diseases of the skin and subcutaneous tissue: Secondary | ICD-10-CM | POA: Insufficient documentation

## 2013-02-05 DIAGNOSIS — S300XXA Contusion of lower back and pelvis, initial encounter: Secondary | ICD-10-CM

## 2013-02-05 DIAGNOSIS — Z8669 Personal history of other diseases of the nervous system and sense organs: Secondary | ICD-10-CM | POA: Insufficient documentation

## 2013-02-05 DIAGNOSIS — M129 Arthropathy, unspecified: Secondary | ICD-10-CM | POA: Insufficient documentation

## 2013-02-05 DIAGNOSIS — Z8659 Personal history of other mental and behavioral disorders: Secondary | ICD-10-CM | POA: Insufficient documentation

## 2013-02-05 LAB — GLUCOSE, CAPILLARY: Glucose-Capillary: 162 mg/dL — ABNORMAL HIGH (ref 70–99)

## 2013-02-05 MED ORDER — OXYCODONE-ACETAMINOPHEN 5-325 MG PO TABS
1.0000 | ORAL_TABLET | Freq: Once | ORAL | Status: AC
  Administered 2013-02-05: 1 via ORAL
  Filled 2013-02-05: qty 1

## 2013-02-05 MED ORDER — OXYCODONE-ACETAMINOPHEN 5-325 MG PO TABS: 1.0000 | ORAL_TABLET | ORAL | Status: DC | PRN

## 2013-02-05 NOTE — ED Notes (Signed)
Pt c/o tailbone pain since she has had 3 falls over last couple of weeks, pt sitting on side of bed, admits to taking OTC meds for pain with last dose this morning and per pt took [facility] powder

## 2013-02-05 NOTE — ED Notes (Signed)
Pt reports falling on her tail bone several times last week.  Pt reports that her blood sugar was low at the time and she was dizzy.  Pt reports severe pain to her tail bone area.  Pt denies hitting her head or any LOC.

## 2013-02-06 ENCOUNTER — Telehealth (HOSPITAL_COMMUNITY): Payer: Self-pay | Admitting: Dietician

## 2013-02-06 NOTE — Telephone Encounter (Signed)
Received referral from [facility] inpatient RD for for outpatient diabetes education.

## 2013-02-06 NOTE — Telephone Encounter (Signed)
Sent letter to pt home via [place] Mail in attempt to contact pt to schedule appointment. Also provided information on outpatient diabetes education classes.

## 2013-02-07 NOTE — ED Provider Notes (Signed)
Medical screening examination/treatment/procedure(s) were performed by non-physician practitioner and as supervising physician I was immediately available for consultation/collaboration.  EKG Interpretation   None         Glynn Octave, MD 02/07/13 2121

## 2013-02-07 NOTE — ED Provider Notes (Signed)
CSN: [id number]     Arrival date & time 02/05/13  1605 History   First MD Initiated Contact with Patient 02/05/13 1638     Chief Complaint  Patient presents with  . Fall   (Consider location/radiation/quality/duration/timing/severity/associated sxs/prior Treatment) HPI Comments: Rebecca Richards is a 67 y.o. female who presents to the Emergency Department complaining of tailbone pain for one week. Patient states that she was admitted to this hospital last week for episodes of high blood sugar and dizziness. She states that she was admitted for  a possible stroke but she states it was later determined that her symptoms were related to her blood sugar.  She states that she was having pain to her tailbone during her hospital stay but the pain was never addressed by her physician. She denies any abdominal pain, dizziness, syncope, dysuria, bladder or bowel incontinence, saddle anesthesia's or lower extremity numbness or weakness.  Patient's PCP is Dr. Sherryll Burger in [place]  The history is provided by the patient.    Past Medical History  Diagnosis Date  . Brain tumor     Surgery for benign brain tumor in the remote past  . Hypertension   . Preop cardiovascular exam     Cardiac clearance for knee surgery, January, 2013  . Edema   . Tobacco abuse   . Ejection fraction     EF 70%, echo, February, 2013  . Sleep apnea     uses Cpap  . Hepatitis 1970's    hep c  . Anxiety   . 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 Nasal congestion   . Diabetes mellitus without complication   . Insomnia   . 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 Spinal cord stimulator status     for pain   . Chronic knee pain   . Chronic back pain     with radiculopathy bilat legs  . Depression   . 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 Arthritis    Past Surgical History  Procedure Laterality Date  . Abdominal hysterectomy    . Knee surgery    . Nose surgery    . Back surgery      pt  has stimulator in lower back and can't have an MRI  . Total knee arthroplasty  07/21/2011    Procedure: TOTAL KNEE ARTHROPLASTY;  Surgeon: Thera Flake., MD;  Location: [facility] OR;  Service: Orthopedics;  Laterality: Left;  . 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 Cholecystectomy N/A 09/30/2012    Procedure: LAPAROSCOPIC CHOLECYSTECTOMY;  Surgeon: Fabio Bering, MD;  Location: [facility] ORS;  Service: General;  Laterality: N/A;   Family History  Problem Relation Age of Onset  . Cancer Mother   . Hypertension Mother   . Anesthesia problems Neg Hx   . Diabetes Mother   . Diabetes Brother    History  Substance Use Topics  . Smoking status: Former Smoker -- 0.50 packs/day for 10 years    Types: Cigarettes    Quit date: 02/19/2012  . Smokeless tobacco: Never Used  . Alcohol Use: No     Comment: none since 03-28-2011   OB History   Grav Para Term Preterm Abortions TAB SAB Ect Mult Living   1 1 1       1      Review of Systems  Constitutional: Negative for fever and  chills.  Eyes: Negative for visual disturbance.  Respiratory: Negative for shortness of breath.   Cardiovascular: Negative for chest pain.  Genitourinary: Negative for dysuria and difficulty urinating.  Musculoskeletal: Positive for back pain. Negative for arthralgias, joint swelling, neck pain and neck stiffness.  Skin: Negative for color change and wound.  Neurological: Negative for dizziness, syncope, facial asymmetry, speech difficulty, weakness, light-headedness, numbness and headaches.  All other systems reviewed and are negative.    Allergies  Review of patient's allergies indicates no known allergies.  Home Medications   Current Outpatient Rx  Name  Route  Sig  Dispense  Refill  . amLODipine (NORVASC) 10 MG tablet   Oral   Take 10 mg by mouth daily.         Marland Kitchen Apoaequorin (PREVAGEN PO)   Oral   Take 1 tablet by mouth daily.         Marland Kitchen aspirin EC 81 MG tablet   Oral   Take 1 tablet (81 mg total) by  mouth daily.   30 tablet   1   . Aspirin-Salicylamide-Caffeine (BC HEADACHE) 325-95-16 MG TABS   Oral   Take 1 packet by mouth daily as needed (for pain).         . chlorproMAZINE (THORAZINE) 50 MG tablet   Oral   Take 3 tablets (150 mg total) by mouth at bedtime.   21 tablet   0   . Coenzyme Q10 (CO Q 10 PO)   Oral   Take 1 tablet by mouth 2 (two) times daily.         Marland Kitchen esomeprazole (NEXIUM) 40 MG capsule   Oral   Take 40 mg by mouth daily as needed (for acid reflux).          Marland Kitchen glipiZIDE (GLUCOTROL) 5 MG tablet   Oral   Take 1 tablet (5 mg total) by mouth 2 (two) times daily before a meal.   60 tablet   0   . JANUVIA 100 MG tablet   Oral   Take 1 tablet by mouth daily.         Marland Kitchen lisinopril (PRINIVIL,ZESTRIL) 10 MG tablet   Oral   Take 10 mg by mouth daily.         . naproxen (NAPROSYN) 500 MG tablet   Oral   Take 1 tablet by mouth as needed for mild pain, moderate pain or headache.          . naproxen sodium (ALEVE) 220 MG tablet   Oral   Take 220 mg by mouth daily as needed (for pain).         Marland Kitchen oxyCODONE-acetaminophen (PERCOCET/ROXICET) 5-325 MG per tablet   Oral   Take 1 tablet by mouth every 4 (four) hours as needed for severe pain.   15 tablet   0    BP 124/94  Pulse 100  Temp(Src) 98 F (36.7 C) (Oral)  Resp 20  Ht 5\' 1"  (1.549 m)  Wt 176 lb (79.833 kg)  BMI 33.27 kg/m2  SpO2 98% Physical Exam  Nursing note and vitals reviewed. Constitutional: She is oriented to person, place, and time. She appears well-developed and well-nourished. No distress.  HENT:  Head: Normocephalic and atraumatic.  Neck: Normal range of motion. Neck supple.  Cardiovascular: Normal rate, regular rhythm, normal heart sounds and intact distal pulses.   No murmur heard. Pulmonary/Chest: Effort normal and breath sounds normal. No respiratory distress.  Abdominal: Soft. She exhibits no distension. There is  no tenderness.  Musculoskeletal: She exhibits  tenderness. She exhibits no edema.       Lumbar back: She exhibits tenderness, bony tenderness and pain. She exhibits normal range of motion, no swelling, no edema, no deformity, no laceration and normal pulse.       Back:  Localized tenderness to palpation over the coccyx  No upper spinal tenderness.  DP pulses are brisk and symmetrical.  Distal sensation intact.  Hip Flexors/Extensors are intact  Neurological: She is alert and oriented to person, place, and time. She has normal strength. No sensory deficit. She exhibits normal muscle tone. Coordination and gait normal.  Reflex Scores:      Patellar reflexes are 2+ on the right side and 2+ on the left side.      Achilles reflexes are 2+ on the right side and 2+ on the left side. Skin: Skin is warm and dry. No rash noted.    ED Course  Procedures (including critical care time) Labs Review Labs Reviewed  GLUCOSE, CAPILLARY - Abnormal; Notable for the following:    Glucose-Capillary 162 (*)    All other components within normal limits   Imaging Review Dg Sacrum/coccyx  02/05/2013   CLINICAL DATA:  Recent fall with buttock pain  EXAM: SACRUM AND COCCYX - 2+ VIEW  COMPARISON:  None.  FINDINGS: The pelvic ring is intact. The sacral ale are within normal limits. Postsurgical changes are noted at L4-5 with residual anterolisthesis of L4 on L5 noted. No other focal abnormality is seen.  IMPRESSION: No acute abnormality noted.   Electronically Signed   By: Alcide Clever M.D.   On: 02/05/2013 18:13    EKG Interpretation   None       MDM   1. Coccyx contusion, initial encounter    Vital signs are stable. Patient is well appearing. Ambulates with a steady gait. No focal neuro deficits on exam. No concerning symptoms for emergent neurological infectious process  Previous ED chart, hospital admission and imaging was reviewed by me. Patient's care was also discussed with the EDP Dr. Manus Gunning.  Patient's complaint of tailbone pain were results of  the falls that were incurred prior to her recent hospital admission. She denies any hypo-or hyper glycemic events, dizziness or recent trauma since her admission.  I have discussed x-ray findings with the patient including possibility of occult fracture. Patient verbalized understanding. She agrees to symptomatic treatment and close followup with her primary care physician next week. She appears stable for discharge.     Waverly Chavarria L. Trisha Mangle, PA-C 02/07/13 1610

## 2013-02-11 NOTE — Telephone Encounter (Deleted)
No response to previous contact attempts. Sent letter to pt home via [place] Mail in attempt to contact pt to schedule appointment.

## 2013-02-11 NOTE — Telephone Encounter (Signed)
No response to contact attempt. Referral filed.

## 2013-06-03 ENCOUNTER — Encounter (HOSPITAL_COMMUNITY): Payer: Self-pay | Admitting: Emergency Medicine

## 2013-06-03 ENCOUNTER — Emergency Department (HOSPITAL_COMMUNITY): Payer: Medicare Other

## 2013-06-03 ENCOUNTER — Emergency Department (HOSPITAL_COMMUNITY)
Admission: EM | Admit: 2013-06-03 | Discharge: 2013-06-03 | Disposition: A | Discharge status: Home / Self Care | Payer: Medicare Other | Attending: Emergency Medicine | Admitting: Emergency Medicine

## 2013-06-03 DIAGNOSIS — R531 Weakness: Secondary | ICD-10-CM

## 2013-06-03 DIAGNOSIS — M129 Arthropathy, unspecified: Secondary | ICD-10-CM | POA: Insufficient documentation

## 2013-06-03 DIAGNOSIS — Z7982 Long term (current) use of aspirin: Secondary | ICD-10-CM | POA: Insufficient documentation

## 2013-06-03 DIAGNOSIS — I10 Essential (primary) hypertension: Secondary | ICD-10-CM | POA: Insufficient documentation

## 2013-06-03 DIAGNOSIS — Z79899 Other long term (current) drug therapy: Secondary | ICD-10-CM | POA: Insufficient documentation

## 2013-06-03 DIAGNOSIS — F3289 Other specified depressive episodes: Secondary | ICD-10-CM | POA: Insufficient documentation

## 2013-06-03 DIAGNOSIS — R5383 Other fatigue: Principal | ICD-10-CM

## 2013-06-03 DIAGNOSIS — F32A Depression, unspecified: Secondary | ICD-10-CM

## 2013-06-03 DIAGNOSIS — Z85528 Personal history of other malignant neoplasm of kidney: Secondary | ICD-10-CM | POA: Insufficient documentation

## 2013-06-03 DIAGNOSIS — G8929 Other chronic pain: Secondary | ICD-10-CM | POA: Insufficient documentation

## 2013-06-03 DIAGNOSIS — G4730 Sleep apnea, unspecified: Secondary | ICD-10-CM | POA: Insufficient documentation

## 2013-06-03 DIAGNOSIS — Z87448 Personal history of other diseases of urinary system: Secondary | ICD-10-CM | POA: Insufficient documentation

## 2013-06-03 DIAGNOSIS — Z8719 Personal history of other diseases of the digestive system: Secondary | ICD-10-CM | POA: Insufficient documentation

## 2013-06-03 DIAGNOSIS — Z872 Personal history of diseases of the skin and subcutaneous tissue: Secondary | ICD-10-CM | POA: Insufficient documentation

## 2013-06-03 DIAGNOSIS — R5381 Other malaise: Secondary | ICD-10-CM | POA: Insufficient documentation

## 2013-06-03 DIAGNOSIS — Z9889 Other specified postprocedural states: Secondary | ICD-10-CM | POA: Insufficient documentation

## 2013-06-03 DIAGNOSIS — E119 Type 2 diabetes mellitus without complications: Secondary | ICD-10-CM | POA: Insufficient documentation

## 2013-06-03 DIAGNOSIS — F411 Generalized anxiety disorder: Secondary | ICD-10-CM | POA: Insufficient documentation

## 2013-06-03 DIAGNOSIS — F329 Major depressive disorder, single episode, unspecified: Secondary | ICD-10-CM | POA: Insufficient documentation

## 2013-06-03 DIAGNOSIS — F419 Anxiety disorder, unspecified: Secondary | ICD-10-CM

## 2013-06-03 DIAGNOSIS — Z87891 Personal history of nicotine dependence: Secondary | ICD-10-CM | POA: Insufficient documentation

## 2013-06-03 LAB — BASIC METABOLIC PANEL
BUN: 18 mg/dL (ref 6–23)
CALCIUM: 10 mg/dL (ref 8.4–10.5)
CO2: 25 meq/L (ref 19–32)
CREATININE: 0.95 mg/dL (ref 0.50–1.10)
Chloride: 103 mEq/L (ref 96–112)
GFR calc Af Amer: 71 mL/min — ABNORMAL LOW (ref >90)
GFR calc non Af Amer: 62 mL/min — ABNORMAL LOW (ref >90)
Glucose, Bld: 122 mg/dL — ABNORMAL HIGH (ref 70–99)
Potassium: 3.6 mEq/L — ABNORMAL LOW (ref 3.7–5.3)
Sodium: 145 mEq/L (ref 137–147)

## 2013-06-03 LAB — URINALYSIS, ROUTINE W REFLEX MICROSCOPIC
Bilirubin Urine: NEGATIVE
KETONES UR: NEGATIVE mg/dL
LEUKOCYTES UA: NEGATIVE
Nitrite: NEGATIVE
PROTEIN: NEGATIVE mg/dL
Specific Gravity, Urine: 1.01 (ref 1.005–1.030)
Urobilinogen, UA: 0.2 mg/dL (ref 0.0–1.0)
pH: 5.5 (ref 5.0–8.0)

## 2013-06-03 LAB — CBC WITH DIFFERENTIAL/PLATELET
Basophils Absolute: 0 10*3/uL (ref 0.0–0.1)
Basophils Relative: 0 % (ref 0–1)
EOS PCT: 5 % (ref 0–5)
Eosinophils Absolute: 0.4 10*3/uL (ref 0.0–0.7)
HEMATOCRIT: 40 % (ref 36.0–46.0)
Hemoglobin: 12.9 g/dL (ref 12.0–15.0)
LYMPHS ABS: 1.7 10*3/uL (ref 0.7–4.0)
LYMPHS PCT: 19 % (ref 12–46)
MCH: 27.2 pg (ref 26.0–34.0)
MCHC: 32.3 g/dL (ref 30.0–36.0)
MCV: 84.2 fL (ref 78.0–100.0)
MONO ABS: 0.7 10*3/uL (ref 0.1–1.0)
Monocytes Relative: 8 % (ref 3–12)
Neutro Abs: 6.2 10*3/uL (ref 1.7–7.7)
Neutrophils Relative %: 68 % (ref 43–77)
Platelets: 319 10*3/uL (ref 150–400)
RBC: 4.75 MIL/uL (ref 3.87–5.11)
RDW: 14.3 % (ref 11.5–15.5)
WBC: 9.1 10*3/uL (ref 4.0–10.5)

## 2013-06-03 LAB — URINE MICROSCOPIC-ADD ON

## 2013-06-03 LAB — HEPATIC FUNCTION PANEL
ALK PHOS: 109 U/L (ref 39–117)
ALT: 16 U/L (ref 0–35)
AST: 21 U/L (ref 0–37)
Albumin: 3.7 g/dL (ref 3.5–5.2)
Total Protein: 8.7 g/dL — ABNORMAL HIGH (ref 6.0–8.3)

## 2013-06-03 LAB — RAPID URINE DRUG SCREEN, HOSP PERFORMED
AMPHETAMINES: NOT DETECTED
Barbiturates: NOT DETECTED
Benzodiazepines: POSITIVE — AB
Cocaine: NOT DETECTED
OPIATES: NOT DETECTED
Tetrahydrocannabinol: NOT DETECTED

## 2013-06-03 LAB — CBG MONITORING, ED: Glucose-Capillary: 111 mg/dL — ABNORMAL HIGH (ref 70–99)

## 2013-06-03 LAB — CK: Total CK: 71 U/L (ref 7–177)

## 2013-06-03 LAB — ETHANOL: Alcohol, Ethyl (B): <11 mg/dL (ref 0–11)

## 2013-06-03 LAB — TROPONIN I: Troponin I: <0.3 ng/mL (ref <0.30)

## 2013-06-03 MED ORDER — DIAZEPAM 5 MG PO TABS: 5.0000 mg | ORAL_TABLET | Freq: Four times a day (QID) | ORAL | Status: DC | PRN

## 2013-06-03 MED ORDER — BUPROPION HCL ER (XL) 150 MG PO TB24: 150.0000 mg | ORAL_TABLET | Freq: Every day | ORAL | Status: DC

## 2013-06-03 NOTE — Discharge Instructions (Signed)
Depression, Adult Depression refers to feeling sad, low, down in the dumps, blue, gloomy, or empty. In general, there are two kinds of depression: 1. Depression that we all experience from time to time because of upsetting life experiences, including the loss of a job or the ending of a relationship (normal sadness or normal grief). This kind of depression is considered normal, is short lived, and resolves within a few days to 2 weeks. (Depression experienced after the loss of a loved one is called bereavement. Bereavement often lasts longer than 2 weeks but normally gets better with time.) 2. Clinical depression, which lasts longer than normal sadness or normal grief or interferes with your ability to function at home, at work, and in school. It also interferes with your personal relationships. It affects almost every aspect of your life. Clinical depression is an illness. Symptoms of depression also can be caused by conditions other than normal sadness and grief or clinical depression. Examples of these conditions are listed as follows:  Physical illness Some physical illnesses, including underactive thyroid gland (hypothyroidism), severe anemia, specific types of cancer, diabetes, uncontrolled seizures, heart and lung problems, strokes, and chronic pain are commonly associated with symptoms of depression.  Side effects of some prescription medicine In some people, certain types of prescription medicine can cause symptoms of depression.  Substance abuse Abuse of alcohol and illicit drugs can cause symptoms of depression. SYMPTOMS Symptoms of normal sadness and normal grief include the following:  Feeling sad or crying for short periods of time.  Not caring about anything (apathy).  Difficulty sleeping or sleeping too much.  No longer able to enjoy the things you used to enjoy.  Desire to be by oneself all the time (social isolation).  Lack of energy or motivation.  Difficulty  concentrating or remembering.  Change in appetite or weight.  Restlessness or agitation. Symptoms of clinical depression include the same symptoms of normal sadness or normal grief and also the following symptoms:  Feeling sad or crying all the time.  Feelings of guilt or worthlessness.  Feelings of hopelessness or helplessness.  Thoughts of suicide or the desire to harm yourself (suicidal ideation).  Loss of touch with reality (psychotic symptoms). Seeing or hearing things that are not real (hallucinations) or having false beliefs about your life or the people around you (delusions and paranoia). DIAGNOSIS  The diagnosis of clinical depression usually is based on the severity and duration of the symptoms. Your caregiver also will ask you questions about your medical history and substance use to find out if physical illness, use of prescription medicine, or substance abuse is causing your depression. Your caregiver also may order blood tests. TREATMENT  Typically, normal sadness and normal grief do not require treatment. However, sometimes antidepressant medicine is prescribed for bereavement to ease the depressive symptoms until they resolve. The treatment for clinical depression depends on the severity of your symptoms but typically includes antidepressant medicine, counseling with a mental health professional, or a combination of both. Your caregiver will help to determine what treatment is best for you. Depression caused by physical illness usually goes away with appropriate medical treatment of the illness. If prescription medicine is causing depression, talk with your caregiver about stopping the medicine, decreasing the dose, or substituting another medicine. Depression caused by abuse of alcohol or illicit drugs abuse goes away with abstinence from these substances. Some adults need professional help in order to stop drinking or using drugs. SEEK IMMEDIATE CARE IF:  You have  thoughts  about hurting yourself or others.  You lose touch with reality (have psychotic symptoms).  You are taking medicine for depression and have a serious side effect. FOR MORE INFORMATION National Alliance on Mental Illness: www.nami.[employer]: https://carter.com/ Document Released: 03/10/2000 Document Revised: 09/12/2011 Document Reviewed: 06/12/2011 [facility] Patient Information 2014 [place]. Generalized Anxiety Disorder Generalized anxiety disorder (GAD) is a mental disorder. It interferes with life functions, including relationships, work, and school. GAD is different from normal anxiety, which everyone experiences at some point in their lives in response to specific life events and activities. Normal anxiety actually helps [place] prepare for and get through these life events and activities. Normal anxiety goes away after the event or activity is over.  GAD causes anxiety that is not necessarily related to specific events or activities. It also causes excess anxiety in proportion to specific events or activities. The anxiety associated with GAD is also difficult to control. GAD can vary from mild to severe. People with severe GAD can have intense waves of anxiety with physical symptoms (panic attacks).  SYMPTOMS The anxiety and worry associated with GAD are difficult to control. This anxiety and worry are related to many life events and activities and also occur more days than not for 6 months or longer. People with GAD also have three or more of the following symptoms (one or more in children):  Restlessness.   Fatigue.  Difficulty concentrating.   Irritability.  Muscle tension.  Difficulty sleeping or unsatisfying sleep. DIAGNOSIS GAD is diagnosed through an assessment by your caregiver. Your caregiver will ask you questions aboutyour mood,physical symptoms, and events in your life. Your caregiver may ask you about your medical history and use of  alcohol or drugs, including prescription medications. Your caregiver may also do a physical exam and blood tests. Certain medical conditions and the use of certain substances can cause symptoms similar to those associated with GAD. Your caregiver may refer you to a mental health specialist for further evaluation. TREATMENT The following therapies are usually used to treat GAD:   Medication Antidepressant medication usually is prescribed for long-term daily control. Antianxiety medications may be added in severe cases, especially when panic attacks occur.   Talk therapy (psychotherapy) Certain types of talk therapy can be helpful in treating GAD by providing support, education, and guidance. A form of talk therapy called cognitive behavioral therapy can teach you healthy ways to think about and react to daily life events and activities.  Stress managementtechniques These include yoga, meditation, and exercise and can be very helpful when they are practiced regularly. A mental health specialist can help determine which treatment is best for you. Some people see improvement with one therapy. However, other people require a combination of therapies. Document Released: 07/08/2012 Document Reviewed: 07/08/2012 [facility] Patient Information 2014 [place], [place].

## 2013-06-03 NOTE — ED Provider Notes (Signed)
CSN: [id number]     Arrival date & time 06/03/13  1144 History  This chart was scribed for Orpah Greek, MD by Roxan Diesel, ED scribe.  This patient was seen in room APA10/APA10 and the patient's care was started at 1:55 PM.   Chief Complaint  Patient presents with  . Fatigue    The history is provided by the patient. No language interpreter was used.    HPI Comments: Rebecca Richards is a 68 y.o. female with h/o sleep apnea, DM, HTN, hepatitis C, depression, anxiety, insomnia, brain tumor, and arthritis who presents to the Emergency Department complaining of one month of progressively-worsening generalized weakness and fatigue.  Pt reports she has talked to her doctor and was placed on Xanax but she has continued to feel poorly.  She is not eating or sleeping well.  She also complains of diarrhea which is not typical for her.  She also notes some pain in her upper legs bilaterally.  She denies abdominal pain, changes to her breathing, or black or bloody stools.     Past Medical History  Diagnosis Date  . Brain tumor     Surgery for benign brain tumor in the remote past  . Hypertension   . Preop cardiovascular exam     Cardiac clearance for knee surgery, January, 2013  . Edema   . Tobacco abuse   . Ejection fraction     EF 70%, echo, February, 2013  . Sleep apnea     uses Cpap  . Hepatitis 1970's    hep c  . Anxiety   . 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 Nasal congestion   . Diabetes mellitus without complication   . Insomnia   . 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 Spinal cord stimulator status     for pain   . Chronic knee pain   . Chronic back pain     with radiculopathy bilat legs  . Depression   . 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 Arthritis     Past Surgical History  Procedure Laterality Date  . Abdominal hysterectomy    . Knee surgery    . Nose surgery    . Back surgery      pt has stimulator in lower  back and can't have an MRI  . Total knee arthroplasty  07/21/2011    Procedure: TOTAL KNEE ARTHROPLASTY;  Surgeon: Yvette Rack., MD;  Location: [place];  Service: Orthopedics;  Laterality: Left;  . 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 Cholecystectomy N/A 09/30/2012    Procedure: LAPAROSCOPIC CHOLECYSTECTOMY;  Surgeon: Donato Heinz, MD;  Location: [facility];  Service: General;  Laterality: N/A;    Family History  Problem Relation Age of Onset  . Cancer Mother   . Hypertension Mother   . Anesthesia problems Neg Hx   . Diabetes Mother   . Diabetes Brother     History  Substance Use Topics  . Smoking status: Former Smoker -- 0.50 packs/day for 10 years    Types: Cigarettes    Quit date: 02/19/2012  . Smokeless tobacco: Never Used  . Alcohol Use: No     Comment: none since 03-28-2011    OB History   Grav Para Term Preterm Abortions TAB SAB Ect Mult Living   1 1 1  1       Review of Systems  Constitutional: Positive for activity change and fatigue.  Respiratory: Negative for shortness of breath.   Gastrointestinal: Negative for abdominal pain and blood in stool.  Musculoskeletal: Positive for arthralgias.  Neurological: Positive for weakness.  Psychiatric/Behavioral: Positive for sleep disturbance.  All other systems reviewed and are negative.      Allergies  Review of patient's allergies indicates no known allergies.  Home Medications   Current Outpatient Rx  Name  Route  Sig  Dispense  Refill  . ALPRAZolam (XANAX) 1 MG tablet   Oral   Take 0.5-1 mg by mouth 2 (two) times daily. Patient takes 1 tablet during the day then 1/2 tablet at night         . amLODipine (NORVASC) 5 MG tablet   Oral   Take 5 mg by mouth daily.         Marland Kitchen Apoaequorin (PREVAGEN PO)   Oral   Take 1 tablet by mouth daily.         Marland Kitchen aspirin EC 81 MG tablet   Oral   Take 1 tablet (81 mg total) by mouth daily.   30 tablet   1   . Aspirin-Salicylamide-Caffeine (BC  HEADACHE) 325-95-16 MG TABS   Oral   Take 1 packet by mouth daily as needed (for pain).         . Canagliflozin (INVOKANA) 100 MG TABS   Oral   Take 100 mg by mouth daily.         . chlorproMAZINE (THORAZINE) 50 MG tablet   Oral   Take 3 tablets (150 mg total) by mouth at bedtime.   21 tablet   0   . Coenzyme Q10 (CO Q 10 PO)   Oral   Take 1 tablet by mouth 2 (two) times daily.         Marland Kitchen lisinopril (PRINIVIL,ZESTRIL) 20 MG tablet   Oral   Take 20 mg by mouth daily.         . metFORMIN (GLUCOPHAGE) 500 MG tablet   Oral   Take 500 mg by mouth 2 (two) times daily.         . naproxen sodium (ALEVE) 220 MG tablet   Oral   Take 220 mg by mouth daily as needed (for pain).         Marland Kitchen oxyCODONE-acetaminophen (PERCOCET) 7.5-325 MG per tablet   Oral   Take 1 tablet by mouth every 4 (four) hours as needed for pain.         Marland Kitchen buPROPion (WELLBUTRIN XL) 150 MG 24 hr tablet   Oral   Take 1 tablet (150 mg total) by mouth daily.   30 tablet   0   . diazepam (VALIUM) 5 MG tablet   Oral   Take 1 tablet (5 mg total) by mouth every 6 (six) hours as needed for anxiety (spasms).   15 tablet   0    BP 135/73  Pulse 100  Temp(Src) 97.2 F (36.2 C) (Oral)  Resp 18  Ht 5\' 1"  (1.549 m)  Wt 175 lb (79.379 kg)  BMI 33.08 kg/m2  SpO2 100%  Physical Exam  Nursing note and vitals reviewed. Constitutional: She is oriented to person, place, and time. She appears well-developed and well-nourished. No distress.  HENT:  Head: Normocephalic and atraumatic.  Right Ear: Hearing normal.  Left Ear: Hearing normal.  Nose: Nose normal.  Mouth/Throat: Oropharynx is clear and moist and  mucous membranes are normal.  Eyes: Conjunctivae and EOM are normal. Pupils are equal, round, and reactive to light.  Neck: Normal range of motion. Neck supple.  Cardiovascular: Normal rate, regular rhythm, S1 normal, S2 normal and normal heart sounds.  Exam reveals no gallop and no friction rub.   No  murmur heard. Pulmonary/Chest: Effort normal and breath sounds normal. No respiratory distress. She has no wheezes. She has no rales. She exhibits no tenderness.  Abdominal: Soft. Normal appearance and bowel sounds are normal. There is no hepatosplenomegaly. There is no tenderness. There is no rebound, no guarding, no tenderness at McBurney's point and negative Murphy's sign. No hernia.  Musculoskeletal: Normal range of motion.  Neurological: She is alert and oriented to person, place, and time. She has normal strength. No cranial nerve deficit or sensory deficit. Coordination normal. GCS eye subscore is 4. GCS verbal subscore is 5. GCS motor subscore is 6.  Skin: Skin is warm, dry and intact. No rash noted. No cyanosis.  Psychiatric: Her speech is normal.    ED Course  Procedures (including critical care time)  DIAGNOSTIC STUDIES: Oxygen Saturation is 100% on room air, normal by my interpretation.    COORDINATION OF CARE: 1:58 PM-Discussed treatment plan which includes labs with pt at bedside and pt agreed to plan.     Labs Review Labs Reviewed  BASIC METABOLIC PANEL - Abnormal; Notable for the following:    Potassium 3.6 (*)    Glucose, Bld 122 (*)    GFR calc non Af Amer 62 (*)    GFR calc Af Amer 71 (*)    All other components within normal limits  URINE RAPID DRUG SCREEN (HOSP PERFORMED) - Abnormal; Notable for the following:    Benzodiazepines POSITIVE (*)    All other components within normal limits  URINALYSIS, ROUTINE W REFLEX MICROSCOPIC - Abnormal; Notable for the following:    Glucose, UA >1000 (*)    Hgb urine dipstick TRACE (*)    All other components within normal limits  HEPATIC FUNCTION PANEL - Abnormal; Notable for the following:    Total Protein 8.7 (*)    Total Bilirubin <0.2 (*)    All other components within normal limits  CBG MONITORING, ED - Abnormal; Notable for the following:    Glucose-Capillary 111 (*)    All other components within normal limits   CBC WITH DIFFERENTIAL  ETHANOL  CK  TROPONIN I  URINE MICROSCOPIC-ADD ON    Imaging Review Ct Head Wo Contrast  06/03/2013   CLINICAL DATA Mental status change  EXAM CT HEAD WITHOUT CONTRAST  TECHNIQUE Contiguous axial images were obtained from the base of the skull through the vertex without intravenous contrast.  COMPARISON CT HEAD W/O CM dated 01/30/2013  FINDINGS There is no evidence of mass effect, midline shift, or extra-axial fluid collections. There is no evidence of a space-occupying lesion or intracranial hemorrhage. There is no evidence of a cortical-based area of acute infarction. There is periventricular white matter low attenuation likely secondary to microangiopathy.  The ventricles and sulci are appropriate for the patient's age. The basal cisterns are patent.  Visualized portions of the orbits are unremarkable. There is a left maxillary sinus mucous retention cyst.  The osseous structures are unremarkable.  IMPRESSION No acute intracranial pathology.  SIGNATURE  Electronically Signed   By: Kathreen Devoid   On: 06/03/2013 14:55     EKG Interpretation   Date/Time:  Tuesday June 03 2013 14:17:13 EDT Ventricular Rate:  80 PR Interval:  128 QRS Duration: 82 QT Interval:  394 QTC Calculation: 454 R Axis:   31 Text Interpretation:  Normal sinus rhythm Normal ECG When compared with  ECG of 29-Jan-2013 00:14, No significant change was found Confirmed by  POLLINA  MD, CHRISTOPHER [phone]) on 06/03/2013 3:35:52 PM      MDM   Final diagnoses:  Weakness  Depression  Anxiety    Patient presents to ER with complaints of generalized weakness. She does endorse increased anxiety and depression. She was recently started on Xanax by her primary doctor but has not helped with her anxiety. Her workup was entirely unremarkable today. I suspect that a lot of her symptoms are secondary to psychomotor retardation secondary to her depression. She reports that she has been on Wellbutrin in the  past with good results. This will be restarted in patient is encouraged to followup with her primary care doctor and therapist as soon as possible.   I personally performed the services described in this documentation, which was scribed in my presence. The recorded information has been reviewed and is accurate.    Orpah Greek, MD 06/04/13 1535

## 2013-06-03 NOTE — ED Notes (Signed)
Pt presents to er for further evaluation of depression, generalized weakness, not eating or sleeping well. States that these symptoms started a month ago. Denies any SI or HI, states that she has talked to her doctor, was placed on xanax but it will only last for two hours when she takes it. Has not notified her doctor to let them know that she is not any better, denies any n/v/d.

## 2013-08-23 ENCOUNTER — Encounter (HOSPITAL_COMMUNITY): Payer: Self-pay | Admitting: Emergency Medicine

## 2013-08-23 ENCOUNTER — Observation Stay (HOSPITAL_COMMUNITY)
Admission: EM | Admit: 2013-08-23 | Discharge: 2013-08-25 | Disposition: A | Discharge status: Home / Self Care | Payer: Medicare Other | Attending: Internal Medicine | Admitting: Internal Medicine

## 2013-08-23 ENCOUNTER — Emergency Department (HOSPITAL_COMMUNITY): Payer: Medicare Other

## 2013-08-23 DIAGNOSIS — E872 Acidosis, unspecified: Secondary | ICD-10-CM

## 2013-08-23 DIAGNOSIS — I959 Hypotension, unspecified: Principal | ICD-10-CM | POA: Insufficient documentation

## 2013-08-23 DIAGNOSIS — G4730 Sleep apnea, unspecified: Secondary | ICD-10-CM | POA: Insufficient documentation

## 2013-08-23 DIAGNOSIS — F329 Major depressive disorder, single episode, unspecified: Secondary | ICD-10-CM | POA: Insufficient documentation

## 2013-08-23 DIAGNOSIS — Z01810 Encounter for preprocedural cardiovascular examination: Secondary | ICD-10-CM

## 2013-08-23 DIAGNOSIS — E669 Obesity, unspecified: Secondary | ICD-10-CM

## 2013-08-23 DIAGNOSIS — N289 Disorder of kidney and ureter, unspecified: Secondary | ICD-10-CM

## 2013-08-23 DIAGNOSIS — G9340 Encephalopathy, unspecified: Secondary | ICD-10-CM

## 2013-08-23 DIAGNOSIS — M549 Dorsalgia, unspecified: Secondary | ICD-10-CM | POA: Insufficient documentation

## 2013-08-23 DIAGNOSIS — Z87891 Personal history of nicotine dependence: Secondary | ICD-10-CM | POA: Insufficient documentation

## 2013-08-23 DIAGNOSIS — Z7982 Long term (current) use of aspirin: Secondary | ICD-10-CM | POA: Insufficient documentation

## 2013-08-23 DIAGNOSIS — G8929 Other chronic pain: Secondary | ICD-10-CM | POA: Insufficient documentation

## 2013-08-23 DIAGNOSIS — R9430 Abnormal result of cardiovascular function study, unspecified: Secondary | ICD-10-CM

## 2013-08-23 DIAGNOSIS — E1165 Type 2 diabetes mellitus with hyperglycemia: Secondary | ICD-10-CM | POA: Diagnosis present

## 2013-08-23 DIAGNOSIS — R112 Nausea with vomiting, unspecified: Secondary | ICD-10-CM

## 2013-08-23 DIAGNOSIS — IMO0001 Reserved for inherently not codable concepts without codable children: Secondary | ICD-10-CM

## 2013-08-23 DIAGNOSIS — IMO0002 Reserved for concepts with insufficient information to code with codable children: Secondary | ICD-10-CM | POA: Diagnosis present

## 2013-08-23 DIAGNOSIS — G4733 Obstructive sleep apnea (adult) (pediatric): Secondary | ICD-10-CM

## 2013-08-23 DIAGNOSIS — I5030 Unspecified diastolic (congestive) heart failure: Secondary | ICD-10-CM

## 2013-08-23 DIAGNOSIS — G4700 Insomnia, unspecified: Secondary | ICD-10-CM | POA: Insufficient documentation

## 2013-08-23 DIAGNOSIS — F411 Generalized anxiety disorder: Secondary | ICD-10-CM | POA: Insufficient documentation

## 2013-08-23 DIAGNOSIS — F3289 Other specified depressive episodes: Secondary | ICD-10-CM | POA: Insufficient documentation

## 2013-08-23 DIAGNOSIS — Z9989 Dependence on other enabling machines and devices: Secondary | ICD-10-CM

## 2013-08-23 DIAGNOSIS — Z66 Do not resuscitate: Secondary | ICD-10-CM | POA: Insufficient documentation

## 2013-08-23 DIAGNOSIS — I10 Essential (primary) hypertension: Secondary | ICD-10-CM | POA: Insufficient documentation

## 2013-08-23 DIAGNOSIS — R609 Edema, unspecified: Secondary | ICD-10-CM

## 2013-08-23 DIAGNOSIS — Z96659 Presence of unspecified artificial knee joint: Secondary | ICD-10-CM | POA: Insufficient documentation

## 2013-08-23 DIAGNOSIS — F32A Depression, unspecified: Secondary | ICD-10-CM

## 2013-08-23 DIAGNOSIS — Z85528 Personal history of other malignant neoplasm of kidney: Secondary | ICD-10-CM | POA: Insufficient documentation

## 2013-08-23 DIAGNOSIS — Z720 Tobacco use: Secondary | ICD-10-CM

## 2013-08-23 DIAGNOSIS — D496 Neoplasm of unspecified behavior of brain: Secondary | ICD-10-CM

## 2013-08-23 DIAGNOSIS — M25569 Pain in unspecified knee: Secondary | ICD-10-CM | POA: Insufficient documentation

## 2013-08-23 LAB — CBC WITH DIFFERENTIAL/PLATELET
BASOS PCT: 0 % (ref 0–1)
Basophils Absolute: 0 10*3/uL (ref 0.0–0.1)
Eosinophils Absolute: 0.4 10*3/uL (ref 0.0–0.7)
Eosinophils Relative: 5 % (ref 0–5)
HCT: 35.8 % — ABNORMAL LOW (ref 36.0–46.0)
Hemoglobin: 11.5 g/dL — ABNORMAL LOW (ref 12.0–15.0)
LYMPHS PCT: 14 % (ref 12–46)
Lymphs Abs: 1.3 10*3/uL (ref 0.7–4.0)
MCH: 26.9 pg (ref 26.0–34.0)
MCHC: 32.1 g/dL (ref 30.0–36.0)
MCV: 83.6 fL (ref 78.0–100.0)
MONOS PCT: 6 % (ref 3–12)
Monocytes Absolute: 0.6 10*3/uL (ref 0.1–1.0)
NEUTROS ABS: 6.6 10*3/uL (ref 1.7–7.7)
NEUTROS PCT: 75 % (ref 43–77)
Platelets: 293 10*3/uL (ref 150–400)
RBC: 4.28 MIL/uL (ref 3.87–5.11)
RDW: 15.8 % — ABNORMAL HIGH (ref 11.5–15.5)
WBC: 8.8 10*3/uL (ref 4.0–10.5)

## 2013-08-23 LAB — RAPID URINE DRUG SCREEN, HOSP PERFORMED
Amphetamines: NOT DETECTED
Barbiturates: NOT DETECTED
Benzodiazepines: NOT DETECTED
Cocaine: NOT DETECTED
OPIATES: NOT DETECTED
Tetrahydrocannabinol: NOT DETECTED

## 2013-08-23 LAB — ETHANOL

## 2013-08-23 LAB — URINALYSIS, ROUTINE W REFLEX MICROSCOPIC
Bilirubin Urine: NEGATIVE
Glucose, UA: >1000 mg/dL — AB
HGB URINE DIPSTICK: NEGATIVE
KETONES UR: NEGATIVE mg/dL
LEUKOCYTES UA: NEGATIVE
Nitrite: NEGATIVE
PROTEIN: NEGATIVE mg/dL
Specific Gravity, Urine: 1.015 (ref 1.005–1.030)
Urobilinogen, UA: 0.2 mg/dL (ref 0.0–1.0)
pH: 5.5 (ref 5.0–8.0)

## 2013-08-23 LAB — COMPREHENSIVE METABOLIC PANEL
ALBUMIN: 3.4 g/dL — AB (ref 3.5–5.2)
ALK PHOS: 97 U/L (ref 39–117)
ALT: 7 U/L (ref 0–35)
AST: 14 U/L (ref 0–37)
BILIRUBIN TOTAL: 0.2 mg/dL — AB (ref 0.3–1.2)
BUN: 16 mg/dL (ref 6–23)
CHLORIDE: 103 meq/L (ref 96–112)
CO2: 22 meq/L (ref 19–32)
Calcium: 9 mg/dL (ref 8.4–10.5)
Creatinine, Ser: 1.16 mg/dL — ABNORMAL HIGH (ref 0.50–1.10)
GFR calc Af Amer: 56 mL/min — ABNORMAL LOW (ref >90)
GFR calc non Af Amer: 48 mL/min — ABNORMAL LOW (ref >90)
Glucose, Bld: 220 mg/dL — ABNORMAL HIGH (ref 70–99)
Potassium: 3.8 mEq/L (ref 3.7–5.3)
Sodium: 141 mEq/L (ref 137–147)
Total Protein: 7.6 g/dL (ref 6.0–8.3)

## 2013-08-23 LAB — URINE MICROSCOPIC-ADD ON

## 2013-08-23 LAB — CBG MONITORING, ED: Glucose-Capillary: 231 mg/dL — ABNORMAL HIGH (ref 70–99)

## 2013-08-23 LAB — LACTIC ACID, PLASMA: Lactic Acid, Venous: 2.2 mmol/L (ref 0.5–2.2)

## 2013-08-23 LAB — TROPONIN I: Troponin I: <0.3 ng/mL (ref <0.30)

## 2013-08-23 MED ORDER — OXYCODONE-ACETAMINOPHEN 5-325 MG PO TABS
1.0000 | ORAL_TABLET | Freq: Once | ORAL | Status: AC
  Administered 2013-08-23: 1 via ORAL
  Filled 2013-08-23: qty 1

## 2013-08-23 MED ORDER — SODIUM CHLORIDE 0.9 % IV BOLUS (SEPSIS)
1000.0000 mL | Freq: Once | INTRAVENOUS | Status: AC
  Administered 2013-08-23: 1000 mL via INTRAVENOUS

## 2013-08-23 MED ORDER — SODIUM CHLORIDE 0.9 % IV SOLN
INTRAVENOUS | Status: DC
  Administered 2013-08-23: 1000 mL via INTRAVENOUS
  Administered 2013-08-24: 13:00:00 via INTRAVENOUS

## 2013-08-23 NOTE — ED Notes (Signed)
Patient reports hypotension today at home. Also reports generalized weakness since yesterday and states "I'm stumbling around."

## 2013-08-23 NOTE — ED Provider Notes (Signed)
CSN: [id number]     Arrival date & time 08/23/13  2021 History  This chart was scribed for Sharyon Cable, MD by Elby Beck, ED Scribe. This patient was seen in room APA02/APA02 and the patient's care was started at 8:46 PM.  Chief Complaint  Patient presents with  . Hypotension  . Weakness    Patient is a 68 y.o. female presenting with weakness. The history is provided by the patient. No language interpreter was used.  Weakness This is a new problem. The current episode started yesterday. The problem occurs rarely. The problem has not changed since onset.Associated symptoms include shortness of breath. Pertinent negatives include no chest pain, no abdominal pain and no headaches. Nothing aggravates the symptoms. Nothing relieves the symptoms. She has tried nothing for the symptoms.    HPI Comments: Rebecca Richards is a 68 y.o. female who presents to the Emergency Department complaining of generalized weakness onset yesterday that seems to be associated with hypotension. Her blood pressure in the ED was taken to be 88/50. She states that she has had associated SOB tonight. She also states that she is experiencing lower back pain that radiates down her left leg all the way to her toes. She states that she has a history of similar back pain in the past. She denies any injury occurring to have caused this back pain. She states that she has recently began taking Invokana. She denies fever, emesis, diarrhea, chest pain, headache, dysuria, syncope or any other symptoms.    Past Medical History  Diagnosis Date  . Brain tumor     Surgery for benign brain tumor in the remote past  . Hypertension   . Preop cardiovascular exam     Cardiac clearance for knee surgery, January, 2013  . Edema   . Tobacco abuse   . Ejection fraction     EF 70%, echo, February, 2013  . Sleep apnea     uses Cpap  . Hepatitis 1970's    hep c  . Anxiety   . 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 Nasal congestion   .  Diabetes mellitus without complication   . Insomnia   . 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 Spinal cord stimulator status     for pain   . Chronic knee pain   . Chronic back pain     with radiculopathy bilat legs  . Depression   . 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 Arthritis    Past Surgical History  Procedure Laterality Date  . Abdominal hysterectomy    . Knee surgery    . Nose surgery    . Back surgery      pt has stimulator in lower back and can't have an MRI  . Total knee arthroplasty  07/21/2011    Procedure: TOTAL KNEE ARTHROPLASTY;  Surgeon: Yvette Rack., MD;  Location: [facility];  Service: Orthopedics;  Laterality: Left;  . 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 Cholecystectomy N/A 09/30/2012    Procedure: LAPAROSCOPIC CHOLECYSTECTOMY;  Surgeon: Donato Heinz, MD;  Location: [facility];  Service: General;  Laterality: N/A;   Family History  Problem Relation Age of Onset  . Cancer Mother   . Hypertension Mother   . Anesthesia problems Neg Hx   . Diabetes Mother   . Diabetes Brother    History  Substance Use Topics  . Smoking status: Former Smoker -- 0.50 packs/day for 10 years    Types: Cigarettes    Quit date: 02/19/2012  . Smokeless tobacco: Never Used  . Alcohol Use: No     Comment: none since 03-28-2011   OB History   Grav Para Term Preterm Abortions TAB SAB Ect Mult Living   1 1 1       1      Review of Systems  Constitutional: Negative for fever.  Respiratory: Positive for shortness of breath.   Cardiovascular: Negative for chest pain.  Gastrointestinal: Negative for vomiting, abdominal pain and diarrhea.  Genitourinary: Negative for dysuria.  Musculoskeletal: Positive for back pain.  Neurological: Positive for weakness. Negative for syncope and headaches.  All other systems reviewed and are negative.   Allergies  Review of patient's allergies indicates no known allergies.  Home Medications    Prior to Admission medications   Medication Sig Start Date End Date Taking? Authorizing Provider  ALPRAZolam Duanne Moron) 1 MG tablet Take 0.5-1 mg by mouth 2 (two) times daily. Patient takes 1 tablet during the day then 1/2 tablet at night    Historical Provider, MD  amLODipine (NORVASC) 5 MG tablet Take 5 mg by mouth daily.    Historical Provider, MD  Apoaequorin (PREVAGEN PO) Take 1 tablet by mouth daily.    Historical Provider, MD  aspirin EC 81 MG tablet Take 1 tablet (81 mg total) by mouth daily. 01/31/13   Kathie Dike, MD  Aspirin-Salicylamide-Caffeine (BC HEADACHE) 325-95-16 MG TABS Take 1 packet by mouth daily as needed (for pain).    Historical Provider, MD  buPROPion (WELLBUTRIN XL) 150 MG 24 hr tablet Take 1 tablet (150 mg total) by mouth daily. 06/03/13   Orpah Greek, MD  Canagliflozin (INVOKANA) 100 MG TABS Take 100 mg by mouth daily.    Historical Provider, MD  chlorproMAZINE (THORAZINE) 50 MG tablet Take 3 tablets (150 mg total) by mouth at bedtime. 01/31/13   Kathie Dike, MD  Coenzyme Q10 (CO Q 10 PO) Take 1 tablet by mouth 2 (two) times daily.    Historical Provider, MD  diazepam (VALIUM) 5 MG tablet Take 1 tablet (5 mg total) by mouth every 6 (six) hours as needed for anxiety (spasms). 06/03/13   Orpah Greek, MD  lisinopril (PRINIVIL,ZESTRIL) 20 MG tablet Take 20 mg by mouth daily.    Historical Provider, MD  metFORMIN (GLUCOPHAGE) 500 MG tablet Take 500 mg by mouth 2 (two) times daily.    Historical Provider, MD  naproxen sodium (ALEVE) 220 MG tablet Take 220 mg by mouth daily as needed (for pain).    Historical Provider, MD  oxyCODONE-acetaminophen (PERCOCET) 7.5-325 MG per tablet Take 1 tablet by mouth every 4 (four) hours as needed for pain.    Historical Provider, MD   Triage Vitals: BP 88/50  Pulse 107  Temp(Src) 97.7 F (36.5 C) (Oral)  Resp 16  Ht 5\' 1"  (1.549 m)  Wt 176 lb (79.833 kg)  BMI 33.27 kg/m2  SpO2 97%  Physical  Exam CONSTITUTIONAL: Well developed/well nourished HEAD: Normocephalic/atraumatic EYES: EOMI/PERRL ENMT: Mucous membranes moist NECK: supple no meningeal signs SPINE:entire spine nontender CV: S1/S2 noted, no murmurs/rubs/gallops noted LUNGS: Lungs are clear to auscultation bilaterally, no apparent distress ABDOMEN: soft, nontender, no rebound or guarding GU:no cva tenderness NEURO: Pt is awake/alert, moves all extremitiesx4, no arm or leg drift noted EXTREMITIES: pulses normal, full ROM SKIN: warm, color normal PSYCH: no abnormalities of  mood noted  ED Course  Procedures   DIAGNOSTIC STUDIES: Oxygen Saturation is 97% on RA, normal by my interpretation.    COORDINATION OF CARE: 8:51 PM- Discussed plan to obtain diagnostic lab work and radiology. Pt advised of plan for treatment and pt agrees. 11:07 PM Pt improved, she has no new complaints However she had SBP in the 80s at home and also here in the ED Given age and concern for return of hypotension (suspect it may be from home meds, not septic appearing) Will admit for observation D/w dr Darrick Meigs, will admit to tele OBS Pt agreeable with plan BP 110/71  Pulse 86  Temp(Src) 97.7 F (36.5 C) (Rectal)  Resp 16  Ht 5\' 1"  (1.549 m)  Wt 176 lb (79.833 kg)  BMI 33.27 kg/m2  SpO2 96%  Labs Review Labs Reviewed  CBC WITH DIFFERENTIAL - Abnormal; Notable for the following:    Hemoglobin 11.5 (*)    HCT 35.8 (*)    RDW 15.8 (*)    All other components within normal limits  COMPREHENSIVE METABOLIC PANEL - Abnormal; Notable for the following:    Glucose, Bld 220 (*)    Creatinine, Ser 1.16 (*)    Albumin 3.4 (*)    Total Bilirubin 0.2 (*)    GFR calc non Af Amer 48 (*)    GFR calc Af Amer 56 (*)    All other components within normal limits  URINALYSIS, ROUTINE W REFLEX MICROSCOPIC - Abnormal; Notable for the following:    Color, Urine STRAW (*)    Glucose, UA >1000 (*)    All other components within normal limits  CBG  MONITORING, ED - Abnormal; Notable for the following:    Glucose-Capillary 231 (*)    All other components within normal limits  URINE CULTURE  ETHANOL  TROPONIN I  LACTIC ACID, PLASMA  URINE MICROSCOPIC-ADD ON  URINE RAPID DRUG SCREEN (HOSP PERFORMED)  CBG MONITORING, ED    Imaging Review Dg Chest Portable 1 View  08/23/2013   CLINICAL DATA:  67 year old female with weakness shortness of Breath hypotension. Initial encounter.  EXAM: PORTABLE CHEST - 1 VIEW  COMPARISON:  01/29/2013 and earlier.  FINDINGS: Portable AP semi upright view at 2102 hrs. Stable lung volumes. Stable cardiac size and mediastinal contours. Faintly visible thoracic spinal stimulator. No pneumothorax, pleural effusion, consolidation, or overt pulmonary edema identified. There is a new faint double density at the left cardiophrenic angle. Burtis Junes this is artifact due to patient positioning. Visualized tracheal air column is within normal limits.  IMPRESSION: No acute cardiopulmonary abnormality.   Electronically Signed   By: Lars Pinks M.D.   On: 08/23/2013 21:18     Date: 08/23/2013  Rate: 93  Rhythm: normal sinus rhythm  QRS Axis: normal  Intervals: QT prolonged  ST/T Wave abnormalities: normal  Conduction Disutrbances:none      MDM   Final diagnoses:  Hypotension    Nursing notes including past medical history and social history reviewed and considered in documentation xrays reviewed and considered Labs/vital reviewed and considered    I personally performed the services described in this documentation, which was scribed in my presence. The recorded information has been reviewed and is accurate.       Sharyon Cable, MD 08/23/13 [phone]

## 2013-08-23 NOTE — H&P (Signed)
PCP:   Monico Blitz, MD   Chief Complaint:  Weakness  HPI:  68 female who  has a past medical history of Brain tumor; Hypertension; Preop cardiovascular exam; Edema; Tobacco abuse; Ejection fraction; Sleep apnea; Hepatitis (1970's); Anxiety; Lichen planus; Nasal congestion; Diabetes mellitus without complication; Insomnia; Renal disorder; Spinal cord stimulator status; Chronic knee pain; Chronic back pain; Depression; Cancer (2006); and Arthritis. Presents to the ED with complaints of generalized weakness at home, when she checked her blood pressure at home it wasn't 80's so she came to the hospital. She denies chest pain no shortness of breath no nausea vomiting or diarrhea. In the ED patient was given IV fluids and her pressure improved. Orthostatics were checked which were normal. EKG was done, discussed with ED physician Dr. Christy Gentles, the EKG shows QTC prolongation, 493. Patient does take Thorazine 150 mg at bedtime for sleep.  Patient has a history of chronic back pain, at this time patient is denying any weakness, she denies seizure, no blurred vision no passing out episode. Patient at this time  can walk without difficulty. Patient says that she has been eating and drinking fine over the past few days but today she missed her dinner. She also denies changes in medications, no checking the history patient was taking lisinopril 10 mg daily in November 2014 and now she's taking 20 mg daily.  Allergies:  No Known Allergies    Past Medical History  Diagnosis Date  . Brain tumor     Surgery for benign brain tumor in the remote past  . Hypertension   . Preop cardiovascular exam     Cardiac clearance for knee surgery, January, 2013  . Edema   . Tobacco abuse   . Ejection fraction     EF 70%, echo, February, 2013  . Sleep apnea     uses Cpap  . Hepatitis 1970's    hep c  . Anxiety   . 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 Nasal congestion   . Diabetes mellitus without complication   .  Insomnia   . 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 Spinal cord stimulator status     for pain   . Chronic knee pain   . Chronic back pain     with radiculopathy bilat legs  . Depression   . 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 Arthritis     Past Surgical History  Procedure Laterality Date  . Abdominal hysterectomy    . Knee surgery    . Nose surgery    . Back surgery      pt has stimulator in lower back and can't have an MRI  . Total knee arthroplasty  07/21/2011    Procedure: TOTAL KNEE ARTHROPLASTY;  Surgeon: Yvette Rack., MD;  Location: [facility];  Service: Orthopedics;  Laterality: Left;  . 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 Cholecystectomy N/A 09/30/2012    Procedure: LAPAROSCOPIC CHOLECYSTECTOMY;  Surgeon: Donato Heinz, MD;  Location: [facility];  Service: General;  Laterality: N/A;    Prior to Admission medications   Medication Sig Start Date End Date Taking? Authorizing Provider  ALPRAZolam Duanne Moron) 0.5 MG tablet Take 0.5 mg by mouth 3 (three) times daily.   Yes Historical Provider, MD  amLODipine (NORVASC) 5 MG tablet Take 5 mg by mouth every morning.    Yes Historical  Provider, MD  Apoaequorin (PREVAGEN PO) Take 1 tablet by mouth daily.   Yes Historical Provider, MD  aspirin EC 81 MG tablet Take 1 tablet (81 mg total) by mouth daily. 01/31/13  Yes Kathie Dike, MD  Aspirin-Salicylamide-Caffeine (BC HEADACHE) 325-95-16 MG TABS Take 1 packet by mouth daily as needed (for pain).   Yes Historical Provider, MD  buPROPion (WELLBUTRIN SR) 150 MG 12 hr tablet Take 150 mg by mouth 2 (two) times daily.   Yes Historical Provider, MD  Canagliflozin (INVOKANA) 100 MG TABS Take 100 mg by mouth daily.   Yes Historical Provider, MD  chlorproMAZINE (THORAZINE) 50 MG tablet Take 3 tablets (150 mg total) by mouth at bedtime. 01/31/13  Yes Kathie Dike, MD  lisinopril (PRINIVIL,ZESTRIL) 20 MG tablet Take 20 mg by mouth daily.   Yes Historical  Provider, MD  metFORMIN (GLUCOPHAGE) 500 MG tablet Take 500 mg by mouth 2 (two) times daily.   Yes Historical Provider, MD  oxyCODONE-acetaminophen (PERCOCET) 7.5-325 MG per tablet Take 1 tablet by mouth every 4 (four) hours as needed for pain.   Yes Historical Provider, MD    Social History:  reports that she quit smoking about 18 months ago. Her smoking use included Cigarettes. She has a 5 pack-year smoking history. She has never used smokeless tobacco. She reports that she does not drink alcohol or use illicit drugs.  Family History  Problem Relation Age of Onset  . Cancer Mother   . Hypertension Mother   . Anesthesia problems Neg Hx   . Diabetes Mother   . Diabetes Brother      All the positives are listed in BOLD  Review of Systems:  HEENT: Headache, blurred vision, runny nose, sore throat Neck: Hypothyroidism, hyperthyroidism,,lymphadenopathy Chest : Shortness of breath, history of COPD, Asthma Heart : Chest pain, history of coronary arterey disease GI:  Nausea, vomiting, diarrhea, constipation, GERD GU: Dysuria, urgency, frequency of urination, hematuria Neuro: Stroke, seizures, syncope Psych: Depression, anxiety, hallucinations   Physical Exam: Blood pressure 110/71, pulse 86, temperature 97.7 F (36.5 C), temperature source Rectal, resp. rate 16, height 5\' 1"  (1.549 m), weight 79.833 kg (176 lb), SpO2 96.00%. Constitutional:   Patient is a well-developed and well-nourished female* in no acute distress and cooperative with exam. Head: Normocephalic and atraumatic Mouth: Mucus membranes moist Eyes: PERRL, EOMI, conjunctivae normal Neck: Supple, No Thyromegaly Cardiovascular: RRR, S1 normal, S2 normal Pulmonary/Chest: CTAB, no wheezes, rales, or rhonchi Abdominal: Soft. Non-tender, non-distended, bowel sounds are normal, no masses, organomegaly, or guarding present.  Neurological: A&O x3, Strenght is normal and symmetric bilaterally, cranial nerve II-XII are grossly  intact, no focal motor deficit, sensory intact to light touch bilaterally.  Extremities : No Cyanosis, Clubbing or Edema  Labs on Admission:  Basic Metabolic Panel:  Recent Labs Lab 08/23/13 2128  NA 141  K 3.8  CL 103  CO2 22  GLUCOSE 220*  BUN 16  CREATININE 1.16*  CALCIUM 9.0   Liver Function Tests:  Recent Labs Lab 08/23/13 2128  AST 14  ALT 7  ALKPHOS 97  BILITOT 0.2*  PROT 7.6  ALBUMIN 3.4*   No results found for this basename: LIPASE, AMYLASE,  in the last 168 hours No results found for this basename: AMMONIA,  in the last 168 hours CBC:  Recent Labs Lab 08/23/13 2128  WBC 8.8  NEUTROABS 6.6  HGB 11.5*  HCT 35.8*  MCV 83.6  PLT 293   Cardiac Enzymes:  Recent Labs Lab 08/23/13 2128  TROPONINI <0.30    BNP (last 3 results) No results found for this basename: PROBNP,  in the last 8760 hours CBG:  Recent Labs Lab 08/23/13 2045  GLUCAP 231*    Radiological Exams on Admission: Dg Chest Portable 1 View  08/23/2013   CLINICAL DATA:  68 year old female with weakness shortness of Breath hypotension. Initial encounter.  EXAM: PORTABLE CHEST - 1 VIEW  COMPARISON:  01/29/2013 and earlier.  FINDINGS: Portable AP semi upright view at 2102 hrs. Stable lung volumes. Stable cardiac size and mediastinal contours. Faintly visible thoracic spinal stimulator. No pneumothorax, pleural effusion, consolidation, or overt pulmonary edema identified. There is a new faint double density at the left cardiophrenic angle. Burtis Junes this is artifact due to patient positioning. Visualized tracheal air column is within normal limits.  IMPRESSION: No acute cardiopulmonary abnormality.   Electronically Signed   By: Lars Pinks M.D.   On: 08/23/2013 21:18      Assessment/Plan Principal Problem:   Hypotension Active Problems:   Diabetes mellitus type 2, uncontrolled  Hypotension  ? Cause , patient does have a history of hypertension and takes amlodipine and lisinopril at home.  At this time hypertension has resolved after the IV fluids. Am going to hold Thorazine which she takes for sleep. As patient does have mild QTC prolongation, and also it can cause orthostatic hypotension. Though orthostatics checked in the ED were normal , will obtain orthostatics every shift and cycle cardiac enzymes. First set of troponin is negative in the ED.   Weakness  Resolved at this time , likely due to above .continue to monitor   Diabetes mellitus  Will initiate sliding scale insulin, hold oral hypoglycemics.   DVT prophylaxis  Lovenox   Code status:Patient is DO NOT RESUSCITATE  Family discussion- the family at bedside, discussed with patient in detail. She wants to be DO NOT RESUSCITATE.  Time Spent on Admission: 70 minutes  Oswald Hillock [facility] Pager: [phone] 08/23/2013, 11:36 PM  If 7PM-7AM, please contact night-coverage  [email or web address]  Password [email or web address]

## 2013-08-24 DIAGNOSIS — E669 Obesity, unspecified: Secondary | ICD-10-CM

## 2013-08-24 DIAGNOSIS — I10 Essential (primary) hypertension: Secondary | ICD-10-CM

## 2013-08-24 LAB — COMPREHENSIVE METABOLIC PANEL
ALT: 8 U/L (ref 0–35)
AST: 12 U/L (ref 0–37)
Albumin: 3.2 g/dL — ABNORMAL LOW (ref 3.5–5.2)
Alkaline Phosphatase: 89 U/L (ref 39–117)
BUN: 14 mg/dL (ref 6–23)
CALCIUM: 8.5 mg/dL (ref 8.4–10.5)
CO2: 22 meq/L (ref 19–32)
CREATININE: 1.06 mg/dL (ref 0.50–1.10)
Chloride: 106 mEq/L (ref 96–112)
GFR calc Af Amer: 62 mL/min — ABNORMAL LOW (ref >90)
GFR, EST NON AFRICAN AMERICAN: 54 mL/min — AB (ref >90)
GLUCOSE: 157 mg/dL — AB (ref 70–99)
Potassium: 3.9 mEq/L (ref 3.7–5.3)
Sodium: 143 mEq/L (ref 137–147)
TOTAL PROTEIN: 6.9 g/dL (ref 6.0–8.3)
Total Bilirubin: <0.2 mg/dL — ABNORMAL LOW (ref 0.3–1.2)

## 2013-08-24 LAB — CBC
HCT: 35 % — ABNORMAL LOW (ref 36.0–46.0)
HEMOGLOBIN: 10.9 g/dL — AB (ref 12.0–15.0)
MCH: 26.7 pg (ref 26.0–34.0)
MCHC: 31.1 g/dL (ref 30.0–36.0)
MCV: 85.6 fL (ref 78.0–100.0)
PLATELETS: 298 10*3/uL (ref 150–400)
RBC: 4.09 MIL/uL (ref 3.87–5.11)
RDW: 15.8 % — ABNORMAL HIGH (ref 11.5–15.5)
WBC: 8.7 10*3/uL (ref 4.0–10.5)

## 2013-08-24 LAB — TROPONIN I: Troponin I: <0.3 ng/mL (ref <0.30)

## 2013-08-24 LAB — GLUCOSE, CAPILLARY
GLUCOSE-CAPILLARY: 157 mg/dL — AB (ref 70–99)
GLUCOSE-CAPILLARY: 146 mg/dL — AB (ref 70–99)
Glucose-Capillary: 127 mg/dL — ABNORMAL HIGH (ref 70–99)
Glucose-Capillary: 116 mg/dL — ABNORMAL HIGH (ref 70–99)

## 2013-08-24 LAB — TSH: TSH: 0.602 u[IU]/mL (ref 0.350–4.500)

## 2013-08-24 MED ORDER — ONDANSETRON HCL 4 MG/2ML IJ SOLN: 4.0000 mg | Freq: Four times a day (QID) | INTRAMUSCULAR | Status: DC | PRN

## 2013-08-24 MED ORDER — LISINOPRIL 10 MG PO TABS
20.0000 mg | ORAL_TABLET | Freq: Every day | ORAL | Status: DC
Start: 2013-08-24 — End: 2013-08-25
  Administered 2013-08-24 – 2013-08-25 (×2): 20 mg via ORAL
  Filled 2013-08-24 (×2): qty 2

## 2013-08-24 MED ORDER — ALPRAZOLAM 0.5 MG PO TABS: 0.5000 mg | ORAL_TABLET | Freq: Three times a day (TID) | ORAL | Status: DC

## 2013-08-24 MED ORDER — AMLODIPINE BESYLATE 5 MG PO TABS: 5.0000 mg | ORAL_TABLET | Freq: Every morning | ORAL | Status: DC

## 2013-08-24 MED ORDER — ALPRAZOLAM 0.5 MG PO TABS
0.5000 mg | ORAL_TABLET | Freq: Three times a day (TID) | ORAL | Status: DC
  Administered 2013-08-24 – 2013-08-25 (×6): 0.5 mg via ORAL
  Filled 2013-08-24 (×6): qty 1

## 2013-08-24 MED ORDER — BUPROPION HCL ER (SR) 150 MG PO TB12
150.0000 mg | ORAL_TABLET | Freq: Two times a day (BID) | ORAL | Status: DC
  Administered 2013-08-24 – 2013-08-25 (×3): 150 mg via ORAL
  Filled 2013-08-24 (×7): qty 1

## 2013-08-24 MED ORDER — ACETAMINOPHEN 325 MG PO TABS: 650.0000 mg | ORAL_TABLET | Freq: Four times a day (QID) | ORAL | Status: DC | PRN

## 2013-08-24 MED ORDER — ENOXAPARIN SODIUM 40 MG/0.4ML ~~LOC~~ SOLN
40.0000 mg | SUBCUTANEOUS | Status: DC
  Administered 2013-08-24 – 2013-08-25 (×2): 40 mg via SUBCUTANEOUS
  Filled 2013-08-24 (×4): qty 0.4

## 2013-08-24 MED ORDER — INSULIN ASPART 100 UNIT/ML ~~LOC~~ SOLN
0.0000 [IU] | Freq: Three times a day (TID) | SUBCUTANEOUS | Status: DC
  Administered 2013-08-24: 1 [IU] via SUBCUTANEOUS
  Administered 2013-08-24: 2 [IU] via SUBCUTANEOUS
  Administered 2013-08-25: 1 [IU] via SUBCUTANEOUS
  Administered 2013-08-25: 2 [IU] via SUBCUTANEOUS

## 2013-08-24 MED ORDER — OXYCODONE-ACETAMINOPHEN 7.5-325 MG PO TABS: 1.0000 | ORAL_TABLET | ORAL | Status: DC | PRN

## 2013-08-24 MED ORDER — ONDANSETRON HCL 4 MG PO TABS: 4.0000 mg | ORAL_TABLET | Freq: Four times a day (QID) | ORAL | Status: DC | PRN

## 2013-08-24 MED ORDER — ACETAMINOPHEN 650 MG RE SUPP: 650.0000 mg | Freq: Four times a day (QID) | RECTAL | Status: DC | PRN

## 2013-08-24 MED ORDER — METFORMIN HCL 500 MG PO TABS
500.0000 mg | ORAL_TABLET | Freq: Two times a day (BID) | ORAL | Status: DC
  Administered 2013-08-24 – 2013-08-25 (×2): 500 mg via ORAL
  Filled 2013-08-24 (×2): qty 1

## 2013-08-24 MED ORDER — ASPIRIN EC 81 MG PO TBEC
81.0000 mg | DELAYED_RELEASE_TABLET | Freq: Every day | ORAL | Status: DC
  Administered 2013-08-24 – 2013-08-25 (×2): 81 mg via ORAL
  Filled 2013-08-24 (×2): qty 1

## 2013-08-24 MED ORDER — OXYCODONE HCL 5 MG PO TABS: 2.5000 mg | ORAL_TABLET | ORAL | Status: DC | PRN

## 2013-08-24 MED ORDER — OXYCODONE-ACETAMINOPHEN 5-325 MG PO TABS
1.0000 | ORAL_TABLET | ORAL | Status: DC | PRN
  Administered 2013-08-24 – 2013-08-25 (×4): 1 via ORAL
  Filled 2013-08-24 (×4): qty 1

## 2013-08-24 NOTE — Progress Notes (Signed)
[facility] PROGRESS NOTE  Rebecca Richards KYH:[id number] DOB: 1945-09-23 DOA: 08/23/2013 PCP: Monico Blitz, MD  Assessment/Plan: Hypotension  -meds vs orthostasis, infection unlikely and hgb stable  -hypotension has resolved after the IV fluids.  -holdingThorazine which she takes for sleep, given QTC prolongation, and also it can cause orthostatic hypotension. orthostatics checked in the ED were normal -followorthostatics every shift and cycle cardiac enzymes.  -troponins negative x3  -continue IVF, will continue lisinopril - monitor BP and further  -continue holding off norvasc  Weakness  improved at this time. continue to monitor  -PT/OT Diabetes mellitus  continue sliding scale insulin, resume metformin.      Code Status: DNR Family Communication: None at bedside Disposition Plan: to home when medically ready   Consultants:  none  Procedures:  none  Antibiotics:  none  HPI/Subjective: States she feels better this am, denies CP, no SOB  Objective: Filed Vitals:   08/24/13 1002  BP: 111/72  Pulse: 76  Temp: 97.3 F (36.3 C)  Resp:     Intake/Output Summary (Last 24 hours) at 08/24/13 1206 Last data filed at 08/24/13 1010  Gross per 24 hour  Intake    120 ml  Output      0 ml  Net    120 ml   Filed Weights   08/23/13 2031 08/24/13 0015  Weight: 79.833 kg (176 lb) 79.5 kg (175 lb 4.3 oz)    Exam:  General: alert & oriented x 3 In NAD Cardiovascular: RRR, nl S1 s2 Respiratory: CTAB Abdomen: soft +BS NT/ND, no masses palpable Extremities: No cyanosis and no edema    Data Reviewed: Basic Metabolic Panel:  Recent Labs Lab 08/23/13 2128 08/24/13 0537  NA 141 143  K 3.8 3.9  CL 103 106  CO2 22 22  GLUCOSE 220* 157*  BUN 16 14  CREATININE 1.16* 1.06  CALCIUM 9.0 8.5   Liver Function Tests:  Recent Labs Lab 08/23/13 2128 08/24/13 0537  AST 14 12  ALT 7 8  ALKPHOS 97 89  BILITOT 0.2* <0.2*  PROT 7.6 6.9  ALBUMIN 3.4*  3.2*   No results found for this basename: LIPASE, AMYLASE,  in the last 168 hours No results found for this basename: AMMONIA,  in the last 168 hours CBC:  Recent Labs Lab 08/23/13 2128 08/24/13 0537  WBC 8.8 8.7  NEUTROABS 6.6  --   HGB 11.5* 10.9*  HCT 35.8* 35.0*  MCV 83.6 85.6  PLT 293 298   Cardiac Enzymes:  Recent Labs Lab 08/23/13 2128 08/23/13 2347 08/24/13 0537 08/24/13 1126  TROPONINI <0.30 <0.30 <0.30 <0.30   BNP (last 3 results) No results found for this basename: PROBNP,  in the last 8760 hours CBG:  Recent Labs Lab 08/23/13 2045 08/24/13 0740 08/24/13 1108  GLUCAP 231* 127* 157*    No results found for this or any previous visit (from the past 240 hour(s)).   Studies: Dg Chest Portable 1 View  08/23/2013   CLINICAL DATA:  68 year old female with weakness shortness of Breath hypotension. Initial encounter.  EXAM: PORTABLE CHEST - 1 VIEW  COMPARISON:  01/29/2013 and earlier.  FINDINGS: Portable AP semi upright view at 2102 hrs. Stable lung volumes. Stable cardiac size and mediastinal contours. Faintly visible thoracic spinal stimulator. No pneumothorax, pleural effusion, consolidation, or overt pulmonary edema identified. There is a new faint double density at the left cardiophrenic angle. Burtis Junes this is artifact due to patient positioning. Visualized tracheal air column is within normal limits.  IMPRESSION: No acute cardiopulmonary abnormality.   Electronically Signed   By: Lars Pinks M.D.   On: 08/23/2013 21:18    Scheduled Meds: . ALPRAZolam  0.5 mg Oral TID  . aspirin EC  81 mg Oral Daily  . buPROPion  150 mg Oral BID  . enoxaparin (LOVENOX) injection  40 mg Subcutaneous Q24H  . insulin aspart  0-9 Units Subcutaneous TID WC  . lisinopril  20 mg Oral Daily  . metFORMIN  500 mg Oral BID WC   Continuous Infusions: . sodium chloride 1,000 mL (08/23/13 2353)    Principal Problem:   Hypotension Active Problems:   Diabetes mellitus type 2,  uncontrolled    Time spent: [facility] Pager [phone]. If 7PM-7AM, please contact night-coverage at [email or web address], password [facility] 08/24/2013, 12:06 PM  LOS: 1 day

## 2013-08-25 DIAGNOSIS — F17200 Nicotine dependence, unspecified, uncomplicated: Secondary | ICD-10-CM

## 2013-08-25 LAB — GLUCOSE, CAPILLARY
Glucose-Capillary: 162 mg/dL — ABNORMAL HIGH (ref 70–99)
Glucose-Capillary: 143 mg/dL — ABNORMAL HIGH (ref 70–99)

## 2013-08-25 LAB — BASIC METABOLIC PANEL
BUN: 13 mg/dL (ref 6–23)
CO2: 25 mEq/L (ref 19–32)
Calcium: 8.9 mg/dL (ref 8.4–10.5)
Chloride: 108 mEq/L (ref 96–112)
Creatinine, Ser: 1.09 mg/dL (ref 0.50–1.10)
GFR, EST AFRICAN AMERICAN: 60 mL/min — AB (ref >90)
GFR, EST NON AFRICAN AMERICAN: 52 mL/min — AB (ref >90)
Glucose, Bld: 159 mg/dL — ABNORMAL HIGH (ref 70–99)
Potassium: 4.5 mEq/L (ref 3.7–5.3)
SODIUM: 145 meq/L (ref 137–147)

## 2013-08-25 LAB — CBC
HCT: 35.7 % — ABNORMAL LOW (ref 36.0–46.0)
Hemoglobin: 11.2 g/dL — ABNORMAL LOW (ref 12.0–15.0)
MCH: 26.9 pg (ref 26.0–34.0)
MCHC: 31.4 g/dL (ref 30.0–36.0)
MCV: 85.6 fL (ref 78.0–100.0)
PLATELETS: 303 10*3/uL (ref 150–400)
RBC: 4.17 MIL/uL (ref 3.87–5.11)
RDW: 15.8 % — ABNORMAL HIGH (ref 11.5–15.5)
WBC: 7.1 10*3/uL (ref 4.0–10.5)

## 2013-08-25 LAB — URINE CULTURE: Colony Count: 25000

## 2013-08-25 NOTE — Progress Notes (Signed)
Patient discharged with instructions, prescriptions, and carenotes. She verbalized understanding via teach back method.  The patient left the floor with staff via w/c in stable condition.  

## 2013-08-25 NOTE — Evaluation (Signed)
Physical Therapy Evaluation Patient Details Name: Rebecca Richards MRN: [id number] DOB: 14-Dec-1945 Today's Date: 08/25/2013   History of Present Illness  Presents to the ED with complaints of generalized weakness at home, when she checked her blood pressure at home it wasn't 80's so she came to the hospital. Pt with a PMH of brain tumor and chronic back pain.  Clinical Impression  Pt is independent with basic mobility on the unit. Recommend d/c home when medically stable and no follow-up therapy needed. Pt is d/c from acute PT services.    Follow Up Recommendations No PT follow up    Equipment Recommendations  None recommended by PT    Recommendations for Other Services       Precautions / Restrictions Restrictions Weight Bearing Restrictions: No      Mobility  Bed Mobility Overal bed mobility: Independent                Transfers Overall transfer level: Modified independent                  Ambulation/Gait Ambulation/Gait assistance: Modified independent (Device/Increase time) Ambulation Distance (Feet): 200 Feet Assistive device: None Gait Pattern/deviations: WFL(Within Functional Limits)   Gait velocity interpretation: at or above normal speed for age/gender    Stairs            Wheelchair Mobility    Modified Rankin (Stroke Patients Only)       Balance Overall balance assessment: No apparent balance deficits (not formally assessed)                                           Pertinent Vitals/Pain     Home Living Family/patient expects to be discharged to:: Private residence Living Arrangements: Children Available Help at Discharge: Family Type of Home: House Home Access: Stairs to enter     Home Layout: One level Home Equipment: Environmental consultant - 2 wheels;Cane - single point      Prior Function Level of Independence: Independent               Hand Dominance        Extremity/Trunk Assessment   Upper  Extremity Assessment: Defer to OT evaluation           Lower Extremity Assessment: Overall WFL for tasks assessed         Communication   Communication: No difficulties  Cognition Arousal/Alertness: Awake/alert Behavior During Therapy: WFL for tasks assessed/performed Overall Cognitive Status: Within Functional Limits for tasks assessed                      General Comments      Exercises        Assessment/Plan    PT Assessment Patent does not need any further PT services  PT Diagnosis     PT Problem List    PT Treatment Interventions     PT Goals (Current goals can be found in the Care Plan section)      Frequency     Barriers to discharge        Co-evaluation               End of Session   Activity Tolerance: Patient tolerated treatment well Patient left: in bed;with call bell/phone within reach Nurse Communication: Other (comment) (pt is independent)    Functional Assessment Tool Used: clinical  observation Functional Limitation: Mobility: Walking and moving around Mobility: Walking and Moving Around Current Status [phone]): 0 percent impaired, limited or restricted Mobility: Walking and Moving Around Goal Status [phone]): 0 percent impaired, limited or restricted Mobility: Walking and Moving Around Discharge Status [phone]): 0 percent impaired, limited or restricted    Time: 0815-0828 PT Time Calculation (min): 13 min   Charges:   PT Evaluation $Initial PT Evaluation Tier I: 1 Procedure     PT G Codes:   Functional Assessment Tool Used: clinical observation Functional Limitation: Mobility: Walking and moving around    [employer] 08/25/2013, 9:24 AM

## 2013-08-25 NOTE — Discharge Summary (Signed)
Physician Discharge Summary  Rebecca Richards TDV:[id number] DOB: [birth year] DOA: 08/23/2013  PCP: Monico Blitz, MD  Admit date: 08/23/2013 Discharge date: 08/25/2013  Time spent: <30 minutes  Recommendations for Outpatient Follow-up:  Follow-up Information   Follow up with [facility], MD. (next week, call for appt upon discharge)    Specialty:  Internal Medicine   Contact information:   [place] [address] [phone]        Discharge Diagnoses:  Principal Problem:   Hypotension Active Problems:   Diabetes mellitus type 2, uncontrolled   Discharge Condition: improved/stable  Diet recommendation:ca  Filed Weights   08/23/13 2031 08/24/13 0015  Weight: 79.833 kg (176 lb) 79.5 kg (175 lb 4.3 oz)    History of present illness:  Pt is a 68yo female with history of Brain tumor; Hypertension; Preop cardiovascular exam; Edema; Tobacco abuse; Ejection fraction; Sleep apnea; Hepatitis (1970's); Anxiety; Lichen planus; Nasal congestion; Diabetes mellitus without complication; Insomnia; Renal disorder; Spinal cord stimulator status; Chronic knee pain; Chronic back pain; Depression; Cancer (2006); and Arthritis.  She presented to ED with complaints of generalized weakness at home, when she checked her blood pressure at home it wasn't 80's so she came to the hospital. She denies chest pain no shortness of breath no nausea vomiting or diarrhea. In the ED patient was given IV fluids and her pressure improved. Orthostatics were checked which were normal. EKG was done, discussed with ED physician Dr. Christy Gentles, the EKG shows QTC prolongation, 493.  Patient does take Thorazine 150 mg at bedtime for sleep. Patient has a history of chronic back pain, at this time patient is denying any weakness, she denies seizure, no blurred vision no passing out episode. Patient at this time can walk without difficulty. Patient says that she has been eating and drinking fine over the past few days but  today she missed her dinner. She also denies changes in medications, no checking the history patient was taking lisinopril 10 mg daily in November 2014 and now she's taking 20 mg daily.   Hospital Course:   Hypotension  -meds vs orthostasis, infection unlikely and hgb stable  -hypotension has resolved after the IV fluids.  -Thorazine which she takes for sleep was held on admission, given borderline QTC prolongation 494 admission, and also it can cause orthostatic hypotension. orthostatics checked in the ED were normal   -troponins negative x3  -Patient was hydrated with IVF, Norvasc at [facility] and she was continued on lisinopril.  -Her followup QTC is improved to 455, blood pressures remaining stable on lisinopril and she is asymptomatic. -The impression was that her medications were moderately the etiology of her symptoms, she's been instructed to DC the Norvasc on discharge and followup with her PCP for continued monitoring of her blood pressures A. and further adjustment of her medications as clinically appropriate Weakness  improved at this time.  -PT/OT was consulted and saw patient and no skilled needs Diabetes mellitus  Her Accu-Cheks were monitored and she was covered with sliding scale, and on followup her metformin was resumed. Upon discharge she is to resume her Invokana as well and follow up with her PCP   Procedures:  none  Consultations:  none  Discharge Exam: Filed Vitals:   08/25/13 0520  BP: 121/82  Pulse: 66  Temp: 97.5 F (36.4 C)  Resp: 20   Exam:  General: alert & oriented x 3 In NAD  Cardiovascular: RRR, nl S1 s2  Respiratory: CTAB  Abdomen: soft +BS  NT/ND, no masses palpable  Extremities: No cyanosis and no edema    Discharge Instructions You were cared for by a hospitalist during your hospital stay. If you have any questions about your discharge medications or the care you received while you were in the hospital after you are discharged, you can call  the unit and asked to speak with the hospitalist on call if the hospitalist that took care of you is not available. Once you are discharged, your primary care physician will handle any further medical issues. Please note that NO REFILLS for any discharge medications will be authorized once you are discharged, as it is imperative that you return to your primary care physician (or establish a relationship with a primary care physician if you do not have one) for your aftercare needs so that they can reassess your need for medications and monitor your lab values.  Discharge Instructions   Diet -carb modified    Complete by:  As directed      Increase activity slowly    Complete by:  As directed             Medication List    STOP taking these medications       amLODipine 5 MG tablet  Commonly known as:  NORVASC      TAKE these medications       ALPRAZolam 0.5 MG tablet  Commonly known as:  XANAX  Take 0.5 mg by mouth 3 (three) times daily.     aspirin EC 81 MG tablet  Take 1 tablet (81 mg total) by mouth daily.     BC HEADACHE 325-95-16 MG Tabs  Generic drug:  Aspirin-Salicylamide-Caffeine  Take 1 packet by mouth daily as needed (for pain).     buPROPion 150 MG 12 hr tablet  Commonly known as:  WELLBUTRIN SR  Take 150 mg by mouth 2 (two) times daily.     chlorproMAZINE 50 MG tablet  Commonly known as:  THORAZINE  Take 3 tablets (150 mg total) by mouth at bedtime.     INVOKANA 100 MG Tabs  Generic drug:  Canagliflozin  Take 100 mg by mouth daily.     lisinopril 20 MG tablet  Commonly known as:  PRINIVIL,ZESTRIL  Take 20 mg by mouth daily.     metFORMIN 500 MG tablet  Commonly known as:  GLUCOPHAGE  Take 500 mg by mouth 2 (two) times daily.     oxyCODONE-acetaminophen 7.5-325 MG per tablet  Commonly known as:  PERCOCET  Take 1 tablet by mouth every 4 (four) hours as needed for pain.     PREVAGEN PO  Take 1 tablet by mouth daily.       No Known  Allergies    The results of significant diagnostics from this hospitalization (including imaging, microbiology, ancillary and laboratory) are listed below for reference.    Significant Diagnostic Studies: Dg Chest Portable 1 View  08/23/2013   CLINICAL DATA:  68 year old female with weakness shortness of Breath hypotension. Initial encounter.  EXAM: PORTABLE CHEST - 1 VIEW  COMPARISON:  01/29/2013 and earlier.  FINDINGS: Portable AP semi upright view at 2102 hrs. Stable lung volumes. Stable cardiac size and mediastinal contours. Faintly visible thoracic spinal stimulator. No pneumothorax, pleural effusion, consolidation, or overt pulmonary edema identified. There is a new faint double density at the left cardiophrenic angle. Burtis Junes this is artifact due to patient positioning. Visualized tracheal air column is within normal limits.  IMPRESSION: No acute cardiopulmonary abnormality.  Electronically Signed   By: Lars Pinks M.D.   On: 08/23/2013 21:18    Microbiology: No results found for this or any previous visit (from the past 240 hour(s)).   Labs: Basic Metabolic Panel:  Recent Labs Lab 08/23/13 2128 08/24/13 0537 08/25/13 0526  NA 141 143 145  K 3.8 3.9 4.5  CL 103 106 108  CO2 22 22 25   GLUCOSE 220* 157* 159*  BUN 16 14 13   CREATININE 1.16* 1.06 1.09  CALCIUM 9.0 8.5 8.9   Liver Function Tests:  Recent Labs Lab 08/23/13 2128 08/24/13 0537  AST 14 12  ALT 7 8  ALKPHOS 97 89  BILITOT 0.2* <0.2*  PROT 7.6 6.9  ALBUMIN 3.4* 3.2*   No results found for this basename: LIPASE, AMYLASE,  in the last 168 hours No results found for this basename: AMMONIA,  in the last 168 hours CBC:  Recent Labs Lab 08/23/13 2128 08/24/13 0537 08/25/13 0526  WBC 8.8 8.7 7.1  NEUTROABS 6.6  --   --   HGB 11.5* 10.9* 11.2*  HCT 35.8* 35.0* 35.7*  MCV 83.6 85.6 85.6  PLT 293 298 303   Cardiac Enzymes:  Recent Labs Lab 08/23/13 2128 08/23/13 2347 08/24/13 0537 08/24/13 1126   TROPONINI <0.30 <0.30 <0.30 <0.30   BNP: BNP (last 3 results) No results found for this basename: PROBNP,  in the last 8760 hours CBG:  Recent Labs Lab 08/24/13 1108 08/24/13 1657 08/24/13 2126 08/25/13 0740 08/25/13 1139  GLUCAP 157* 116* 146* 162* 143*       Signed:  Melvyn Hommes C Trung Wenzl  [facility] 08/25/2013, 12:51 PM

## 2013-09-20 IMAGING — CT CT ABD-PELV W/ CM
2 of 4 series · 16 of 46 positions shown, 18 images · IV contrast (Omnipaque 300)
Comparison: CT abdomen pelvis of 08/20/2004.

CLINICAL DATA: Nausea, vomiting, fatigue, decreased appetite and
weakness, history of renal carcinoma

CT ABDOMEN AND PELVIS WITH CONTRAST
TECHNIQUE: Multidetector CT imaging of the abdomen and pelvis was
performed following the standard protocol during bolus
administration of intravenous contrast.
Contrast: 100mL OMNIPAQUE IOHEXOL 300 MG/ML  SOLN

[Series 2: abd_pel_with 5.0 b40f · axial · 0.79mm/px · z∈[-360,-4]mm · 13 of 79 slices shown, 15 images]
[im 4/79  soft-tissue]
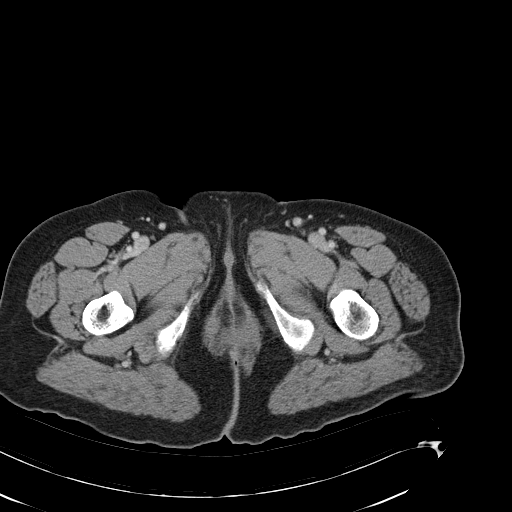
[im 4/79  bone]
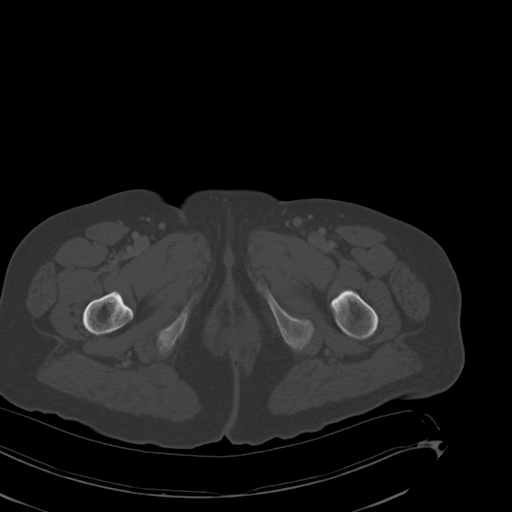
[im 12/79  soft-tissue]
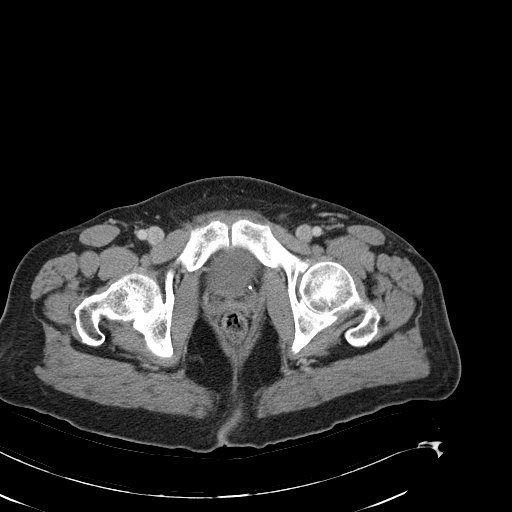
[im 15/79  soft-tissue]
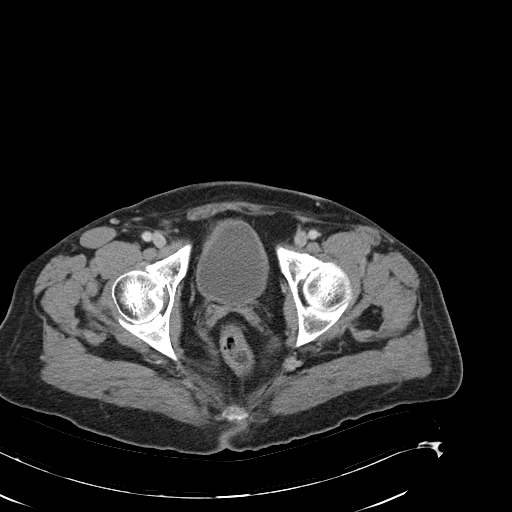
[im 23/79  soft-tissue]
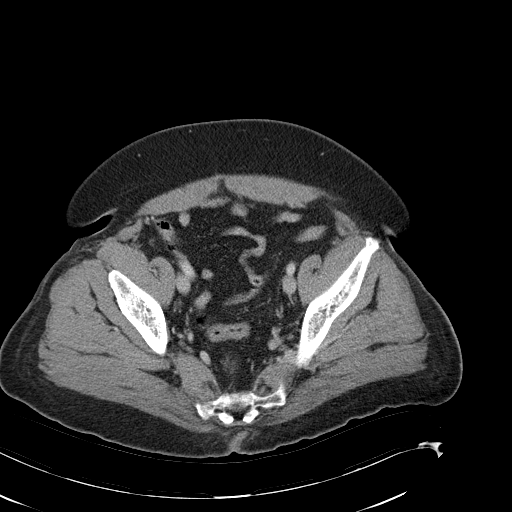
[im 27/79  soft-tissue]
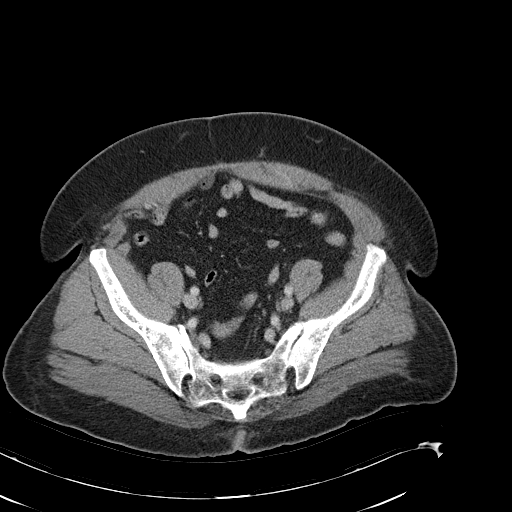
[im 34/79  soft-tissue]
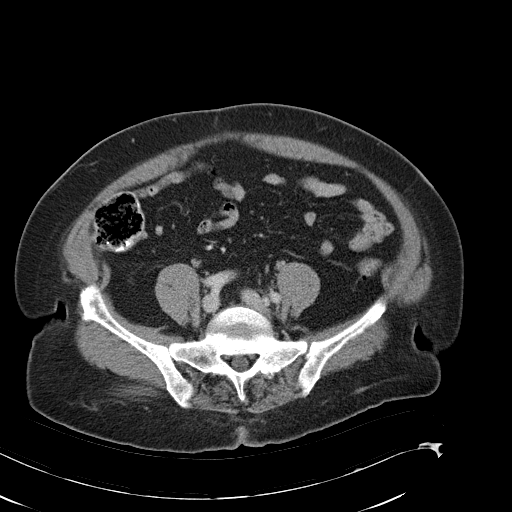
[im 41/79  soft-tissue]
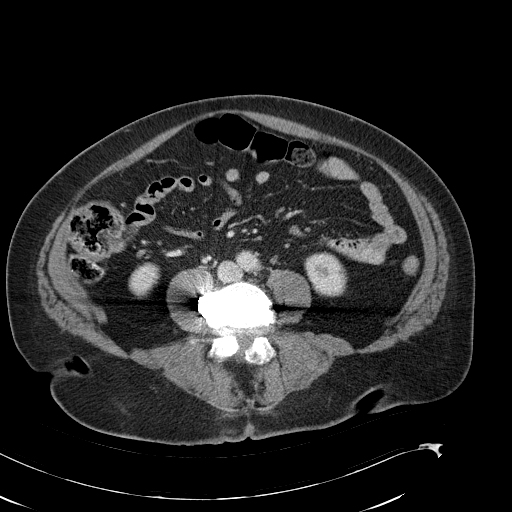
[im 45/79  soft-tissue]
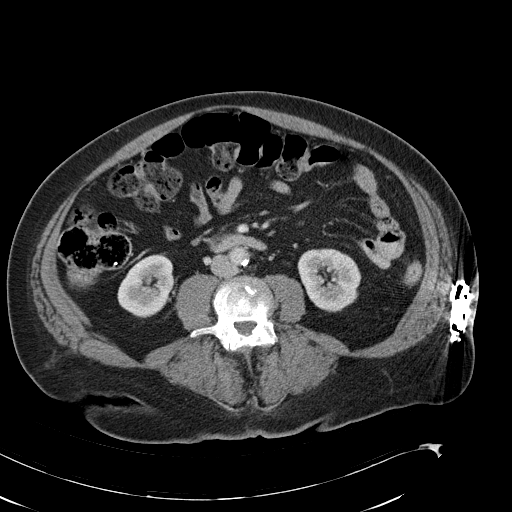
[im 53/79  soft-tissue]
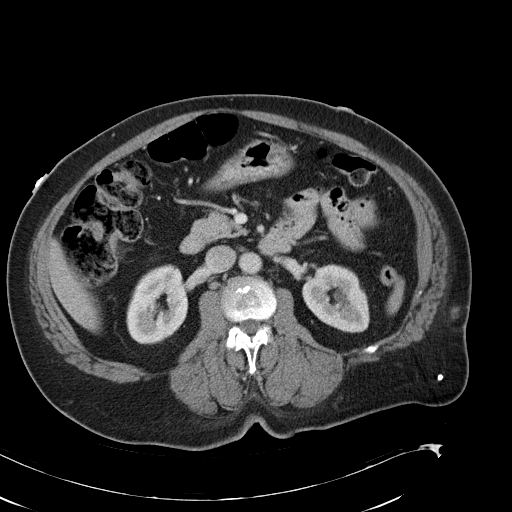
[im 53/79  bone]
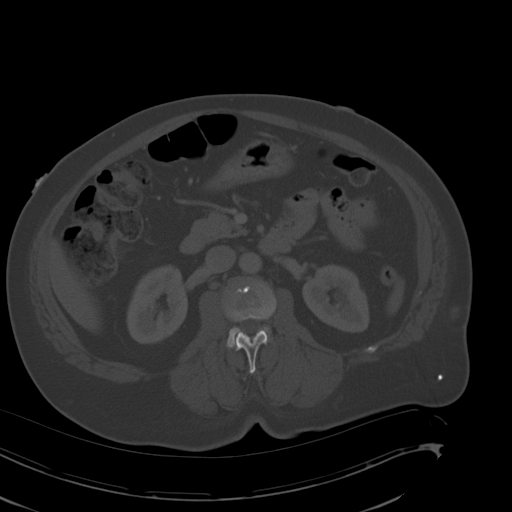
[im 56/79  soft-tissue]
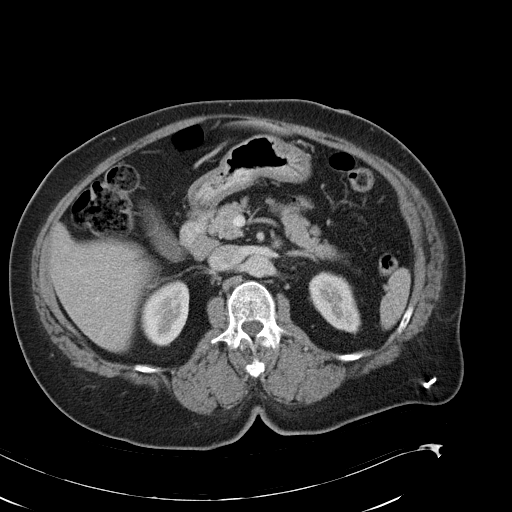
[im 64/79  soft-tissue]
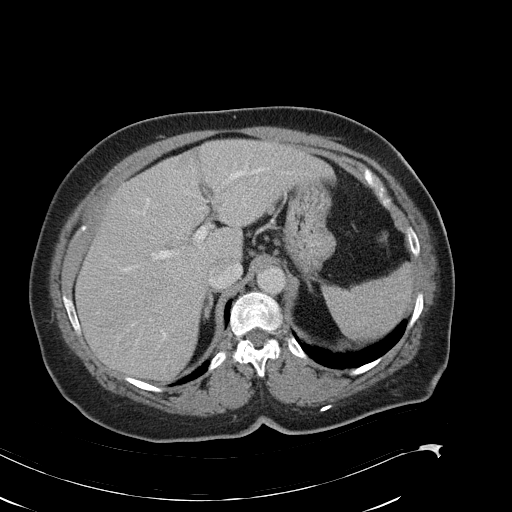
[im 67/79  soft-tissue]
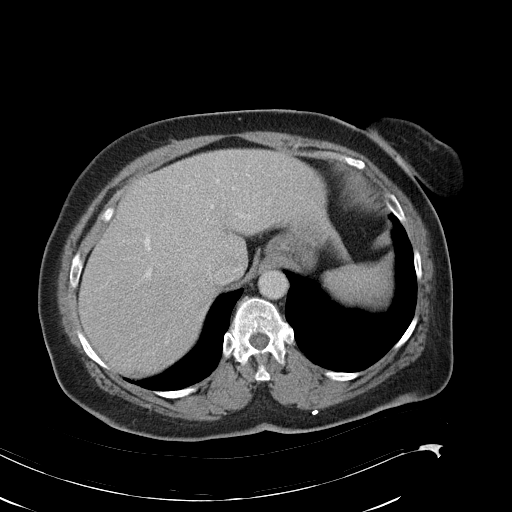
[im 75/79  soft-tissue]
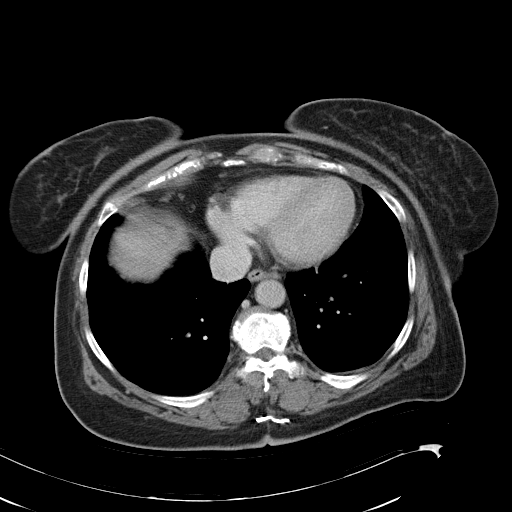

[Series 4: abd_pel_with 3.0 spo cor · coronal · 0.75mm/px · 3 of 84 slices shown]
[im 28/84  soft-tissue]
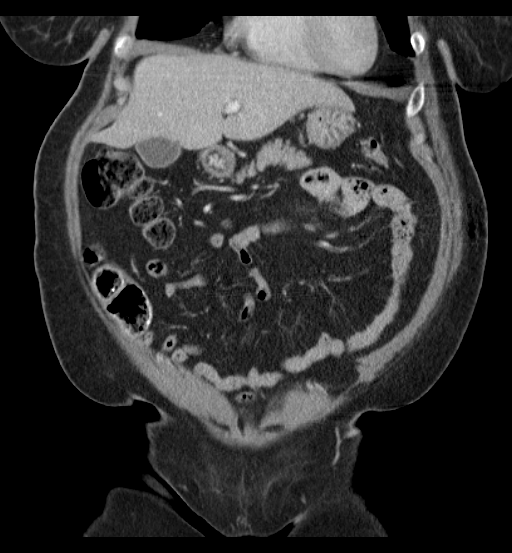
[im 37/84  soft-tissue]
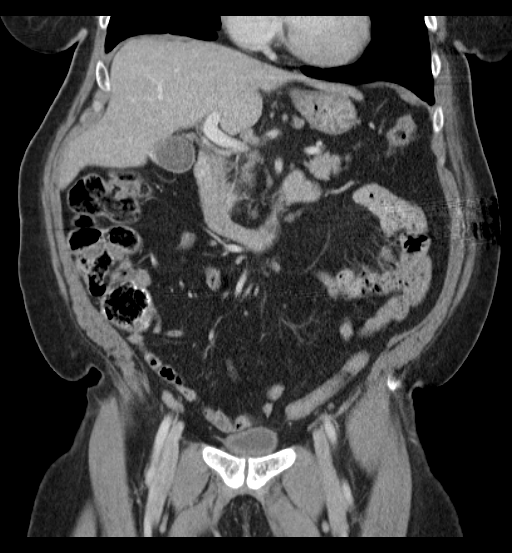
[im 47/84  soft-tissue]
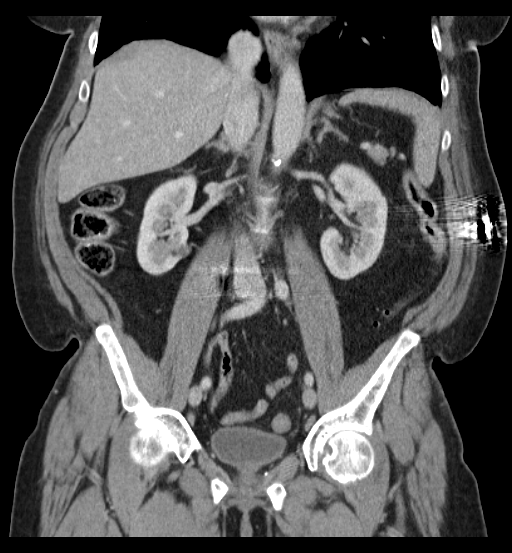

[16 of 46 positions shown; findings below may reference images not displayed]

FINDINGS: The lung bases are clear.  The liver enhances with no
focal abnormality and no ductal dilatation is seen.  There is a
large gallstone within the gallbladder measuring 3.5 cm in maximum
diameter containing some gas centrally.  The gallbladder wall is
slightly prominent and ultrasound may be helpful to assess for
developing acute cholecystitis.  No ductal dilatation is seen.  The
pancreas is normal in size and the pancreatic duct is not dilated.
The adrenal glands and spleen are unremarkable.  The stomach is
decompressed and cannot be evaluated.  The kidneys enhance with no
calculus or hydronephrosis.  The previously noted low attenuation
mass in the lower pole of the right kidney medially is relatively
stable measuring 13 mm in maximum diameter. This low attenuation
right renal lesion measures 24 HU on the portal venous phase, and
12 HU on the delayed images.  The abdominal aorta is normal in
caliber with mild atheromatous change present.  Neurostimulator
electrodes are noted in the lower thoracic spinal canal.  No
adenopathy is seen.

The urinary bladder is not well distended.  The uterus has
previously been resected.  No adnexal lesion is seen.  No fluid is
noted within the pelvis.  The colon is largely decompressed and
difficult to evaluate.  No definite mass is seen.  The appendix and
terminal ileum are unremarkable. Lumbar spine fusion is noted at
the L4-5 level.
IMPRESSION: 1.  3.5 cm gallstone within the gallbladder.  The gallbladder wall
is slightly irregular.  Cannot exclude developing acute
cholecystitis.
2.  No significant change in low attenuation lower pole right renal
lesion when compared to the CT from [DATE].  The appendix and terminal ileum are unremarkable.

## 2013-12-05 ENCOUNTER — Encounter (HOSPITAL_COMMUNITY): Payer: Self-pay | Admitting: Emergency Medicine

## 2013-12-05 ENCOUNTER — Emergency Department (HOSPITAL_COMMUNITY)
Admission: EM | Admit: 2013-12-05 | Discharge: 2013-12-05 | Disposition: A | Discharge status: Home / Self Care | Payer: Medicare Other | Attending: Emergency Medicine | Admitting: Emergency Medicine

## 2013-12-05 DIAGNOSIS — R609 Edema, unspecified: Secondary | ICD-10-CM | POA: Insufficient documentation

## 2013-12-05 DIAGNOSIS — G4730 Sleep apnea, unspecified: Secondary | ICD-10-CM | POA: Insufficient documentation

## 2013-12-05 DIAGNOSIS — F411 Generalized anxiety disorder: Secondary | ICD-10-CM | POA: Diagnosis not present

## 2013-12-05 DIAGNOSIS — M7989 Other specified soft tissue disorders: Secondary | ICD-10-CM | POA: Diagnosis present

## 2013-12-05 DIAGNOSIS — F3289 Other specified depressive episodes: Secondary | ICD-10-CM | POA: Diagnosis not present

## 2013-12-05 DIAGNOSIS — I10 Essential (primary) hypertension: Secondary | ICD-10-CM | POA: Insufficient documentation

## 2013-12-05 DIAGNOSIS — Z87448 Personal history of other diseases of urinary system: Secondary | ICD-10-CM | POA: Insufficient documentation

## 2013-12-05 DIAGNOSIS — Z87891 Personal history of nicotine dependence: Secondary | ICD-10-CM | POA: Insufficient documentation

## 2013-12-05 DIAGNOSIS — E119 Type 2 diabetes mellitus without complications: Secondary | ICD-10-CM | POA: Insufficient documentation

## 2013-12-05 DIAGNOSIS — Z7982 Long term (current) use of aspirin: Secondary | ICD-10-CM | POA: Insufficient documentation

## 2013-12-05 DIAGNOSIS — G8929 Other chronic pain: Secondary | ICD-10-CM | POA: Insufficient documentation

## 2013-12-05 DIAGNOSIS — Z8619 Personal history of other infectious and parasitic diseases: Secondary | ICD-10-CM | POA: Insufficient documentation

## 2013-12-05 DIAGNOSIS — F329 Major depressive disorder, single episode, unspecified: Secondary | ICD-10-CM | POA: Diagnosis not present

## 2013-12-05 DIAGNOSIS — Z8669 Personal history of other diseases of the nervous system and sense organs: Secondary | ICD-10-CM | POA: Diagnosis not present

## 2013-12-05 DIAGNOSIS — Z79899 Other long term (current) drug therapy: Secondary | ICD-10-CM | POA: Diagnosis not present

## 2013-12-05 DIAGNOSIS — Z85528 Personal history of other malignant neoplasm of kidney: Secondary | ICD-10-CM | POA: Diagnosis not present

## 2013-12-05 DIAGNOSIS — M129 Arthropathy, unspecified: Secondary | ICD-10-CM | POA: Diagnosis not present

## 2013-12-05 LAB — CBC WITH DIFFERENTIAL/PLATELET
BASOS PCT: 1 % (ref 0–1)
Basophils Absolute: 0.1 10*3/uL (ref 0.0–0.1)
Eosinophils Absolute: 0.5 10*3/uL (ref 0.0–0.7)
Eosinophils Relative: 5 % (ref 0–5)
HCT: 37.1 % (ref 36.0–46.0)
HEMOGLOBIN: 11.8 g/dL — AB (ref 12.0–15.0)
Lymphocytes Relative: 17 % (ref 12–46)
Lymphs Abs: 1.6 10*3/uL (ref 0.7–4.0)
MCH: 26.5 pg (ref 26.0–34.0)
MCHC: 31.8 g/dL (ref 30.0–36.0)
MCV: 83.4 fL (ref 78.0–100.0)
MONO ABS: 0.6 10*3/uL (ref 0.1–1.0)
MONOS PCT: 7 % (ref 3–12)
NEUTROS ABS: 6.5 10*3/uL (ref 1.7–7.7)
Neutrophils Relative %: 70 % (ref 43–77)
Platelets: 323 10*3/uL (ref 150–400)
RBC: 4.45 MIL/uL (ref 3.87–5.11)
RDW: 15.5 % (ref 11.5–15.5)
WBC: 9.3 10*3/uL (ref 4.0–10.5)

## 2013-12-05 LAB — COMPREHENSIVE METABOLIC PANEL
ALBUMIN: 3.7 g/dL (ref 3.5–5.2)
ALT: 12 U/L (ref 0–35)
ANION GAP: 12 (ref 5–15)
AST: 27 U/L (ref 0–37)
Alkaline Phosphatase: 81 U/L (ref 39–117)
BILIRUBIN TOTAL: 0.2 mg/dL — AB (ref 0.3–1.2)
BUN: 11 mg/dL (ref 6–23)
CHLORIDE: 101 meq/L (ref 96–112)
CO2: 29 meq/L (ref 19–32)
CREATININE: 0.84 mg/dL (ref 0.50–1.10)
Calcium: 9.8 mg/dL (ref 8.4–10.5)
GFR calc Af Amer: 83 mL/min — ABNORMAL LOW (ref >90)
GFR, EST NON AFRICAN AMERICAN: 71 mL/min — AB (ref >90)
Glucose, Bld: 82 mg/dL (ref 70–99)
Potassium: 3.6 mEq/L — ABNORMAL LOW (ref 3.7–5.3)
Sodium: 142 mEq/L (ref 137–147)
Total Protein: 8 g/dL (ref 6.0–8.3)

## 2013-12-05 MED ORDER — HYDROCHLOROTHIAZIDE 25 MG PO TABS
25.0000 mg | ORAL_TABLET | Freq: Every day | ORAL | Status: DC
  Administered 2013-12-05: 25 mg via ORAL
  Filled 2013-12-05 (×4): qty 1

## 2013-12-05 MED ORDER — POTASSIUM CHLORIDE CRYS ER 20 MEQ PO TBCR
20.0000 meq | EXTENDED_RELEASE_TABLET | Freq: Once | ORAL | Status: AC
  Administered 2013-12-05: 20 meq via ORAL
  Filled 2013-12-05: qty 1

## 2013-12-05 MED ORDER — HYDROCHLOROTHIAZIDE 25 MG PO TABS: 25.0000 mg | ORAL_TABLET | Freq: Every day | ORAL | Status: DC

## 2013-12-05 NOTE — Discharge Instructions (Signed)
Follow up with your md next week. °

## 2013-12-05 NOTE — ED Provider Notes (Addendum)
CSN: [id number]     Arrival date & time 12/05/13  1711 History   First MD Initiated Contact with Patient 12/05/13 1756   This chart was scribed for Maudry Diego, MD by Rosary Lively, ED scribe. This patient was seen in room [facility] and the patient's care was started at 6:15 PM.    Chief Complaint  Patient presents with  . Leg Swelling    Patient is a 68 y.o. female presenting with leg pain. The history is provided by the patient (pt with swelling to both legs). No language interpreter was used.  Leg Pain Lower extremity pain location: ankles. Injury: no   Pain details:    Quality:  Aching   Radiates to:  Does not radiate   Severity:  Mild   Onset quality:  Gradual   Timing:  Constant Associated symptoms: no back pain and no fatigue    HPI Comments:  Rebecca Richards is a 68 y.o. female who presents to the Emergency Department complaining of increased BP of 185/110, with associated symptoms of swelling of the lower extremities that has taken place for a while. Pt also reports that she is having a difficult time ambulating, onset 1 year ago, after she was diagnosed with diabetes. Pt reports that she drank an energy drink on today.    Past Medical History  Diagnosis Date  . Brain tumor     Surgery for benign brain tumor in the remote past  . Hypertension   . Preop cardiovascular exam     Cardiac clearance for knee surgery, January, 2013  . Edema   . Tobacco abuse   . Ejection fraction     EF 70%, echo, February, 2013  . Sleep apnea     uses Cpap  . Hepatitis 1970's    hep c  . Anxiety   . 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 Nasal congestion   . Diabetes mellitus without complication   . Insomnia   . 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 Spinal cord stimulator status     for pain   . Chronic knee pain   . Chronic back pain     with radiculopathy bilat legs  . Depression   . 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 Arthritis     Past Surgical History  Procedure Laterality Date  . Abdominal hysterectomy    . Knee surgery    . Nose surgery    . Back surgery      pt has stimulator in lower back and can't have an MRI  . Total knee arthroplasty  07/21/2011    Procedure: TOTAL KNEE ARTHROPLASTY;  Surgeon: Yvette Rack., MD;  Location: [facility];  Service: Orthopedics;  Laterality: Left;  . 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 Cholecystectomy N/A 09/30/2012    Procedure: LAPAROSCOPIC CHOLECYSTECTOMY;  Surgeon: Donato Heinz, MD;  Location: [facility];  Service: General;  Laterality: N/A;   Family History  Problem Relation Age of Onset  . Cancer Mother   . Hypertension Mother   . Anesthesia problems Neg Hx   . Diabetes Mother   . Diabetes Brother    History  Substance Use Topics  . Smoking status: Former Smoker -- 0.50 packs/day for 10 years    Types: Cigarettes    Quit date: 02/19/2012  . Smokeless tobacco: Never Used  .  Alcohol Use: No     Comment: none since 03-28-2011   OB History   Grav Para Term Preterm Abortions TAB SAB Ect Mult Living   1 1 1       1      Review of Systems  Constitutional: Negative for appetite change and fatigue.  HENT: Negative for congestion, ear discharge and sinus pressure.   Eyes: Negative for discharge.  Respiratory: Negative for cough.   Cardiovascular: Positive for leg swelling. Negative for chest pain.  Gastrointestinal: Negative for abdominal pain and diarrhea.  Genitourinary: Negative for frequency and hematuria.  Musculoskeletal: Positive for gait problem. Negative for back pain.  Skin: Negative for rash.  Neurological: Negative for seizures and headaches.  Psychiatric/Behavioral: Negative for hallucinations.      Allergies  Review of patient's allergies indicates no known allergies.  Home Medications   Prior to Admission medications   Medication Sig Start Date End Date Taking? Authorizing Provider  ALPRAZolam Duanne Moron) 0.5 MG tablet Take 0.5 mg by mouth  3 (three) times daily.    Historical Provider, MD  Apoaequorin (PREVAGEN PO) Take 1 tablet by mouth daily.    Historical Provider, MD  aspirin EC 81 MG tablet Take 1 tablet (81 mg total) by mouth daily. 01/31/13   Kathie Dike, MD  Aspirin-Salicylamide-Caffeine (BC HEADACHE) 325-95-16 MG TABS Take 1 packet by mouth daily as needed (for pain).    Historical Provider, MD  buPROPion (WELLBUTRIN SR) 150 MG 12 hr tablet Take 150 mg by mouth 2 (two) times daily.    Historical Provider, MD  Canagliflozin (INVOKANA) 100 MG TABS Take 100 mg by mouth daily.    Historical Provider, MD  chlorproMAZINE (THORAZINE) 50 MG tablet Take 3 tablets (150 mg total) by mouth at bedtime. 01/31/13   Kathie Dike, MD  lisinopril (PRINIVIL,ZESTRIL) 20 MG tablet Take 20 mg by mouth daily.    Historical Provider, MD  metFORMIN (GLUCOPHAGE) 500 MG tablet Take 500 mg by mouth 2 (two) times daily.    Historical Provider, MD  oxyCODONE-acetaminophen (PERCOCET) 7.5-325 MG per tablet Take 1 tablet by mouth every 4 (four) hours as needed for pain.    Historical Provider, MD   BP 163/91  Pulse 93  Temp(Src) 98.8 F (37.1 C) (Oral)  Resp 20  Ht 5\' 1"  (1.549 m)  Wt 180 lb (81.647 kg)  BMI 34.03 kg/m2  SpO2 100% Physical Exam  Constitutional: She is oriented to person, place, and time. She appears well-developed.  HENT:  Head: Normocephalic.  Eyes: Conjunctivae and EOM are normal. No scleral icterus.  Neck: Neck supple. No thyromegaly present.  Cardiovascular: Normal rate and regular rhythm.  Exam reveals no gallop and no friction rub.   No murmur heard. 1+ edema in ankles  Pulmonary/Chest: No stridor. She has no wheezes. She has no rales. She exhibits no tenderness.  Abdominal: She exhibits no distension. There is no tenderness. There is no rebound.  Musculoskeletal: Normal range of motion. She exhibits no edema.  Lymphadenopathy:    She has no cervical adenopathy.  Neurological: She is oriented to person, place, and  time. She exhibits normal muscle tone. Coordination normal.  Skin: No rash noted. No erythema.  Psychiatric: She has a normal mood and affect. Her behavior is normal.    ED Course  Procedures  DIAGNOSTIC STUDIES: Oxygen Saturation is 100% on RA, normal by my interpretation.  COORDINATION OF CARE: 6:18 PM-Discussed treatment plan with pt  Labs Review Labs Reviewed - No data to display  Imaging Review No results found.   EKG Interpretation None      MDM   Final diagnoses:  None   Mild edema and htn,  Start hctz 25mg  and follow up with pcp   The chart was scribed for me under my direct supervision.  I personally performed the history, physical, and medical decision making and all procedures in the evaluation of this patient.Maudry Diego, MD 12/05/13 1956  Maudry Diego, MD 12/05/13 [phone]

## 2013-12-05 NOTE — ED Notes (Signed)
Pt co high blood pressure and bilateral leg swelling x 1 day.

## 2014-01-09 ENCOUNTER — Encounter: Payer: Self-pay | Admitting: Neurology

## 2014-01-09 ENCOUNTER — Ambulatory Visit (INDEPENDENT_AMBULATORY_CARE_PROVIDER_SITE_OTHER): Payer: Medicare Other | Admitting: Neurology

## 2014-01-09 VITALS — BP 126/66 | HR 68 | Ht 61.0 in | Wt 175.0 lb

## 2014-01-09 DIAGNOSIS — E1149 Type 2 diabetes mellitus with other diabetic neurological complication: Secondary | ICD-10-CM

## 2014-01-09 DIAGNOSIS — E1142 Type 2 diabetes mellitus with diabetic polyneuropathy: Secondary | ICD-10-CM

## 2014-01-09 MED ORDER — GABAPENTIN 300 MG PO CAPS: 600.0000 mg | ORAL_CAPSULE | Freq: Three times a day (TID) | ORAL | Status: DC

## 2014-01-09 NOTE — Patient Instructions (Signed)
1.  Start taking neurontin 300mg  as follows:   Morning      Afternoon          Evening     Week 1: 300mg  (1 tab)  300mg  (1 tab)  300mg  (1 tab)     Week 2: 300mg  (1 tab)  300mg  (1 tab)  600mg  (2 tab)     Week 3: 300mg  (1 tab)  600mg  (2 tab)  600mg  (2 tab)     Week 4: 600mg  (2 tab)  600mg  (2 tab)  600mg  (2 tab)  2.  Call with update in 4-6 weeks  3.  Return to clinic in 2-3 months

## 2014-01-09 NOTE — Progress Notes (Signed)
Note faxed.

## 2014-01-09 NOTE — Progress Notes (Signed)
[facility] Clinic Note - Initial Visit   Date: 01/09/2014  Rebecca Richards MRN: [id number] DOB: 11-17-1945   Dear Dr. Manuella Ghazi:  Thank you for your kind referral of Rebecca Richards for consultation of bilateral leg pain. Although her history is well known to you, please allow [place] to reiterate it for the purpose of our medical record. The patient was accompanied to the clinic by self.    History of Present Illness: Rebecca Richards is a 68 y.o. right-handed [place] American female with history of poorly controlled diabetes mellitus (HbA1c 11.4), hypertension, and depression presenting for evaluation of bilateral leg pain.    Starting in 2014 she was diagnosed with diabetes and around the same time, she developed weakness of the knees and finds it difficult to get up from a squatting position. About 5-10 years prior to being diagnosed with diabetes, she has noticed numbness/tingling of the legs from the knees down, as if they are asleep. Symptoms are intermittent and worse at night. Denies any alleviating or exacerbating factors.  She was started on neurontin 300mg  BID and has not noticed any benefit.  She endorses low back pain.  She also endorses occasional paresthesias of the hands.  She has a remote history of left CP angle meningioma s/p resection in 2011.   Out-side paper records, electronic medical record, and images have been reviewed where available and summarized as:  CT head wo contrast 06/03/2013:  negative US carotids 01/29/2014: no hemodynamically significant stenosis  Lab Results  Component Value Date   HGBA1C 11.4* 01/29/2013   Lab Results  Component Value Date   TSH 0.602 08/23/2013   Lab Results  Component Value Date   VITAMINB12 1680* 01/29/2013     Past Medical History  Diagnosis Date  . Brain tumor     Surgery for benign brain tumor in the remote past  . Hypertension   . Preop cardiovascular exam     Cardiac clearance for knee  surgery, January, 2013  . Edema   . Tobacco abuse   . Ejection fraction     EF 70%, echo, February, 2013  . Sleep apnea     uses Cpap  . Hepatitis 1970's    hep c  . Anxiety   . 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 Nasal congestion   . Diabetes mellitus without complication   . Insomnia   . 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 Spinal cord stimulator status     for pain   . Chronic knee pain   . Chronic back pain     with radiculopathy bilat legs  . Depression   . 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 Arthritis     Past Surgical History  Procedure Laterality Date  . Abdominal hysterectomy    . Knee surgery    . Nose surgery    . Back surgery      pt has stimulator in lower back and can't have an MRI  . Total knee arthroplasty  07/21/2011    Procedure: TOTAL KNEE ARTHROPLASTY;  Surgeon: Yvette Rack., MD;  Location: [facility];  Service: Orthopedics;  Laterality: Left;  . 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 Cholecystectomy N/A 09/30/2012    Procedure: LAPAROSCOPIC CHOLECYSTECTOMY;  Surgeon: Donato Heinz, MD;  Location: [facility];  Service: General;  Laterality: N/A;  Medications:  Current Outpatient Prescriptions on File Prior to Visit  Medication Sig Dispense Refill  . Aspirin-Salicylamide-Caffeine (BC HEADACHE) 325-95-16 MG TABS Take 1 packet by mouth daily as needed (for pain).      . chlorproMAZINE (THORAZINE) 50 MG tablet Take 3 tablets (150 mg total) by mouth at bedtime.  21 tablet  0  . diazepam (VALIUM) 5 MG tablet Take 5 mg by mouth at bedtime as needed and may repeat dose one time if needed.       . hydrochlorothiazide (HYDRODIURIL) 25 MG tablet Take 1 tablet (25 mg total) by mouth daily.  30 tablet  0  . HYDROcodone-acetaminophen (NORCO/VICODIN) 5-325 MG per tablet Take 1 tablet by mouth 2 (two) times a week.       Marland Kitchen lisinopril (PRINIVIL,ZESTRIL) 20 MG tablet Take 20 mg by mouth daily.      . metFORMIN (GLUCOPHAGE)  500 MG tablet Take 500 mg by mouth 2 (two) times daily.      . Vilazodone HCl (VIIBRYD) 40 MG TABS Take 40 mg by mouth daily.      . Insulin Detemir (LEVEMIR FLEXTOUCH) 100 UNIT/ML Pen Inject 20 Units into the skin daily at 10 pm.       No current facility-administered medications on file prior to visit.    Allergies: No Known Allergies  Family History: Family History  Problem Relation Age of Onset  . Cancer Mother   . Hypertension Mother   . Anesthesia problems Neg Hx   . Diabetes Mother   . Diabetes Brother     Social History: History   Social History  . Marital Status: Single    Spouse Name: N/A    Number of Children: N/A  . Years of Education: N/A   Occupational History  . Not on file.   Social History Main Topics  . Smoking status: Former Smoker -- 0.50 packs/day for 10 years    Types: Cigarettes    Quit date: 12/26/2013  . Smokeless tobacco: Never Used  . Alcohol Use: No     Comment: none since 03-28-2011  . 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 Sexual Activity: No   Other Topics Concern  . Not on file   Social History Narrative  . No narrative on file    Review of Systems:  CONSTITUTIONAL: No fevers, chills, night sweats, or weight loss.   EYES: No visual changes or eye pain ENT: No hearing changes.  No history of nose bleeds.   RESPIRATORY: No cough, wheezing and shortness of breath.   CARDIOVASCULAR: Negative for chest pain, and palpitations.   GI: Negative for abdominal discomfort, blood in stools or black stools.  No recent change in bowel habits.   GU:  No history of incontinence.   MUSCLOSKELETAL: + history of joint pain or swelling.  No myalgias.   SKIN: Negative for lesions, rash, and itching.   HEMATOLOGY/ONCOLOGY: Negative for prolonged bleeding, bruising easily, and swollen nodes.   ENDOCRINE: Negative for cold or heat intolerance, polydipsia or goiter.   PSYCH:  No depression or anxiety symptoms.   NEURO: As  Above.   Vital Signs:  BP 126/66  Pulse 68  Ht 5\' 1"  (1.549 m)  Wt 175 lb (79.379 kg)  BMI 33.08 kg/m2  Neurological Exam: MENTAL STATUS including orientation to time, place, person, recent and remote memory, attention span and concentration, language, and fund of knowledge is normal.  Speech is not dysarthric.  CRANIAL NERVES: II:  No visual field defects.  Unremarkable fundi.   III-IV-VI: Pupils equal round and reactive to light.  Normal conjugate, extra-ocular eye movements in all directions of gaze.  No nystagmus.  V:  Normal facial sensation.   VII:  Normal facial symmetry and movements.  No pathologic facial reflexes.  VIII:  Normal hearing and vestibular function.   IX-X:  Normal palatal movement.   XI:  Normal shoulder shrug and head rotation.   XII:  Normal tongue strength and range of motion, no deviation or fasciculation.  MOTOR: Motor strength is 5/5 throughout. No atrophy, fasciculations or abnormal movements.  No pronator drift.  Tone is normal.    MSRs:  Right                                                                 Left brachioradialis 3+  brachioradialis 3+  biceps 3+  biceps 3+  triceps 3+  triceps 3+  patellar 2+  patellar 2+  ankle jerk 1+  ankle jerk 1+  Hoffman no  Hoffman no  plantar response down  plantar response down   SENSORY:  Vibration diminished distal to knees (70% knees, 40% great toe bilaterally), pin prick also reduced in the feet.  Romberg's sign absent.   COORDINATION/GAIT: Normal finger-to- nose-finger and heel-to-shin.  Intact rapid alternating movements bilaterally.  Able to rise from a chair without using arms.  Gait narrow based and stable. Unsteady with tandem gait.    IMPRESSION: Ms. Huffine is a 68 year-old female presenting for evaluation of bilateral leg dysesthesias.  Her neurological examination shows a distal predominant large fiber peripheral neuropathy. Etiology is most likely diabetes, which is poorly controlled.  I  had extensive discussion with the patient regarding the pathogenesis, etiology, management, and natural course of neuropathy. Neuropathy tends to be slowly progressive, especially if underlying diabetes is not managed well, so I stressed the importance of tight glycemic control.  Offered electrodiagnostic testing to confirm diagnosis, but patient declined testing at this time.  For symptomatic management, I will increase her on gabapentin to 600mg  TID (titration schedule provided).  She will call with update in 4-6 weeks to determine further titration vs additional medication.  Return to clinic in 68-months.    The duration of this appointment visit was 35 minutes of face-to-face time with the patient.  Greater than 50% of this time was spent in counseling, explanation of diagnosis, planning of further management, and coordination of care.   Thank you for allowing me to participate in patient's care.  If I can answer any additional questions, I would be pleased to do so.    Sincerely,    Donika K. Posey Pronto, DO

## 2014-01-26 ENCOUNTER — Encounter: Payer: Self-pay | Admitting: Neurology

## 2014-02-26 ENCOUNTER — Telehealth: Payer: Self-pay | Admitting: Neurology

## 2014-02-26 NOTE — Telephone Encounter (Signed)
Pt canceled 04-24-13 appt and states that she is switching drs office

## 2014-04-24 ENCOUNTER — Ambulatory Visit: Payer: Medicare Other | Admitting: Neurology

## 2014-08-06 ENCOUNTER — Encounter: Payer: 59 | Admitting: Diagnostic Neuroimaging

## 2014-08-10 ENCOUNTER — Encounter: Payer: Self-pay | Admitting: Diagnostic Neuroimaging

## 2014-08-18 ENCOUNTER — Emergency Department (HOSPITAL_COMMUNITY): Payer: Medicare Other

## 2014-08-18 ENCOUNTER — Observation Stay (HOSPITAL_COMMUNITY)
Admission: EM | Admit: 2014-08-18 | Discharge: 2014-08-19 | Disposition: A | Discharge status: Home / Self Care | Payer: Medicare Other | Attending: Internal Medicine | Admitting: Internal Medicine

## 2014-08-18 ENCOUNTER — Encounter (HOSPITAL_COMMUNITY): Payer: Self-pay

## 2014-08-18 DIAGNOSIS — I10 Essential (primary) hypertension: Secondary | ICD-10-CM | POA: Diagnosis not present

## 2014-08-18 DIAGNOSIS — Z86011 Personal history of benign neoplasm of the brain: Secondary | ICD-10-CM | POA: Diagnosis not present

## 2014-08-18 DIAGNOSIS — Z794 Long term (current) use of insulin: Secondary | ICD-10-CM | POA: Insufficient documentation

## 2014-08-18 DIAGNOSIS — G4730 Sleep apnea, unspecified: Secondary | ICD-10-CM | POA: Diagnosis not present

## 2014-08-18 DIAGNOSIS — Z872 Personal history of diseases of the skin and subcutaneous tissue: Secondary | ICD-10-CM | POA: Diagnosis not present

## 2014-08-18 DIAGNOSIS — E1165 Type 2 diabetes mellitus with hyperglycemia: Secondary | ICD-10-CM | POA: Diagnosis present

## 2014-08-18 DIAGNOSIS — Z85528 Personal history of other malignant neoplasm of kidney: Secondary | ICD-10-CM | POA: Diagnosis not present

## 2014-08-18 DIAGNOSIS — N179 Acute kidney failure, unspecified: Secondary | ICD-10-CM | POA: Diagnosis not present

## 2014-08-18 DIAGNOSIS — M1990 Unspecified osteoarthritis, unspecified site: Secondary | ICD-10-CM | POA: Diagnosis not present

## 2014-08-18 DIAGNOSIS — E119 Type 2 diabetes mellitus without complications: Secondary | ICD-10-CM | POA: Diagnosis not present

## 2014-08-18 DIAGNOSIS — G4700 Insomnia, unspecified: Secondary | ICD-10-CM | POA: Diagnosis not present

## 2014-08-18 DIAGNOSIS — E872 Acidosis, unspecified: Secondary | ICD-10-CM | POA: Diagnosis present

## 2014-08-18 DIAGNOSIS — R4182 Altered mental status, unspecified: Principal | ICD-10-CM | POA: Insufficient documentation

## 2014-08-18 DIAGNOSIS — G8929 Other chronic pain: Secondary | ICD-10-CM | POA: Insufficient documentation

## 2014-08-18 DIAGNOSIS — F32A Depression, unspecified: Secondary | ICD-10-CM | POA: Diagnosis present

## 2014-08-18 DIAGNOSIS — E860 Dehydration: Secondary | ICD-10-CM | POA: Diagnosis not present

## 2014-08-18 DIAGNOSIS — Z87891 Personal history of nicotine dependence: Secondary | ICD-10-CM | POA: Insufficient documentation

## 2014-08-18 DIAGNOSIS — G4733 Obstructive sleep apnea (adult) (pediatric): Secondary | ICD-10-CM

## 2014-08-18 DIAGNOSIS — Z79899 Other long term (current) drug therapy: Secondary | ICD-10-CM | POA: Insufficient documentation

## 2014-08-18 DIAGNOSIS — I5032 Chronic diastolic (congestive) heart failure: Secondary | ICD-10-CM | POA: Diagnosis not present

## 2014-08-18 DIAGNOSIS — I5030 Unspecified diastolic (congestive) heart failure: Secondary | ICD-10-CM | POA: Diagnosis present

## 2014-08-18 DIAGNOSIS — R740 Nonspecific elevation of levels of transaminase and lactic acid dehydrogenase [LDH]: Secondary | ICD-10-CM | POA: Diagnosis not present

## 2014-08-18 DIAGNOSIS — N289 Disorder of kidney and ureter, unspecified: Secondary | ICD-10-CM | POA: Diagnosis not present

## 2014-08-18 DIAGNOSIS — Z9989 Dependence on other enabling machines and devices: Secondary | ICD-10-CM

## 2014-08-18 DIAGNOSIS — IMO0002 Reserved for concepts with insufficient information to code with codable children: Secondary | ICD-10-CM | POA: Diagnosis present

## 2014-08-18 DIAGNOSIS — F329 Major depressive disorder, single episode, unspecified: Secondary | ICD-10-CM | POA: Diagnosis present

## 2014-08-18 DIAGNOSIS — K759 Inflammatory liver disease, unspecified: Secondary | ICD-10-CM | POA: Diagnosis not present

## 2014-08-18 DIAGNOSIS — Z9981 Dependence on supplemental oxygen: Secondary | ICD-10-CM | POA: Diagnosis not present

## 2014-08-18 DIAGNOSIS — D496 Neoplasm of unspecified behavior of brain: Secondary | ICD-10-CM | POA: Diagnosis present

## 2014-08-18 DIAGNOSIS — G9340 Encephalopathy, unspecified: Secondary | ICD-10-CM | POA: Diagnosis present

## 2014-08-18 DIAGNOSIS — R7989 Other specified abnormal findings of blood chemistry: Secondary | ICD-10-CM

## 2014-08-18 LAB — CBC WITH DIFFERENTIAL/PLATELET
Basophils Absolute: 0 10*3/uL (ref 0.0–0.1)
Basophils Relative: 1 % (ref 0–1)
Eosinophils Absolute: 0.5 10*3/uL (ref 0.0–0.7)
Eosinophils Relative: 6 % — ABNORMAL HIGH (ref 0–5)
HCT: 37 % (ref 36.0–46.0)
Hemoglobin: 11.5 g/dL — ABNORMAL LOW (ref 12.0–15.0)
Lymphocytes Relative: 24 % (ref 12–46)
Lymphs Abs: 2.1 10*3/uL (ref 0.7–4.0)
MCH: 27.5 pg (ref 26.0–34.0)
MCHC: 31.1 g/dL (ref 30.0–36.0)
MCV: 88.5 fL (ref 78.0–100.0)
Monocytes Absolute: 0.6 10*3/uL (ref 0.1–1.0)
Monocytes Relative: 7 % (ref 3–12)
NEUTROS ABS: 5.3 10*3/uL (ref 1.7–7.7)
Neutrophils Relative %: 62 % (ref 43–77)
PLATELETS: 326 10*3/uL (ref 150–400)
RBC: 4.18 MIL/uL (ref 3.87–5.11)
RDW: 14.9 % (ref 11.5–15.5)
WBC: 8.4 10*3/uL (ref 4.0–10.5)

## 2014-08-18 LAB — BASIC METABOLIC PANEL
ANION GAP: 12 (ref 5–15)
BUN: 28 mg/dL — ABNORMAL HIGH (ref 6–20)
CHLORIDE: 104 mmol/L (ref 101–111)
CO2: 26 mmol/L (ref 22–32)
Calcium: 9.1 mg/dL (ref 8.9–10.3)
Creatinine, Ser: 2.02 mg/dL — ABNORMAL HIGH (ref 0.44–1.00)
GFR, EST AFRICAN AMERICAN: 28 mL/min — AB (ref >60)
GFR, EST NON AFRICAN AMERICAN: 25 mL/min — AB (ref >60)
Glucose, Bld: 102 mg/dL — ABNORMAL HIGH (ref 65–99)
Potassium: 4.2 mmol/L (ref 3.5–5.1)
SODIUM: 142 mmol/L (ref 135–145)

## 2014-08-18 LAB — BLOOD GAS, ARTERIAL
ACID-BASE DEFICIT: 1.3 mmol/L (ref 0.0–2.0)
Bicarbonate: 24 mEq/L (ref 20.0–24.0)
Drawn by: 22223
FIO2: 21 %
O2 Saturation: 93.9 %
Patient temperature: 37
TCO2: 22.6 mmol/L (ref 0–100)
pCO2 arterial: 49 mmHg — ABNORMAL HIGH (ref 35.0–45.0)
pH, Arterial: 7.312 — ABNORMAL LOW (ref 7.350–7.450)
pO2, Arterial: 77.6 mmHg — ABNORMAL LOW (ref 80.0–100.0)

## 2014-08-18 LAB — LACTIC ACID, PLASMA
LACTIC ACID, VENOUS: 1.8 mmol/L (ref 0.5–2.0)
Lactic Acid, Venous: 1.6 mmol/L (ref 0.5–2.0)

## 2014-08-18 LAB — RAPID URINE DRUG SCREEN, HOSP PERFORMED
Amphetamines: NOT DETECTED
BENZODIAZEPINES: POSITIVE — AB
Barbiturates: NOT DETECTED
Cocaine: NOT DETECTED
Opiates: POSITIVE — AB
Tetrahydrocannabinol: NOT DETECTED

## 2014-08-18 LAB — URINALYSIS, ROUTINE W REFLEX MICROSCOPIC
Bilirubin Urine: NEGATIVE
GLUCOSE, UA: NEGATIVE mg/dL
HGB URINE DIPSTICK: NEGATIVE
Ketones, ur: NEGATIVE mg/dL
LEUKOCYTES UA: NEGATIVE
NITRITE: NEGATIVE
Protein, ur: NEGATIVE mg/dL
Specific Gravity, Urine: >1.03 — ABNORMAL HIGH (ref 1.005–1.030)
UROBILINOGEN UA: 0.2 mg/dL (ref 0.0–1.0)
pH: 5.5 (ref 5.0–8.0)

## 2014-08-18 LAB — GLUCOSE, CAPILLARY: Glucose-Capillary: 75 mg/dL (ref 65–99)

## 2014-08-18 LAB — TROPONIN I

## 2014-08-18 LAB — TSH: TSH: 0.984 u[IU]/mL (ref 0.350–4.500)

## 2014-08-18 LAB — I-STAT CG4 LACTIC ACID, ED: LACTIC ACID, VENOUS: 2.52 mmol/L — AB (ref 0.5–2.0)

## 2014-08-18 MED ORDER — SODIUM CHLORIDE 0.9 % IV BOLUS (SEPSIS)
1000.0000 mL | Freq: Once | INTRAVENOUS | Status: AC
  Administered 2014-08-18: 1000 mL via INTRAVENOUS

## 2014-08-18 MED ORDER — SODIUM CHLORIDE 0.9 % IJ SOLN
3.0000 mL | Freq: Two times a day (BID) | INTRAMUSCULAR | Status: DC
  Administered 2014-08-19: 3 mL via INTRAVENOUS

## 2014-08-18 MED ORDER — ONDANSETRON HCL 4 MG/2ML IJ SOLN: 4.0000 mg | Freq: Four times a day (QID) | INTRAMUSCULAR | Status: DC | PRN

## 2014-08-18 MED ORDER — SODIUM CHLORIDE 0.9 % IV SOLN
INTRAVENOUS | Status: DC
  Administered 2014-08-19: 02:00:00 via INTRAVENOUS

## 2014-08-18 MED ORDER — INSULIN ASPART 100 UNIT/ML ~~LOC~~ SOLN: 0.0000 [IU] | Freq: Three times a day (TID) | SUBCUTANEOUS | Status: DC

## 2014-08-18 MED ORDER — ALUM & MAG HYDROXIDE-SIMETH 200-200-20 MG/5ML PO SUSP: 30.0000 mL | Freq: Four times a day (QID) | ORAL | Status: DC | PRN

## 2014-08-18 MED ORDER — ONDANSETRON HCL 4 MG PO TABS: 4.0000 mg | ORAL_TABLET | Freq: Four times a day (QID) | ORAL | Status: DC | PRN

## 2014-08-18 MED ORDER — SODIUM CHLORIDE 0.9 % IV SOLN
INTRAVENOUS | Status: DC
Start: 2014-08-18 — End: 2014-08-18
  Administered 2014-08-18: 21:00:00 via INTRAVENOUS

## 2014-08-18 NOTE — ED Notes (Addendum)
EMS reports pt had been lethargic at home since around 2pm today per pt's son.    Reports pt felt "normal" this morning.  Went out to have her hair done.    EMS reports upon their arrival, pt was able to talk and answer questions but enroute, bp decreased 90/69 and pt became less responsive.  CBG 111.  EMS put 20g iv in r forearm.  Pt presently drowsy but responds to voice and answers questions appropriately.

## 2014-08-18 NOTE — H&P (Signed)
PCP:   Elyn Peers, MD   Chief Complaint:  Confused and slow to respond  HPI: 70 yo female h/o benign meningioma resection, htn, dm, diastolic chf, osa on cpap, anxiety, chronic pain, depression brought in by family members for confusion that started earlier in the day.  Pt not eating or drinking most of the day.  Slow to respond.  Pt appears back to her baseline mental status now.  She denies any fevers.  No n/v/d.  No dysuria, no sob, cough, cp or abd pain.  No numb/tingling anywhere or focal weakness.  She has valium and xanax at home but says she has not taken any extra pills today.  She has no h/o seizures in the past.  Asked to evaluate pt for etiology of her confusion, aki and dehydration.  Review of Systems:  Positive and negative as per HPI otherwise all other systems are negative  Past Medical History: Past Medical History  Diagnosis Date  . Brain tumor     Surgery for benign brain tumor in the remote past  . Hypertension   . Preop cardiovascular exam     Cardiac clearance for knee surgery, January, 2013  . Edema   . Tobacco abuse   . Ejection fraction     EF 70%, echo, February, 2013  . Sleep apnea     uses Cpap  . Hepatitis 1970's    hep c  . Anxiety   . 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 Nasal congestion   . Diabetes mellitus without complication   . Insomnia   . 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 Spinal cord stimulator status     for pain   . Chronic knee pain   . Chronic back pain     with radiculopathy bilat legs  . Depression   . 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 Arthritis    Past Surgical History  Procedure Laterality Date  . Abdominal hysterectomy    . Knee surgery    . Nose surgery    . Back surgery      pt has stimulator in lower back and can't have an MRI  . Total knee arthroplasty  07/21/2011    Procedure: TOTAL KNEE ARTHROPLASTY;  Surgeon: Yvette Rack., MD;  Location: [facility];  Service:  Orthopedics;  Laterality: Left;  . 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 Cholecystectomy N/A 09/30/2012    Procedure: LAPAROSCOPIC CHOLECYSTECTOMY;  Surgeon: Donato Heinz, MD;  Location: [facility];  Service: General;  Laterality: N/A;    Medications: Prior to Admission medications   Medication Sig Start Date End Date Taking? Authorizing Provider  Aspirin-Salicylamide-Caffeine (BC HEADACHE) 325-95-16 MG TABS Take 1 packet by mouth daily as needed (for pain).    Historical Provider, MD  chlorproMAZINE (THORAZINE) 50 MG tablet Take 3 tablets (150 mg total) by mouth at bedtime. 01/31/13   Kathie Dike, MD  diazepam (VALIUM) 5 MG tablet Take 5 mg by mouth at bedtime as needed and may repeat dose one time if needed.     Historical Provider, MD  gabapentin (NEURONTIN) 300 MG capsule Take 2 capsules (600 mg total) by mouth 3 (three) times daily. 01/09/14   Donika Keith Rake, DO  hydrochlorothiazide (HYDRODIURIL) 25 MG tablet Take 1 tablet (25 mg total) by mouth daily. 12/05/13   Milton Ferguson, MD  HYDROcodone-acetaminophen (NORCO/VICODIN)  5-325 MG per tablet Take 1 tablet by mouth 2 (two) times a week.     Historical Provider, MD  Insulin Detemir (LEVEMIR FLEXTOUCH) 100 UNIT/ML Pen Inject 20 Units into the skin daily at 10 pm.    Historical Provider, MD  lisinopril (PRINIVIL,ZESTRIL) 20 MG tablet Take 20 mg by mouth daily.    Historical Provider, MD  metFORMIN (GLUCOPHAGE) 500 MG tablet Take 500 mg by mouth 2 (two) times daily.    Historical Provider, MD  Vilazodone HCl (VIIBRYD) 40 MG TABS Take 40 mg by mouth daily.    Historical Provider, MD    Allergies:  No Known Allergies  Social History:  reports that she quit smoking about 7 months ago. Her smoking use included Cigarettes. She has a 5 pack-year smoking history. She has never used smokeless tobacco. She reports that she does not drink alcohol or use illicit drugs.  Family History: Family History  Problem Relation Age of Onset  . Cancer  Mother   . Hypertension Mother   . Anesthesia problems Neg Hx   . Diabetes Mother   . Diabetes Brother     Physical Exam: Filed Vitals:   08/18/14 1819 08/18/14 1830 08/18/14 1900 08/18/14 1909  BP: 114/75 110/72 117/75 117/75  Pulse: 82 76  81  Temp:      Resp: 13 12 13 11   Height:      Weight:      SpO2: 97% 95%  96%   General appearance: alert, cooperative and no distress Head: Normocephalic, without obvious abnormality, atraumatic Eyes: negative Nose: Nares normal. Septum midline. Mucosa normal. No drainage or sinus tenderness. Neck: no JVD and supple, symmetrical, trachea midline Lungs: clear to auscultation bilaterally Heart: regular rate and rhythm, S1, S2 normal, no murmur, click, rub or gallop Abdomen: soft, non-tender; bowel sounds normal; no masses,  no organomegaly Extremities: extremities normal, atraumatic, no cyanosis or edema Pulses: 2+ and symmetric Skin: Skin color, texture, turgor normal. No rashes or lesions Neurologic: Grossly normal  Oriented x 3, mentation appears nml.  Cn 2-12 intact.  Strength intact.  Labs on Admission:   Recent Labs  08/18/14 1715  NA 142  K 4.2  CL 104  CO2 26  GLUCOSE 102*  BUN 28*  CREATININE 2.02*  CALCIUM 9.1    Recent Labs  08/18/14 1715  WBC 8.4  NEUTROABS 5.3  HGB 11.5*  HCT 37.0  MCV 88.5  PLT 326   12 lead ekg and cxr reviewed by myself Old records reviewed  Radiological Exams on Admission: Ct Head Wo Contrast  08/18/2014   CLINICAL DATA:  Altered mental status. Hypotension. No known injury.  EXAM: CT HEAD WITHOUT CONTRAST  TECHNIQUE: Contiguous axial images were obtained from the base of the skull through the vertex without intravenous contrast.  COMPARISON:  06/03/2013; 01/30/2013 ; 01/29/2013 brain MRI - 09/13/2009  FINDINGS: Grossly unchanged scattered periventricular hypodensities compatible with microvascular ischemic disease. The gray-white differentiation is otherwise well maintained without CT  evidence of acute large territory infarct. 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 Unchanged bulky dural calcifications. No intraparenchymal or extra-axial mass or hemorrhage. Normal size and configuration of ventricles and basilar cisterns. No midline shift. Polypoid mucosal thickening of the left maxillary sinus. The remaining paranasal sinuses and mastoid air cells are normally aerated. No air-fluid levels. Regional soft tissues appear normal. No displaced calvarial fracture.  IMPRESSION: 1. 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 2. Partially empty sella compatible with remote pituitary  lesion resection.   Electronically Signed   By: Sandi Mariscal M.D.   On: 08/18/2014 18:08   Dg Chest Portable 1 View  08/18/2014   CLINICAL DATA:  Altered mental status  EXAM: PORTABLE CHEST - 1 VIEW  COMPARISON:  08/23/2013  FINDINGS: Borderline cardiomegaly. No acute infiltrate or pleural effusion. No pulmonary edema. Mild basilar atelectasis. Spinal wires stimulator are noted mid thoracic spine.  IMPRESSION: No active disease.   Electronically Signed   By: Lahoma Crocker M.D.   On: 08/18/2014 17:42    Assessment/Plan  69 yo female h/o benign brain tumor resection presents with AMS, dehydration and aki  Principal Problem:   Acute encephalopathy-  No source of infection.  Dehydrated with mild lactic acidosis and aki.  Order Ivf.  Query Seizure with her h/o meningioma resection, order EEG in the am.  Place on seizure precautions and check neuro checks q 4 hours, hold her valium and opiates.  Could also be from medications.  Monitor closely for any neurological changes overnight.  Active Problems:   Depression- noted   Hypertension-  Soft bp, check orthostatics.  Hold cardiac meds   Brain tumor s/p resection-  Benign tumor, query seizures, had eeg in 2014 with similar presentation then which was normal   Diastolic heart failure- compensated at this time.  Given  2 liters ivf bolus in ED.  Just place on 75cc ns overnight   OSA on CPAP-  abg ordered by myself to r/o hypercapnea, which it does not   Diabetes mellitus type 2, uncontrolled-  Not in dka.  Place on ssi.  Hold metformin   AKI (acute kidney injury)-  Ivf, monitor uop, repeat cr in am   Dehydration-  As above   Lactic acidosis- as above, repeat level now after 2 liters ivf bolus  Observe on telemetry.  Pt mentation already resolved, no focal neuro deficits.   Possible d/c tomorrow if above w/u normal and renal function improves with resolution of lactic acidosis.  Evert Wenrich A 08/18/2014, 7:24 PM

## 2014-08-18 NOTE — ED Notes (Signed)
MD notified of lactic acid 2.52

## 2014-08-18 NOTE — ED Provider Notes (Addendum)
CSN: [id number]     Arrival date & time 08/18/14  1702 History   First MD Initiated Contact with Patient 08/18/14 1725     Chief Complaint  Patient presents with  . Altered Mental Status     (Consider location/radiation/quality/duration/timing/severity/associated sxs/prior Treatment) Patient is a 69 y.o. female presenting with altered mental status. The history is provided by the patient and a relative. The history is limited by the condition of the patient.  Altered Mental Status Presenting symptoms: confusion   Associated symptoms: no abdominal pain, no fever, no headaches, no rash and no vomiting   Patient's indicates she has been 'incoherent' today. Pt does not respond to many questions and is a difficult/limited historian - level 5 caveat.  Patients family indicates she had last seemed normal this morning, mid morning, when she was talking normally, acting normally.  At that time, patient had no physical c/o, and did not appear sick or ill.  Per report, pts husband noticed that since approximately 130 that pt seemed lethargic, slow to respond, and occasionally would not respond to questions. Ems was called. cbg 111. Pt denies any specific c/o currently. No headache. No cp. No sob. No abd pain. Denies cough or uri c/o. No fever or chills. No gu c/o. No unilateral numbness/weakness.     Past Medical History  Diagnosis Date  . Brain tumor     Surgery for benign brain tumor in the remote past  . Hypertension   . Preop cardiovascular exam     Cardiac clearance for knee surgery, January, 2013  . Edema   . Tobacco abuse   . Ejection fraction     EF 70%, echo, February, 2013  . Sleep apnea     uses Cpap  . Hepatitis 1970's    hep c  . Anxiety   . 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 Nasal congestion   . Diabetes mellitus without complication   . Insomnia   . 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 Spinal cord stimulator status     for  pain   . Chronic knee pain   . Chronic back pain     with radiculopathy bilat legs  . Depression   . 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 Arthritis    Past Surgical History  Procedure Laterality Date  . Abdominal hysterectomy    . Knee surgery    . Nose surgery    . Back surgery      pt has stimulator in lower back and can't have an MRI  . Total knee arthroplasty  07/21/2011    Procedure: TOTAL KNEE ARTHROPLASTY;  Surgeon: Yvette Rack., MD;  Location: [facility];  Service: Orthopedics;  Laterality: Left;  . 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 Cholecystectomy N/A 09/30/2012    Procedure: LAPAROSCOPIC CHOLECYSTECTOMY;  Surgeon: Donato Heinz, MD;  Location: [facility];  Service: General;  Laterality: N/A;   Family History  Problem Relation Age of Onset  . Cancer Mother   . Hypertension Mother   . Anesthesia problems Neg Hx   . Diabetes Mother   . Diabetes Brother    History  Substance Use Topics  . Smoking status: Former Smoker -- 0.50 packs/day for 10 years    Types: Cigarettes    Quit date: 12/26/2013  . Smokeless tobacco: Never Used  . Alcohol Use:  No     Comment: none since 03-28-2011   OB History    Gravida Para Term Preterm AB TAB SAB Ectopic Multiple Living   1 1 1       1      Review of Systems  Constitutional: Negative for fever and chills.  HENT: Negative for sore throat and trouble swallowing.   Eyes: Negative for visual disturbance.  Respiratory: Negative for shortness of breath.   Cardiovascular: Negative for chest pain.  Gastrointestinal: Negative for vomiting, abdominal pain and diarrhea.  Endocrine: Negative for polyuria.  Genitourinary: Negative for dysuria and flank pain.  Musculoskeletal: Negative for back pain and neck pain.  Skin: Negative for rash.  Neurological: Positive for speech difficulty. Negative for headaches.  Hematological: Does not bruise/bleed easily.  Psychiatric/Behavioral: Positive for confusion.      Allergies  Review of patient's  allergies indicates no known allergies.  Home Medications   Prior to Admission medications   Medication Sig Start Date End Date Taking? Authorizing Provider  Aspirin-Salicylamide-Caffeine (BC HEADACHE) 325-95-16 MG TABS Take 1 packet by mouth daily as needed (for pain).    Historical Provider, MD  chlorproMAZINE (THORAZINE) 50 MG tablet Take 3 tablets (150 mg total) by mouth at bedtime. 01/31/13   Kathie Dike, MD  diazepam (VALIUM) 5 MG tablet Take 5 mg by mouth at bedtime as needed and may repeat dose one time if needed.     Historical Provider, MD  gabapentin (NEURONTIN) 300 MG capsule Take 2 capsules (600 mg total) by mouth 3 (three) times daily. 01/09/14   Donika Keith Rake, DO  hydrochlorothiazide (HYDRODIURIL) 25 MG tablet Take 1 tablet (25 mg total) by mouth daily. 12/05/13   Milton Ferguson, MD  HYDROcodone-acetaminophen (NORCO/VICODIN) 5-325 MG per tablet Take 1 tablet by mouth 2 (two) times a week.     Historical Provider, MD  Insulin Detemir (LEVEMIR FLEXTOUCH) 100 UNIT/ML Pen Inject 20 Units into the skin daily at 10 pm.    Historical Provider, MD  lisinopril (PRINIVIL,ZESTRIL) 20 MG tablet Take 20 mg by mouth daily.    Historical Provider, MD  metFORMIN (GLUCOPHAGE) 500 MG tablet Take 500 mg by mouth 2 (two) times daily.    Historical Provider, MD  Vilazodone HCl (VIIBRYD) 40 MG TABS Take 40 mg by mouth daily.    Historical Provider, MD   BP 98/60 mmHg  Pulse 85  Temp(Src) 98.4 F (36.9 C)  Resp 12  Ht 5\' 1"  (1.549 m)  Wt 175 lb (79.379 kg)  BMI 33.08 kg/m2  SpO2 98% Physical Exam  Constitutional: She appears well-developed and well-nourished. No distress.  HENT:  Head: Atraumatic.  Mouth/Throat: Oropharynx is clear and moist.  Eyes: Conjunctivae are normal. Pupils are equal, round, and reactive to light. No scleral icterus.  Neck: Neck supple. No tracheal deviation present.  No bruits  Cardiovascular: Normal rate, regular rhythm, normal heart sounds and intact distal  pulses.  Exam reveals no gallop and no friction rub.   No murmur heard. Pulmonary/Chest: Effort normal and breath sounds normal. No respiratory distress.  Abdominal: Soft. Normal appearance and bowel sounds are normal. She exhibits no distension and no mass. There is no tenderness. There is no rebound and no guarding.  Genitourinary:  No cva tenderness  Musculoskeletal: She exhibits no edema or tenderness.  Neurological: She is alert. No cranial nerve deficit.  Pt awake and alert appearing. Slow to respond, does not respond to many questions. No pronator drift. Motor intact bil, stre 5/5. sens  grossly intact.   Skin: Skin is warm and dry. No rash noted. She is not diaphoretic.  Psychiatric:  Alert, slow to respond.   Nursing note and vitals reviewed.   ED Course  Procedures (including critical care time) Labs Review  Results for orders placed or performed during the hospital encounter of 08/18/14  CBC with Differential  Result Value Ref Range   WBC 8.4 4.0 - 10.5 K/uL   RBC 4.18 3.87 - 5.11 MIL/uL   Hemoglobin 11.5 (L) 12.0 - 15.0 g/dL   HCT 37.0 36.0 - 46.0 %   MCV 88.5 78.0 - 100.0 fL   MCH 27.5 26.0 - 34.0 pg   MCHC 31.1 30.0 - 36.0 g/dL   RDW 14.9 11.5 - 15.5 %   Platelets 326 150 - 400 K/uL   Neutrophils Relative % 62 43 - 77 %   Neutro Abs 5.3 1.7 - 7.7 K/uL   Lymphocytes Relative 24 12 - 46 %   Lymphs Abs 2.1 0.7 - 4.0 K/uL   Monocytes Relative 7 3 - 12 %   Monocytes Absolute 0.6 0.1 - 1.0 K/uL   Eosinophils Relative 6 (H) 0 - 5 %   Eosinophils Absolute 0.5 0.0 - 0.7 K/uL   Basophils Relative 1 0 - 1 %   Basophils Absolute 0.0 0.0 - 0.1 K/uL  Basic metabolic panel  Result Value Ref Range   Sodium 142 135 - 145 mmol/L   Potassium 4.2 3.5 - 5.1 mmol/L   Chloride 104 101 - 111 mmol/L   CO2 26 22 - 32 mmol/L   Glucose, Bld 102 (H) 65 - 99 mg/dL   BUN 28 (H) 6 - 20 mg/dL   Creatinine, Ser 2.02 (H) 0.44 - 1.00 mg/dL   Calcium 9.1 8.9 - 10.3 mg/dL   GFR calc non Af  Amer 25 (L) >60 mL/min   GFR calc Af Amer 28 (L) >60 mL/min   Anion gap 12 5 - 15  Urinalysis, Routine w reflex microscopic  Result Value Ref Range   Color, Urine YELLOW YELLOW   APPearance CLEAR CLEAR   Specific Gravity, Urine >1.030 (H) 1.005 - 1.030   pH 5.5 5.0 - 8.0   Glucose, UA NEGATIVE NEGATIVE mg/dL   Hgb urine dipstick NEGATIVE NEGATIVE   Bilirubin Urine NEGATIVE NEGATIVE   Ketones, ur NEGATIVE NEGATIVE mg/dL   Protein, ur NEGATIVE NEGATIVE mg/dL   Urobilinogen, UA 0.2 0.0 - 1.0 mg/dL   Nitrite NEGATIVE NEGATIVE   Leukocytes, UA NEGATIVE NEGATIVE  I-Stat CG4 Lactic Acid, ED  Result Value Ref Range   Lactic Acid, Venous 2.52 (HH) 0.5 - 2.0 mmol/L   Comment NOTIFIED PHYSICIAN    Ct Head Wo Contrast  08/18/2014   CLINICAL DATA:  Altered mental status. Hypotension. No known injury.  EXAM: CT HEAD WITHOUT CONTRAST  TECHNIQUE: Contiguous axial images were obtained from the base of the skull through the vertex without intravenous contrast.  COMPARISON:  06/03/2013; 01/30/2013 ; 01/29/2013 brain MRI - 09/13/2009  FINDINGS: Grossly unchanged scattered periventricular hypodensities compatible with microvascular ischemic disease. The gray-white differentiation is otherwise well maintained without CT evidence of acute large territory infarct. 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 Unchanged bulky dural calcifications. No intraparenchymal or extra-axial mass or hemorrhage. Normal size and configuration of ventricles and basilar cisterns. No midline shift. Polypoid mucosal thickening of the left maxillary sinus. The remaining paranasal sinuses and mastoid air cells are normally aerated. No air-fluid levels. Regional soft tissues appear  normal. No displaced calvarial fracture.  IMPRESSION: 1. 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 2. 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   Electronically Signed   By:  Sandi Mariscal M.D.   On: 08/18/2014 18:08   Dg Chest Portable 1 View  08/18/2014   CLINICAL DATA:  Altered mental status  EXAM: PORTABLE CHEST - 1 VIEW  COMPARISON:  08/23/2013  FINDINGS: Borderline cardiomegaly. No acute infiltrate or pleural effusion. No pulmonary edema. Mild basilar atelectasis. Spinal wires stimulator are noted mid thoracic spine.  IMPRESSION: No active disease.   Electronically Signed   By: Lahoma Crocker M.D.   On: 08/18/2014 17:42       EKG Interpretation   Date/Time:  Tuesday Aug 18 2014 17:10:32 EDT Ventricular Rate:  80 PR Interval:  128 QRS Duration: 72 QT Interval:  390 QTC Calculation: 449 R Axis:   12 Text Interpretation:  Normal sinus rhythm Normal ECG No significant change  since last tracing Confirmed by Ashok Cordia  MD, Lennette Bihari ([id number]) on 08/18/2014  5:26:11 PM      MDM   Iv ns. Labs. Ct.   Reviewed nursing notes and prior charts for additional history.   Lactate level elevated.  ?dehydration ?recent poor po intake. Creatinine elevated above baseline.  Iv ns boluses.  Recheck abd soft nt. Afeb. No sirs or source infection. abd soft nt. ua neg. cxr neg.  No headache, no neck pain/stiffness.   Reviewed prior charts - labs/presentation appears very similar to visit 4/13, and a visit in 2014.   Pt is noted to be on several meds with sedating side effects. pts mental status, incl speed of responding verbally, does seem to fluctuate during course visit and on recheck verbal responsiveness/speech appears unremarkable.  No seizure activity noted.   Given aki, dehydration, altered ms, will admit.       Lajean Saver, MD 08/18/14 [phone]

## 2014-08-19 ENCOUNTER — Observation Stay (HOSPITAL_COMMUNITY)
Admit: 2014-08-19 | Discharge: 2014-08-19 | Disposition: A | Discharge status: Home / Self Care | Payer: Medicare Other | Attending: Family Medicine | Admitting: Family Medicine

## 2014-08-19 DIAGNOSIS — N179 Acute kidney failure, unspecified: Secondary | ICD-10-CM

## 2014-08-19 DIAGNOSIS — G9340 Encephalopathy, unspecified: Secondary | ICD-10-CM

## 2014-08-19 DIAGNOSIS — E1165 Type 2 diabetes mellitus with hyperglycemia: Secondary | ICD-10-CM

## 2014-08-19 DIAGNOSIS — E860 Dehydration: Secondary | ICD-10-CM | POA: Diagnosis not present

## 2014-08-19 DIAGNOSIS — E872 Acidosis: Secondary | ICD-10-CM

## 2014-08-19 LAB — TROPONIN I: Troponin I: <0.03 ng/mL (ref <0.031)

## 2014-08-19 LAB — GLUCOSE, CAPILLARY
Glucose-Capillary: 97 mg/dL (ref 65–99)
Glucose-Capillary: 94 mg/dL (ref 65–99)

## 2014-08-19 LAB — CBC
HCT: 36.5 % (ref 36.0–46.0)
Hemoglobin: 11.1 g/dL — ABNORMAL LOW (ref 12.0–15.0)
MCH: 27.1 pg (ref 26.0–34.0)
MCHC: 30.4 g/dL (ref 30.0–36.0)
MCV: 89 fL (ref 78.0–100.0)
Platelets: 296 10*3/uL (ref 150–400)
RBC: 4.1 MIL/uL (ref 3.87–5.11)
RDW: 14.9 % (ref 11.5–15.5)
WBC: 8.1 10*3/uL (ref 4.0–10.5)

## 2014-08-19 LAB — BASIC METABOLIC PANEL
ANION GAP: 5 (ref 5–15)
BUN: 21 mg/dL — ABNORMAL HIGH (ref 6–20)
CALCIUM: 8.5 mg/dL — AB (ref 8.9–10.3)
CHLORIDE: 112 mmol/L — AB (ref 101–111)
CO2: 26 mmol/L (ref 22–32)
CREATININE: 1.28 mg/dL — AB (ref 0.44–1.00)
GFR calc Af Amer: 49 mL/min — ABNORMAL LOW (ref >60)
GFR, EST NON AFRICAN AMERICAN: 43 mL/min — AB (ref >60)
Glucose, Bld: 104 mg/dL — ABNORMAL HIGH (ref 65–99)
Potassium: 3.6 mmol/L (ref 3.5–5.1)
Sodium: 143 mmol/L (ref 135–145)

## 2014-08-19 LAB — VITAMIN B12: VITAMIN B 12: 1427 pg/mL — AB (ref 180–914)

## 2014-08-19 MED ORDER — ACETAMINOPHEN 325 MG PO TABS
650.0000 mg | ORAL_TABLET | Freq: Four times a day (QID) | ORAL | Status: DC | PRN
  Administered 2014-08-19: 650 mg via ORAL
  Filled 2014-08-19: qty 2

## 2014-08-19 NOTE — Discharge Summary (Signed)
Physician Discharge Summary  Rebecca Richards WEX:[id number] DOB: June 01, 1945 DOA: 08/18/2014  PCP: Elyn Peers, MD  Admit date: 08/18/2014 Discharge date: 08/19/2014  Time spent: Greater than 30 minutes  Recommendations for Outpatient Follow-up:  1. Recommend further evaluation of patient's psychotropic medications. 2. Recommend rechecking the patient's renal function in 1 week. 3. Recommend follow-up of the patient's vitamin B12 level and hemoglobin A1c pending at the time of discharge. 4. The patient was instructed not to drive for 1 week.  Discharge Diagnoses:  1. Acute encephalopathy. 2. Volume depletion/dehydration. 3. Acute kidney injury, secondary to prerenal azotemia in the setting of ARB/HCTZ. 4. Lactic acidosis, secondary to dehydration. 5. Essential hypertension. 6. Chronic low back pain with multiple surgeries including a spinal cord stimulator for pain. 7. History of resection of a benign meningioma. 8. Obstructive sleep apnea, on CPAP. 9. Chronic depression and anxiety. 10. Type 2 diabetes mellitus.  Discharge Condition: Improved.  Diet recommendation: Carbohydrate modified/heart healthy.  Filed Weights   08/18/14 1706 08/19/14 0000  Weight: 79.379 kg (175 lb) 80.287 kg (177 lb)    History of present illness:  The patient is a 69 year old woman with a history of a resected benign meningioma, chronic pain syndrome with chronic low back pain/radiculopathy/and knee pain; status post spinal cord stimulator, depression with anxiety, hypertension, and obstructive sleep apnea. She presented to the emergency department on 08/18/14 with a report of confusion and slow to respond by her family members. It was also reported that she may have had a remote seizure in the past. In the ED, she was afebrile and hemodynamically stable, though her initial blood pressure was on the low-normal side of 98/60. Her lab data were significant for urine drug screen positive for benzodiazepine  and opiates; urinalysis without WBCs or bacteria, but with greater than 1.030 specific gravity; lactic acid of 252; BUN of 28; creatinine of 2.02; ABG with a pH of 7.3, PCO2 of 49, and PO2 of 78. CT of the head revealed microvascular ischemic disease without acute intracranial process and a partially empty sella compatible with remote patellar Terry lesion resection. Chest x-ray revealed no active disease. She was admitted for further evaluation and management.  Hospital Course:  The patient was started on IV fluids for treatment of dehydration and acute kidney injury. Some of her chronic medications were continued, but some were withheld such as Xanax and hydrocodone due to confusion; metformin due to lactic acidosis; and Micardis/HCTZ secondary to dehydration/acute renal insufficiency. For further evaluation, a number of studies were ordered. Her troponin I was negative 3. Her follow-up lactic acid level normalized. Her follow-up creatinine improved to 1.28. Her TSH was within normal limits at 0.98. EEG revealed normal recording of awake and drowsy states. Vitamin B12 level was pending at the time of discharge. Ordering a MRI of her brain was contemplated, but the patient stated that she could not have an MRI due to the spinal cord stimulator.  The patient improved clinically and symptomatically. She had no evidence of confusion at the time of discharge. Her son confirmed that she was back to baseline. The etiology of her presentation was unclear, but may been exacerbated by dehydration/volume depletion. Following my questioning of her medications, it was apparent that she did not want any of her medications changed as she stated that she had been on most of not all of them for 6 months or longer. She also denied taking more of her Xanax and hydrocodone than prescribed. Therefore, she was discharged on her  home medications without any changes. However I would recommend a periodic review of her  psychotropic medications for pain, anxiety, and depression to see if any can be titrated down in the outpatient setting.  The patient apparently has an outpatient follow-up appointment with a neurologist in [facility] tomorrow and with her PCP on Friday. She was encouraged to keep these appointments.    Procedures:  EEG: Normal study.  Consultations:  None  Discharge Exam: Filed Vitals:   08/19/14 1350  BP: 143/86  Pulse: 71  Temp: 98.2 F (36.8 C)  Resp: 16    General: 69 year old African-American woman in no acute distress. Cardiovascular: S1, S2, no murmurs rubs or gallops. Respiratory: Clear to auscultation bilaterally. Neurologic: She is alert and oriented 2. Cranial nerves II through XII are intact. Her gait was purposeful, but no evidence of ataxia or foot drag. Her speech was clear. She follows directions well.  Discharge Instructions   Discharge Instructions    Diet - low sodium heart healthy    Complete by:  As directed      Discharge instructions    Complete by:  As directed   Drink at least 6-8 cups of fluids daily; avoid dehydration.     Driving Restrictions    Complete by:  As directed   Do not drive for 1 week.     Increase activity slowly    Complete by:  As directed           Current Discharge Medication List    CONTINUE these medications which have NOT CHANGED   Details  ALPRAZolam (XANAX) 1 MG tablet Take 1 mg by mouth at bedtime as needed for anxiety or sleep.     B-Complex TABS Take 1 tablet by mouth daily.    buPROPion (WELLBUTRIN SR) 150 MG 12 hr tablet Take 150 mg by mouth 2 (two) times daily.    chlorproMAZINE (THORAZINE) 50 MG tablet Take 3 tablets (150 mg total) by mouth at bedtime. Qty: 21 tablet, Refills: 0    Cholecalciferol (VITAMIN D) 2000 UNITS CAPS Take 1 capsule by mouth daily.    gabapentin (NEURONTIN) 300 MG capsule Take 2 capsules (600 mg total) by mouth 3 (three) times daily. Qty: 180 capsule, Refills: 5    Green  Tea, Camillia sinensis, (GREEN TEA PO) Take 1 capsule by mouth daily.    HYDROcodone-acetaminophen (NORCO) 10-325 MG per tablet Take 1 tablet by mouth daily as needed for moderate pain or severe pain.    Icosapent Ethyl 1 G CAPS Take 2 capsules by mouth daily.    Insulin Detemir (LEVEMIR FLEXTOUCH) 100 UNIT/ML Pen Inject 20 Units into the skin daily at 10 pm.    metFORMIN (GLUCOPHAGE) 500 MG tablet Take 500 mg by mouth 2 (two) times daily.    naproxen sodium (ANAPROX) 220 MG tablet Take 220-440 mg by mouth daily as needed (for pain).    Omega-3 Fatty Acids (FISH OIL) 1000 MG CAPS Take 1 capsule by mouth daily.    oxymetazoline (AFRIN) 0.05 % nasal spray Place 1 spray into both nostrils 2 (two) times daily as needed for congestion.    telmisartan-hydrochlorothiazide (MICARDIS HCT) 80-12.5 MG per tablet Take 1 tablet by mouth daily.    Vilazodone HCl (VIIBRYD) 40 MG TABS Take 40 mg by mouth daily.    vitamin B-12 (CYANOCOBALAMIN) 500 MCG tablet Take 500 mcg by mouth daily.      STOP taking these medications     Aspirin-Salicylamide-Caffeine (BC HEADACHE) 325-95-16 MG TABS  hydrochlorothiazide (HYDRODIURIL) 25 MG tablet        No Known Allergies Follow-up Information    Follow up with Elyn Peers, MD.   Specialty:  Family Medicine   Why:  Follow up as scheduled.   Contact information:   [address] [place] [address] [phone]        The results of significant diagnostics from this hospitalization (including imaging, microbiology, ancillary and laboratory) are listed below for reference.    Significant Diagnostic Studies: Ct Head Wo Contrast  08/18/2014   CLINICAL DATA:  Altered mental status. Hypotension. No known injury.  EXAM: CT HEAD WITHOUT CONTRAST  TECHNIQUE: Contiguous axial images were obtained from the base of the skull through the vertex without intravenous contrast.  COMPARISON:  06/03/2013; 01/30/2013 ; 01/29/2013 brain MRI - 09/13/2009   FINDINGS: Grossly unchanged scattered periventricular hypodensities compatible with microvascular ischemic disease. The gray-white differentiation is otherwise well maintained without CT evidence of acute large territory infarct. 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 Unchanged bulky dural calcifications. No intraparenchymal or extra-axial mass or hemorrhage. Normal size and configuration of ventricles and basilar cisterns. No midline shift. Polypoid mucosal thickening of the left maxillary sinus. The remaining paranasal sinuses and mastoid air cells are normally aerated. No air-fluid levels. Regional soft tissues appear normal. No displaced calvarial fracture.  IMPRESSION: 1. 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 2. 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   Electronically Signed   By: Sandi Mariscal M.D.   On: 08/18/2014 18:08   Dg Chest Portable 1 View  08/18/2014   CLINICAL DATA:  Altered mental status  EXAM: PORTABLE CHEST - 1 VIEW  COMPARISON:  08/23/2013  FINDINGS: Borderline cardiomegaly. No acute infiltrate or pleural effusion. No pulmonary edema. Mild basilar atelectasis. Spinal wires stimulator are noted mid thoracic spine.  IMPRESSION: No active disease.   Electronically Signed   By: Lahoma Crocker M.D.   On: 08/18/2014 17:42    Microbiology: No results found for this or any previous visit (from the past 240 hour(s)).   Labs: Basic Metabolic Panel:  Recent Labs Lab 08/18/14 1715 08/19/14 0347  NA 142 143  K 4.2 3.6  CL 104 112*  CO2 26 26  GLUCOSE 102* 104*  BUN 28* 21*  CREATININE 2.02* 1.28*  CALCIUM 9.1 8.5*   Liver Function Tests: No results for input(s): AST, ALT, ALKPHOS, BILITOT, PROT, ALBUMIN in the last 168 hours. No results for input(s): LIPASE, AMYLASE in the last 168 hours. No results for input(s): AMMONIA in the last 168 hours. CBC:  Recent Labs Lab 08/18/14 1715  08/19/14 0347  WBC 8.4 8.1  NEUTROABS 5.3  --   HGB 11.5* 11.1*  HCT 37.0 36.5  MCV 88.5 89.0  PLT 326 296   Cardiac Enzymes:  Recent Labs Lab 08/18/14 2031 08/19/14 0347 08/19/14 0943  TROPONINI <0.03 <0.03 <0.03   BNP: BNP (last 3 results) No results for input(s): BNP in the last 8760 hours.  ProBNP (last 3 results) No results for input(s): PROBNP in the last 8760 hours.  CBG:  Recent Labs Lab 08/18/14 2144 08/19/14 0731 08/19/14 1200  GLUCAP 75 97 94       Signed:  Elizabeht Suto  [facility] 08/19/2014, 2:30 PM

## 2014-08-19 NOTE — Progress Notes (Signed)
Patient states understanding of discharge instructions.  

## 2014-08-19 NOTE — Care Management Note (Signed)
Case Management Note  Patient Details  Name: Rebecca Richards MRN: [id number] Date of Birth: June 25, 1945  Subjective/Objective:                  Pt admitted from home with encephalopathy. Pt lives with her son and will return home at discharge. Pt is independent with ADL's.  Action/Plan: Anticipate discharge today. No CM needs noted.  Expected Discharge Date:                  Expected Discharge Plan:  Home/Self Care  In-House Referral:  NA  Discharge planning Services  CM Consult  Post Acute Care Choice:  NA Choice offered to:  NA  DME Arranged:    DME Agency:     HH Arranged:    HH Agency:     Status of Service:  Completed, signed off  Medicare Important Message Given:    Date Medicare IM Given:    Medicare IM give by:    Date Additional Medicare IM Given:    Additional Medicare Important Message give by:     If discussed at [facility] Meetings, dates discussed:    Additional Comments:  Joylene Draft, RN 08/19/2014, 2:14 PM

## 2014-08-19 NOTE — Progress Notes (Signed)
EEG completed; results pending.    

## 2014-08-19 NOTE — Procedures (Signed)
  [place] A. Merlene Laughter, MD     [email or web address]           HISTORY: The patient is a 69 year old female who presents with spells suspicious for seizure activity.  MEDICATIONS: Scheduled Meds: Continuous Infusions: PRN Meds:.  Prior to Admission medications   Medication Sig Start Date End Date Taking? Authorizing Provider  ALPRAZolam Duanne Moron) 1 MG tablet Take 1 mg by mouth at bedtime as needed for anxiety or sleep.  08/07/14   Historical Provider, MD  B-Complex TABS Take 1 tablet by mouth daily.    Historical Provider, MD  buPROPion (WELLBUTRIN SR) 150 MG 12 hr tablet Take 150 mg by mouth 2 (two) times daily. 07/20/14   Historical Provider, MD  chlorproMAZINE (THORAZINE) 50 MG tablet Take 3 tablets (150 mg total) by mouth at bedtime. 01/31/13   Kathie Dike, MD  Cholecalciferol (VITAMIN D) 2000 UNITS CAPS Take 1 capsule by mouth daily.    Historical Provider, MD  gabapentin (NEURONTIN) 300 MG capsule Take 2 capsules (600 mg total) by mouth 3 (three) times daily. 01/09/14   Donika Keith Rake, DO  Green Tea, Camillia sinensis, (GREEN TEA PO) Take 1 capsule by mouth daily.    Historical Provider, MD  HYDROcodone-acetaminophen (NORCO) 10-325 MG per tablet Take 1 tablet by mouth daily as needed for moderate pain or severe pain.    Historical Provider, MD  Icosapent Ethyl 1 G CAPS Take 2 capsules by mouth daily.    Historical Provider, MD  Insulin Detemir (LEVEMIR FLEXTOUCH) 100 UNIT/ML Pen Inject 20 Units into the skin daily at 10 pm.    Historical Provider, MD  metFORMIN (GLUCOPHAGE) 500 MG tablet Take 500 mg by mouth 2 (two) times daily.    Historical Provider, MD  naproxen sodium (ANAPROX) 220 MG tablet Take 220-440 mg by mouth daily as needed (for pain).    Historical Provider, MD  Omega-3 Fatty Acids (FISH OIL) 1000 MG CAPS Take 1 capsule by mouth daily.    Historical Provider, MD  oxymetazoline (AFRIN) 0.05 % nasal spray Place 1 spray into both nostrils 2 (two) times  daily as needed for congestion.    Historical Provider, MD  telmisartan-hydrochlorothiazide (MICARDIS HCT) 80-12.5 MG per tablet Take 1 tablet by mouth daily.    Historical Provider, MD  Vilazodone HCl (VIIBRYD) 40 MG TABS Take 40 mg by mouth daily.    Historical Provider, MD  vitamin B-12 (CYANOCOBALAMIN) 500 MCG tablet Take 500 mcg by mouth daily.    Historical Provider, MD      ANALYSIS: A 16 channel recording using standard 10 20 measurements is conducted for 20 minutes. There is a well-formed posterior dominant rhythm of 9-1/2-10 Hz which attenuates with eye opening. There is beta activity observed in the frontal areas. Awake and drowsy activities are observed. Photic simulation is carried out without abnormal changes in the background activity. There is no focal or lateralized slowing. There is no epileptiform activities observed.   IMPRESSION: This a normal recording of the awake and drowsy states.      Jasie Meleski A. Merlene Laughter, M.D.  Diplomate, Tax adviser of Psychiatry and Neurology ( Neurology).

## 2014-08-20 ENCOUNTER — Encounter (INDEPENDENT_AMBULATORY_CARE_PROVIDER_SITE_OTHER): Payer: Self-pay | Admitting: Diagnostic Neuroimaging

## 2014-08-20 ENCOUNTER — Ambulatory Visit (INDEPENDENT_AMBULATORY_CARE_PROVIDER_SITE_OTHER): Payer: Medicare Other | Admitting: Diagnostic Neuroimaging

## 2014-08-20 DIAGNOSIS — R29898 Other symptoms and signs involving the musculoskeletal system: Secondary | ICD-10-CM

## 2014-08-20 DIAGNOSIS — Z0289 Encounter for other administrative examinations: Secondary | ICD-10-CM

## 2014-08-20 LAB — HEMOGLOBIN A1C
Hgb A1c MFr Bld: 7.2 % — ABNORMAL HIGH (ref 4.8–5.6)
Mean Plasma Glucose: 160 mg/dL

## 2014-08-20 NOTE — Procedures (Signed)
   [facility]  NCS (NERVE CONDUCTION STUDY) WITH EMG (ELECTROMYOGRAPHY) REPORT   STUDY DATE: 08/20/14 PATIENT NAME: Rebecca Richards DOB: 10-Mar-1946 MRN: [id number]  ORDERING CLINICIAN: Lucianne Lei, MD  TECHNOLOGIST: Laretta Alstrom  ELECTROMYOGRAPHER: Earlean Polka. Maribelle Hopple, MD  CLINICAL INFORMATION: 69 year old female with diabetes and multiple lumbar spine surgeries, here for evaluation of lower extremity numbness and weakness. Exam notable for reduced reflexes and pinprick sensation in the lower extremities.   FINDINGS: NERVE CONDUCTION STUDY: Bilateral peroneal and tibial motor responses and F wave latencies are normal. Bilateral H reflex responses are normal. Bilateral superficial peroneal sensory responses are normal.   NEEDLE ELECTROMYOGRAPHY: Needle examination of right lower extremity demonstrates: Right gluteus medius, vastus medialis, tibialis posterior, gastrocnemius: No abnormal spontaneous activity at rest and normal motor unit recruitment on exertion. Right tibialis anterior: 1+ positive sharp waves at rest and decreased motor unit recruitment on exertion. Right L4-5 paraspinal muscles: 2+ positive sharp waves and fibrillation potentials.   IMPRESSION: This is an abnormal study demonstrate: 1. Electrodiagnostic evidence of right lumbar radiculopathy (L5). 2. No definite evidence of underlying large fiber neuropathy. However given the clinical context, a small fiber neuropathy is not totally excluded.     INTERPRETING PHYSICIAN:  Penni Bombard, MD Certified in Neurology, Neurophysiology and Neuroimaging  [facility] [address], [place], [place] [address] [phone]

## 2015-03-29 ENCOUNTER — Encounter (HOSPITAL_COMMUNITY): Payer: Self-pay | Admitting: *Deleted

## 2015-03-29 ENCOUNTER — Emergency Department (HOSPITAL_COMMUNITY)
Admission: EM | Admit: 2015-03-29 | Discharge: 2015-03-29 | Disposition: A | Discharge status: Home / Self Care | Payer: Medicare HMO | Attending: Emergency Medicine | Admitting: Emergency Medicine

## 2015-03-29 DIAGNOSIS — E119 Type 2 diabetes mellitus without complications: Secondary | ICD-10-CM | POA: Insufficient documentation

## 2015-03-29 DIAGNOSIS — G8929 Other chronic pain: Secondary | ICD-10-CM | POA: Insufficient documentation

## 2015-03-29 DIAGNOSIS — Z85528 Personal history of other malignant neoplasm of kidney: Secondary | ICD-10-CM | POA: Diagnosis not present

## 2015-03-29 DIAGNOSIS — Z9981 Dependence on supplemental oxygen: Secondary | ICD-10-CM | POA: Diagnosis not present

## 2015-03-29 DIAGNOSIS — Z7951 Long term (current) use of inhaled steroids: Secondary | ICD-10-CM | POA: Diagnosis not present

## 2015-03-29 DIAGNOSIS — M1990 Unspecified osteoarthritis, unspecified site: Secondary | ICD-10-CM | POA: Insufficient documentation

## 2015-03-29 DIAGNOSIS — F329 Major depressive disorder, single episode, unspecified: Secondary | ICD-10-CM | POA: Insufficient documentation

## 2015-03-29 DIAGNOSIS — Z79899 Other long term (current) drug therapy: Secondary | ICD-10-CM | POA: Diagnosis not present

## 2015-03-29 DIAGNOSIS — Z87891 Personal history of nicotine dependence: Secondary | ICD-10-CM | POA: Insufficient documentation

## 2015-03-29 DIAGNOSIS — L299 Pruritus, unspecified: Secondary | ICD-10-CM

## 2015-03-29 DIAGNOSIS — Z7984 Long term (current) use of oral hypoglycemic drugs: Secondary | ICD-10-CM | POA: Diagnosis not present

## 2015-03-29 DIAGNOSIS — Z8619 Personal history of other infectious and parasitic diseases: Secondary | ICD-10-CM | POA: Insufficient documentation

## 2015-03-29 DIAGNOSIS — G4700 Insomnia, unspecified: Secondary | ICD-10-CM | POA: Insufficient documentation

## 2015-03-29 DIAGNOSIS — G4730 Sleep apnea, unspecified: Secondary | ICD-10-CM | POA: Diagnosis not present

## 2015-03-29 DIAGNOSIS — Z8719 Personal history of other diseases of the digestive system: Secondary | ICD-10-CM | POA: Insufficient documentation

## 2015-03-29 DIAGNOSIS — I10 Essential (primary) hypertension: Secondary | ICD-10-CM | POA: Insufficient documentation

## 2015-03-29 DIAGNOSIS — Z86011 Personal history of benign neoplasm of the brain: Secondary | ICD-10-CM | POA: Diagnosis not present

## 2015-03-29 DIAGNOSIS — F419 Anxiety disorder, unspecified: Secondary | ICD-10-CM | POA: Diagnosis not present

## 2015-03-29 DIAGNOSIS — Z87448 Personal history of other diseases of urinary system: Secondary | ICD-10-CM | POA: Diagnosis not present

## 2015-03-29 DIAGNOSIS — Z794 Long term (current) use of insulin: Secondary | ICD-10-CM | POA: Insufficient documentation

## 2015-03-29 DIAGNOSIS — L439 Lichen planus, unspecified: Secondary | ICD-10-CM

## 2015-03-29 DIAGNOSIS — L298 Other pruritus: Secondary | ICD-10-CM | POA: Diagnosis present

## 2015-03-29 MED ORDER — METHYLPREDNISOLONE ACETATE 80 MG/ML IJ SUSP
80.0000 mg | Freq: Once | INTRAMUSCULAR | Status: AC
  Administered 2015-03-29: 80 mg via INTRAMUSCULAR
  Filled 2015-03-29: qty 1

## 2015-03-29 MED ORDER — HYDROXYZINE HCL 25 MG PO TABS: 25.0000 mg | ORAL_TABLET | Freq: Four times a day (QID) | ORAL | Status: DC

## 2015-03-29 MED ORDER — HYDROXYZINE HCL 25 MG PO TABS
25.0000 mg | ORAL_TABLET | Freq: Once | ORAL | Status: AC
  Administered 2015-03-29: 25 mg via ORAL
  Filled 2015-03-29: qty 1

## 2015-03-29 NOTE — ED Notes (Signed)
Pt reports itching x 4 days. 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

## 2015-03-29 NOTE — Discharge Instructions (Signed)
Lichen Planus Lichen planus is a skin problem. It causes redness, itching, small bumps, and sores. Areas of the body that are often affected include:  Arms, wrists, legs, or ankles.  Chest, back, or belly (abdomen).  Genital areas such as the vulva and vagina.  Gums and inside of the mouth.  Scalp.  Fingernails or toenails. Treatment can help to control symptoms. This condition is not passed from one person to another (not contagious). It can last for a long time. HOME CARE  Take over-the-counter and prescription medicines only as told by your doctor.   Use creams or ointments as told by your doctor.   Do not scratch the affected areas of skin.   Women should keep the vagina as clean and dry as they can. GET HELP IF:  Your redness, swelling, or pain gets worse.  You have fluid, blood, or pus coming from the affected area.  Your eyes become irritated.   This information is not intended to replace advice given to you by your health care provider. Make sure you discuss any questions you have with your health care provider.   Document Released: 02/24/2008 Document Revised: 12/02/2014 Document Reviewed: 06/08/2014 Elsevier Interactive Patient Education [employer].

## 2015-04-02 NOTE — ED Provider Notes (Signed)
CSN: [id number]     Arrival date & time 03/29/15  1701 History   First MD Initiated Contact with Patient 03/29/15 2015     Chief Complaint  Patient presents with  . Pruritis     (Consider location/radiation/quality/duration/timing/severity/associated sxs/prior Treatment) HPI   Rebecca Richards is a 70 y.o. female who presents to the Emergency Department complaining of  Severe itching for 4 days.  She reports hx of lichen planus and has occasional flares.  She states her symptoms usually improve with steroids.  She states she has been applying lotion without relief.  She denies pain, fever, swelling, hx of liver disorders.     Past Medical History  Diagnosis Date  . Brain tumor [facility])     Surgery for benign brain tumor in the remote past  . Hypertension   . Preop cardiovascular exam     Cardiac clearance for knee surgery, January, 2013  . Edema   . Tobacco abuse   . Ejection fraction     EF 70%, echo, February, 2013  . Sleep apnea     uses Cpap  . Hepatitis 1970's    hep c  . Anxiety   . 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 Nasal congestion   . Diabetes mellitus without complication ([place])   . Insomnia   . Spinal cord stimulator status     for pain   . Chronic knee pain   . Chronic back pain     with radiculopathy bilat legs  . Depression   . Arthritis   . 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 Abdominal hysterectomy    . Knee surgery    . Nose surgery    . Back surgery      pt has stimulator in lower back and can't have an MRI  . Total knee arthroplasty  07/21/2011    Procedure: TOTAL KNEE ARTHROPLASTY;  Surgeon: Yvette Rack., MD;  Location: [place];  Service: Orthopedics;  Laterality: Left;  . 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 Cholecystectomy N/A 09/30/2012    Procedure: LAPAROSCOPIC CHOLECYSTECTOMY;  Surgeon: Donato Heinz,  MD;  Location: [facility];  Service: General;  Laterality: N/A;   Family History  Problem Relation Age of Onset  . Cancer Mother   . Hypertension Mother   . Anesthesia problems Neg Hx   . Diabetes Mother   . Diabetes Brother    Social History  Substance Use Topics  . Smoking status: Former Smoker -- 0.50 packs/day for 10 years    Types: Cigarettes    Quit date: 12/26/2013  . Smokeless tobacco: Never Used  . Alcohol Use: No     Comment: none since 03-28-2011   OB History    Gravida Para Term Preterm AB TAB SAB Ectopic Multiple Living   1 1 1       1      Review of Systems  Constitutional: Negative for fever, chills, activity change and appetite change.  HENT: Negative for facial swelling, sore throat and trouble swallowing.   Respiratory: Negative for chest tightness and shortness of breath.   Gastrointestinal: Negative for nausea, vomiting and abdominal pain.  Genitourinary: Negative for dysuria.  Musculoskeletal: Negative for neck pain and neck stiffness.  Skin: Positive for rash.  Negative for wound.  Neurological: Negative for dizziness, weakness, numbness and headaches.  All other systems reviewed and are negative.     Allergies  Review of patient's allergies indicates no known allergies.  Home Medications   Prior to Admission medications   Medication Sig Start Date End Date Taking? Authorizing Provider  ALPRAZolam Duanne Moron) 1 MG tablet Take 1 mg by mouth at bedtime as needed for anxiety or sleep.  08/07/14   Historical Provider, MD  B-Complex TABS Take 1 tablet by mouth daily.    Historical Provider, MD  buPROPion (WELLBUTRIN SR) 150 MG 12 hr tablet Take 150 mg by mouth 2 (two) times daily. 07/20/14   Historical Provider, MD  chlorproMAZINE (THORAZINE) 50 MG tablet Take 3 tablets (150 mg total) by mouth at bedtime. 01/31/13   Kathie Dike, MD  Cholecalciferol (VITAMIN D) 2000 UNITS CAPS Take 1 capsule by mouth daily.    Historical Provider, MD  gabapentin (NEURONTIN) 300  MG capsule Take 2 capsules (600 mg total) by mouth 3 (three) times daily. 01/09/14   Donika Keith Rake, DO  Green Tea, Camillia sinensis, (GREEN TEA PO) Take 1 capsule by mouth daily.    Historical Provider, MD  HYDROcodone-acetaminophen (NORCO) 10-325 MG per tablet Take 1 tablet by mouth daily as needed for moderate pain or severe pain.    Historical Provider, MD  hydrOXYzine (ATARAX/VISTARIL) 25 MG tablet Take 1 tablet (25 mg total) by mouth every 6 (six) hours. 03/29/15   Riggins Cisek, PA-C  Icosapent Ethyl 1 G CAPS Take 2 capsules by mouth daily.    Historical Provider, MD  Insulin Detemir (LEVEMIR FLEXTOUCH) 100 UNIT/ML Pen Inject 20 Units into the skin daily at 10 pm.    Historical Provider, MD  metFORMIN (GLUCOPHAGE) 500 MG tablet Take 500 mg by mouth 2 (two) times daily.    Historical Provider, MD  naproxen sodium (ANAPROX) 220 MG tablet Take 220-440 mg by mouth daily as needed (for pain).    Historical Provider, MD  Omega-3 Fatty Acids (FISH OIL) 1000 MG CAPS Take 1 capsule by mouth daily.    Historical Provider, MD  oxymetazoline (AFRIN) 0.05 % nasal spray Place 1 spray into both nostrils 2 (two) times daily as needed for congestion.    Historical Provider, MD  telmisartan-hydrochlorothiazide (MICARDIS HCT) 80-12.5 MG per tablet Take 1 tablet by mouth daily.    Historical Provider, MD  Vilazodone HCl (VIIBRYD) 40 MG TABS Take 40 mg by mouth daily.    Historical Provider, MD  vitamin B-12 (CYANOCOBALAMIN) 500 MCG tablet Take 500 mcg by mouth daily.    Historical Provider, MD   BP 125/74 mmHg  Pulse 97  Temp(Src) 97.6 F (36.4 C) (Tympanic)  Resp 18  Ht 5\' 1"  (1.549 m)  Wt 73.936 kg  BMI 30.81 kg/m2  SpO2 100% Physical Exam  Constitutional: She is oriented to person, place, and time. She appears well-developed and well-nourished.  Pt is scratching at her extremities, appears uncomfortable.  HENT:  Head: Normocephalic and atraumatic.  Mouth/Throat: Oropharynx is clear and moist.   Neck: Normal range of motion. Neck supple.  Cardiovascular: Normal rate, regular rhythm, normal heart sounds and intact distal pulses.   No murmur heard. Pulmonary/Chest: Effort normal and breath sounds normal. No respiratory distress.  Musculoskeletal: She exhibits no edema or tenderness.  Lymphadenopathy:    She has no cervical adenopathy.  Neurological: She is alert and oriented to person, place, and time. She exhibits normal muscle tone. Coordination normal.  Skin:  Skin is warm. Rash noted. There is erythema.  Hyperkeratotic macular lesions to the bilateral upper and lower extremities.    Nursing note and vitals reviewed.   ED Course  Procedures (including critical care time) Labs Review Labs Reviewed - No data to display  Imaging Review No results found. I have personally reviewed and evaluated these images and lab results as part of my medical decision-making.   EKG Interpretation None      MDM   Final diagnoses:  Lichen planus  Pruritus    Pt with hx of lichen planus.  Agrees to derm f/u.  Pt well appearing, agrees to symptomatic tx with vistaril and IM injection of depo-medrol  Kem Parkinson, PA-C 04/02/15 2116  Milton Ferguson, MD 04/05/15 1526

## 2015-04-04 ENCOUNTER — Emergency Department (HOSPITAL_COMMUNITY)
Admission: EM | Admit: 2015-04-04 | Discharge: 2015-04-04 | Disposition: A | Discharge status: Home / Self Care | Payer: Medicare HMO | Attending: Emergency Medicine | Admitting: Emergency Medicine

## 2015-04-04 ENCOUNTER — Encounter (HOSPITAL_COMMUNITY): Payer: Self-pay | Admitting: Emergency Medicine

## 2015-04-04 DIAGNOSIS — G4730 Sleep apnea, unspecified: Secondary | ICD-10-CM | POA: Diagnosis not present

## 2015-04-04 DIAGNOSIS — L439 Lichen planus, unspecified: Secondary | ICD-10-CM | POA: Diagnosis not present

## 2015-04-04 DIAGNOSIS — Z8619 Personal history of other infectious and parasitic diseases: Secondary | ICD-10-CM | POA: Insufficient documentation

## 2015-04-04 DIAGNOSIS — F419 Anxiety disorder, unspecified: Secondary | ICD-10-CM | POA: Diagnosis not present

## 2015-04-04 DIAGNOSIS — Z79899 Other long term (current) drug therapy: Secondary | ICD-10-CM | POA: Diagnosis not present

## 2015-04-04 DIAGNOSIS — I10 Essential (primary) hypertension: Secondary | ICD-10-CM | POA: Insufficient documentation

## 2015-04-04 DIAGNOSIS — F329 Major depressive disorder, single episode, unspecified: Secondary | ICD-10-CM | POA: Insufficient documentation

## 2015-04-04 DIAGNOSIS — G8929 Other chronic pain: Secondary | ICD-10-CM | POA: Diagnosis not present

## 2015-04-04 DIAGNOSIS — Z87891 Personal history of nicotine dependence: Secondary | ICD-10-CM | POA: Insufficient documentation

## 2015-04-04 DIAGNOSIS — Z85528 Personal history of other malignant neoplasm of kidney: Secondary | ICD-10-CM | POA: Diagnosis not present

## 2015-04-04 DIAGNOSIS — Z794 Long term (current) use of insulin: Secondary | ICD-10-CM | POA: Insufficient documentation

## 2015-04-04 DIAGNOSIS — Z85841 Personal history of malignant neoplasm of brain: Secondary | ICD-10-CM | POA: Insufficient documentation

## 2015-04-04 DIAGNOSIS — Z9981 Dependence on supplemental oxygen: Secondary | ICD-10-CM | POA: Diagnosis not present

## 2015-04-04 DIAGNOSIS — L299 Pruritus, unspecified: Secondary | ICD-10-CM | POA: Diagnosis present

## 2015-04-04 DIAGNOSIS — E119 Type 2 diabetes mellitus without complications: Secondary | ICD-10-CM | POA: Diagnosis not present

## 2015-04-04 MED ORDER — PREDNISONE 20 MG PO TABS: 40.0000 mg | ORAL_TABLET | Freq: Every day | ORAL | Status: DC

## 2015-04-04 MED ORDER — DIPHENHYDRAMINE HCL 25 MG PO TABS: 25.0000 mg | ORAL_TABLET | Freq: Four times a day (QID) | ORAL | Status: DC | PRN

## 2015-04-04 MED ORDER — PREDNISONE 50 MG PO TABS
60.0000 mg | ORAL_TABLET | Freq: Once | ORAL | Status: AC
  Administered 2015-04-04: 60 mg via ORAL
  Filled 2015-04-04: qty 1

## 2015-04-04 MED ORDER — DIPHENHYDRAMINE HCL 25 MG PO CAPS
25.0000 mg | ORAL_CAPSULE | Freq: Once | ORAL | Status: AC
  Administered 2015-04-04: 25 mg via ORAL
  Filled 2015-04-04: qty 1

## 2015-04-04 NOTE — ED Notes (Signed)
PT c/o itching all over and said was treated for same in ED on 03/29/15 with relief until 04/02/15. PT denies any tx for itching today and denies any rash or hives.

## 2015-04-04 NOTE — Discharge Instructions (Signed)
Cereve cream may help also  Lichen Planus Lichen planus is a skin problem that causes redness, itching, small bumps, and sores. It can affect the skin in any area of the body. Some common areas affected include:  Arms, wrists, legs, or ankles.  Chest, back, or abdomen.  Genital areas such as the vulva and vagina.  Gums and inside of the mouth.  Scalp.  Fingernails or toenails. Treatment can help control the symptoms of this condition. The condition can last for a long time. It can take 6-18 months for it to go away. CAUSES The exact cause of this condition is not known. The condition is not passed from one person to another (not contagious). It may be related to an allergy or an autoimmune response. An autoimmune response occurs when the body's defense system (immune system) mistakenly attacks healthy tissues. RISK FACTORS This condition is more likely to develop in:  People who are older than 68 years of age.  People who take certain medicines.  People who have been exposed to certain dyes or chemicals.  People with hepatitis C. SYMPTOMS Symptoms of this condition may include:  Itching, which can be severe.  Small reddish or purple bumps on the skin. These may have flat tops and may be round or irregular shaped.  Redness or white patches on the gums or tongue.  Redness, soreness, or a burning feeling in the genital area. This may lead to pain or bleeding during sex.  Changes in the fingernails or toenails. The nails may become thin or rough. They may have ridges in them.  Redness or irritation of the eyes. This is rare. DIAGNOSIS This condition may be diagnosed based on:   A physical exam. The health care provider will examine your affected skin and check for changes inside your mouth.  Removal of a tissue sample (biopsy sample) to be looked at under a microscope. TREATMENT Treatment for this condition may depend on the severity of symptoms. In some cases, no  treatment is needed. If treatment is needed to control symptoms, it may include:  Creams or ointments (topical steroids) to help control itching and irritation.  Medicine to be taken by mouth.  A treatment in which your skin is exposed to ultraviolet light (phototherapy).  Lozenges that you suck on to help treat sores in the mouth. HOME CARE INSTRUCTIONS  Take over-the-counter and prescription medicines only as told by your health care provider.  Use creams or ointments as told by your health care provider.  Do not scratch the affected areas of skin.  Women should keep the vaginal area as clean and dry as possible. SEEK MEDICAL CARE IF:  You have increasing redness, swelling, or pain in the affected area.  You have fluid, blood, or pus coming from the affected area.  Your eyes become irritated.   This information is not intended to replace advice given to you by your health care provider. Make sure you discuss any questions you have with your health care provider.   Document Released: 08/04/2010 Document Revised: 12/02/2014 Document Reviewed: 06/08/2014 Elsevier Interactive Patient Education [employer].

## 2015-04-04 NOTE — ED Provider Notes (Signed)
CSN: [id number]     Arrival date & time 04/04/15  1816 History   First MD Initiated Contact with Patient 04/04/15 1918     Chief Complaint  Patient presents with  . Pruritis      The history is provided by the patient.   patient presents with itching. Has a history of lichen planus. States she's been doing with it for years. States it began a couple weeks ago. She was seen in the ER. She was treated with Atarax and Depo-Medrol. States it helped for a day or 2 then came back. No fevers. States she is itching all over but is worse on her hands. States she is seeing dermatology in her primary care for this in the past. States her flares usually do not last this long. Otherwise she states is his typical flare for her. No fevers or chills. She states it is in her mouth also.   Past Medical History  Diagnosis Date  . Brain tumor [facility])     Surgery for benign brain tumor in the remote past  . Hypertension   . Preop cardiovascular exam     Cardiac clearance for knee surgery, January, 2013  . Edema   . Tobacco abuse   . Ejection fraction     EF 70%, echo, February, 2013  . Sleep apnea     uses Cpap  . Hepatitis 1970's    hep c  . Anxiety   . 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 Nasal congestion   . Diabetes mellitus without complication ([place])   . Insomnia   . Spinal cord stimulator status     for pain   . Chronic knee pain   . Chronic back pain     with radiculopathy bilat legs  . Depression   . Arthritis   . Cancer ([place]) 2006    renal Rt  . 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 Abdominal hysterectomy    . Knee surgery    . Nose surgery    . Back surgery      pt has stimulator in lower back and can't have an MRI  . Total knee arthroplasty  07/21/2011    Procedure: TOTAL KNEE ARTHROPLASTY;  Surgeon: Yvette Rack., MD;  Location: [place];  Service: Orthopedics;  Laterality: Left;  .  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 Cholecystectomy N/A 09/30/2012    Procedure: LAPAROSCOPIC CHOLECYSTECTOMY;  Surgeon: Donato Heinz, MD;  Location: [facility];  Service: General;  Laterality: N/A;   Family History  Problem Relation Age of Onset  . Cancer Mother   . Hypertension Mother   . Anesthesia problems Neg Hx   . Diabetes Mother   . Diabetes Brother    Social History  Substance Use Topics  . Smoking status: Former Smoker -- 0.50 packs/day for 10 years    Types: Cigarettes    Quit date: 12/26/2013  . Smokeless tobacco: Never Used  . Alcohol Use: No     Comment: none since 03-28-2011   OB History    Gravida Para Term Preterm AB TAB SAB Ectopic Multiple Living   1 1 1       1      Review of Systems  Constitutional: Negative for appetite change and fatigue.  HENT: Negative for facial swelling.  Respiratory: Negative for shortness of breath.   Cardiovascular: Negative for chest pain.  Gastrointestinal: Negative for abdominal pain.  Genitourinary: Negative for frequency.  Skin: Positive for rash.       Pruritus  Neurological: Negative for light-headedness.      Allergies  Review of patient's allergies indicates no known allergies.  Home Medications   Prior to Admission medications   Medication Sig Start Date End Date Taking? Authorizing Provider  ALPRAZolam Duanne Moron) 1 MG tablet Take 1 mg by mouth at bedtime as needed for anxiety or sleep.  08/07/14   Historical Provider, MD  B-Complex TABS Take 1 tablet by mouth daily.    Historical Provider, MD  buPROPion (WELLBUTRIN SR) 150 MG 12 hr tablet Take 150 mg by mouth 2 (two) times daily. 07/20/14   Historical Provider, MD  chlorproMAZINE (THORAZINE) 50 MG tablet Take 3 tablets (150 mg total) by mouth at bedtime. 01/31/13   Kathie Dike, MD  Cholecalciferol (VITAMIN D) 2000 UNITS CAPS Take 1 capsule by mouth daily.    Historical Provider, MD  diphenhydrAMINE (BENADRYL) 25 MG tablet Take 1 tablet (25 mg total) by mouth  every 6 (six) hours as needed for itching. 04/04/15   Davonna Belling, MD  gabapentin (NEURONTIN) 300 MG capsule Take 2 capsules (600 mg total) by mouth 3 (three) times daily. 01/09/14   Donika Keith Rake, DO  Green Tea, Camillia sinensis, (GREEN TEA PO) Take 1 capsule by mouth daily.    Historical Provider, MD  HYDROcodone-acetaminophen (NORCO) 10-325 MG per tablet Take 1 tablet by mouth daily as needed for moderate pain or severe pain.    Historical Provider, MD  hydrOXYzine (ATARAX/VISTARIL) 25 MG tablet Take 1 tablet (25 mg total) by mouth every 6 (six) hours. 03/29/15   Tammy Triplett, PA-C  Icosapent Ethyl 1 G CAPS Take 2 capsules by mouth daily.    Historical Provider, MD  Insulin Detemir (LEVEMIR FLEXTOUCH) 100 UNIT/ML Pen Inject 20 Units into the skin daily at 10 pm.    Historical Provider, MD  metFORMIN (GLUCOPHAGE) 500 MG tablet Take 500 mg by mouth 2 (two) times daily.    Historical Provider, MD  naproxen sodium (ANAPROX) 220 MG tablet Take 220-440 mg by mouth daily as needed (for pain).    Historical Provider, MD  Omega-3 Fatty Acids (FISH OIL) 1000 MG CAPS Take 1 capsule by mouth daily.    Historical Provider, MD  oxymetazoline (AFRIN) 0.05 % nasal spray Place 1 spray into both nostrils 2 (two) times daily as needed for congestion.    Historical Provider, MD  predniSONE (DELTASONE) 20 MG tablet Take 2 tablets (40 mg total) by mouth daily. 04/05/15   Davonna Belling, MD  telmisartan-hydrochlorothiazide (MICARDIS HCT) 80-12.5 MG per tablet Take 1 tablet by mouth daily.    Historical Provider, MD  Vilazodone HCl (VIIBRYD) 40 MG TABS Take 40 mg by mouth daily.    Historical Provider, MD  vitamin B-12 (CYANOCOBALAMIN) 500 MCG tablet Take 500 mcg by mouth daily.    Historical Provider, MD   BP 147/118 mmHg  Pulse 88  Temp(Src) 98 F (36.7 C) (Oral)  Resp 20  Ht 5\' 1"  (1.549 m)  Wt 163 lb (73.936 kg)  BMI 30.81 kg/m2  SpO2 99% Physical Exam  Constitutional: She appears well-developed and  well-nourished.  HENT:  Head: Atraumatic.  Eyes: EOM are normal.  Cardiovascular: Normal rate.   Pulmonary/Chest: Breath sounds normal.  Abdominal: Soft. There is no tenderness.  Musculoskeletal: She exhibits no edema.  Neurological: She is alert.  Skin:  Few excoriations over body. Few macular areas on bilateral hands. No lesion seen on mucous membranes.    ED Course  Procedures (including critical care time) Labs Review Labs Reviewed - No data to display  Imaging Review No results found. I have personally reviewed and evaluated these images and lab results as part of my medical decision-making.   EKG Interpretation None      MDM   Final diagnoses:  Lichen planus    Patient with history of lichen planus, likely a flare. Pruritic but otherwise seems rather mild. Will treat with more sterile it's and Benadryl will follow-up with her primary care doctor or dermatologist.    Davonna Belling, MD 04/04/15 [phone]

## 2015-05-11 ENCOUNTER — Telehealth: Payer: Self-pay | Admitting: *Deleted

## 2015-05-11 MED ORDER — HYDROCODONE-ACETAMINOPHEN 7.5-325 MG PO TABS: 1.0000 | ORAL_TABLET | ORAL | Status: DC | PRN

## 2015-05-11 NOTE — Telephone Encounter (Signed)
Patient called requesting to get her rx for hydrocodone 7.5/325 qty 120 patient said she still has some left but she is going out of town for a week. Please advise [phone]

## 2015-05-11 NOTE — Telephone Encounter (Signed)
Prescription available, patient aware  

## 2015-05-11 NOTE — Telephone Encounter (Signed)
Rx printed

## 2015-05-16 ENCOUNTER — Emergency Department (HOSPITAL_COMMUNITY)
Admission: EM | Admit: 2015-05-16 | Discharge: 2015-05-16 | Disposition: A | Discharge status: Home / Self Care | Payer: Medicare HMO | Attending: Emergency Medicine | Admitting: Emergency Medicine

## 2015-05-16 ENCOUNTER — Encounter (HOSPITAL_COMMUNITY): Payer: Self-pay | Admitting: Emergency Medicine

## 2015-05-16 DIAGNOSIS — E119 Type 2 diabetes mellitus without complications: Secondary | ICD-10-CM | POA: Diagnosis not present

## 2015-05-16 DIAGNOSIS — G8929 Other chronic pain: Secondary | ICD-10-CM | POA: Insufficient documentation

## 2015-05-16 DIAGNOSIS — Z9689 Presence of other specified functional implants: Secondary | ICD-10-CM | POA: Diagnosis not present

## 2015-05-16 DIAGNOSIS — Z794 Long term (current) use of insulin: Secondary | ICD-10-CM | POA: Insufficient documentation

## 2015-05-16 DIAGNOSIS — F329 Major depressive disorder, single episode, unspecified: Secondary | ICD-10-CM | POA: Insufficient documentation

## 2015-05-16 DIAGNOSIS — I10 Essential (primary) hypertension: Secondary | ICD-10-CM | POA: Diagnosis not present

## 2015-05-16 DIAGNOSIS — Z79899 Other long term (current) drug therapy: Secondary | ICD-10-CM | POA: Diagnosis not present

## 2015-05-16 DIAGNOSIS — Z85528 Personal history of other malignant neoplasm of kidney: Secondary | ICD-10-CM | POA: Insufficient documentation

## 2015-05-16 DIAGNOSIS — Z9981 Dependence on supplemental oxygen: Secondary | ICD-10-CM | POA: Diagnosis not present

## 2015-05-16 DIAGNOSIS — Z872 Personal history of diseases of the skin and subcutaneous tissue: Secondary | ICD-10-CM | POA: Insufficient documentation

## 2015-05-16 DIAGNOSIS — G4700 Insomnia, unspecified: Secondary | ICD-10-CM | POA: Insufficient documentation

## 2015-05-16 DIAGNOSIS — F419 Anxiety disorder, unspecified: Secondary | ICD-10-CM | POA: Diagnosis not present

## 2015-05-16 DIAGNOSIS — M545 Low back pain, unspecified: Secondary | ICD-10-CM

## 2015-05-16 DIAGNOSIS — Z9889 Other specified postprocedural states: Secondary | ICD-10-CM | POA: Diagnosis not present

## 2015-05-16 DIAGNOSIS — Z86011 Personal history of benign neoplasm of the brain: Secondary | ICD-10-CM | POA: Insufficient documentation

## 2015-05-16 DIAGNOSIS — Z7984 Long term (current) use of oral hypoglycemic drugs: Secondary | ICD-10-CM | POA: Diagnosis not present

## 2015-05-16 DIAGNOSIS — Z87891 Personal history of nicotine dependence: Secondary | ICD-10-CM | POA: Insufficient documentation

## 2015-05-16 DIAGNOSIS — G4730 Sleep apnea, unspecified: Secondary | ICD-10-CM | POA: Diagnosis not present

## 2015-05-16 DIAGNOSIS — Z87448 Personal history of other diseases of urinary system: Secondary | ICD-10-CM | POA: Diagnosis not present

## 2015-05-16 LAB — CBG MONITORING, ED: Glucose-Capillary: 169 mg/dL — ABNORMAL HIGH (ref 65–99)

## 2015-05-16 MED ORDER — HYDROMORPHONE HCL 1 MG/ML IJ SOLN
1.0000 mg | Freq: Once | INTRAMUSCULAR | Status: AC
  Administered 2015-05-16: 1 mg via INTRAVENOUS
  Filled 2015-05-16: qty 1

## 2015-05-16 MED ORDER — HYDROCODONE-ACETAMINOPHEN 5-325 MG PO TABS: 1.0000 | ORAL_TABLET | Freq: Four times a day (QID) | ORAL | Status: DC | PRN

## 2015-05-16 MED ORDER — HYDROXYZINE HCL 50 MG/ML IM SOLN
50.0000 mg | Freq: Once | INTRAMUSCULAR | Status: AC
  Administered 2015-05-16: 50 mg via INTRAMUSCULAR
  Filled 2015-05-16: qty 1

## 2015-05-16 MED ORDER — HYDROXYZINE HCL 50 MG/ML IM SOLN: 100.0000 mg | Freq: Once | INTRAMUSCULAR | Status: DC

## 2015-05-16 MED ORDER — HYDROXYZINE HCL 25 MG PO TABS: 25.0000 mg | ORAL_TABLET | ORAL | Status: DC | PRN

## 2015-05-16 NOTE — ED Notes (Signed)
Patient c/o lower back pain with nausea and vomiting. Denies any diarrhea or fevers. Per patient frequent urination, states that she is diabetic. Patient states that she feels dehydrated and is fatigued. Patient also c/o itching, denies rash. Per patient has Lichen planus.

## 2015-05-16 NOTE — ED Provider Notes (Signed)
CSN: [id number]     Arrival date & time 05/16/15  1757 History   First MD Initiated Contact with Patient 05/16/15 1920     Chief Complaint  Patient presents with  . Back Pain     (Consider location/radiation/quality/duration/timing/severity/associated sxs/prior Treatment) Patient is a 70 y.o. female presenting with back pain. The history is provided by the patient (Patient complains of pain in her lower back. She has a history of back pain. She also complains of itching all over and states this occurs when her lichen plantaris asks up).  Back Pain Location:  Lumbar spine Quality:  Aching Radiates to:  Does not radiate Pain severity:  Moderate Pain is:  Same all the time Onset quality:  Sudden Timing:  Constant Progression:  Worsening Chronicity:  Recurrent Associated symptoms: no abdominal pain, no chest pain and no headaches     Past Medical History  Diagnosis Date  . Brain tumor [facility])     Surgery for benign brain tumor in the remote past  . Hypertension   . Preop cardiovascular exam     Cardiac clearance for knee surgery, January, 2013  . Edema   . Tobacco abuse   . Ejection fraction     EF 70%, echo, February, 2013  . Sleep apnea     uses Cpap  . Hepatitis 1970's    hep c  . Anxiety   . 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 Nasal congestion   . Diabetes mellitus without complication ([place])   . Insomnia   . Spinal cord stimulator status     for pain   . Chronic knee pain   . Chronic back pain     with radiculopathy bilat legs  . Depression   . Arthritis   . 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 Abdominal hysterectomy    . Knee surgery    . Nose surgery    . Back surgery      pt has stimulator in lower back and can't have an MRI  . Total knee arthroplasty  07/21/2011    Procedure: TOTAL KNEE ARTHROPLASTY;  Surgeon: Yvette Rack., MD;  Location: [place];  Service: Orthopedics;  Laterality: Left;  . 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 Cholecystectomy N/A 09/30/2012    Procedure: LAPAROSCOPIC CHOLECYSTECTOMY;  Surgeon: Donato Heinz, MD;  Location: [facility];  Service: General;  Laterality: N/A;   Family History  Problem Relation Age of Onset  . Cancer Mother   . Hypertension Mother   . Anesthesia problems Neg Hx   . Diabetes Mother   . Diabetes Brother    Social History  Substance Use Topics  . Smoking status: Former Smoker -- 0.50 packs/day for 10 years    Types: Cigarettes    Quit date: 12/26/2013  . Smokeless tobacco: Never Used  . Alcohol Use: No     Comment: none since 03-28-2011   OB History    Gravida Para Term Preterm AB TAB SAB Ectopic Multiple Living   1 1 1       1      Review of Systems  Constitutional: Negative for appetite change and fatigue.  HENT: Negative for congestion, ear discharge and sinus pressure.   Eyes: Negative for discharge.  Respiratory:  Negative for cough.   Cardiovascular: Negative for chest pain.  Gastrointestinal: Negative for abdominal pain and diarrhea.  Genitourinary: Negative for frequency and hematuria.  Musculoskeletal: Positive for back pain.  Skin: Negative for rash.  Neurological: Negative for seizures and headaches.  Psychiatric/Behavioral: Negative for hallucinations.      Allergies  Review of patient's allergies indicates no known allergies.  Home Medications   Prior to Admission medications   Medication Sig Start Date End Date Taking? Authorizing Provider  ASHWAGANDHA PO Take 1 capsule by mouth daily.   Yes Historical Provider, MD  gabapentin (NEURONTIN) 100 MG capsule Take 100 mg by mouth 3 (three) times daily. 04/19/15  Yes Historical Provider, MD  hydrochlorothiazide (MICROZIDE) 12.5 MG capsule Take 1 capsule by mouth daily. 04/19/15  Yes Historical Provider, MD  ibuprofen (ADVIL,MOTRIN) 200 MG tablet Take 200 mg by mouth every 6 (six)  hours as needed for mild pain or moderate pain.   Yes Historical Provider, MD  Insulin Detemir (LEVEMIR FLEXTOUCH) 100 UNIT/ML Pen Inject 20 Units into the skin daily at 10 pm.   Yes Historical Provider, MD  metFORMIN (GLUCOPHAGE) 500 MG tablet Take 500 mg by mouth 2 (two) times daily.   Yes Historical Provider, MD  Omega-3 Fatty Acids (FISH OIL) 1000 MG CAPS Take 1 capsule by mouth daily.   Yes Historical Provider, MD  telmisartan (MICARDIS) 80 MG tablet Take 80 mg by mouth daily. 04/19/15  Yes Historical Provider, MD  diphenhydrAMINE (BENADRYL) 25 MG tablet Take 1 tablet (25 mg total) by mouth every 6 (six) hours as needed for itching. Patient not taking: Reported on 05/16/2015 04/04/15   Davonna Belling, MD  HYDROcodone-acetaminophen (NORCO/VICODIN) 5-325 MG tablet Take 1 tablet by mouth every 6 (six) hours as needed. 05/16/15   Milton Ferguson, MD  hydrOXYzine (ATARAX/VISTARIL) 25 MG tablet Take 1 tablet (25 mg total) by mouth every 4 (four) hours as needed for itching. 05/16/15   Milton Ferguson, MD  predniSONE (DELTASONE) 20 MG tablet Take 2 tablets (40 mg total) by mouth daily. Patient not taking: Reported on 05/16/2015 04/05/15   Davonna Belling, MD   BP 106/91 mmHg  Pulse 96  Temp(Src) 99.9 F (37.7 C) (Oral)  Resp 16  Ht 5\' 1"  (1.549 m)  Wt 169 lb (76.658 kg)  BMI 31.95 kg/m2  SpO2 99% Physical Exam  Constitutional: She is oriented to person, place, and time. She appears well-developed.  HENT:  Head: Normocephalic.  Eyes: Conjunctivae and EOM are normal. No scleral icterus.  Neck: Neck supple. No thyromegaly present.  Cardiovascular: Normal rate and regular rhythm.  Exam reveals no gallop and no friction rub.   No murmur heard. Pulmonary/Chest: No stridor. She has no wheezes. She has no rales. She exhibits no tenderness.  Abdominal: She exhibits no distension. There is no tenderness. There is no rebound.  Musculoskeletal: Normal range of motion. She exhibits no edema.  Tender lumbar  spine  Lymphadenopathy:    She has no cervical adenopathy.  Neurological: She is oriented to person, place, and time. She has normal reflexes. She exhibits normal muscle tone. Coordination normal.  Negative straight leg raise  Skin: No rash noted. No erythema.  Psychiatric: She has a normal mood and affect. Her behavior is normal.    ED Course  Procedures (including critical care time) Labs Review Labs Reviewed  CBG MONITORING, ED - Abnormal; Notable for the following:    Glucose-Capillary 169 (*)    All other components within normal limits  Imaging Review No results found. I have personally reviewed and evaluated these images and lab results as part of my medical decision-making.   EKG Interpretation None      MDM   Final diagnoses:  Back pain at L4-L5 level    Exacerbation of chronic back pain. Also pruritus from lichen Lantus. Patient given Vicodin and Vistaril and will follow-up with PCP    Milton Ferguson, MD 05/16/15 2014

## 2015-05-16 NOTE — Discharge Instructions (Signed)
Follow up with your family md this week °

## 2015-05-18 ENCOUNTER — Emergency Department (HOSPITAL_COMMUNITY)
Admission: EM | Admit: 2015-05-18 | Discharge: 2015-05-18 | Disposition: A | Discharge status: Home / Self Care | Payer: Medicare HMO | Attending: Emergency Medicine | Admitting: Emergency Medicine

## 2015-05-18 ENCOUNTER — Encounter (HOSPITAL_COMMUNITY): Payer: Self-pay | Admitting: Cardiology

## 2015-05-18 DIAGNOSIS — Z9981 Dependence on supplemental oxygen: Secondary | ICD-10-CM | POA: Insufficient documentation

## 2015-05-18 DIAGNOSIS — M1990 Unspecified osteoarthritis, unspecified site: Secondary | ICD-10-CM | POA: Insufficient documentation

## 2015-05-18 DIAGNOSIS — G8929 Other chronic pain: Secondary | ICD-10-CM | POA: Diagnosis not present

## 2015-05-18 DIAGNOSIS — Z794 Long term (current) use of insulin: Secondary | ICD-10-CM | POA: Insufficient documentation

## 2015-05-18 DIAGNOSIS — F329 Major depressive disorder, single episode, unspecified: Secondary | ICD-10-CM | POA: Insufficient documentation

## 2015-05-18 DIAGNOSIS — G4700 Insomnia, unspecified: Secondary | ICD-10-CM | POA: Insufficient documentation

## 2015-05-18 DIAGNOSIS — Z8619 Personal history of other infectious and parasitic diseases: Secondary | ICD-10-CM | POA: Diagnosis not present

## 2015-05-18 DIAGNOSIS — F419 Anxiety disorder, unspecified: Secondary | ICD-10-CM

## 2015-05-18 DIAGNOSIS — Z859 Personal history of malignant neoplasm, unspecified: Secondary | ICD-10-CM | POA: Insufficient documentation

## 2015-05-18 DIAGNOSIS — E119 Type 2 diabetes mellitus without complications: Secondary | ICD-10-CM | POA: Diagnosis not present

## 2015-05-18 DIAGNOSIS — I10 Essential (primary) hypertension: Secondary | ICD-10-CM | POA: Diagnosis not present

## 2015-05-18 DIAGNOSIS — Z87448 Personal history of other diseases of urinary system: Secondary | ICD-10-CM | POA: Insufficient documentation

## 2015-05-18 DIAGNOSIS — Z7984 Long term (current) use of oral hypoglycemic drugs: Secondary | ICD-10-CM | POA: Diagnosis not present

## 2015-05-18 DIAGNOSIS — Z872 Personal history of diseases of the skin and subcutaneous tissue: Secondary | ICD-10-CM | POA: Diagnosis not present

## 2015-05-18 DIAGNOSIS — Z86011 Personal history of benign neoplasm of the brain: Secondary | ICD-10-CM | POA: Insufficient documentation

## 2015-05-18 DIAGNOSIS — G4730 Sleep apnea, unspecified: Secondary | ICD-10-CM | POA: Diagnosis not present

## 2015-05-18 DIAGNOSIS — Z87891 Personal history of nicotine dependence: Secondary | ICD-10-CM | POA: Diagnosis not present

## 2015-05-18 DIAGNOSIS — M549 Dorsalgia, unspecified: Secondary | ICD-10-CM | POA: Insufficient documentation

## 2015-05-18 DIAGNOSIS — Z79899 Other long term (current) drug therapy: Secondary | ICD-10-CM | POA: Diagnosis not present

## 2015-05-18 DIAGNOSIS — F32A Depression, unspecified: Secondary | ICD-10-CM

## 2015-05-18 LAB — RAPID URINE DRUG SCREEN, HOSP PERFORMED
AMPHETAMINES: NOT DETECTED
BENZODIAZEPINES: NOT DETECTED
Barbiturates: NOT DETECTED
COCAINE: NOT DETECTED
Opiates: NOT DETECTED
Tetrahydrocannabinol: NOT DETECTED

## 2015-05-18 LAB — COMPREHENSIVE METABOLIC PANEL
ALT: 12 U/L — ABNORMAL LOW (ref 14–54)
ANION GAP: 14 (ref 5–15)
AST: 19 U/L (ref 15–41)
Albumin: 3.7 g/dL (ref 3.5–5.0)
Alkaline Phosphatase: 87 U/L (ref 38–126)
BUN: 15 mg/dL (ref 6–20)
CHLORIDE: 102 mmol/L (ref 101–111)
CO2: 27 mmol/L (ref 22–32)
Calcium: 9.8 mg/dL (ref 8.9–10.3)
Creatinine, Ser: 1.04 mg/dL — ABNORMAL HIGH (ref 0.44–1.00)
GFR calc non Af Amer: 54 mL/min — ABNORMAL LOW (ref >60)
Glucose, Bld: 124 mg/dL — ABNORMAL HIGH (ref 65–99)
Potassium: 3.8 mmol/L (ref 3.5–5.1)
Sodium: 143 mmol/L (ref 135–145)
Total Bilirubin: 0.1 mg/dL — ABNORMAL LOW (ref 0.3–1.2)
Total Protein: 7.7 g/dL (ref 6.5–8.1)

## 2015-05-18 LAB — CBC
HEMATOCRIT: 39.4 % (ref 36.0–46.0)
Hemoglobin: 12.6 g/dL (ref 12.0–15.0)
MCH: 27.6 pg (ref 26.0–34.0)
MCHC: 32 g/dL (ref 30.0–36.0)
MCV: 86.2 fL (ref 78.0–100.0)
PLATELETS: 300 10*3/uL (ref 150–400)
RBC: 4.57 MIL/uL (ref 3.87–5.11)
RDW: 15 % (ref 11.5–15.5)
WBC: 11.8 10*3/uL — AB (ref 4.0–10.5)

## 2015-05-18 LAB — URINALYSIS, ROUTINE W REFLEX MICROSCOPIC
Bilirubin Urine: NEGATIVE
Glucose, UA: NEGATIVE mg/dL
KETONES UR: NEGATIVE mg/dL
LEUKOCYTES UA: NEGATIVE
NITRITE: NEGATIVE
PH: 5.5 (ref 5.0–8.0)
PROTEIN: 30 mg/dL — AB
Specific Gravity, Urine: 1.023 (ref 1.005–1.030)

## 2015-05-18 LAB — URINE MICROSCOPIC-ADD ON

## 2015-05-18 LAB — ETHANOL: Alcohol, Ethyl (B): <5 mg/dL (ref <5)

## 2015-05-18 MED ORDER — LORAZEPAM 0.5 MG PO TABS: 1.0000 mg | ORAL_TABLET | Freq: Three times a day (TID) | ORAL | Status: DC | PRN

## 2015-05-18 NOTE — BH Assessment (Addendum)
Tele Assessment Note   Rebecca Richards is an 70 y.o. female. Presenting to ED with moderate-severe anxiety levels. Pt states " I just don't feel right". Pt stated that she was nervous and anxious. On a subjective scale of 1-10, 1 being no anxiety, pt rated her level at a 10. Pt identified onset of increased anxiety to be sometime in 01/2015. Pt also reports feeling weakness in her legs and a "sensation of being stuck by pins" all over.  Pt reports h/o depression and anxiety and that she has been non-compliant with medications since "months ago". Pt reports difficulty with memory. Pt reports no h/o SA, SI, HI, hallucinations, inpatient admissions or self-injurious behaviors. Pt reported no paranoia or delusions. Pt identified her son, with whom she resides, as a support. Pt is not currently followed by any outpatient psychiatric providers.   Pt is requesting assistance with pin sensation and medication for anxiety and depression.   Pt presented as oriented with unimpaired judgement. Pt was observantly anxious throughout assessment with restless motor activity and decreased concentration. Pt rocked back and forth while sitting during assessment. Pt speech was logical and coherent.   Diagnosis: GAD, Depression (per pt report)  Past Medical History:  Past Medical History  Diagnosis Date  . Brain tumor [facility])     Surgery for benign brain tumor in the remote past  . Hypertension   . Preop cardiovascular exam     Cardiac clearance for knee surgery, January, 2013  . Edema   . Tobacco abuse   . Ejection fraction     EF 70%, echo, February, 2013  . Sleep apnea     uses Cpap  . Hepatitis 1970's    hep c  . Anxiety   . 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 Nasal congestion   . Diabetes mellitus without complication ([place])   . Insomnia   . Spinal cord stimulator status     for pain   . Chronic knee pain   . Chronic back pain     with radiculopathy bilat legs  . Depression   . Arthritis   . Cancer  ([place]) 2006    renal Rt  . Renal disorder     was diagnosed with kidney CA but tumor shrinked without intervention    Past Surgical History  Procedure Laterality Date  . Abdominal hysterectomy    . Knee surgery    . Nose surgery    . Back surgery      pt has stimulator in lower back and can't have an MRI  . Total knee arthroplasty  07/21/2011    Procedure: TOTAL KNEE ARTHROPLASTY;  Surgeon: Yvette Rack., MD;  Location: [place];  Service: Orthopedics;  Laterality: Left;  . 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 Cholecystectomy N/A 09/30/2012    Procedure: LAPAROSCOPIC CHOLECYSTECTOMY;  Surgeon: Donato Heinz, MD;  Location: [facility];  Service: General;  Laterality: N/A;    Family History:  Family History  Problem Relation Age of Onset  . Cancer Mother   . Hypertension Mother   . Anesthesia problems Neg Hx   . Diabetes Mother   . Diabetes Brother     Social History:  reports that she quit smoking about 16 months ago. Her smoking use included Cigarettes. She has a 5 pack-year smoking history. She has never used smokeless tobacco. She reports that she does not drink alcohol or use illicit drugs.  Additional  Social History:  Alcohol / Drug Use Pain Medications: None Reported Prescriptions: None Reported Over the Counter: None Reported History of alcohol / drug use?: No history of alcohol / drug abuse Longest period of sobriety (when/how long):  (None Reported) Negative Consequences of Use:  (None Reported)  CIWA: CIWA-Ar BP: 135/92 mmHg Pulse Rate: 77 COWS:    PATIENT STRENGTHS: (choose at least two) Average or above average intelligence Capable of independent living Communication skills Supportive family/friends  Allergies: No Known Allergies  Home Medications:  (Not in a hospital admission)  OB/GYN Status:  No LMP recorded. Patient has had a hysterectomy.  General Assessment Data Location of Assessment: [facility] ED TTS Assessment: In system Is this a Tele or  Face-to-Face Assessment?: Tele Assessment Is this an Initial Assessment or a Re-assessment for this encounter?: Initial Assessment Marital status: Single Maiden name: NA Is patient pregnant?: No Pregnancy Status: No Living Arrangements: Children (Son) Can pt return to current living arrangement?: Yes Admission Status: Voluntary Is patient capable of signing voluntary admission?: Yes Referral Source: Self/Family/Friend Insurance type: [place] Living Arrangements: Children (Son) Name of Psychiatrist: None Name of Therapist: None  Education Status Is patient currently in school?: No Highest grade of school patient has completed: "31 and a half years" - pt did graduate from [employer] Name of school: NA Contact person: Marigene Ehlers (Son)[phone]  Risk to self with the past 6 months Suicidal Ideation: No Has patient been a risk to self within the past 6 months prior to admission? : No Suicidal Intent: No Has patient had any suicidal intent within the past 6 months prior to admission? : No Is patient at risk for suicide?: No Suicidal Plan?: No Has patient had any suicidal plan within the past 6 months prior to admission? : No What has been your use of drugs/alcohol within the last 12 months?: None reported Previous Attempts/Gestures: No How many times?: 0 Other Self Harm Risks: None Reported Intentional Self Injurious Behavior: None Family Suicide History: No Recent stressful life event(s):  (None reported) Persecutory voices/beliefs?: No Depression: Yes Depression Symptoms: Loss of interest in usual pleasures, Feeling angry/irritable Substance abuse history and/or treatment for substance abuse?: No Suicide prevention information given to non-admitted patients: Not applicable  Risk to Others within the past 6 months Homicidal Ideation: No Does patient have any lifetime risk of violence toward others beyond the six months prior to admission? : No Thoughts of Harm to  Others: No Current Homicidal Intent: No Current Homicidal Plan: No Access to Homicidal Means: No Identified Victim: Na History of harm to others?: No Assessment of Violence: None Noted Does patient have access to weapons?: Yes (Comment) (Access to Guns) Criminal Charges Pending?: No Does patient have a court date: No Is patient on probation?: No  Psychosis Hallucinations: None noted Delusions: None noted  Mental Status Report Appearance/Hygiene: Unremarkable Eye Contact: Good Motor Activity: Restlessness (Rocking back and forth) Speech: Logical/coherent Level of Consciousness: Alert, Restless Mood: Anxious Affect: Anxious Anxiety Level: Severe Thought Processes: Relevant, Coherent Judgement: Unimpaired Orientation: Person, Place, Time, Situation, Appropriate for developmental age Obsessive Compulsive Thoughts/Behaviors: None  Cognitive Functioning Concentration: Decreased Memory: Remote Intact, Recent Intact (Pt does report difficulty with memory) IQ: Average Insight: Good Impulse Control: Fair Appetite: Fair Weight Loss: 15 ("Since Christmas" Not purposeful) Weight Gain: 0 Sleep: Decreased Total Hours of Sleep: 2  ADLScreening [facility] Assessment Services) Patient's cognitive ability adequate to safely complete daily activities?: Yes Patient able to express need for  assistance with ADLs?: Yes Independently performs ADLs?: Yes (appropriate for developmental age)  Prior Inpatient Therapy Prior Inpatient Therapy: No  Prior Outpatient Therapy Prior Outpatient Therapy: Yes Prior Therapy Dates: "a couple years back I guess" Prior Therapy Facilty/Provider(s): "I can't remember his name" Reason for Treatment: Depression Does patient have an ACCT team?: No Does patient have Intensive In-House Services?  : No Does patient have [facility] services? : No Does patient have P4CC services?: No  ADL Screening (condition at time of admission) Patient's cognitive ability adequate  to safely complete daily activities?: Yes Is the patient deaf or have difficulty hearing?: No Does the patient have difficulty seeing, even when wearing glasses/contacts?: No Does the patient have difficulty concentrating, remembering, or making decisions?: Yes Patient able to express need for assistance with ADLs?: Yes Does the patient have difficulty dressing or bathing?: No Independently performs ADLs?: Yes (appropriate for developmental age) Does the patient have difficulty walking or climbing stairs?: No Weakness of Legs: Both (Sensation of being stuck by pins ) Weakness of Arms/Hands: Both (Sensation of being stuck by pins )  Home Assistive Devices/Equipment Home Assistive Devices/Equipment: None  Therapy Consults (therapy consults require a physician order) PT Evaluation Needed: No OT Evalulation Needed: No SLP Evaluation Needed: No Abuse/Neglect Assessment (Assessment to be complete while patient is alone) Physical Abuse: Denies Verbal Abuse: Denies Sexual Abuse: Denies Exploitation of patient/patient's resources: Denies Self-Neglect: Denies Values / Beliefs Cultural Requests During Hospitalization: None Spiritual Requests During Hospitalization: None Consults Spiritual Care Consult Needed: No Social Work Consult Needed: No Regulatory affairs officer (For Healthcare) Does patient have an advance directive?: Yes Type of Advance Directive: Press photographer Does patient want to make changes to advanced directive?: No - Patient declined Copy of advanced directive(s) in chart?: No - copy requested    Additional Information 1:1 In Past 12 Months?: No CIRT Risk: No Elopement Risk: No Does patient have medical clearance?: No     Disposition: Per Patriciaann Clan, PA pt does not meet criteria for inpatient admission. PA recommends pt be provided with outpatient community resources for counseling and medication management. Pt RN Raquel Sarna) and Josephina Gip, PA have been  informed of pt disposition.  Disposition Initial Assessment Completed for this Encounter: Yes Disposition of Patient: Outpatient treatment Type of outpatient treatment: Adult  Amyjo Mizrachi J [place] 05/18/2015 9:07 PM

## 2015-05-18 NOTE — ED Notes (Signed)
Pt reports she has been feeling depressed over the past couple of days. Also reports lower back pain and feeling anxious. Denies any SI/HI.

## 2015-05-18 NOTE — Discharge Instructions (Signed)
Schedule a follow up with [facility] as discussed with your counselor.   Emergency Department Resource Guide 1) Find a Doctor and Pay Out of Pocket Although you won't have to find out who is covered by your insurance plan, it is a good idea to ask around and get recommendations. You will then need to call the office and see if the doctor you have chosen will accept you as a new patient and what types of options they offer for patients who are self-pay. Some doctors offer discounts or will set up payment plans for their patients who do not have insurance, but you will need to ask so you aren't surprised when you get to your appointment.  2) Contact Your Local Health Department Not all health departments have doctors that can see patients for sick visits, but many do, so it is worth a call to see if yours does. If you don't know where your local health department is, you can check in your phone book. The CDC also has a tool to help you locate your state's health department, and many state websites also have listings of all of their local health departments.  3) Find a [facility] If your illness is not likely to be very severe or complicated, you may want to try a walk in clinic. These are popping up all over the country in pharmacies, drugstores, and shopping centers. They're usually staffed by nurse practitioners or physician assistants that have been trained to treat common illnesses and complaints. They're usually fairly quick and inexpensive. However, if you have serious medical issues or chronic medical problems, these are probably not your best option.  No Primary Care Doctor: - Call Health Connect at  [phone] - they can help you locate a primary care doctor that  accepts your insurance, provides certain services, etc. - Physician Referral Service- [phone]  Chronic Pain Problems: Organization         Address  Phone   Notes  [facility]  [phone]  Patients need to be referred by their primary care doctor.   Medication Assistance: Organization         Address  Phone   Notes  [facility] Medication [facility] [place] [address] [place], [place] [address] [phone] --Must be a resident of [facility] -- Must have NO insurance coverage whatsoever (no Medicaid/ Medicare, etc.) -- The pt. MUST have a primary care doctor that directs their care regularly and follows them in the community   MedAssist  [phone]   [employer]  [phone]    Agencies that provide inexpensive medical care: Organization         Address  Phone   Notes  [place]  [phone]   [facility]    [phone]   [facility] [place], [place] [address] [phone]   [place] [address] [place] [phone]   Planned Parenthood    [phone]   [facility]    [phone]   [place] and [address], [place] Phone:  [phone], Fax:  [phone] Hours of Operation:  9 am - 6 pm, M-F.  Also accepts Medicaid/Medicare and self-pay.  [facility] Trust for [place], [address], [place] Phone: [phone], Fax: [phone]. Hours of Operation:  8:30 am -  5:30 pm, M-F.  Also accepts Medicaid and self-pay.  [facility] [place] [address], [place] Phone: [phone]   [place], [place], [place] [phone], Ext. [phone] Mondays & Thursdays: 7-9 AM.  First 15 patients are seen on a first come, first serve basis.    [place] Providers:  Organization         Address  Phone   Notes  [facility] [address], [place] [phone] Also accepts self-pay patients.  [facility] [id number] [place], [place]  [phone]   [place], [address], [place] [phone]   [facility] [address] [place] [phone]   Lucianne Lei [address], [place]   [phone] Only accepts [place] Access [place] patients after they have their name applied to their card.   Self-Pay (no insurance) in [facility]:  Organization         Address  Phone   Notes  Sickle Cell Patients, [facility] [place] [phone]   [facility] [place] [phone]   [facility] [place]  [place], [place],  [phone]   [facility]/Dr. Osei-Bonsu  [address], [place] or [address], [place] [phone] Phone number for both [place] and [place] locations is the same.  [facility] [address] [place] [phone]   [facility] [address] [place] or [address] [phone]   [facility] [address], [place] [phone], phone; [phone], fax Sees patients 1st and 3rd Saturday of every month.  Must not qualify for public or private insurance (i.e. Medicaid, Medicare, [place] Health Choice, Veterans' Benefits)  Household income should be no more than 200% of the poverty level The clinic cannot treat you if you are pregnant or think you are pregnant  Sexually transmitted diseases are not treated at the clinic.    Dental Care: Organization         Address  Phone  Notes  [facility] Department of [facility] [place] [phone] Accepts children up to age 47 who are enrolled in [place] or [place]; pregnant women with a Medicaid card; and children who have applied for Medicaid or [place] Health Choice, but were  declined, whose parents can pay a reduced fee at time of service.  [facility] of [facility]  [address], [place] [phone] Accepts children up to age 4 who are enrolled in [place] or [place]; pregnant women with a Medicaid card; and children who have applied for Medicaid or [place] Health Choice, but were declined, whose parents can pay a reduced fee at time of service.  [facility] Adult [facility] Access PROGRAM  [place] [phone] Patients are seen by appointment only. Walk-ins are not accepted. [place] will see patients 45 years of age and older. Monday - Tuesday (8am-5pm) Most Wednesdays (8:30-5pm) $30 per visit, cash only  [facility] Access PRO[facility]  [address], [place] [phone] Patients are seen by appointment only. Walk-ins are  not accepted. [place] will see patients 44 years of age and older. One Wednesday Evening (Monthly: Volunteer Based).  $30 per visit, cash only  [place]  [phone] for adults; Children under age 9, call Graduate Pediatric Dentistry at [phone]. Children aged 15-14, please call [phone] to request a pediatric application.  Dental services are provided in all areas of dental care including fillings, crowns and bridges, complete and partial dentures, implants, gum treatment, root canals, and extractions. Preventive care is also provided. Treatment is provided to both adults and children. Patients are selected via a lottery and there is often a waiting list.   [facility] [address], [place]  [phone] [email or web address]   [facility] [address] [place], [place] [phone], Ext. [phone] Second and Fourth Thursday of each month, opens at 6:30 AM; Clinic ends at 9 AM.  Patients are seen on a first-come first-served basis, and a limited number are seen during each clinic.    [facility]  [address] Hillard Danker [place], [place] [phone]   Eligibility Requirements You must have lived in [place], [place], or [place] counties for at least the last three months.   You cannot be eligible for state or federal sponsored [employer], including [employer], [place], or [employer].   You generally cannot be eligible for healthcare insurance through your employer.    How to apply: Eligibility screenings are held every Tuesday and Wednesday afternoon from 1:00 pm until 4:00 pm. You do not need an appointment for the interview!  [facility] [address] [place], [place]   [place]  [place] Department  [place]  [phone]    Behavioral Health Resources in the Community: Intensive Outpatient Programs Organization         Address  Phone  Notes  [place]. [address], [place], [place] [phone]   [facility] [address] [place], [place]   ADS: [facility] Drug [facility] [address] [place], [place]   [place] [address],  [place], [place] or [phone]   Substance Abuse Resources Organization         Address  Phone  Notes  Alcohol and Drug Services  [phone]   [place]  [phone]   [facility] [place]   Chinita Pester  [phone]   Residential & Outpatient Substance Abuse Program  [phone]   Psychological Services Organization         Address  Phone  Notes  [facility] [place]  [place]  [phone]   [place] [address], [place] or [phone]    Mobile Crisis Teams Organization         Address  Phone  Notes  [facility], Mobile Crisis Care  Unit  [phone]   [facility]  [address] [place], [place]   [place] Levels [address], [place] [phone]    Self-Help/Support Groups Organization         Address  Phone             Notes  [place]. of [place] - variety of support groups  [place] Call for more information  Narcotics Anonymous (NA),  [facility] [address] [employer] [place]  2 meetings at this location   Residential Facilities manager         Address  Phone  Notes  ASAP Residential Treatment [place],    [place]  [phone]   [facility]  [address] [place] [id number], [place], [place]   [place], [place] [phone] Admissions: 8am-3pm M-F  Incentives Substance [place] [address]    [place], [place] [phone]   [facility] [address] [place], [place], [place]   [facility] [address]  [place], [place]   [facility] Programs - Intensive Outpatient [address] [place]een Mans 1, [place], [place]   [facility] [address]  [place], [place] [phone] or [phone]   [facility] Treatment Services (RTS) [address] [place], [place] Accepts Medicaid  [facility] [place] [address]  [place] [phone] Substance Abuse/Addiction Treatment   [facility] Organization         Address  Phone  Notes  [facility]  [phone]   Domenic Schwab, PhD [address] [place]lis [place], [place]   [phone] or [phone]   [place], [place] [phone]   [facility] [address] [place], [place] [phone] Insurance/Medicaid/sponsorship through [facility] and Families [address] [place]                                     [place], [place] [phone] [place] [address], [place] [phone]    Dr. Adele Schilder  [phone]   [facility] Dept. 1) [address] [place] 2) [place] 3)  [address], [place] [phone]  [phone]   [place] [phone] or [phone] (After Hours)

## 2015-05-18 NOTE — ED Provider Notes (Signed)
History  By signing my name below, I, Marlowe Kays, attest that this documentation has been prepared under the direction and in the presence of Olyn Landstrom, PA-C. Electronically Signed: Marlowe Kays, ED Scribe. 05/18/2015. 9:08 PM  Chief Complaint  Patient presents with  . Depression  . Insomnia   The history is provided by the patient and medical records. No language interpreter was used.    HPI Comments:  Rebecca Richards is a 70 y.o. female with PMHx of chronic back pain, depression, and anxiety who presents to the Emergency Department complaining of depression that began worsening a couple of days ago. She reports feeling depressed for the past few months. Pt reports "pins and needles" all over as in her to "just keep moving all the time", insomnia and fidgeting/anxiety. She states she feels down and has a lack of energy. Pt reports seeing her PCP today, explained her current symptoms and he told her to present here to the ED for psychiatric evaluation. She denies modifying factors and cannot pinpoint anything in particular that is causing her anxiety/depression. She denies thoughts of self-harm, SI, HI, hopelessness, audio or visual hallucinations. Pt is ambulatory without assistance. She states she used to see a psychiatrist she states he was not helping her. She also reports being prescribed anxiety and depression medications but "my doctor stopping giving them to me". She has no other complaints today.   Past Medical History  Diagnosis Date  . Brain tumor [facility])     Surgery for benign brain tumor in the remote past  . Hypertension   . Preop cardiovascular exam     Cardiac clearance for knee surgery, January, 2013  . Edema   . Tobacco abuse   . Ejection fraction     EF 70%, echo, February, 2013  . Sleep apnea     uses Cpap  . Hepatitis 1970's    hep c  . Anxiety   . 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 Nasal congestion   . Diabetes mellitus without complication ([place])   .  Insomnia   . Spinal cord stimulator status     for pain   . Chronic knee pain   . Chronic back pain     with radiculopathy bilat legs  . Depression   . Arthritis   . 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 Abdominal hysterectomy    . Knee surgery    . Nose surgery    . Back surgery      pt has stimulator in lower back and can't have an MRI  . Total knee arthroplasty  07/21/2011    Procedure: TOTAL KNEE ARTHROPLASTY;  Surgeon: Yvette Rack., MD;  Location: [facility];  Service: Orthopedics;  Laterality: Left;  . 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 Cholecystectomy N/A 09/30/2012    Procedure: LAPAROSCOPIC CHOLECYSTECTOMY;  Surgeon: Donato Heinz, MD;  Location: [facility];  Service: General;  Laterality: N/A;   Family History  Problem Relation Age of Onset  . Cancer Mother   . Hypertension Mother   . Anesthesia problems Neg Hx   . Diabetes Mother   . Diabetes Brother    Social History  Substance Use Topics  . Smoking status: Former Smoker -- 0.50 packs/day for 10 years  Types: Cigarettes    Quit date: 12/26/2013  . Smokeless tobacco: Never Used  . Alcohol Use: No     Comment: none since 03-28-2011   OB History    Gravida Para Term Preterm AB TAB SAB Ectopic Multiple Living   1 1 1       1      Review of Systems  Musculoskeletal: Positive for back pain (chronic).  Psychiatric/Behavioral: Positive for sleep disturbance, dysphoric mood and agitation. Negative for suicidal ideas, hallucinations, behavioral problems, confusion and self-injury. The patient is nervous/anxious.   All other systems reviewed and are negative.   Allergies  Review of patient's allergies indicates no known allergies.  Home Medications   Prior to Admission medications   Medication Sig Start Date End Date Taking? Authorizing Provider  ASHWAGANDHA PO Take 1  capsule by mouth daily.    Historical Provider, MD  diphenhydrAMINE (BENADRYL) 25 MG tablet Take 1 tablet (25 mg total) by mouth every 6 (six) hours as needed for itching. Patient not taking: Reported on 05/16/2015 04/04/15   Davonna Belling, MD  gabapentin (NEURONTIN) 100 MG capsule Take 100 mg by mouth 3 (three) times daily. 04/19/15   Historical Provider, MD  hydrochlorothiazide (MICROZIDE) 12.5 MG capsule Take 1 capsule by mouth daily. 04/19/15   Historical Provider, MD  HYDROcodone-acetaminophen (NORCO/VICODIN) 5-325 MG tablet Take 1 tablet by mouth every 6 (six) hours as needed. 05/16/15   Milton Ferguson, MD  hydrOXYzine (ATARAX/VISTARIL) 25 MG tablet Take 1 tablet (25 mg total) by mouth every 4 (four) hours as needed for itching. 05/16/15   [place] Ferguson, MD  ibuprofen (ADVIL,MOTRIN) 200 MG tablet Take 200 mg by mouth every 6 (six) hours as needed for mild pain or moderate pain.    Historical Provider, MD  Insulin Detemir (LEVEMIR FLEXTOUCH) 100 UNIT/ML Pen Inject 20 Units into the skin daily at 10 pm.    Historical Provider, MD  metFORMIN (GLUCOPHAGE) 500 MG tablet Take 500 mg by mouth 2 (two) times daily.    Historical Provider, MD  Omega-3 Fatty Acids (FISH OIL) 1000 MG CAPS Take 1 capsule by mouth daily.    Historical Provider, MD  predniSONE (DELTASONE) 20 MG tablet Take 2 tablets (40 mg total) by mouth daily. Patient not taking: Reported on 05/16/2015 04/05/15   Davonna Belling, MD  telmisartan (MICARDIS) 80 MG tablet Take 80 mg by mouth daily. 04/19/15   Historical Provider, MD   Triage Vitals: BP 130/74 mmHg  Pulse 97  Temp(Src) 97.8 F (36.6 C) (Oral)  Resp 18  Wt 172 lb (78.019 kg)  SpO2 97% Physical Exam  Constitutional: She appears well-developed and well-nourished. No distress.  Nontoxic appearing  HENT:  Head: Normocephalic and atraumatic.  Right Ear: External ear normal.  Left Ear: External ear normal.  Eyes: Conjunctivae are normal. Right eye exhibits no discharge. Left eye  exhibits no discharge. No scleral icterus.  Neck: Normal range of motion.  Cardiovascular: Normal rate and regular rhythm.   Pulmonary/Chest: Effort normal and breath sounds normal. No respiratory distress.  Musculoskeletal: Normal range of motion.  No tenderness over the lumbar spine. Moves all extremities spontaneously walks with a steady gait.  Neurological: She is alert. Coordination normal.  Skin: Skin is warm and dry.  Psychiatric: Her speech is normal. Judgment and thought content normal. Her mood appears anxious. She is hyperactive. Cognition and memory are normal.  Patient appears anxious. Fidgeting and rocking back and forth during interview. Good eye contact. Patient is interactive and answers  questions appropriately. Denies thoughts of self-harm, SI or HI. Denies AVH  Nursing note and vitals reviewed.   ED Course  Procedures (including critical care time) DIAGNOSTIC STUDIES: Oxygen Saturation is 97% on RA, normal by my interpretation.   COORDINATION OF CARE: 8:26 PM- Will order TTS consult. Pt verbalizes understanding and agrees to plan.  Medications  LORazepam (ATIVAN) tablet 1 mg (not administered)    Labs Review Labs Reviewed  COMPREHENSIVE METABOLIC PANEL - Abnormal; Notable for the following:    Glucose, Bld 124 (*)    Creatinine, Ser 1.04 (*)    ALT 12 (*)    Total Bilirubin 0.1 (*)    GFR calc non Af Amer 54 (*)    All other components within normal limits  CBC - Abnormal; Notable for the following:    WBC 11.8 (*)    All other components within normal limits  URINALYSIS, ROUTINE W REFLEX MICROSCOPIC (NOT AT [facility]) - Abnormal; Notable for the following:    APPearance CLOUDY (*)    Hgb urine dipstick TRACE (*)    Protein, ur 30 (*)    All other components within normal limits  URINE MICROSCOPIC-ADD ON - Abnormal; Notable for the following:    Squamous Epithelial / LPF 0-5 (*)    Bacteria, UA FEW (*)    All other components within normal limits  ETHANOL   URINE RAPID DRUG SCREEN, HOSP PERFORMED  ACETAMINOPHEN LEVEL  SALICYLATE LEVEL    Imaging Review No results found. I have personally reviewed and evaluated these images and lab results as part of my medical decision-making.   EKG Interpretation None      MDM   Final diagnoses:  Anxiety  Depression   Patient presenting with worsening depression and anxiety. She reports a history of depression and anxiety for which she states medications for but her primary care provider no longer provides these. She is also complaining of chronic low back pain for which she was evaluated 2 days ago. She reports significant improvement in the pain since receiving a shot of pain medicine in the emergency department 2 days ago. Denies all other complaints at this time. VSS and pt is non-toxic appearing. Benign physical exam and blood work is unremarkable. Patient has been medically cleared in the ED and is appropriate for TTS consultation and their recommendations. Pt is in NAD and stable for holding in [facility].   I personally performed the services described in this documentation, which was scribed in my presence. The recorded information has been reviewed and is accurate.     Lahoma Crocker Chanson Teems, PA-C 05/18/15 2210  Tanna Furry, MD 05/28/15 [phone]

## 2015-06-01 ENCOUNTER — Emergency Department (HOSPITAL_COMMUNITY)
Admission: EM | Admit: 2015-06-01 | Discharge: 2015-06-01 | Disposition: A | Discharge status: Home / Self Care | Payer: Medicare HMO | Attending: Emergency Medicine | Admitting: Emergency Medicine

## 2015-06-01 ENCOUNTER — Encounter (HOSPITAL_COMMUNITY): Payer: Self-pay | Admitting: *Deleted

## 2015-06-01 DIAGNOSIS — M545 Low back pain: Secondary | ICD-10-CM | POA: Diagnosis present

## 2015-06-01 DIAGNOSIS — Z794 Long term (current) use of insulin: Secondary | ICD-10-CM | POA: Insufficient documentation

## 2015-06-01 DIAGNOSIS — M5440 Lumbago with sciatica, unspecified side: Secondary | ICD-10-CM | POA: Diagnosis not present

## 2015-06-01 DIAGNOSIS — G8929 Other chronic pain: Secondary | ICD-10-CM | POA: Diagnosis not present

## 2015-06-01 DIAGNOSIS — F329 Major depressive disorder, single episode, unspecified: Secondary | ICD-10-CM | POA: Insufficient documentation

## 2015-06-01 DIAGNOSIS — Z87891 Personal history of nicotine dependence: Secondary | ICD-10-CM | POA: Insufficient documentation

## 2015-06-01 DIAGNOSIS — Z85841 Personal history of malignant neoplasm of brain: Secondary | ICD-10-CM | POA: Insufficient documentation

## 2015-06-01 DIAGNOSIS — I10 Essential (primary) hypertension: Secondary | ICD-10-CM | POA: Insufficient documentation

## 2015-06-01 DIAGNOSIS — M5442 Lumbago with sciatica, left side: Secondary | ICD-10-CM

## 2015-06-01 DIAGNOSIS — Z791 Long term (current) use of non-steroidal anti-inflammatories (NSAID): Secondary | ICD-10-CM | POA: Diagnosis not present

## 2015-06-01 DIAGNOSIS — M5441 Lumbago with sciatica, right side: Secondary | ICD-10-CM

## 2015-06-01 DIAGNOSIS — Z7984 Long term (current) use of oral hypoglycemic drugs: Secondary | ICD-10-CM | POA: Insufficient documentation

## 2015-06-01 DIAGNOSIS — E119 Type 2 diabetes mellitus without complications: Secondary | ICD-10-CM | POA: Diagnosis not present

## 2015-06-01 DIAGNOSIS — Z79899 Other long term (current) drug therapy: Secondary | ICD-10-CM | POA: Insufficient documentation

## 2015-06-01 DIAGNOSIS — Z85528 Personal history of other malignant neoplasm of kidney: Secondary | ICD-10-CM | POA: Diagnosis not present

## 2015-06-01 DIAGNOSIS — Z90710 Acquired absence of both cervix and uterus: Secondary | ICD-10-CM | POA: Diagnosis not present

## 2015-06-01 DIAGNOSIS — Z9049 Acquired absence of other specified parts of digestive tract: Secondary | ICD-10-CM | POA: Insufficient documentation

## 2015-06-01 LAB — CBG MONITORING, ED: Glucose-Capillary: 131 mg/dL — ABNORMAL HIGH (ref 65–99)

## 2015-06-01 MED ORDER — CYCLOBENZAPRINE HCL 5 MG PO TABS: 5.0000 mg | ORAL_TABLET | Freq: Three times a day (TID) | ORAL | Status: DC | PRN

## 2015-06-01 MED ORDER — OXYCODONE-ACETAMINOPHEN 5-325 MG PO TABS: 1.0000 | ORAL_TABLET | Freq: Four times a day (QID) | ORAL | Status: DC | PRN

## 2015-06-01 MED ORDER — METHOCARBAMOL 500 MG PO TABS
500.0000 mg | ORAL_TABLET | Freq: Once | ORAL | Status: AC
  Administered 2015-06-01: 500 mg via ORAL
  Filled 2015-06-01: qty 1

## 2015-06-01 MED ORDER — OXYCODONE-ACETAMINOPHEN 5-325 MG PO TABS
1.0000 | ORAL_TABLET | Freq: Once | ORAL | Status: AC
  Administered 2015-06-01: 1 via ORAL
  Filled 2015-06-01: qty 1

## 2015-06-01 NOTE — Discharge Instructions (Signed)

## 2015-06-01 NOTE — ED Notes (Signed)
Pt reports chronic back pain and states her feet started "burning" and now feel like "walking on glass". Pt is diabetic.

## 2015-06-01 NOTE — ED Notes (Signed)
Pt c/o lower back pain and states her feet feel like she is walking on glass

## 2015-06-04 NOTE — ED Provider Notes (Signed)
CSN: [id number]     Arrival date & time 06/01/15  2106 History   First MD Initiated Contact with Patient 06/01/15 2121     Chief Complaint  Patient presents with  . Back Pain     (Consider location/radiation/quality/duration/timing/severity/associated sxs/prior Treatment) HPI   Rebecca Richards is a 70 y.o. female who presents to the Emergency Department complaining of worsening of her chronic low back pain.  Symptoms worsening for several days.  She describes a sharp stabbing pain across her lower back with burning, stinging pain to both lower legs and feet.  She states that is feels like "walking on glass" pain is constant and worsening with walking and standing.  She states that her PMD has her taking gabapentin, but medication not helping.  She denies recent injury, fever, chills, abd pain, urine or bowel changes, numbness or weakness of her LE's.   Past Medical History  Diagnosis Date  . Brain tumor [facility])     Surgery for benign brain tumor in the remote past  . Hypertension   . Preop cardiovascular exam     Cardiac clearance for knee surgery, January, 2013  . Edema   . Tobacco abuse   . Ejection fraction     EF 70%, echo, February, 2013  . Sleep apnea     uses Cpap  . Hepatitis 1970's    hep c  . Anxiety   . 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 Nasal congestion   . Diabetes mellitus without complication ([place])   . Insomnia   . Spinal cord stimulator status     for pain   . Chronic knee pain   . Chronic back pain     with radiculopathy bilat legs  . Depression   . Arthritis   . 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 Abdominal hysterectomy    . Knee surgery    . Nose surgery    . Back surgery      pt has stimulator in lower back and can't have an MRI  . Total knee arthroplasty  07/21/2011    Procedure: TOTAL KNEE ARTHROPLASTY;   Surgeon: Yvette Rack., MD;  Location: [place];  Service: Orthopedics;  Laterality: Left;  . 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 Cholecystectomy N/A 09/30/2012    Procedure: LAPAROSCOPIC CHOLECYSTECTOMY;  Surgeon: Donato Heinz, MD;  Location: [facility];  Service: General;  Laterality: N/A;   Family History  Problem Relation Age of Onset  . Cancer Mother   . Hypertension Mother   . Anesthesia problems Neg Hx   . Diabetes Mother   . Diabetes Brother    Social History  Substance Use Topics  . Smoking status: Former Smoker -- 0.50 packs/day for 10 years    Types: Cigarettes    Quit date: 12/26/2013  . Smokeless tobacco: Never Used  . Alcohol Use: No     Comment: none since 03-28-2011   OB History    Gravida Para Term Preterm AB TAB SAB Ectopic Multiple Living   1 1 1       1      Review of Systems  Constitutional: Negative for fever.  Respiratory: Negative for shortness of breath.   Gastrointestinal: Negative for vomiting, abdominal pain and constipation.  Genitourinary: Negative for dysuria, hematuria, flank pain, decreased urine volume and difficulty urinating.  Musculoskeletal: Positive for back pain. Negative for joint swelling.  Skin: Negative for rash.  Neurological: Negative for weakness and numbness.  All other systems reviewed and are negative.     Allergies  Review of patient's allergies indicates no known allergies.  Home Medications   Prior to Admission medications   Medication Sig Start Date End Date Taking? Authorizing Provider  ASHWAGANDHA PO Take 1 capsule by mouth daily.    Historical Provider, MD  cyclobenzaprine (FLEXERIL) 5 MG tablet Take 1 tablet (5 mg total) by mouth 3 (three) times daily as needed. 06/01/15   Alexandar Weisenberger, PA-C  diphenhydrAMINE (BENADRYL) 25 MG tablet Take 1 tablet (25 mg total) by mouth every 6 (six) hours as needed for itching. Patient not taking: Reported on 05/16/2015 04/04/15   Davonna Belling, MD  gabapentin (NEURONTIN) 100  MG capsule Take 100 mg by mouth 3 (three) times daily. 04/19/15   Historical Provider, MD  hydrochlorothiazide (MICROZIDE) 12.5 MG capsule Take 1 capsule by mouth daily. 04/19/15   Historical Provider, MD  HYDROcodone-acetaminophen (NORCO/VICODIN) 5-325 MG tablet Take 1 tablet by mouth every 6 (six) hours as needed. 05/16/15   Milton Ferguson, MD  hydrOXYzine (ATARAX/VISTARIL) 25 MG tablet Take 1 tablet (25 mg total) by mouth every 4 (four) hours as needed for itching. 05/16/15   Milton Ferguson, MD  ibuprofen (ADVIL,MOTRIN) 200 MG tablet Take 200 mg by mouth every 6 (six) hours as needed for mild pain or moderate pain.    Historical Provider, MD  Insulin Detemir (LEVEMIR FLEXTOUCH) 100 UNIT/ML Pen Inject 20 Units into the skin daily at 10 pm.    Historical Provider, MD  metFORMIN (GLUCOPHAGE) 500 MG tablet Take 500 mg by mouth 2 (two) times daily.    Historical Provider, MD  Omega-3 Fatty Acids (FISH OIL) 1000 MG CAPS Take 1 capsule by mouth daily.    Historical Provider, MD  oxyCODONE-acetaminophen (PERCOCET/ROXICET) 5-325 MG tablet Take 1 tablet by mouth every 6 (six) hours as needed. 06/01/15   Joya Willmott, PA-C  predniSONE (DELTASONE) 20 MG tablet Take 2 tablets (40 mg total) by mouth daily. Patient not taking: Reported on 05/16/2015 04/05/15   Davonna Belling, MD  telmisartan (MICARDIS) 80 MG tablet Take 80 mg by mouth daily. 04/19/15   Historical Provider, MD   BP 165/89 mmHg  Pulse 82  Temp(Src) 97.5 F (36.4 C) (Temporal)  Resp 18  Ht 5\' 1"  (1.549 m)  Wt 73.936 kg  BMI 30.81 kg/m2  SpO2 100% Physical Exam  Constitutional: She is oriented to person, place, and time. She appears well-developed and well-nourished. No distress.  HENT:  Head: Normocephalic and atraumatic.  Neck: Normal range of motion. Neck supple.  Cardiovascular: Normal rate, regular rhythm, normal heart sounds and intact distal pulses.   No murmur heard. Pulmonary/Chest: Effort normal and breath sounds normal. No  respiratory distress.  Abdominal: Soft. She exhibits no distension. There is no tenderness.  Musculoskeletal: She exhibits tenderness. She exhibits no edema.       Lumbar back: She exhibits tenderness and pain. She exhibits normal range of motion, no swelling, no deformity, no laceration and normal pulse.  Diffuse ttp of the lower lumbar spine and bilateral lumbar paraspinal muscles and SI joints.    DP pulses are brisk and symmetrical.  Distal sensation intact.  Pt has 5/5 strength against resistance of bilateral lower extremities.     Neurological: She is alert  and oriented to person, place, and time. She has normal strength. No sensory deficit. She exhibits normal muscle tone. Coordination and gait normal.  Reflex Scores:      Patellar reflexes are 2+ on the right side and 2+ on the left side.      Achilles reflexes are 2+ on the right side and 2+ on the left side. Skin: Skin is warm and dry. No rash noted.  Nursing note and vitals reviewed.   ED Course  Procedures (including critical care time) Labs Review Labs Reviewed  CBG MONITORING, ED - Abnormal; Notable for the following:    Glucose-Capillary 131 (*)    All other components within normal limits    Imaging Review No results found. I have personally reviewed and evaluated these images and lab results as part of my medical decision-making.   EKG Interpretation None      MDM   Final diagnoses:  Bilateral low back pain with sciatica, sciatica laterality unspecified    Pt with acute on chronic low back pain.  Ambulates with a steady gait.  No concerning sx's for emergent neurological process.  Hx of peripheral neuropathy likely secondary to DM, currently taking gabapentin.  She appears stable for d/c and advised that she needs to arrange PMD f/u for further management.      Kem Parkinson, PA-C 06/04/15 [place], MD 06/04/15 1355

## 2015-06-06 ENCOUNTER — Encounter (HOSPITAL_COMMUNITY): Payer: Self-pay | Admitting: Emergency Medicine

## 2015-06-06 ENCOUNTER — Emergency Department (HOSPITAL_COMMUNITY)
Admission: EM | Admit: 2015-06-06 | Discharge: 2015-06-06 | Disposition: A | Discharge status: Home / Self Care | Payer: Medicare HMO | Attending: Emergency Medicine | Admitting: Emergency Medicine

## 2015-06-06 DIAGNOSIS — F329 Major depressive disorder, single episode, unspecified: Secondary | ICD-10-CM | POA: Diagnosis not present

## 2015-06-06 DIAGNOSIS — Z760 Encounter for issue of repeat prescription: Secondary | ICD-10-CM | POA: Diagnosis present

## 2015-06-06 DIAGNOSIS — Z79899 Other long term (current) drug therapy: Secondary | ICD-10-CM | POA: Insufficient documentation

## 2015-06-06 DIAGNOSIS — I10 Essential (primary) hypertension: Secondary | ICD-10-CM | POA: Diagnosis not present

## 2015-06-06 DIAGNOSIS — E1349 Other specified diabetes mellitus with other diabetic neurological complication: Secondary | ICD-10-CM

## 2015-06-06 DIAGNOSIS — E1140 Type 2 diabetes mellitus with diabetic neuropathy, unspecified: Secondary | ICD-10-CM | POA: Insufficient documentation

## 2015-06-06 DIAGNOSIS — Z794 Long term (current) use of insulin: Secondary | ICD-10-CM | POA: Diagnosis not present

## 2015-06-06 DIAGNOSIS — Z87891 Personal history of nicotine dependence: Secondary | ICD-10-CM | POA: Diagnosis not present

## 2015-06-06 LAB — CBG MONITORING, ED: Glucose-Capillary: 110 mg/dL — ABNORMAL HIGH (ref 65–99)

## 2015-06-06 MED ORDER — GABAPENTIN 100 MG PO CAPS: 100.0000 mg | ORAL_CAPSULE | Freq: Three times a day (TID) | ORAL | Status: DC

## 2015-06-06 MED ORDER — HYDROMORPHONE HCL 1 MG/ML IJ SOLN
1.0000 mg | Freq: Once | INTRAMUSCULAR | Status: AC
  Administered 2015-06-06: 1 mg via INTRAMUSCULAR
  Filled 2015-06-06: qty 1

## 2015-06-06 MED ORDER — PROMETHAZINE HCL 12.5 MG PO TABS
12.5000 mg | ORAL_TABLET | Freq: Once | ORAL | Status: AC
  Administered 2015-06-06: 12.5 mg via ORAL
  Filled 2015-06-06: qty 1

## 2015-06-06 NOTE — ED Provider Notes (Signed)
CSN: [id number]     Arrival date & time 06/06/15  1225 History  By signing my name below, I, Stephania Fragmin, attest that this documentation has been prepared under the direction and in the presence of Lily Kocher, PA-C. Electronically Signed: Stephania Fragmin, ED Scribe. 06/06/2015. 2:47 PM.    Chief Complaint  Patient presents with  . Medication Refill   The history is provided by the patient. No language interpreter was used.    HPI Comments: Rebecca Richards is a 70 y.o. female with a history of DM and neuropathy, who presents to the Emergency Department for a medication refill. Patient had seen her PCP yesterday after running out of Neurontin 3 days ago. She states the Neurontin had been helping, but her PCP had switched her to Lyrica because she had some stomach pain at the time, and her PCP thought it might be due to the Neurontin. However, patient states the Lyrica has not been effective after taking 3 doses yesterday, with continuing neuropathic pain. She denies any new neurological symptoms. She states she treats her DM with both pills and insulin. She states she had was diagnosed with neuropathy 1 year ago. Patient states she is not driving today.   Past Medical History  Diagnosis Date  . Brain tumor [facility])     Surgery for benign brain tumor in the remote past  . Hypertension   . Preop cardiovascular exam     Cardiac clearance for knee surgery, January, 2013  . Edema   . Tobacco abuse   . Ejection fraction     EF 70%, echo, February, 2013  . Sleep apnea     uses Cpap  . Hepatitis 1970's    hep c  . Anxiety   . 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 Nasal congestion   . Diabetes mellitus without complication ([place])   . Insomnia   . Spinal cord stimulator status     for pain   . Chronic knee pain   . Chronic back pain     with radiculopathy bilat legs  . Depression   . Arthritis   . 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 Renal disorder     was diagnosed with kidney CA but tumor shrinked  without intervention   Past Surgical History  Procedure Laterality Date  . Abdominal hysterectomy    . Knee surgery    . Nose surgery    . Back surgery      pt has stimulator in lower back and can't have an MRI  . Total knee arthroplasty  07/21/2011    Procedure: TOTAL KNEE ARTHROPLASTY;  Surgeon: Yvette Rack., MD;  Location: [place];  Service: Orthopedics;  Laterality: Left;  . 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 Cholecystectomy N/A 09/30/2012    Procedure: LAPAROSCOPIC CHOLECYSTECTOMY;  Surgeon: Donato Heinz, MD;  Location: [facility];  Service: General;  Laterality: N/A;   Family History  Problem Relation Age of Onset  . Cancer Mother   . Hypertension Mother   . Anesthesia problems Neg Hx   . Diabetes Mother   . Diabetes Brother    Social History  Substance Use Topics  . Smoking status: Former Smoker -- 0.50 packs/day for 10 years    Types: Cigarettes    Quit date: 12/26/2013  . Smokeless tobacco: Never Used  . Alcohol Use: No     Comment: none since 03-28-2011  OB History    Gravida Para Term Preterm AB TAB SAB Ectopic Multiple Living   1 1 1       1      Review of Systems  Neurological: Negative.   All other systems reviewed and are negative.  Allergies  Review of patient's allergies indicates no known allergies.  Home Medications   Prior to Admission medications   Medication Sig Start Date End Date Taking? Authorizing Provider  ASHWAGANDHA PO Take 1 capsule by mouth daily.    Historical Provider, MD  cyclobenzaprine (FLEXERIL) 5 MG tablet Take 1 tablet (5 mg total) by mouth 3 (three) times daily as needed. 06/01/15   Tammy Triplett, PA-C  diphenhydrAMINE (BENADRYL) 25 MG tablet Take 1 tablet (25 mg total) by mouth every 6 (six) hours as needed for itching. Patient not taking: Reported on 05/16/2015 04/04/15   Davonna Belling, MD  gabapentin (NEURONTIN) 100 MG capsule Take 100 mg by mouth 3 (three) times daily. 04/19/15   Historical Provider, MD   hydrochlorothiazide (MICROZIDE) 12.5 MG capsule Take 1 capsule by mouth daily. 04/19/15   Historical Provider, MD  HYDROcodone-acetaminophen (NORCO/VICODIN) 5-325 MG tablet Take 1 tablet by mouth every 6 (six) hours as needed. 05/16/15   Milton Ferguson, MD  hydrOXYzine (ATARAX/VISTARIL) 25 MG tablet Take 1 tablet (25 mg total) by mouth every 4 (four) hours as needed for itching. 05/16/15   Milton Ferguson, MD  ibuprofen (ADVIL,MOTRIN) 200 MG tablet Take 200 mg by mouth every 6 (six) hours as needed for mild pain or moderate pain.    Historical Provider, MD  Insulin Detemir (LEVEMIR FLEXTOUCH) 100 UNIT/ML Pen Inject 20 Units into the skin daily at 10 pm.    Historical Provider, MD  metFORMIN (GLUCOPHAGE) 500 MG tablet Take 500 mg by mouth 2 (two) times daily.    Historical Provider, MD  Omega-3 Fatty Acids (FISH OIL) 1000 MG CAPS Take 1 capsule by mouth daily.    Historical Provider, MD  oxyCODONE-acetaminophen (PERCOCET/ROXICET) 5-325 MG tablet Take 1 tablet by mouth every 6 (six) hours as needed. 06/01/15   Tammy Triplett, PA-C  predniSONE (DELTASONE) 20 MG tablet Take 2 tablets (40 mg total) by mouth daily. Patient not taking: Reported on 05/16/2015 04/05/15   Davonna Belling, MD  telmisartan (MICARDIS) 80 MG tablet Take 80 mg by mouth daily. 04/19/15   Historical Provider, MD   BP 132/66 mmHg  Pulse 65  Temp(Src) 97.5 F (36.4 C) (Oral)  Resp 16  Wt 163 lb (73.936 kg)  SpO2 98% Physical Exam  Constitutional: She is oriented to person, place, and time. She appears well-developed and well-nourished. No distress.  HENT:  Head: Normocephalic and atraumatic.  Eyes: Conjunctivae and EOM are normal.  Neck: Neck supple. No tracheal deviation present.  Cardiovascular: Normal rate, regular rhythm and normal heart sounds.  Exam reveals no gallop and no friction rub.   No murmur heard. Normal cardiac exam.  Pulmonary/Chest: Effort normal and breath sounds normal. No respiratory distress. She has no  wheezes. She has no rales. She exhibits no tenderness.  Lungs are clear to auscultation bilaterally.  Musculoskeletal: Normal range of motion. She exhibits no edema.  There is no edema of the lower extremities. There are no temperature changes.   Neurological: She is alert and oriented to person, place, and time.  No sensory deficits. Gait is steady. No foot drop.   Skin: Skin is warm and dry.  Psychiatric: She has a normal mood and affect. Her behavior is normal.  Nursing note and vitals reviewed.   ED Course  Procedures (including critical care time)  DIAGNOSTIC STUDIES: Oxygen Saturation is 98% on RA, normal by my interpretation.    COORDINATION OF CARE: 2:23 PM - Discussed treatment plan with pt at bedside which includes short-term course of gabapentin. Will also administer Dilaudid injection. Pt verbalized understanding and agreed to plan.   MDM  Vital signs stable. Exam favors neuropathy pain. Pain improved almost immediately after IM narcotic injection. Rx for 10 Neurontin given to the patient. Pt to see Dr Criss Rosales tomorrow or this week for assistance with pain management.   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 The recorded information has been reviewed and is accurate.*   I have reviewed nursing notes, vital signs, and all appropriate lab and imaging results for this patient.  Lily Kocher, PA-C 06/06/15 1618  Merrily Pew, MD 06/09/15 [phone]

## 2015-06-06 NOTE — Discharge Instructions (Signed)

## 2015-06-06 NOTE — ED Notes (Signed)
Patient has neuropathy, per patient ran out of Neurontin on Thursday. Per patient seen PCP yesterday but was given Lyrica. Per patient Lyrica instead. Patient states medication not helping her and makes her "feel a little crazy" after taking three doses yesterday.

## 2015-06-11 ENCOUNTER — Encounter (HOSPITAL_COMMUNITY): Payer: Self-pay | Admitting: *Deleted

## 2015-06-11 ENCOUNTER — Emergency Department (HOSPITAL_COMMUNITY)
Admission: EM | Admit: 2015-06-11 | Discharge: 2015-06-11 | Disposition: A | Discharge status: Home / Self Care | Payer: Medicare HMO | Attending: Emergency Medicine | Admitting: Emergency Medicine

## 2015-06-11 DIAGNOSIS — Z7984 Long term (current) use of oral hypoglycemic drugs: Secondary | ICD-10-CM | POA: Diagnosis not present

## 2015-06-11 DIAGNOSIS — M792 Neuralgia and neuritis, unspecified: Secondary | ICD-10-CM

## 2015-06-11 DIAGNOSIS — Z794 Long term (current) use of insulin: Secondary | ICD-10-CM | POA: Diagnosis not present

## 2015-06-11 DIAGNOSIS — Z87891 Personal history of nicotine dependence: Secondary | ICD-10-CM | POA: Insufficient documentation

## 2015-06-11 DIAGNOSIS — E1142 Type 2 diabetes mellitus with diabetic polyneuropathy: Secondary | ICD-10-CM | POA: Diagnosis not present

## 2015-06-11 DIAGNOSIS — I10 Essential (primary) hypertension: Secondary | ICD-10-CM | POA: Diagnosis not present

## 2015-06-11 DIAGNOSIS — F329 Major depressive disorder, single episode, unspecified: Secondary | ICD-10-CM | POA: Diagnosis not present

## 2015-06-11 DIAGNOSIS — Z85528 Personal history of other malignant neoplasm of kidney: Secondary | ICD-10-CM | POA: Insufficient documentation

## 2015-06-11 DIAGNOSIS — M79673 Pain in unspecified foot: Secondary | ICD-10-CM | POA: Diagnosis present

## 2015-06-11 MED ORDER — OXYCODONE-ACETAMINOPHEN 5-325 MG PO TABS
1.0000 | ORAL_TABLET | Freq: Once | ORAL | Status: AC
  Administered 2015-06-11: 1 via ORAL
  Filled 2015-06-11: qty 1

## 2015-06-11 MED ORDER — OXYCODONE-ACETAMINOPHEN 5-325 MG PO TABS: 1.0000 | ORAL_TABLET | ORAL | Status: DC | PRN

## 2015-06-11 NOTE — ED Notes (Signed)
Patient has neuropathy, seen here recently for same. Out of pain meds and neurontin.

## 2015-06-11 NOTE — Discharge Instructions (Signed)
Neuropathic Pain Neuropathic pain is pain caused by damage to the nerves that are responsible for certain sensations in your body (sensory nerves). The pain can be caused by damage to:   The sensory nerves that send signals to your spinal cord and brain (peripheral nervous system).  The sensory nerves in your brain or spinal cord (central nervous system). Neuropathic pain can make you more sensitive to pain. What would be a minor sensation for most people may feel very painful if you have neuropathic pain. This is usually a long-term condition that can be difficult to treat. The type of pain can differ from person to person. It may start suddenly (acute), or it may develop slowly and last for a long time (chronic). Neuropathic pain may come and go as damaged nerves heal or may stay at the same level for years. It often causes emotional distress, loss of sleep, and a lower quality of life. CAUSES  The most common cause of damage to a sensory nerve is diabetes. Many other diseases and conditions can also cause neuropathic pain. Causes of neuropathic pain can be classified as:  Toxic. Many drugs and chemicals can cause toxic damage. The most common cause of toxic neuropathic pain is damage from drug treatment for cancer (chemotherapy).  Metabolic. This type of pain can happen when a disease causes imbalances that damage nerves. Diabetes is the most common of these diseases. Vitamin B deficiency caused by long-term alcohol abuse is another common cause.  Traumatic. Any injury that cuts, crushes, or stretches a nerve can cause damage and pain. A common example is feeling pain after losing an arm or leg (phantom limb pain).  Compression-related. If a sensory nerve gets trapped or compressed for a long period of time, the blood supply to the nerve can be cut off.  Vascular. Many blood vessel diseases can cause neuropathic pain by decreasing blood supply and oxygen to nerves.  Autoimmune. This type of  pain results from diseases in which the body's defense system mistakenly attacks sensory nerves. Examples of autoimmune diseases that can cause neuropathic pain include lupus and multiple sclerosis.  Infectious. Many types of viral infections can damage sensory nerves and cause pain. Shingles infection is a common cause of this type of pain.  Inherited. Neuropathic pain can be a symptom of many diseases that are passed down through families (genetic). SIGNS AND SYMPTOMS  The main symptom is pain. Neuropathic pain is often described as:  Burning.  Shock-like.  Stinging.  Hot or cold.  Itching. DIAGNOSIS  No single test can diagnose neuropathic pain. Your health care provider will do a physical exam and ask you about your pain. You may use a pain scale to describe how bad your pain is. You may also have tests to see if you have a high sensitivity to pain and to help find the cause and location of any sensory nerve damage. These tests may include:  Imaging studies, such as:  X-rays.  CT scan.  MRI.  Nerve conduction studies to test how well nerve signals travel through your sensory nerves (electrodiagnostic testing).  Stimulating your sensory nerves through electrodes on your skin and measuring the response in your spinal cord and brain (somatosensory evoked potentials). TREATMENT  Treatment for neuropathic pain may change over time. You may need to try different treatment options or a combination of treatments. Some options include:  Over-the-counter pain relievers.  Prescription medicines. Some medicines used to treat other conditions may also help neuropathic pain. These  include medicines to:  Control seizures (anticonvulsants).  Relieve depression (antidepressants).  Prescription-strength pain relievers (narcotics). These are usually used when other pain relievers do not help.  Transcutaneous nerve stimulation (TENS). This uses electrical currents to block painful nerve  signals. The treatment is painless.  Topical and local anesthetics. These are medicines that numb the nerves. They can be injected as a nerve block or applied to the skin.  Alternative treatments, such as:  Acupuncture.  Meditation.  Massage.  Physical therapy.  Pain management programs.  Counseling. HOME CARE INSTRUCTIONS  Learn as much as you can about your condition.  Take medicines only as directed by your health care provider.  Work closely with all your health care providers to find what works best for you.  Have a good support system at home.  Consider joining a chronic pain support group. SEEK MEDICAL CARE IF:  Your pain treatments are not helping.  You are having side effects from your medicines.  You are struggling with fatigue, mood changes, depression, or anxiety.   This information is not intended to replace advice given to you by your health care provider. Make sure you discuss any questions you have with your health care provider.   Document Released: 12/09/2003 Document Revised: 04/03/2014 Document Reviewed: 08/21/2013 Elsevier Interactive Patient Education 2016 [place] may take the oxycodone prescribed for pain relief.  This will make you drowsy - do not drive within 4 hours of taking this medication.

## 2015-06-12 NOTE — ED Provider Notes (Signed)
CSN: [id number]     Arrival date & time 06/11/15  1711 History   First MD Initiated Contact with Patient 06/11/15 1930     Chief Complaint  Patient presents with  . Foot Pain     (Consider location/radiation/quality/duration/timing/severity/associated sxs/prior Treatment) The history is provided by the patient.   Rebecca Richards is a 70 y.o. female with a history of diabetes and resultant peripheral neuropathy in her feet presenting with worsened uncontrolled pain since she was switched from neurontin to lyrica by her pcp 5 days ago.  She reports having stomach upset/nausea and her pcp felt this may be a side effect of the neurontin, hence the switch.  Pt reports she feels like she is walking on broken glass, sharp stabbing pain with ambulation which is not being relieved by the lyrica.  She was unable to contact her pcp to inform of her worsened neuropathic pain. She is scheduled with her for a f/u appt the first week of April.  She denies any new injury. Denies redness, swelling, rash, skin changes.      Past Medical History  Diagnosis Date  . Brain tumor [facility])     Surgery for benign brain tumor in the remote past  . Hypertension   . Preop cardiovascular exam     Cardiac clearance for knee surgery, January, 2013  . Edema   . Tobacco abuse   . Ejection fraction     EF 70%, echo, February, 2013  . Sleep apnea     uses Cpap  . Hepatitis 1970's    hep c  . Anxiety   . 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 Nasal congestion   . Diabetes mellitus without complication ([place])   . Insomnia   . Spinal cord stimulator status     for pain   . Chronic knee pain   . Chronic back pain     with radiculopathy bilat legs  . Depression   . Arthritis   . 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 Abdominal hysterectomy    . Knee surgery    . Nose surgery    .  Back surgery      pt has stimulator in lower back and can't have an MRI  . Total knee arthroplasty  07/21/2011    Procedure: TOTAL KNEE ARTHROPLASTY;  Surgeon: Yvette Rack., MD;  Location: [facility];  Service: Orthopedics;  Laterality: Left;  . 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 Cholecystectomy N/A 09/30/2012    Procedure: LAPAROSCOPIC CHOLECYSTECTOMY;  Surgeon: Donato Heinz, MD;  Location: [facility];  Service: General;  Laterality: N/A;   Family History  Problem Relation Age of Onset  . Cancer Mother   . Hypertension Mother   . Anesthesia problems Neg Hx   . Diabetes Mother   . Diabetes Brother    Social History  Substance Use Topics  . Smoking status: Former Smoker -- 0.50 packs/day for 10 years    Types: Cigarettes    Quit date: 12/26/2013  . Smokeless tobacco: Never Used  . Alcohol Use: No     Comment: none since 03-28-2011   OB History    Gravida Para Term Preterm AB TAB SAB Ectopic Multiple Living   1 1 1  1     Review of Systems  Constitutional: Negative for fever.  Musculoskeletal: Positive for arthralgias. Negative for myalgias and joint swelling.  Skin: Negative for color change and wound.  Neurological: Negative for weakness and numbness.      Allergies  Review of patient's allergies indicates no known allergies.  Home Medications   Prior to Admission medications   Medication Sig Start Date End Date Taking? Authorizing Provider  ASHWAGANDHA PO Take 1 capsule by mouth daily.    Historical Provider, MD  cyclobenzaprine (FLEXERIL) 5 MG tablet Take 1 tablet (5 mg total) by mouth 3 (three) times daily as needed. Patient taking differently: Take 5 mg by mouth 3 (three) times daily as needed for muscle spasms.  06/01/15   Tammy Triplett, PA-C  diphenhydrAMINE (BENADRYL) 25 MG tablet Take 1 tablet (25 mg total) by mouth every 6 (six) hours as needed for itching. Patient not taking: Reported on 05/16/2015 04/04/15   Davonna Belling, MD  gabapentin (NEURONTIN)  100 MG capsule Take 1 capsule (100 mg total) by mouth 3 (three) times daily. 06/06/15   Lily Kocher, PA-C  hydrochlorothiazide (MICROZIDE) 12.5 MG capsule Take 1 capsule by mouth daily. 04/19/15   Historical Provider, MD  HYDROcodone-acetaminophen (NORCO/VICODIN) 5-325 MG tablet Take 1 tablet by mouth every 6 (six) hours as needed. Patient not taking: Reported on 06/06/2015 05/16/15   Milton Ferguson, MD  hydrOXYzine (ATARAX/VISTARIL) 25 MG tablet Take 1 tablet (25 mg total) by mouth every 4 (four) hours as needed for itching. 05/16/15   Milton Ferguson, MD  ibuprofen (ADVIL,MOTRIN) 200 MG tablet Take 200 mg by mouth every 6 (six) hours as needed for mild pain or moderate pain.    Historical Provider, MD  Insulin Detemir (LEVEMIR FLEXTOUCH) 100 UNIT/ML Pen Inject 20 Units into the skin daily at 10 pm.    Historical Provider, MD  metFORMIN (GLUCOPHAGE) 500 MG tablet Take 500 mg by mouth 2 (two) times daily.    Historical Provider, MD  Omega-3 Fatty Acids (FISH OIL) 1000 MG CAPS Take 1 capsule by mouth daily.    Historical Provider, MD  oxyCODONE-acetaminophen (PERCOCET/ROXICET) 5-325 MG tablet Take 1 tablet by mouth every 4 (four) hours as needed for severe pain. 06/11/15   Evalee Jefferson, PA-C  predniSONE (DELTASONE) 20 MG tablet Take 2 tablets (40 mg total) by mouth daily. Patient not taking: Reported on 05/16/2015 04/05/15   Davonna Belling, MD  PRESCRIPTION MEDICATION Take 1 capsule by mouth 3 (three) times daily. Lyrica - white capsules    Historical Provider, MD  telmisartan (MICARDIS) 80 MG tablet Take 80 mg by mouth daily. 04/19/15   Historical Provider, MD   BP 112/89 mmHg  Pulse 84  Temp(Src) 97.4 F (36.3 C) (Tympanic)  Resp 18  Ht 5\' 1"  (1.549 m)  Wt 73.936 kg  BMI 30.81 kg/m2  SpO2 100% Physical Exam  Constitutional: She is oriented to person, place, and time. She appears well-developed and well-nourished. No distress.  Appears uncomfortable  HENT:  Head: Atraumatic.  Neck: Normal range of  motion.  Cardiovascular:  Pulses:      Dorsalis pedis pulses are 2+ on the right side, and 2+ on the left side.  Pulses equal bilaterally  Neurological: She is alert and oriented to person, place, and time. She has normal strength. No sensory deficit.  Skin: Skin is warm and dry.  ttp to even light touch bilateral feet and ankles.  Skin intact. No redness, swelling or evidence for trauma or infection.  Psychiatric: She has a normal mood and affect.    ED Course  Procedures (including critical care time) Labs Review Labs Reviewed - No data to display  Imaging Review No results found. I have personally reviewed and evaluated these images and lab results as part of my medical decision-making.   EKG Interpretation None      MDM   Final diagnoses:  Neuropathic pain    Pt with history of diabetic neuropathy.  She was given a small quantity of oxycodone to help her over the weekend.  Discussed contacting her pcp by phone on Monday to inquire about switching back to neurontin or alternative plan for better pain control.  Pt advised I don't feel comfortable switching her back to the neurontin - this needs to be a decision discussed with her pcp.  Pt understands plan.     Evalee Jefferson, PA-C 06/12/15 1112  Elnora Morrison, MD 06/12/15 [phone]

## 2015-06-14 ENCOUNTER — Telehealth: Payer: Self-pay | Admitting: Orthopaedic Surgery

## 2015-06-14 NOTE — Telephone Encounter (Signed)
Patient requests refill on Norco 7.5-325 mgs.  Qty  120   Last filled on 05-07-15

## 2015-06-16 ENCOUNTER — Telehealth: Payer: Self-pay | Admitting: Orthopaedic Surgery

## 2015-06-16 NOTE — Telephone Encounter (Signed)
She got oxycodone from the ER on the 17th of March.  She needs to wait until Thursday of this week to be able to get Rx from me.

## 2015-06-17 ENCOUNTER — Emergency Department (HOSPITAL_COMMUNITY)
Admission: EM | Admit: 2015-06-17 | Discharge: 2015-06-17 | Disposition: A | Discharge status: Home / Self Care | Payer: Medicare HMO | Attending: Emergency Medicine | Admitting: Emergency Medicine

## 2015-06-17 ENCOUNTER — Encounter (HOSPITAL_COMMUNITY): Payer: Self-pay

## 2015-06-17 ENCOUNTER — Telehealth: Payer: Self-pay | Admitting: Orthopaedic Surgery

## 2015-06-17 DIAGNOSIS — G8929 Other chronic pain: Secondary | ICD-10-CM | POA: Diagnosis not present

## 2015-06-17 DIAGNOSIS — Z87891 Personal history of nicotine dependence: Secondary | ICD-10-CM | POA: Diagnosis not present

## 2015-06-17 DIAGNOSIS — Z7984 Long term (current) use of oral hypoglycemic drugs: Secondary | ICD-10-CM | POA: Diagnosis not present

## 2015-06-17 DIAGNOSIS — Z794 Long term (current) use of insulin: Secondary | ICD-10-CM | POA: Diagnosis not present

## 2015-06-17 DIAGNOSIS — Z79899 Other long term (current) drug therapy: Secondary | ICD-10-CM | POA: Insufficient documentation

## 2015-06-17 DIAGNOSIS — E1140 Type 2 diabetes mellitus with diabetic neuropathy, unspecified: Secondary | ICD-10-CM | POA: Insufficient documentation

## 2015-06-17 DIAGNOSIS — F329 Major depressive disorder, single episode, unspecified: Secondary | ICD-10-CM | POA: Diagnosis not present

## 2015-06-17 DIAGNOSIS — M79604 Pain in right leg: Secondary | ICD-10-CM | POA: Diagnosis present

## 2015-06-17 DIAGNOSIS — I10 Essential (primary) hypertension: Secondary | ICD-10-CM | POA: Insufficient documentation

## 2015-06-17 MED ORDER — OXYCODONE-ACETAMINOPHEN 5-325 MG PO TABS: 1.0000 | ORAL_TABLET | Freq: Four times a day (QID) | ORAL | Status: DC | PRN

## 2015-06-17 MED ORDER — OXYCODONE-ACETAMINOPHEN 5-325 MG PO TABS
2.0000 | ORAL_TABLET | Freq: Once | ORAL | Status: AC
  Administered 2015-06-17: 2 via ORAL
  Filled 2015-06-17: qty 2

## 2015-06-17 MED ORDER — HYDROCODONE-ACETAMINOPHEN 7.5-325 MG PO TABS: 1.0000 | ORAL_TABLET | ORAL | Status: DC | PRN

## 2015-06-17 NOTE — ED Notes (Signed)
Pt states she cannot tolerate the pain in her feet and legs that she was told is her diabetic nerve pain, states Lyrica is not helping yet

## 2015-06-17 NOTE — Discharge Instructions (Signed)
Chronic Pain  Chronic pain can be defined as pain that is off and on and lasts for 3-6 months or longer. Many things cause chronic pain, which can make it difficult to make a diagnosis. There are many treatment options available for chronic pain. However, finding a treatment that works well for you may require trying various approaches until the right one is found. Many people benefit from a combination of two or more types of treatment to control their pain.  SYMPTOMS   Chronic pain can occur anywhere in the body and can range from mild to very severe. Some types of chronic pain include:  · Headache.  · Low back pain.  · Cancer pain.  · Arthritis pain.  · Neurogenic pain. This is pain resulting from damage to nerves.   People with chronic pain may also have other symptoms such as:  · Depression.  · Anger.  · Insomnia.  · Anxiety.  DIAGNOSIS   Your health care provider will help diagnose your condition over time. In many cases, the initial focus will be on excluding possible conditions that could be causing the pain. Depending on your symptoms, your health care provider may order tests to diagnose your condition. Some of these tests may include:   · Blood tests.    · CT scan.    · MRI.    · X-rays.    · Ultrasounds.    · Nerve conduction studies.    You may need to see a specialist.   TREATMENT   Finding treatment that works well may take time. You may be referred to a pain specialist. He or she may prescribe medicine or therapies, such as:   · Mindful meditation or yoga.  · Shots (injections) of numbing or pain-relieving medicines into the spine or area of pain.  · Local electrical stimulation.  · Acupuncture.    · Massage therapy.    · Aroma, color, light, or sound therapy.    · Biofeedback.    · Working with a physical therapist to keep from getting stiff.    · Regular, gentle exercise.    · Cognitive or behavioral therapy.    · Group support.    Sometimes, surgery may be recommended.   HOME CARE INSTRUCTIONS    · Take all medicines as directed by your health care provider.    · Lessen stress in your life by relaxing and doing things such as listening to calming music.    · Exercise or be active as directed by your health care provider.    · Eat a healthy diet and include things such as vegetables, fruits, fish, and lean meats in your diet.    · Keep all follow-up appointments with your health care provider.    · Attend a support group with others suffering from chronic pain.  SEEK MEDICAL CARE IF:   · Your pain gets worse.    · You develop a new pain that was not there before.    · You cannot tolerate medicines given to you by your health care provider.    · You have new symptoms since your last visit with your health care provider.    SEEK IMMEDIATE MEDICAL CARE IF:   · You feel weak.    · You have decreased sensation or numbness.    · You lose control of bowel or bladder function.    · Your pain suddenly gets much worse.    · You develop shaking.  · You develop chills.  · You develop confusion.  · You develop chest pain.  · You develop shortness of breath.    MAKE SURE YOU:  ·   Document Revised: 11/13/2012 Document Reviewed: 09/06/2012 Elsevier Interactive Patient Education 2016 [employer].  Diabetic Neuropathy Diabetic neuropathy is a nerve disease or nerve damage that is caused by diabetes mellitus. About half of all people with diabetes mellitus have some form of nerve damage. Nerve damage is more common in those who have had diabetes mellitus for many years and who generally have not had good control of their blood sugar (glucose) level. Diabetic neuropathy is a common  complication of diabetes mellitus. There are three common types of diabetic neuropathy and a fourth type that is less common and less understood:   Peripheral neuropathy--This is the most common type of diabetic neuropathy. It causes damage to the nerves of the feet and legs first and then eventually the hands and arms.The damage affects the ability to sense touch.  Autonomic neuropathy--This type causes damage to the autonomic nervous system, which controls the following functions:  Heartbeat.  Body temperature.  Blood pressure.  Urination.  Digestion.  Sweating.  Sexual function.  Focal neuropathy--Focal neuropathy can be painful and unpredictable and occurs most often in older adults with diabetes mellitus. It involves a specific nerve or one area and often comes on suddenly. It usually does not cause long-term problems.  Radiculoplexus neuropathy-- Sometimes called lumbosacral radiculoplexus neuropathy, radiculoplexus neuropathy affects the nerves of the thighs, hips, buttocks, or legs. It is more common in people with type 2 diabetes mellitus and in older men. It is characterized by debilitating pain, weakness, and atrophy, usually in the thigh muscles. CAUSES  The cause of peripheral, autonomic, and focal neuropathies is diabetes mellitus that is uncontrolled and high glucose levels. The cause of radiculoplexus neuropathy is unknown. However, it is thought to be caused by inflammation related to uncontrolled glucose levels. SIGNS AND SYMPTOMS  Peripheral Neuropathy Peripheral neuropathy develops slowly over time. When the nerves of the feet and legs no longer work there may be:   Burning, stabbing, or aching pain in the legs or feet.  Inability to feel pressure or pain in your feet. This can lead to:  Thick calluses over pressure areas.  Pressure sores.  Ulcers.  Foot deformities.  Reduced ability to feel temperature changes.  Muscle weakness. Autonomic  Neuropathy The symptoms of autonomic neuropathy vary depending on which nerves are affected. Symptoms may include:  Problems with digestion, such as:  Feeling sick to your stomach (nausea).  Vomiting.  Bloating.  Constipation.  Diarrhea.  Abdominal pain.  Difficulty with urination. This occurs if you lose your ability to sense when your bladder is full. Problems include:  Urine leakage (incontinence).  Inability to empty your bladder completely (retention).  Rapid or irregular heartbeat (palpitations).  Blood pressure drops when you stand up (orthostatic hypotension). When you stand up you may feel:  Dizzy.  Weak.  Faint.  In men, inability to attain and maintain an erection.  In women, vaginal dryness and problems with decreased sexual desire and arousal.  Problems with body temperature regulation.  Increased or decreased sweating. Focal Neuropathy  Abnormal eye movements or abnormal alignment of both eyes.  Weakness in the wrist.  Foot drop. This results in an inability to lift the foot properly and abnormal walking or foot movement.  Paralysis on one side of your face (Bell palsy).  Chest or abdominal pain. Radiculoplexus Neuropathy  Sudden, severe pain in your hip, thigh, or buttocks.  Weakness and wasting of thigh muscles.  Difficulty rising from a seated position.  Abdominal swelling.  Unexplained weight loss (  usually more than 10 lb [4.5 kg]). DIAGNOSIS  Peripheral Neuropathy Your senses may be tested. Sensory function testing can be done with:  A light touch using a monofilament.  A vibration with tuning fork.  A sharp sensation with a pin prick. Other tests that can help diagnose neuropathy are:  Nerve conduction velocity. This test checks the transmission of an electrical current through a nerve.  Electromyography. This shows how muscles respond to electrical signals transmitted by nearby nerves.  Quantitative sensory testing.  This is used to assess how your nerves respond to vibrations and changes in temperature. Autonomic Neuropathy Diagnosis is often based on reported symptoms. Tell your health care provider if you experience:   Dizziness.   Constipation.   Diarrhea.   Inappropriate urination or inability to urinate.   Inability to get or maintain an erection.  Tests that may be done include:   Electrocardiography or Holter monitor. These are tests that can help show problems with the heart rate or heart rhythm.   An X-ray exam may be done. Focal Neuropathy Diagnosis is made based on your symptoms and what your health care provider finds during your exam. Other tests may be done. They may include:  Nerve conduction velocities. This checks the transmission of electrical current through a nerve.  Electromyography. This shows how muscles respond to electrical signals transmitted by nearby nerves.  Quantitative sensory testing. This test is used to assess how your nerves respond to vibration and changes in temperature. Radiculoplexus Neuropathy  Often the first thing is to eliminate any other issue or problems that might be the cause, as there is no stick test for diagnosis.  X-ray exam of your spine and lumbar region.  Spinal tap to rule out cancer.  MRI to rule out other lesions. TREATMENT  Once nerve damage occurs, it cannot be reversed. The goal of treatment is to keep the disease or nerve damage from getting worse and affecting more nerve fibers. Controlling your blood glucose level is the key. Most people with radiculoplexus neuropathy see at least a partial improvement over time. You will need to keep your blood glucose and HbA1c levels in the target range determined by your health care provider. Things that help control blood glucose levels include:   Blood glucose monitoring.   Meal planning.   Physical activity.   Diabetes medicine.  Over time, maintaining lower blood glucose  levels helps lessen symptoms. Sometimes, prescription pain medicine is needed. HOME CARE INSTRUCTIONS:  Do not smoke.  Keep your blood glucose level in the range that you and your health care provider have determined acceptable for you.  Keep your blood pressure level in the range that you and your health care provider have determined acceptable for you.  Eat a well-balanced diet.  Be physically active every day. Include strength training and balance exercises.  Protect your feet.  Check your feet every day for sores, cuts, blisters, or signs of infection.  Wear padded socks and supportive shoes. Use orthotic inserts, if necessary.  Regularly check the insides of your shoes for worn spots. Make sure there are no rocks or other items inside your shoes before you put them on. SEEK MEDICAL CARE IF:   You have burning, stabbing, or aching pain in the legs or feet.  You are unable to feel pressure or pain in your feet.  You develop problems with digestion such as:  Nausea.  Vomiting.  Bloating.  Constipation.  Diarrhea.  Abdominal pain.  You have  difficulty with urination, such as:  Incontinence.  Retention.  You have palpitations.  You develop orthostatic hypotension. When you stand up you may feel:  Dizzy.  Weak.  Faint.  You cannot attain and maintain an erection (in men).  You have vaginal dryness and problems with decreased sexual desire and arousal (in women).  You have severe pain in your thighs, legs, or buttocks.  You have unexplained weight loss.   This information is not intended to replace advice given to you by your health care provider. Make sure you discuss any questions you have with your health care provider.   Document Released: 05/22/2001 Document Revised: 04/03/2014 Document Reviewed: 08/22/2012 Elsevier Interactive Patient Education [employer].

## 2015-06-17 NOTE — Telephone Encounter (Signed)
Patient requesting refill of Hydrocodone 7.5/325mg   Qty 120 Tablets

## 2015-06-17 NOTE — ED Provider Notes (Signed)
TIME SEEN: 3:20 AM  CHIEF COMPLAINT: Diabetic neuropathy  HPI: Pt is a 70 y.o. female with history of hypertension, diabetes, neuropathy who presents to the emergency department with complaints of bilateral leg pain that she describes a sharp stabbing pain. Has been present for many weeks. States that her doctor previously had her on Neurontin and then switch her to Lyrica. She reports that Neurontin was helping more than Lyrica she states that neither made her feel that much better. Was seen in the emergency department on 06/11/15 and was given Percocet which she states helps with her pain significantly. She is out of this medication. States that she cannot get in to see her primary care physician until the beginning of April.. No injury to the legs. No redness or warmth. No fever. No new numbness or focal weakness.  .  ROS: See HPI Constitutional: no fever  Eyes: no drainage  ENT: no runny nose   Cardiovascular:  no chest pain  Resp: no SOB  GI: no vomiting GU: no dysuria Integumentary: no rash  Allergy: no hives  Musculoskeletal: no leg swelling  Neurological: no slurred speech ROS otherwise negative  PAST MEDICAL HISTORY/PAST SURGICAL HISTORY:  Past Medical History  Diagnosis Date  . Brain tumor [facility])     Surgery for benign brain tumor in the remote past  . Hypertension   . Preop cardiovascular exam     Cardiac clearance for knee surgery, January, 2013  . Edema   . Tobacco abuse   . Ejection fraction     EF 70%, echo, February, 2013  . Sleep apnea     uses Cpap  . Hepatitis 1970's    hep c  . Anxiety   . 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 Nasal congestion   . Diabetes mellitus without complication ([place])   . Insomnia   . Spinal cord stimulator status     for pain   . Chronic knee pain   . Chronic back pain     with radiculopathy bilat legs  . Depression   . Arthritis   . 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 Renal disorder     was diagnosed with kidney CA but tumor shrinked  without intervention    MEDICATIONS:  Prior to Admission medications   Medication Sig Start Date End Date Taking? Authorizing Provider  diphenhydrAMINE (BENADRYL) 25 MG tablet Take 1 tablet (25 mg total) by mouth every 6 (six) hours as needed for itching. 04/04/15  Yes Davonna Belling, MD  hydrochlorothiazide (MICROZIDE) 12.5 MG capsule Take 1 capsule by mouth daily. 04/19/15  Yes Historical Provider, MD  ibuprofen (ADVIL,MOTRIN) 200 MG tablet Take 200 mg by mouth every 6 (six) hours as needed for mild pain or moderate pain.   Yes Historical Provider, MD  Insulin Detemir (LEVEMIR FLEXTOUCH) 100 UNIT/ML Pen Inject 20 Units into the skin daily at 10 pm.   Yes Historical Provider, MD  metFORMIN (GLUCOPHAGE) 500 MG tablet Take 500 mg by mouth 2 (two) times daily.   Yes Historical Provider, MD  Omega-3 Fatty Acids (FISH OIL) 1000 MG CAPS Take 1 capsule by mouth daily.   Yes Historical Provider, MD  oxyCODONE-acetaminophen (PERCOCET/ROXICET) 5-325 MG tablet Take 1 tablet by mouth every 4 (four) hours as needed for severe pain. 06/11/15  Yes Almyra Free Idol, PA-C  pregabalin (LYRICA) 50 MG capsule Take 50 mg by mouth 3 (three) times daily.   Yes Historical Provider, MD  telmisartan (  MICARDIS) 80 MG tablet Take 80 mg by mouth daily. 04/19/15  Yes Historical Provider, MD  ASHWAGANDHA PO Take 1 capsule by mouth daily.    Historical Provider, MD  cyclobenzaprine (FLEXERIL) 5 MG tablet Take 1 tablet (5 mg total) by mouth 3 (three) times daily as needed. Patient taking differently: Take 5 mg by mouth 3 (three) times daily as needed for muscle spasms.  06/01/15   Tammy Triplett, PA-C  gabapentin (NEURONTIN) 100 MG capsule Take 1 capsule (100 mg total) by mouth 3 (three) times daily. 06/06/15   Lily Kocher, PA-C  HYDROcodone-acetaminophen (NORCO/VICODIN) 5-325 MG tablet Take 1 tablet by mouth every 6 (six) hours as needed. 05/16/15   Milton Ferguson, MD  hydrOXYzine (ATARAX/VISTARIL) 25 MG tablet Take 1 tablet (25 mg total)  by mouth every 4 (four) hours as needed for itching. 05/16/15   Milton Ferguson, MD  predniSONE (DELTASONE) 20 MG tablet Take 2 tablets (40 mg total) by mouth daily. Patient not taking: Reported on 05/16/2015 04/05/15   Davonna Belling, MD  PRESCRIPTION MEDICATION Take 1 capsule by mouth 3 (three) times daily. Lyrica - white capsules    Historical Provider, MD    ALLERGIES:  No Known Allergies  SOCIAL HISTORY:  Social History  Substance Use Topics  . Smoking status: Former Smoker -- 0.50 packs/day for 10 years    Types: Cigarettes    Quit date: 12/26/2013  . Smokeless tobacco: Never Used  . Alcohol Use: No     Comment: none since 03-28-2011    FAMILY HISTORY: Family History  Problem Relation Age of Onset  . Cancer Mother   . Hypertension Mother   . Anesthesia problems Neg Hx   . Diabetes Mother   . Diabetes Brother     EXAM: BP 177/82 mmHg  Pulse 98  Temp(Src) 98 F (36.7 C) (Oral)  Resp 24  Ht 5\' 1"  (1.549 m)  Wt 163 lb (73.936 kg)  BMI 30.81 kg/m2  SpO2 99% CONSTITUTIONAL: Alert and oriented and responds appropriately to questions. Well-appearing; well-nourished HEAD: Normocephalic EYES: Conjunctivae clear, PERRL ENT: normal nose; no rhinorrhea; moist mucous membranes NECK: Supple, no meningismus, no LAD  CARD: RRR; S1 and S2 appreciated; no murmurs, no clicks, no rubs, no gallops RESP: Normal chest excursion without splinting or tachypnea; breath sounds clear and equal bilaterally; no wheezes, no rhonchi, no rales, no hypoxia or respiratory distress, speaking full sentences ABD/GI: Normal bowel sounds; non-distended; soft, non-tender, no rebound, no guarding, no peritoneal signs BACK:  The back appears normal and is non-tender to palpation, there is no CVA tenderness EXT: Normal ROM in all joints; non-tender to palpation; no edema; normal capillary refill; no cyanosis, no calf tenderness or swelling    SKIN: Normal color for age and race; warm; no rash NEURO: Moves all  extremities equally, sensation to light touch intact diffusely, cranial nerves II through XII intact PSYCH: The patient's mood and manner are appropriate. Grooming and personal hygiene are appropriate.  MEDICAL DECISION MAKING: Patient here with chronic pain from her neuropathy. Have advised patient that the emergency department is not the appropriate place to treat chronic pain. There is no history of injury. Doubt fracture or dislocation. No sign of septic arthritis or gout. No sign of compartment syndrome. Neurovascular intact distally. I do not feel she needs further emergent workup. Reports her blood glucose has been in the 150s at home. She is currently eating Fritos and drinking soda in the room. Have advised her against these types of  foods as this will likely make her blood sugar higher which will worsen her neuropathy. We'll discharge with prescription for oxycodone for pain control. She does have an appointment scheduled with her PCP the beginning of April. Discussed return precautions. She verbalized understanding and is comfortable with this plan.   [place], DO 06/17/15 [phone]

## 2015-06-17 NOTE — Telephone Encounter (Signed)
Rx done. 

## 2015-06-29 ENCOUNTER — Encounter: Payer: Self-pay | Admitting: Orthopaedic Surgery

## 2015-06-29 ENCOUNTER — Ambulatory Visit (INDEPENDENT_AMBULATORY_CARE_PROVIDER_SITE_OTHER): Payer: Medicare HMO | Admitting: Orthopaedic Surgery

## 2015-06-29 VITALS — BP 145/78 | HR 101 | Temp 97.7°F | Resp 16 | Ht 61.0 in | Wt 163.0 lb

## 2015-06-29 DIAGNOSIS — M545 Low back pain, unspecified: Secondary | ICD-10-CM

## 2015-06-29 NOTE — Progress Notes (Signed)
Patient [id number] Rebecca Richards, female DOB:[birth year], 70 y.o. [id number]  Chief Complaint  Patient presents with  . Back Pain  . Leg Pain    HPI  Rebecca Richards is a 70 y.o. female who has chronic lower back pain that is stable.  She has no paresthesias.  She has no new trauma.  She has no bowel or bladder problems.  She is doing her exercises and taking her medicine.  She also has peripheral neuropathy of the feet treated by Dr. Criss Rosales.  Back Pain This is a chronic problem. The current episode started more than 1 year ago. The problem occurs daily. The problem has been waxing and waning since onset. The pain is present in the lumbar spine. The quality of the pain is described as aching. The pain does not radiate. The pain is at a severity of 3/10. The pain is mild. The symptoms are aggravated by bending, twisting and stress. Associated symptoms include leg pain. Pertinent negatives include no chest pain. She has tried heat, bed rest, analgesics, home exercises and ice for the symptoms. The treatment provided mild relief.  Leg Pain     Body mass index is 30.81 kg/(m^2).   Review of Systems  HENT: Negative for congestion.   Respiratory: Negative for cough and shortness of breath.   Cardiovascular: Negative for chest pain and leg swelling.  Musculoskeletal: Positive for myalgias, back pain and arthralgias.    Past Medical History  Diagnosis Date  . Brain tumor [facility])     Surgery for benign brain tumor in the remote past  . Hypertension   . Preop cardiovascular exam     Cardiac clearance for knee surgery, January, 2013  . Edema   . Tobacco abuse   . Ejection fraction     EF 70%, echo, February, 2013  . Sleep apnea     uses Cpap  . Hepatitis 1970's    hep c  . Anxiety   . 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 Nasal congestion   . Diabetes mellitus without complication ([place])   . Insomnia   . Spinal cord stimulator status     for pain   . Chronic knee pain   . Chronic back pain      with radiculopathy bilat legs  . Depression   . Arthritis   . 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 Renal disorder     was diagnosed with kidney CA but tumor shrinked without intervention    Past Surgical History  Procedure Laterality Date  . Abdominal hysterectomy    . Knee surgery    . Nose surgery    . Back surgery      pt has stimulator in lower back and can't have an MRI  . Total knee arthroplasty  07/21/2011    Procedure: TOTAL KNEE ARTHROPLASTY;  Surgeon: Yvette Rack., MD;  Location: [place];  Service: Orthopedics;  Laterality: Left;  . 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 Cholecystectomy N/A 09/30/2012    Procedure: LAPAROSCOPIC CHOLECYSTECTOMY;  Surgeon: Donato Heinz, MD;  Location: [facility];  Service: General;  Laterality: N/A;    Family History  Problem Relation Age of Onset  . Cancer Mother   . Hypertension Mother   . Anesthesia problems Neg Hx   . Diabetes Mother   . Diabetes Brother     Social History Social History  Substance Use Topics  . Smoking  status: Former Smoker -- 0.50 packs/day for 10 years    Types: Cigarettes    Quit date: 12/26/2013  . Smokeless tobacco: Never Used  . Alcohol Use: No     Comment: none since 03-28-2011    No Known Allergies  Current Outpatient Prescriptions  Medication Sig Dispense Refill  . diphenhydrAMINE (BENADRYL) 25 MG tablet Take 1 tablet (25 mg total) by mouth every 6 (six) hours as needed for itching. 20 tablet 0  . hydrochlorothiazide (MICROZIDE) 12.5 MG capsule Take 1 capsule by mouth daily.    Marland Kitchen HYDROcodone-acetaminophen (NORCO) 7.5-325 MG tablet Take 1 tablet by mouth every 4 (four) hours as needed for moderate pain (Must last 30 days.  Do not drive or operate machinery while taking this medicine.). 120 tablet 0  . ibuprofen (ADVIL,MOTRIN) 200 MG tablet Take 200 mg by mouth every 6 (six) hours as needed for mild pain or moderate pain.    . Insulin Detemir (LEVEMIR FLEXTOUCH) 100 UNIT/ML Pen Inject 20 Units  into the skin daily at 10 pm.    . metFORMIN (GLUCOPHAGE) 500 MG tablet Take 500 mg by mouth 2 (two) times daily.    . Omega-3 Fatty Acids (FISH OIL) 1000 MG CAPS Take 1 capsule by mouth daily.    . pregabalin (LYRICA) 50 MG capsule Take 50 mg by mouth 3 (three) times daily.    Marland Kitchen telmisartan (MICARDIS) 80 MG tablet Take 80 mg by mouth daily.    . [DISCONTINUED] gabapentin (NEURONTIN) 100 MG capsule Take 1 capsule (100 mg total) by mouth 3 (three) times daily. 10 capsule 0   No current facility-administered medications for this visit.     Physical Exam  Blood pressure 145/78, pulse 101, temperature 97.7 F (36.5 C), resp. rate 16, height 5\' 1"  (1.549 m), weight 163 lb (73.936 kg).  Constitutional: overall normal hygiene, normal nutrition, well developed, normal grooming, normal body habitus. Assistive device:none  Musculoskeletal: gait and station Limp none, muscle tone and strength are normal, no tremors or atrophy is present.  .  Neurological: coordination overall normal.  Deep tendon reflex/nerve stretch intact.  Sensation normal.  Cranial nerves II-XII intact.   Skin:   normal overall no scars, lesions, ulcers or rashes. No psoriasis.  Psychiatric: Alert and oriented x 3.  Recent memory intact, remote memory unclear.  Normal mood and affect. Well groomed.  Good eye contact.  Cardiovascular: overall no swelling, no varicosities, no edema bilaterally, normal temperatures of the legs and arms, no clubbing, cyanosis and good capillary refill.  Lymphatic: palpation is normal. Spine/Pelvis examination:  Inspection:  Overall, sacoiliac joint benign and hips nontender; without crepitus or defects.   Thoracic spine inspection: Alignment normal without kyphosis present   Lumbar spine inspection:  Alignment  with normal lumbar lordosis, without scoliosis apparent.   Thoracic spine palpation:  without tenderness of spinal processes   Lumbar spine palpation: with tenderness of lumbar  area; without tightness of lumbar muscles    Range of Motion:   Lumbar flexion, forward flexion is 25  with pain or tenderness    Lumbar extension is 5  without pain or tenderness   Left lateral bend is Normal  without pain or tenderness   Right lateral bend is Normal without pain or tenderness   Straight leg raising is Normal   Strength & tone: Normal   Stability overall normal stability   The patient has been educated about the nature of the problem(s) and counseled on treatment options.  The patient  appeared to understand what I have discussed and is in agreement with it.  Encounter Diagnosis  Name Primary?  . Midline low back pain without sciatica Yes    PLAN Call if any problems.  Precautions discussed.  Continue current medications.   Return to clinic 3 months

## 2015-07-09 ENCOUNTER — Emergency Department (HOSPITAL_COMMUNITY): Payer: Medicare HMO

## 2015-07-09 ENCOUNTER — Emergency Department (HOSPITAL_COMMUNITY)
Admission: EM | Admit: 2015-07-09 | Discharge: 2015-07-10 | Disposition: A | Discharge status: Home / Self Care | Payer: Medicare HMO | Attending: Emergency Medicine | Admitting: Emergency Medicine

## 2015-07-09 ENCOUNTER — Encounter (HOSPITAL_COMMUNITY): Payer: Self-pay

## 2015-07-09 DIAGNOSIS — Z85528 Personal history of other malignant neoplasm of kidney: Secondary | ICD-10-CM | POA: Insufficient documentation

## 2015-07-09 DIAGNOSIS — R109 Unspecified abdominal pain: Secondary | ICD-10-CM

## 2015-07-09 DIAGNOSIS — E119 Type 2 diabetes mellitus without complications: Secondary | ICD-10-CM | POA: Insufficient documentation

## 2015-07-09 DIAGNOSIS — I10 Essential (primary) hypertension: Secondary | ICD-10-CM | POA: Diagnosis not present

## 2015-07-09 DIAGNOSIS — Z87891 Personal history of nicotine dependence: Secondary | ICD-10-CM | POA: Insufficient documentation

## 2015-07-09 DIAGNOSIS — Z79899 Other long term (current) drug therapy: Secondary | ICD-10-CM | POA: Diagnosis not present

## 2015-07-09 DIAGNOSIS — F329 Major depressive disorder, single episode, unspecified: Secondary | ICD-10-CM | POA: Insufficient documentation

## 2015-07-09 DIAGNOSIS — R1010 Upper abdominal pain, unspecified: Secondary | ICD-10-CM | POA: Diagnosis not present

## 2015-07-09 LAB — CBC WITH DIFFERENTIAL/PLATELET
BASOS ABS: 0 10*3/uL (ref 0.0–0.1)
BASOS PCT: 0 %
Eosinophils Absolute: 0.2 10*3/uL (ref 0.0–0.7)
Eosinophils Relative: 2 %
HEMATOCRIT: 33.7 % — AB (ref 36.0–46.0)
HEMOGLOBIN: 11.3 g/dL — AB (ref 12.0–15.0)
Lymphocytes Relative: 16 %
Lymphs Abs: 1.8 10*3/uL (ref 0.7–4.0)
MCH: 28 pg (ref 26.0–34.0)
MCHC: 33.5 g/dL (ref 30.0–36.0)
MCV: 83.6 fL (ref 78.0–100.0)
Monocytes Absolute: 0.7 10*3/uL (ref 0.1–1.0)
Monocytes Relative: 7 %
NEUTROS ABS: 8.4 10*3/uL — AB (ref 1.7–7.7)
NEUTROS PCT: 75 %
Platelets: 316 10*3/uL (ref 150–400)
RBC: 4.03 MIL/uL (ref 3.87–5.11)
RDW: 15.5 % (ref 11.5–15.5)
WBC: 11.1 10*3/uL — ABNORMAL HIGH (ref 4.0–10.5)

## 2015-07-09 LAB — URINALYSIS, ROUTINE W REFLEX MICROSCOPIC
Bilirubin Urine: NEGATIVE
GLUCOSE, UA: NEGATIVE mg/dL
KETONES UR: NEGATIVE mg/dL
LEUKOCYTES UA: NEGATIVE
Nitrite: NEGATIVE
PH: 5.5 (ref 5.0–8.0)
Protein, ur: NEGATIVE mg/dL
Specific Gravity, Urine: <1.005 — ABNORMAL LOW (ref 1.005–1.030)

## 2015-07-09 LAB — URINE MICROSCOPIC-ADD ON

## 2015-07-09 LAB — COMPREHENSIVE METABOLIC PANEL
ALBUMIN: 3.5 g/dL (ref 3.5–5.0)
ALK PHOS: 70 U/L (ref 38–126)
ALT: 9 U/L — AB (ref 14–54)
AST: 20 U/L (ref 15–41)
Anion gap: 10 (ref 5–15)
BILIRUBIN TOTAL: 0.7 mg/dL (ref 0.3–1.2)
BUN: 17 mg/dL (ref 6–20)
CALCIUM: 8.5 mg/dL — AB (ref 8.9–10.3)
CO2: 20 mmol/L — ABNORMAL LOW (ref 22–32)
CREATININE: 1.06 mg/dL — AB (ref 0.44–1.00)
Chloride: 111 mmol/L (ref 101–111)
GFR calc Af Amer: >60 mL/min (ref >60)
GFR calc non Af Amer: 53 mL/min — ABNORMAL LOW (ref >60)
GLUCOSE: 78 mg/dL (ref 65–99)
POTASSIUM: 3.3 mmol/L — AB (ref 3.5–5.1)
Sodium: 141 mmol/L (ref 135–145)
TOTAL PROTEIN: 7.3 g/dL (ref 6.5–8.1)

## 2015-07-09 LAB — LIPASE, BLOOD: Lipase: 28 U/L (ref 11–51)

## 2015-07-09 MED ORDER — IOPAMIDOL (ISOVUE-300) INJECTION 61%
100.0000 mL | Freq: Once | INTRAVENOUS | Status: AC | PRN
  Administered 2015-07-09: 100 mL via INTRAVENOUS

## 2015-07-09 MED ORDER — MORPHINE SULFATE (PF) 4 MG/ML IV SOLN
6.0000 mg | Freq: Once | INTRAVENOUS | Status: AC
  Administered 2015-07-09: 6 mg via INTRAVENOUS
  Filled 2015-07-09: qty 2

## 2015-07-09 MED ORDER — ONDANSETRON HCL 4 MG/2ML IJ SOLN
4.0000 mg | Freq: Once | INTRAMUSCULAR | Status: AC
  Administered 2015-07-09: 4 mg via INTRAVENOUS
  Filled 2015-07-09: qty 2

## 2015-07-09 MED ORDER — IOPAMIDOL (ISOVUE-300) INJECTION 61%
INTRAVENOUS | Status: AC
  Filled 2015-07-09: qty 100

## 2015-07-09 MED ORDER — SODIUM CHLORIDE 0.9 % IV SOLN
1000.0000 mL | Freq: Once | INTRAVENOUS | Status: AC
  Administered 2015-07-09: 1000 mL via INTRAVENOUS

## 2015-07-09 MED ORDER — DIATRIZOATE MEGLUMINE & SODIUM 66-10 % PO SOLN
ORAL | Status: AC
  Filled 2015-07-09: qty 30

## 2015-07-09 MED ORDER — SODIUM CHLORIDE 0.9 % IV SOLN
1000.0000 mL | INTRAVENOUS | Status: DC
  Administered 2015-07-09: 1000 mL via INTRAVENOUS

## 2015-07-09 MED ORDER — LORAZEPAM 2 MG/ML IJ SOLN
1.0000 mg | Freq: Once | INTRAMUSCULAR | Status: AC
  Administered 2015-07-09: 1 mg via INTRAVENOUS
  Filled 2015-07-09: qty 1

## 2015-07-09 NOTE — ED Notes (Signed)
Pt in by [facility] for RUQ abd pain that started yesterday and is worse today, states she is vomiting and having watery diarrhea

## 2015-07-10 MED ORDER — ONDANSETRON 8 MG PO TBDP: 8.0000 mg | ORAL_TABLET | Freq: Three times a day (TID) | ORAL | Status: AC | PRN

## 2015-07-10 MED ORDER — OXYCODONE-ACETAMINOPHEN 5-325 MG PO TABS
1.0000 | ORAL_TABLET | Freq: Once | ORAL | Status: AC
  Administered 2015-07-10: 1 via ORAL
  Filled 2015-07-10: qty 1

## 2015-07-10 NOTE — Discharge Instructions (Signed)

## 2015-07-10 NOTE — ED Provider Notes (Signed)
CSN: [id number]     Arrival date & time 07/09/15  1925 History   First MD Initiated Contact with Patient 07/09/15 1950     Chief Complaint  Patient presents with  . Abdominal Pain     HPI Patient reports generalized upper crampy abdominal pain which began yesterday and seems worse today.  She did have some nausea vomiting and diarrhea.  She reports the pain is intermittent and crampy in nature.  She has had a cholecystectomy.  Her symptoms are moderate in severity.  No fevers or chills.  No dysuria or urinary frequency.  Denies lower abdominal pain.   Past Medical History  Diagnosis Date  . Brain tumor [facility])     Surgery for benign brain tumor in the remote past  . Hypertension   . Preop cardiovascular exam     Cardiac clearance for knee surgery, January, 2013  . Edema   . Tobacco abuse   . Ejection fraction     EF 70%, echo, February, 2013  . Sleep apnea     uses Cpap  . Hepatitis 1970's    hep c  . Anxiety   . 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 Nasal congestion   . Diabetes mellitus without complication ([place])   . Insomnia   . Spinal cord stimulator status     for pain   . Chronic knee pain   . Chronic back pain     with radiculopathy bilat legs  . Depression   . Arthritis   . 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 Abdominal hysterectomy    . Knee surgery    . Nose surgery    . Back surgery      pt has stimulator in lower back and can't have an MRI  . Total knee arthroplasty  07/21/2011    Procedure: TOTAL KNEE ARTHROPLASTY;  Surgeon: Yvette Rack., MD;  Location: [place];  Service: Orthopedics;  Laterality: Left;  . 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 Cholecystectomy N/A 09/30/2012    Procedure: LAPAROSCOPIC CHOLECYSTECTOMY;  Surgeon: Donato Heinz, MD;  Location: [facility];  Service: General;  Laterality: N/A;   Family History   Problem Relation Age of Onset  . Cancer Mother   . Hypertension Mother   . Anesthesia problems Neg Hx   . Diabetes Mother   . Diabetes Brother    Social History  Substance Use Topics  . Smoking status: Former Smoker -- 0.50 packs/day for 10 years    Types: Cigarettes    Quit date: 12/26/2013  . Smokeless tobacco: Never Used  . Alcohol Use: No     Comment: none since 03-28-2011   OB History    Gravida Para Term Preterm AB TAB SAB Ectopic Multiple Living   1 1 1       1      Review of Systems  All other systems reviewed and are negative.     Allergies  Review of patient's allergies indicates no known allergies.  Home Medications   Prior to Admission medications   Medication Sig Start Date End Date Taking? Authorizing Provider  hydrochlorothiazide (MICROZIDE) 12.5 MG capsule Take 1 capsule by mouth daily. 04/19/15  Yes Historical Provider, MD  HYDROcodone-acetaminophen (NORCO) 7.5-325 MG tablet Take 1  tablet by mouth every 4 (four) hours as needed for moderate pain (Must last 30 days.  Do not drive or operate machinery while taking this medicine.). 06/17/15  Yes Sanjuana Kava, MD  ibuprofen (ADVIL,MOTRIN) 200 MG tablet Take 200 mg by mouth every 6 (six) hours as needed for mild pain or moderate pain.   Yes Historical Provider, MD  Insulin Detemir (LEVEMIR FLEXTOUCH) 100 UNIT/ML Pen Inject 20 Units into the skin daily at 10 pm.   Yes Historical Provider, MD  metFORMIN (GLUCOPHAGE) 500 MG tablet Take 500 mg by mouth 2 (two) times daily.   Yes Historical Provider, MD  Omega-3 Fatty Acids (FISH OIL) 1000 MG CAPS Take 1 capsule by mouth daily.   Yes Historical Provider, MD  pregabalin (LYRICA) 50 MG capsule Take 100-150 mg by mouth 2 (two) times daily. 100 mg in the morning and 150 at bedtime   Yes Historical Provider, MD  telmisartan (MICARDIS) 80 MG tablet Take 80 mg by mouth daily. 04/19/15  Yes Historical Provider, MD  ondansetron (ZOFRAN ODT) 8 MG disintegrating tablet Take 1  tablet (8 mg total) by mouth every 8 (eight) hours as needed for nausea or vomiting. 07/10/15   Jola Schmidt, MD   BP 157/89 mmHg  Pulse 86  Temp(Src) 97.7 F (36.5 C) (Oral)  Resp 20  Ht 5\' 1"  (1.549 m)  Wt 163 lb (73.936 kg)  BMI 30.81 kg/m2  SpO2 97% Physical Exam  Constitutional: She is oriented to person, place, and time. She appears well-developed and well-nourished. No distress.  HENT:  Head: Normocephalic and atraumatic.  Eyes: EOM are normal.  Neck: Normal range of motion.  Cardiovascular: Normal rate, regular rhythm and normal heart sounds.   Pulmonary/Chest: Effort normal and breath sounds normal.  Abdominal: Soft. She exhibits no distension.  Mild upper abdominal tenderness without guarding or rebound.  Musculoskeletal: Normal range of motion.  Neurological: She is alert and oriented to person, place, and time.  Skin: Skin is warm and dry.  Psychiatric: She has a normal mood and affect. Judgment normal.  Nursing note and vitals reviewed.   ED Course  Procedures (including critical care time) Labs Review Labs Reviewed  CBC WITH DIFFERENTIAL/PLATELET - Abnormal; Notable for the following:    WBC 11.1 (*)    Hemoglobin 11.3 (*)    HCT 33.7 (*)    Neutro Abs 8.4 (*)    All other components within normal limits  COMPREHENSIVE METABOLIC PANEL - Abnormal; Notable for the following:    Potassium 3.3 (*)    CO2 20 (*)    Creatinine, Ser 1.06 (*)    Calcium 8.5 (*)    ALT 9 (*)    GFR calc non Af Amer 53 (*)    All other components within normal limits  URINALYSIS, ROUTINE W REFLEX MICROSCOPIC (NOT AT [facility]) - Abnormal; Notable for the following:    Specific Gravity, Urine <1.005 (*)    Hgb urine dipstick TRACE (*)    All other components within normal limits  URINE MICROSCOPIC-ADD ON - Abnormal; Notable for the following:    Squamous Epithelial / LPF 0-5 (*)    Bacteria, UA RARE (*)    All other components within normal limits  LIPASE, BLOOD    Imaging  Review Ct Abdomen Pelvis W Contrast  07/10/2015  CLINICAL DATA:  Sharp intermittent RIGHT upper quadrant pain beginning yesterday, worse today. EXAM: CT ABDOMEN AND PELVIS WITH CONTRAST TECHNIQUE: Multidetector CT imaging of the abdomen and pelvis was  performed using the standard protocol following bolus administration of intravenous contrast. CONTRAST:  165mL ISOVUE-300 IOPAMIDOL (ISOVUE-300) INJECTION 61% COMPARISON:  CT abdomen and pelvis June 16, 2010 FINDINGS: LUNG BASES: Included view of the lung bases are clear. Visualized heart size is normal. Mild coronary artery calcifications partly imaged. No pericardial effusion. SOLID ORGANS: The liver, spleen, pancreas and adrenal glands are unremarkable. Status post cholecystectomy. GASTROINTESTINAL TRACT: The stomach, small and large bowel are normal in course and caliber without inflammatory changes. Mild descending colon and sigmoid diverticulosis. Normal appendix. KIDNEYS/ URINARY TRACT: Kidneys are orthotopic, demonstrating symmetric enhancement. No nephrolithiasis, hydronephrosis or solid renal masses. Stable 14 mm cyst exophytic from lower pole of RIGHT kidney. The unopacified ureters are normal in course and caliber. Delayed imaging through the kidneys demonstrates symmetric prompt contrast excretion within the proximal urinary collecting system. Urinary bladder is decompressed and unremarkable. PERITONEUM/RETROPERITONEUM: Aortoiliac vessels are normal in course and caliber, mild calcific atherosclerosis. Stable 16 mm short access peripancreatic lymph node. Status post hysterectomy. No intraperitoneal free fluid nor free air. SOFT TISSUE/OSSEOUS STRUCTURES: Non-suspicious. Osteopenia. Spinal stimulator battery pack and LEFT flank subcutaneous fat, lead courses toward the thoracic spinal canal via LEFT T9-10 interlaminar approach. Status post L4-5 TLIF, laminectomies with incorporated fusion material. Moderate fat containing umbilical hernia. Moderate  bilateral hip osteoarthrosis. IMPRESSION: No acute intra-abdominal or pelvic process. Status post cholecystectomy. Electronically Signed   By: Elon Alas M.D.   On: 07/10/2015 00:03   I have personally reviewed and evaluated these images and lab results as part of my medical decision-making.   EKG Interpretation None      MDM   Final diagnoses:  Abdominal pain, unspecified abdominal location    Overall the patient is well-appearing.  She feels better after medication in emergency department.  Labs, urine, CT scan without acute abnormality.  Discharge home in good condition.  Primary care follow-up.  She understands to return to the ER for new or worsening symptoms   Jola Schmidt, MD 07/10/15 [id number]

## 2015-07-11 ENCOUNTER — Emergency Department (HOSPITAL_COMMUNITY)
Admission: EM | Admit: 2015-07-11 | Discharge: 2015-07-11 | Disposition: A | Discharge status: Home / Self Care | Payer: Medicare HMO | Attending: Emergency Medicine | Admitting: Emergency Medicine

## 2015-07-11 ENCOUNTER — Encounter (HOSPITAL_COMMUNITY): Payer: Self-pay | Admitting: Emergency Medicine

## 2015-07-11 DIAGNOSIS — E119 Type 2 diabetes mellitus without complications: Secondary | ICD-10-CM | POA: Insufficient documentation

## 2015-07-11 DIAGNOSIS — Z79899 Other long term (current) drug therapy: Secondary | ICD-10-CM | POA: Insufficient documentation

## 2015-07-11 DIAGNOSIS — R1013 Epigastric pain: Secondary | ICD-10-CM | POA: Insufficient documentation

## 2015-07-11 DIAGNOSIS — M549 Dorsalgia, unspecified: Secondary | ICD-10-CM

## 2015-07-11 DIAGNOSIS — Z9049 Acquired absence of other specified parts of digestive tract: Secondary | ICD-10-CM | POA: Insufficient documentation

## 2015-07-11 DIAGNOSIS — Z87891 Personal history of nicotine dependence: Secondary | ICD-10-CM | POA: Diagnosis not present

## 2015-07-11 DIAGNOSIS — I10 Essential (primary) hypertension: Secondary | ICD-10-CM | POA: Diagnosis not present

## 2015-07-11 DIAGNOSIS — G8929 Other chronic pain: Secondary | ICD-10-CM

## 2015-07-11 DIAGNOSIS — R1011 Right upper quadrant pain: Secondary | ICD-10-CM | POA: Diagnosis not present

## 2015-07-11 DIAGNOSIS — Z794 Long term (current) use of insulin: Secondary | ICD-10-CM | POA: Diagnosis not present

## 2015-07-11 DIAGNOSIS — R197 Diarrhea, unspecified: Secondary | ICD-10-CM | POA: Diagnosis not present

## 2015-07-11 DIAGNOSIS — Z791 Long term (current) use of non-steroidal anti-inflammatories (NSAID): Secondary | ICD-10-CM | POA: Insufficient documentation

## 2015-07-11 DIAGNOSIS — Z85528 Personal history of other malignant neoplasm of kidney: Secondary | ICD-10-CM | POA: Diagnosis not present

## 2015-07-11 DIAGNOSIS — R109 Unspecified abdominal pain: Secondary | ICD-10-CM

## 2015-07-11 DIAGNOSIS — R112 Nausea with vomiting, unspecified: Secondary | ICD-10-CM | POA: Insufficient documentation

## 2015-07-11 DIAGNOSIS — Z7984 Long term (current) use of oral hypoglycemic drugs: Secondary | ICD-10-CM | POA: Insufficient documentation

## 2015-07-11 DIAGNOSIS — F329 Major depressive disorder, single episode, unspecified: Secondary | ICD-10-CM | POA: Insufficient documentation

## 2015-07-11 LAB — COMPREHENSIVE METABOLIC PANEL
ALK PHOS: 71 U/L (ref 38–126)
ALT: 13 U/L — AB (ref 14–54)
ANION GAP: 11 (ref 5–15)
AST: 38 U/L (ref 15–41)
Albumin: 3.9 g/dL (ref 3.5–5.0)
BILIRUBIN TOTAL: 0.3 mg/dL (ref 0.3–1.2)
BUN: 11 mg/dL (ref 6–20)
CALCIUM: 8.8 mg/dL — AB (ref 8.9–10.3)
CO2: 22 mmol/L (ref 22–32)
CREATININE: 0.99 mg/dL (ref 0.44–1.00)
Chloride: 107 mmol/L (ref 101–111)
GFR, EST NON AFRICAN AMERICAN: 58 mL/min — AB (ref >60)
Glucose, Bld: 77 mg/dL (ref 65–99)
Potassium: 3.3 mmol/L — ABNORMAL LOW (ref 3.5–5.1)
SODIUM: 140 mmol/L (ref 135–145)
TOTAL PROTEIN: 8 g/dL (ref 6.5–8.1)

## 2015-07-11 LAB — CBC
HCT: 35.7 % — ABNORMAL LOW (ref 36.0–46.0)
HEMOGLOBIN: 11.3 g/dL — AB (ref 12.0–15.0)
MCH: 26.7 pg (ref 26.0–34.0)
MCHC: 31.7 g/dL (ref 30.0–36.0)
MCV: 84.4 fL (ref 78.0–100.0)
PLATELETS: 353 10*3/uL (ref 150–400)
RBC: 4.23 MIL/uL (ref 3.87–5.11)
RDW: 15.7 % — ABNORMAL HIGH (ref 11.5–15.5)
WBC: 8.7 10*3/uL (ref 4.0–10.5)

## 2015-07-11 LAB — URINE MICROSCOPIC-ADD ON
BACTERIA UA: NONE SEEN
WBC, UA: NONE SEEN WBC/hpf (ref 0–5)

## 2015-07-11 LAB — URINALYSIS, ROUTINE W REFLEX MICROSCOPIC
Bilirubin Urine: NEGATIVE
Glucose, UA: NEGATIVE mg/dL
Ketones, ur: NEGATIVE mg/dL
Leukocytes, UA: NEGATIVE
NITRITE: NEGATIVE
PROTEIN: NEGATIVE mg/dL
pH: 6 (ref 5.0–8.0)

## 2015-07-11 LAB — TROPONIN I

## 2015-07-11 LAB — LIPASE, BLOOD: Lipase: 27 U/L (ref 11–51)

## 2015-07-11 MED ORDER — SODIUM CHLORIDE 0.9 % IV BOLUS (SEPSIS)
1000.0000 mL | Freq: Once | INTRAVENOUS | Status: AC
  Administered 2015-07-11: 1000 mL via INTRAVENOUS

## 2015-07-11 MED ORDER — MORPHINE SULFATE (PF) 4 MG/ML IV SOLN
4.0000 mg | Freq: Once | INTRAVENOUS | Status: AC
  Administered 2015-07-11: 4 mg via INTRAVENOUS
  Filled 2015-07-11: qty 1

## 2015-07-11 MED ORDER — DIPHENHYDRAMINE HCL 50 MG/ML IJ SOLN
12.5000 mg | Freq: Once | INTRAMUSCULAR | Status: AC
  Administered 2015-07-11: 12.5 mg via INTRAVENOUS
  Filled 2015-07-11: qty 1

## 2015-07-11 MED ORDER — KETOROLAC TROMETHAMINE 30 MG/ML IJ SOLN
15.0000 mg | Freq: Once | INTRAMUSCULAR | Status: AC
  Administered 2015-07-11: 15 mg via INTRAVENOUS
  Filled 2015-07-11: qty 1

## 2015-07-11 MED ORDER — METOCLOPRAMIDE HCL 5 MG/ML IJ SOLN
10.0000 mg | Freq: Once | INTRAMUSCULAR | Status: AC
  Administered 2015-07-11: 10 mg via INTRAVENOUS
  Filled 2015-07-11: qty 2

## 2015-07-11 MED ORDER — HYDROCODONE-ACETAMINOPHEN 5-325 MG PO TABS: 1.0000 | ORAL_TABLET | ORAL | Status: DC | PRN

## 2015-07-11 MED ORDER — HYDROCODONE-ACETAMINOPHEN 5-325 MG PO TABS
2.0000 | ORAL_TABLET | Freq: Once | ORAL | Status: AC
  Administered 2015-07-11: 2 via ORAL
  Filled 2015-07-11: qty 2

## 2015-07-11 NOTE — ED Provider Notes (Addendum)
  Face-to-face evaluation   History: She presents for abdominal and back pain. The problem is recurrent. She was evaluated in the ED about 48 hours ago with similar problem. At that time she had a comprehensive evaluation. She states that she is out of her narcotic medication because she used to much of it because of her foot pain.  Physical exam: Alert, anxious, ambulatory. She is lucid. She is not dysarthric or aphasic.  Medical screening examination/treatment/procedure(s) were conducted as a shared visit with non-physician practitioner(s) and myself.  I personally evaluated the patient during the encounter   Daleen Bo, MD 07/12/15 2359

## 2015-07-11 NOTE — ED Notes (Signed)
Pt presents to ED with complaints of lower abdominal pain since Friday.  Pt was seen Friday for same.  Pt states she is having N/V/D since Friday as well.  Pt is A&Ox4.

## 2015-07-11 NOTE — ED Notes (Signed)
Pt discharged per MD instruct- Encouraged to speak to narcotic prescribing physician about running out of pain meds early. Pt agitated in the department moving in an agitated manner, complaining of abd pain, then back pain, then foot pain. She was unable to stay still for temp, and VS

## 2015-07-11 NOTE — ED Notes (Addendum)
Pt report pain improved from back pain then request pain medicine for her feet reports neuropathy- she is agitated and cannot keep the thermometer in her mouth and is moving restlessly

## 2015-07-11 NOTE — Discharge Instructions (Signed)
Abdominal Pain, Adult Many things can cause abdominal pain. Usually, abdominal pain is not caused by a disease and will improve without treatment. It can often be observed and treated at home. Your health care provider will do a physical exam and possibly order blood tests and X-rays to help determine the seriousness of your pain. However, in many cases, more time must pass before a clear cause of the pain can be found. Before that point, your health care provider may not know if you need more testing or further treatment. HOME CARE INSTRUCTIONS Monitor your abdominal pain for any changes. The following actions may help to alleviate any discomfort you are experiencing:  Only take over-the-counter or prescription medicines as directed by your health care provider.  Do not take laxatives unless directed to do so by your health care provider.  Try a clear liquid diet (broth, tea, or water) as directed by your health care provider. Slowly move to a bland diet as tolerated. SEEK MEDICAL CARE IF:  You have unexplained abdominal pain.  You have abdominal pain associated with nausea or diarrhea.  You have pain when you urinate or have a bowel movement.  You experience abdominal pain that wakes you in the night.  You have abdominal pain that is worsened or improved by eating food.  You have abdominal pain that is worsened with eating fatty foods.  You have a fever. SEEK IMMEDIATE MEDICAL CARE IF:  Your pain does not go away within 2 hours.  You keep throwing up (vomiting).  Your pain is felt only in portions of the abdomen, such as the right side or the left lower portion of the abdomen.  You pass bloody or black tarry stools. MAKE SURE YOU:  Understand these instructions.  Will watch your condition.  Will get help right away if you are not doing well or get worse.   This information is not intended to replace advice given to you by your health care provider. Make sure you discuss  any questions you have with your health care provider.   Document Released: 12/21/2004 Document Revised: 12/02/2014 Document Reviewed: 11/20/2012 Elsevier Interactive Patient Education 2016 [place] may take the hydrocodone prescribed for pain relief.  This will make you drowsy - do not drive within 4 hours of taking this medication.

## 2015-07-11 NOTE — ED Notes (Signed)
Physician in to speak with pt

## 2015-07-11 NOTE — ED Notes (Addendum)
Pt reports is out of her pain meds- narcotics

## 2015-07-11 NOTE — ED Notes (Signed)
Pt complains of back pain states that she takes oxycodone for her pain and that she has a stimulator that is not working

## 2015-07-11 NOTE — ED Notes (Signed)
PA at bedside.

## 2015-07-11 NOTE — ED Provider Notes (Signed)
CSN: [id number]     Arrival date & time 07/11/15  1740 History  By signing my name below, I, Rebecca Richards, attest that this documentation has been prepared under the direction and in the presence of [employer], PA-C. Electronically Signed: Randa Richards, ED Scribe. 07/11/2015. 6:10 PM.     Chief Complaint  Patient presents with  . Abdominal Pain   The history is provided by the patient. No language interpreter was used.   HPI Comments: Rebecca Richards is a 70 y.o. female who presents to the Emergency Department complaining of new, sharp upper abdominal pain onset 2 days prior. Pt reports associated nausea, vomiting x2 and diarrhea x3. Pt is also complaining of low back pain. PT states she is not able to tolerate PO and solid foods. Pt states she was evaluated here in the ED 2 days ago for the same pain at which time labs and CT of her abdomen/pelvis were stable. Pt states she was prescribed zofran but has not had any relief. Denies blood in her stool, fever, dysuria, hematuria or SOB. Hx of cholecystectomy and abdominal hysterectomy.  Past Medical History  Diagnosis Date  . Brain tumor [facility])     Surgery for benign brain tumor in the remote past  . Hypertension   . Preop cardiovascular exam     Cardiac clearance for knee surgery, January, 2013  . Edema   . Tobacco abuse   . Ejection fraction     EF 70%, echo, February, 2013  . Sleep apnea     uses Cpap  . Hepatitis 1970's    hep c  . Anxiety   . 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 Nasal congestion   . Diabetes mellitus without complication ([place])   . Insomnia   . Spinal cord stimulator status     for pain   . Chronic knee pain   . Chronic back pain     with radiculopathy bilat legs  . Depression   . Arthritis   . 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 Abdominal hysterectomy    . Knee  surgery    . Nose surgery    . Back surgery      pt has stimulator in lower back and can't have an MRI  . Total knee arthroplasty  07/21/2011    Procedure: TOTAL KNEE ARTHROPLASTY;  Surgeon: Yvette Rack., MD;  Location: [facility];  Service: Orthopedics;  Laterality: Left;  . 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 Cholecystectomy N/A 09/30/2012    Procedure: LAPAROSCOPIC CHOLECYSTECTOMY;  Surgeon: Donato Heinz, MD;  Location: [facility];  Service: General;  Laterality: N/A;   Family History  Problem Relation Age of Onset  . Cancer Mother   . Hypertension Mother   . Anesthesia problems Neg Hx   . Diabetes Mother   . Diabetes Brother    Social History  Substance Use Topics  . Smoking status: Former Smoker -- 0.50 packs/day for 10 years    Types: Cigarettes    Quit date: 12/26/2013  . Smokeless tobacco: Never Used  . Alcohol Use: No     Comment: none since 03-28-2011   OB History    Gravida Para Term Preterm AB TAB SAB Ectopic Multiple Living   1  1 1       1      Review of Systems  Constitutional: Negative for fever.  Respiratory: Negative for shortness of breath.   Gastrointestinal: Positive for nausea, vomiting, abdominal pain and diarrhea.  All other systems reviewed and are negative.     Allergies  Review of patient's allergies indicates no known allergies.  Home Medications   Prior to Admission medications   Medication Sig Start Date End Date Taking? Authorizing Provider  hydrochlorothiazide (MICROZIDE) 12.5 MG capsule Take 1 capsule by mouth daily. 04/19/15  Yes Historical Provider, MD  ibuprofen (ADVIL,MOTRIN) 200 MG tablet Take 200 mg by mouth every 6 (six) hours as needed for mild pain or moderate pain.   Yes Historical Provider, MD  Insulin Detemir (LEVEMIR FLEXTOUCH) 100 UNIT/ML Pen Inject 20 Units into the skin daily at 10 pm.   Yes Historical Provider, MD  metFORMIN (GLUCOPHAGE) 500 MG tablet Take 500 mg by mouth 2 (two) times daily.   Yes Historical Provider,  MD  Omega-3 Fatty Acids (FISH OIL) 1000 MG CAPS Take 1 capsule by mouth daily.   Yes Historical Provider, MD  ondansetron (ZOFRAN ODT) 8 MG disintegrating tablet Take 1 tablet (8 mg total) by mouth every 8 (eight) hours as needed for nausea or vomiting. 07/10/15  Yes Jola Schmidt, MD  pregabalin (LYRICA) 50 MG capsule Take 100-150 mg by mouth 2 (two) times daily. 100 mg in the morning and 150 at bedtime   Yes Historical Provider, MD  telmisartan (MICARDIS) 80 MG tablet Take 80 mg by mouth daily. 04/19/15  Yes Historical Provider, MD  HYDROcodone-acetaminophen (NORCO/VICODIN) 5-325 MG tablet Take 1 tablet by mouth every 4 (four) hours as needed. 07/11/15   Evalee Jefferson, PA-C   BP 163/70 mmHg  Pulse 85  Temp(Src) 99.7 F (37.6 C) (Tympanic)  Resp 24  Ht 5\' 1"  (1.549 m)  Wt 73.936 kg  BMI 30.81 kg/m2  SpO2 100%   Physical Exam  Constitutional: She is oriented to person, place, and time. She appears well-developed and well-nourished. No distress.  Pt writhing in pain during initial exam making exam challenging   HENT:  Head: Normocephalic and atraumatic.  Mouth/Throat: Oropharynx is clear and moist.  Buccal mucosal moist  Eyes: Conjunctivae and EOM are normal.  Neck: Neck supple. No tracheal deviation present.  Cardiovascular: Normal rate, regular rhythm and normal heart sounds.   No murmur heard. Pulmonary/Chest: Effort normal. No respiratory distress. She has no wheezes.  Abdominal: She exhibits no distension and no mass. There is tenderness in the right upper quadrant and epigastric area.  TTP in epigastric and RUQ without guarding. Normal bowel sounds. Adipose abdomen. No increased tympany.   Musculoskeletal: Normal range of motion.  Midline and bilateral lumbar TTP  Neurological: She is alert and oriented to person, place, and time.  Skin: Skin is warm and dry.  Psychiatric: She has a normal mood and affect. Her behavior is normal.  Nursing note and vitals reviewed.   ED Course   Procedures (including critical care time) DIAGNOSTIC STUDIES: Oxygen Saturation is 100% on RA, normal by my interpretation.    COORDINATION OF CARE: 6:05 PM-Discussed treatment plan with pt at bedside and pt agreed to plan.     Labs Review Labs Reviewed  COMPREHENSIVE METABOLIC PANEL - Abnormal; Notable for the following:    Potassium 3.3 (*)    Calcium 8.8 (*)    ALT 13 (*)    GFR calc non Af Amer 58 (*)  All other components within normal limits  CBC - Abnormal; Notable for the following:    Hemoglobin 11.3 (*)    HCT 35.7 (*)    RDW 15.7 (*)    All other components within normal limits  URINALYSIS, ROUTINE W REFLEX MICROSCOPIC (NOT AT [facility]) - Abnormal; Notable for the following:    Specific Gravity, Urine <1.005 (*)    Hgb urine dipstick TRACE (*)    All other components within normal limits  URINE MICROSCOPIC-ADD ON - Abnormal; Notable for the following:    Squamous Epithelial / LPF 0-5 (*)    All other components within normal limits  LIPASE, BLOOD  TROPONIN I    Imaging Review No results found. I have personally reviewed and evaluated these images and lab results as part of my medical decision-making.   EKG Interpretation   Date/Time:  Sunday July 11 2015 19:48:46 EDT Ventricular Rate:  78 PR Interval:  138 QRS Duration: 86 QT Interval:  413 QTC Calculation: 470 R Axis:   40 Text Interpretation:  Sinus rhythm since last tracing no significant  change Confirmed by Eulis Foster  MD, Vira Agar [id number]) on 07/11/2015 11:05:07 PM      MDM   Final diagnoses:  Abdominal pain, unspecified abdominal location  Chronic back pain    Pt given reglan, morphine and benadryl injections, abdominal pain resolved, still with c/o low back pain (which is chronic problem). She has a low back pain stimulator implant which has stopped working. Discussed with Dr. Eulis Foster who also saw pt prior to dc.  Pt revealed to him that she ran out of her chronic narcotic pain medicine as she has  increased pain from her diabetic neuropathy in her feet.  She was on gabapentin which controlled her pain, recent switch to lyrica has caused increased pain problems.  Pt with suspected narcotic withdrawal at this time.  She was prescribed a small quantity of hydrocodone to help reduce withdrawal sx.  Advised she needs f/u with her pcp early this week for further management of her chronic pain.   I personally performed the services described in this documentation, which was scribed in my presence. The recorded information has been reviewed and is accurate.     Evalee Jefferson, PA-C 07/12/15 [place], MD 07/12/15 [phone]

## 2015-07-15 ENCOUNTER — Other Ambulatory Visit: Payer: Self-pay | Admitting: Orthopaedic Surgery

## 2015-07-15 ENCOUNTER — Telehealth: Payer: Self-pay | Admitting: Orthopaedic Surgery

## 2015-07-15 MED ORDER — HYDROCODONE-ACETAMINOPHEN 7.5-325 MG PO TABS: ORAL_TABLET | ORAL | Status: DC

## 2015-07-15 MED ORDER — HYDROCODONE-ACETAMINOPHEN 5-325 MG PO TABS: 1.0000 | ORAL_TABLET | ORAL | Status: DC | PRN

## 2015-07-15 NOTE — Telephone Encounter (Signed)
Patient called and requested a refill on Hydrocodone 5-325 mgs.  Last filled on 07-15-15

## 2015-07-15 NOTE — Telephone Encounter (Signed)
Rx done. 

## 2015-08-07 ENCOUNTER — Emergency Department (HOSPITAL_COMMUNITY)
Admission: EM | Admit: 2015-08-07 | Discharge: 2015-08-07 | Disposition: A | Discharge status: Home / Self Care | Payer: Medicare HMO | Attending: Emergency Medicine | Admitting: Emergency Medicine

## 2015-08-07 ENCOUNTER — Encounter (HOSPITAL_COMMUNITY): Payer: Self-pay | Admitting: *Deleted

## 2015-08-07 DIAGNOSIS — Z87891 Personal history of nicotine dependence: Secondary | ICD-10-CM | POA: Diagnosis not present

## 2015-08-07 DIAGNOSIS — E119 Type 2 diabetes mellitus without complications: Secondary | ICD-10-CM | POA: Diagnosis not present

## 2015-08-07 DIAGNOSIS — M79605 Pain in left leg: Secondary | ICD-10-CM | POA: Insufficient documentation

## 2015-08-07 DIAGNOSIS — Z79899 Other long term (current) drug therapy: Secondary | ICD-10-CM | POA: Insufficient documentation

## 2015-08-07 DIAGNOSIS — Z794 Long term (current) use of insulin: Secondary | ICD-10-CM | POA: Diagnosis not present

## 2015-08-07 DIAGNOSIS — F329 Major depressive disorder, single episode, unspecified: Secondary | ICD-10-CM | POA: Diagnosis not present

## 2015-08-07 DIAGNOSIS — M545 Low back pain, unspecified: Secondary | ICD-10-CM

## 2015-08-07 DIAGNOSIS — Z7984 Long term (current) use of oral hypoglycemic drugs: Secondary | ICD-10-CM | POA: Insufficient documentation

## 2015-08-07 DIAGNOSIS — Z85528 Personal history of other malignant neoplasm of kidney: Secondary | ICD-10-CM | POA: Diagnosis not present

## 2015-08-07 DIAGNOSIS — M1990 Unspecified osteoarthritis, unspecified site: Secondary | ICD-10-CM | POA: Diagnosis not present

## 2015-08-07 DIAGNOSIS — Z86011 Personal history of benign neoplasm of the brain: Secondary | ICD-10-CM | POA: Insufficient documentation

## 2015-08-07 DIAGNOSIS — I10 Essential (primary) hypertension: Secondary | ICD-10-CM | POA: Diagnosis not present

## 2015-08-07 MED ORDER — HYDROMORPHONE HCL 1 MG/ML IJ SOLN
1.0000 mg | Freq: Once | INTRAMUSCULAR | Status: AC
  Administered 2015-08-07: 1 mg via INTRAMUSCULAR
  Filled 2015-08-07: qty 1

## 2015-08-07 MED ORDER — OXYCODONE-ACETAMINOPHEN 5-325 MG PO TABS: 1.0000 | ORAL_TABLET | ORAL | Status: DC | PRN

## 2015-08-07 NOTE — ED Provider Notes (Addendum)
CSN: [id number]     Arrival date & time 08/07/15  [id number] History  By signing my name below, I, Emmanuella Mensah, attest that this documentation has been prepared under the direction and in the presence of Nat Christen, MD. Electronically Signed: Judithann Sauger, ED Scribe. 08/07/2015. 8:16 AM.    Chief Complaint  Patient presents with  . Back Pain   The history is provided by the patient. No language interpreter was used.   HPI Comments: Rebecca Richards is a 70 y.o. female who presents to the Emergency Department complaining of gradually worsening severe left lower back pain that radiates to her left leg onset this am after bending over. No alleviating factors noted. Pt denies any bowel/bladder incontinence.This has happened before. Patient has known diabetic neuropathy.   Past Medical History  Diagnosis Date  . Brain tumor [facility])     Surgery for benign brain tumor in the remote past  . Hypertension   . Preop cardiovascular exam     Cardiac clearance for knee surgery, January, 2013  . Edema   . Tobacco abuse   . Ejection fraction     EF 70%, echo, February, 2013  . Sleep apnea     uses Cpap  . Hepatitis 1970's    hep c  . Anxiety   . 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 Nasal congestion   . Diabetes mellitus without complication ([place])   . Insomnia   . Spinal cord stimulator status     for pain   . Chronic knee pain   . Chronic back pain     with radiculopathy bilat legs  . Depression   . Arthritis   . 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 Abdominal hysterectomy    . Knee surgery    . Nose surgery    . Back surgery      pt has stimulator in lower back and can't have an MRI  . Total knee arthroplasty  07/21/2011    Procedure: TOTAL KNEE ARTHROPLASTY;  Surgeon: Yvette Rack., MD;  Location: [facility];  Service: Orthopedics;  Laterality: Left;   . 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 Cholecystectomy N/A 09/30/2012    Procedure: LAPAROSCOPIC CHOLECYSTECTOMY;  Surgeon: Donato Heinz, MD;  Location: [facility];  Service: General;  Laterality: N/A;   Family History  Problem Relation Age of Onset  . Cancer Mother   . Hypertension Mother   . Anesthesia problems Neg Hx   . Diabetes Mother   . Diabetes Brother    Social History  Substance Use Topics  . Smoking status: Former Smoker -- 0.50 packs/day for 10 years    Types: Cigarettes    Quit date: 12/26/2013  . Smokeless tobacco: Never Used  . Alcohol Use: No     Comment: none since 03-28-2011   OB History    Gravida Para Term Preterm AB TAB SAB Ectopic Multiple Living   1 1 1       1      Review of Systems  Musculoskeletal: Positive for back pain and arthralgias.  All other systems reviewed and are negative.     Allergies  Review of patient's allergies indicates no known allergies.  Home Medications   Prior to Admission medications  Medication Sig Start Date End Date Taking? Authorizing Provider  hydrochlorothiazide (MICROZIDE) 12.5 MG capsule Take 1 capsule by mouth daily. 04/19/15  Yes Historical Provider, MD  HYDROcodone-acetaminophen (NORCO) 7.5-325 MG tablet One tablet every four hours as needed for pain.  Do not drive car or operate machinery.  Must last 30 days. 07/15/15  Yes Sanjuana Kava, MD  ibuprofen (ADVIL,MOTRIN) 200 MG tablet Take 200 mg by mouth every 6 (six) hours as needed for mild pain or moderate pain.   Yes Historical Provider, MD  Insulin Detemir (LEVEMIR FLEXTOUCH) 100 UNIT/ML Pen Inject 20 Units into the skin daily at 10 pm.   Yes Historical Provider, MD  metFORMIN (GLUCOPHAGE) 500 MG tablet Take 500 mg by mouth 2 (two) times daily.   Yes Historical Provider, MD  Omega-3 Fatty Acids (FISH OIL) 1000 MG CAPS Take 1 capsule by mouth daily.   Yes Historical Provider, MD  telmisartan (MICARDIS) 80 MG tablet Take 80 mg by mouth daily. 04/19/15  Yes  Historical Provider, MD  ondansetron (ZOFRAN ODT) 8 MG disintegrating tablet Take 1 tablet (8 mg total) by mouth every 8 (eight) hours as needed for nausea or vomiting. 07/10/15   Jola Schmidt, MD  oxyCODONE-acetaminophen (PERCOCET) 5-325 MG tablet Take 1-2 tablets by mouth every 4 (four) hours as needed. 08/07/15   Nat Christen, MD  pregabalin (LYRICA) 50 MG capsule Take 100-150 mg by mouth 2 (two) times daily. 100 mg in the morning and 150 at bedtime    Historical Provider, MD   BP 141/102 mmHg  Pulse 60  Temp(Src) 98 F (36.7 C) (Oral)  Resp 20  Ht 5\' 1"  (1.549 m)  Wt 163 lb (73.936 kg)  BMI 30.81 kg/m2  SpO2 97% Physical Exam  Constitutional: She is oriented to person, place, and time. She appears well-developed and well-nourished.  HENT:  Head: Normocephalic and atraumatic.  Eyes: Conjunctivae and EOM are normal. Pupils are equal, round, and reactive to light.  Neck: Normal range of motion. Neck supple.  Cardiovascular: Normal rate and regular rhythm.   Pulmonary/Chest: Effort normal and breath sounds normal.  Abdominal: Soft. Bowel sounds are normal.  Musculoskeletal: Normal range of motion.  Tender to L4-L5 to the left Pain with straight leg raise in left  Neurological: She is alert and oriented to person, place, and time.  Skin: Skin is warm and dry.  Psychiatric: She has a normal mood and affect. Her behavior is normal.  Nursing note and vitals reviewed.   ED Course  Procedures (including critical care time) DIAGNOSTIC STUDIES: Oxygen Saturation is 97% on RA, normal by my interpretation.    COORDINATION OF CARE: 8:14 AM- Pt advised of plan for treatment and pt agrees. Pt will receive pain medication.    Labs Review Labs Reviewed - No data to display  Imaging Review No results found.   Nat Christen, MD has personally reviewed and evaluated these images and lab results as part of his medical decision-making.   EKG Interpretation None      MDM   Final diagnoses:   Low back pain radiating to left lower extremity   This is not a new problem. No bowel or bladder incontinence. Rx intramuscular Dilaudid. Discharge medication Percocet. Patient has primary care follow-up.  I personally performed the services described in this documentation, which was scribed in my presence. The recorded information has been reviewed and is accurate.    Nat Christen, MD 08/07/15 [id number]  Nat Christen, MD 08/07/15 [phone]

## 2015-08-07 NOTE — Discharge Instructions (Signed)
Medication for pain.  Follow up your dr

## 2015-08-07 NOTE — ED Notes (Signed)
Pt states that she bent over this am to pick up silverware when her back "went out", c/o lower back pain that radiates down both legs,

## 2015-08-12 ENCOUNTER — Telehealth: Payer: Self-pay | Admitting: Orthopaedic Surgery

## 2015-08-12 MED ORDER — HYDROCODONE-ACETAMINOPHEN 7.5-325 MG PO TABS: ORAL_TABLET | ORAL | Status: AC

## 2015-08-12 NOTE — Telephone Encounter (Signed)
Rx done. 

## 2015-08-12 NOTE — Telephone Encounter (Signed)
Patient called and requested a refill on Hydrocodone-Acetaminophen 7.5-325 mgs.  Qty 120    Sig: One tablet every four hours as needed for pain. Do not drive car or operate machinery. Must last 30 days.

## 2015-09-08 ENCOUNTER — Encounter (HOSPITAL_COMMUNITY): Payer: Self-pay

## 2015-09-08 ENCOUNTER — Inpatient Hospital Stay (HOSPITAL_COMMUNITY)
Admission: EM | Admit: 2015-09-08 | Discharge: 2015-10-26 | DRG: 004 | Disposition: E | Payer: Medicare HMO | Attending: Pulmonary Disease | Admitting: Pulmonary Disease

## 2015-09-08 ENCOUNTER — Inpatient Hospital Stay (HOSPITAL_COMMUNITY): Payer: Medicare HMO | Admitting: Anesthesiology

## 2015-09-08 ENCOUNTER — Emergency Department (HOSPITAL_COMMUNITY): Payer: Medicare HMO

## 2015-09-08 ENCOUNTER — Inpatient Hospital Stay (HOSPITAL_COMMUNITY): Payer: Medicare HMO

## 2015-09-08 DIAGNOSIS — M545 Low back pain: Secondary | ICD-10-CM | POA: Diagnosis present

## 2015-09-08 DIAGNOSIS — Y95 Nosocomial condition: Secondary | ICD-10-CM | POA: Diagnosis present

## 2015-09-08 DIAGNOSIS — R6521 Severe sepsis with septic shock: Secondary | ICD-10-CM | POA: Diagnosis present

## 2015-09-08 DIAGNOSIS — J15212 Pneumonia due to Methicillin resistant Staphylococcus aureus: Secondary | ICD-10-CM | POA: Diagnosis not present

## 2015-09-08 DIAGNOSIS — Z85528 Personal history of other malignant neoplasm of kidney: Secondary | ICD-10-CM | POA: Diagnosis not present

## 2015-09-08 DIAGNOSIS — Z86011 Personal history of benign neoplasm of the brain: Secondary | ICD-10-CM | POA: Diagnosis not present

## 2015-09-08 DIAGNOSIS — G9341 Metabolic encephalopathy: Secondary | ICD-10-CM | POA: Diagnosis not present

## 2015-09-08 DIAGNOSIS — F419 Anxiety disorder, unspecified: Secondary | ICD-10-CM | POA: Diagnosis present

## 2015-09-08 DIAGNOSIS — Z6830 Body mass index (BMI) 30.0-30.9, adult: Secondary | ICD-10-CM

## 2015-09-08 DIAGNOSIS — L8990 Pressure ulcer of unspecified site, unspecified stage: Secondary | ICD-10-CM | POA: Insufficient documentation

## 2015-09-08 DIAGNOSIS — R0902 Hypoxemia: Secondary | ICD-10-CM

## 2015-09-08 DIAGNOSIS — R7989 Other specified abnormal findings of blood chemistry: Secondary | ICD-10-CM

## 2015-09-08 DIAGNOSIS — Z809 Family history of malignant neoplasm, unspecified: Secondary | ICD-10-CM

## 2015-09-08 DIAGNOSIS — E872 Acidosis, unspecified: Secondary | ICD-10-CM | POA: Diagnosis present

## 2015-09-08 DIAGNOSIS — G629 Polyneuropathy, unspecified: Secondary | ICD-10-CM | POA: Diagnosis present

## 2015-09-08 DIAGNOSIS — Z794 Long term (current) use of insulin: Secondary | ICD-10-CM

## 2015-09-08 DIAGNOSIS — N179 Acute kidney failure, unspecified: Secondary | ICD-10-CM | POA: Diagnosis not present

## 2015-09-08 DIAGNOSIS — Q249 Congenital malformation of heart, unspecified: Secondary | ICD-10-CM

## 2015-09-08 DIAGNOSIS — G4733 Obstructive sleep apnea (adult) (pediatric): Secondary | ICD-10-CM | POA: Diagnosis not present

## 2015-09-08 DIAGNOSIS — E669 Obesity, unspecified: Secondary | ICD-10-CM | POA: Diagnosis present

## 2015-09-08 DIAGNOSIS — Z4659 Encounter for fitting and adjustment of other gastrointestinal appliance and device: Secondary | ICD-10-CM

## 2015-09-08 DIAGNOSIS — Z9989 Dependence on other enabling machines and devices: Secondary | ICD-10-CM

## 2015-09-08 DIAGNOSIS — I10 Essential (primary) hypertension: Secondary | ICD-10-CM | POA: Diagnosis present

## 2015-09-08 DIAGNOSIS — F22 Delusional disorders: Secondary | ICD-10-CM

## 2015-09-08 DIAGNOSIS — Z720 Tobacco use: Secondary | ICD-10-CM | POA: Diagnosis present

## 2015-09-08 DIAGNOSIS — K5900 Constipation, unspecified: Secondary | ICD-10-CM | POA: Diagnosis not present

## 2015-09-08 DIAGNOSIS — F410 Panic disorder [episodic paroxysmal anxiety] without agoraphobia: Secondary | ICD-10-CM | POA: Diagnosis present

## 2015-09-08 DIAGNOSIS — E860 Dehydration: Secondary | ICD-10-CM | POA: Diagnosis present

## 2015-09-08 DIAGNOSIS — J9601 Acute respiratory failure with hypoxia: Secondary | ICD-10-CM

## 2015-09-08 DIAGNOSIS — Z66 Do not resuscitate: Secondary | ICD-10-CM

## 2015-09-08 DIAGNOSIS — M1990 Unspecified osteoarthritis, unspecified site: Secondary | ICD-10-CM | POA: Diagnosis present

## 2015-09-08 DIAGNOSIS — J9690 Respiratory failure, unspecified, unspecified whether with hypoxia or hypercapnia: Secondary | ICD-10-CM

## 2015-09-08 DIAGNOSIS — Z7984 Long term (current) use of oral hypoglycemic drugs: Secondary | ICD-10-CM

## 2015-09-08 DIAGNOSIS — E870 Hyperosmolality and hypernatremia: Secondary | ICD-10-CM | POA: Diagnosis present

## 2015-09-08 DIAGNOSIS — B379 Candidiasis, unspecified: Secondary | ICD-10-CM | POA: Diagnosis not present

## 2015-09-08 DIAGNOSIS — T17990A Other foreign object in respiratory tract, part unspecified in causing asphyxiation, initial encounter: Secondary | ICD-10-CM | POA: Diagnosis not present

## 2015-09-08 DIAGNOSIS — J189 Pneumonia, unspecified organism: Secondary | ICD-10-CM | POA: Diagnosis not present

## 2015-09-08 DIAGNOSIS — J939 Pneumothorax, unspecified: Secondary | ICD-10-CM | POA: Diagnosis not present

## 2015-09-08 DIAGNOSIS — Z79899 Other long term (current) drug therapy: Secondary | ICD-10-CM | POA: Diagnosis not present

## 2015-09-08 DIAGNOSIS — K567 Ileus, unspecified: Secondary | ICD-10-CM

## 2015-09-08 DIAGNOSIS — E1165 Type 2 diabetes mellitus with hyperglycemia: Secondary | ICD-10-CM | POA: Diagnosis present

## 2015-09-08 DIAGNOSIS — IMO0002 Reserved for concepts with insufficient information to code with codable children: Secondary | ICD-10-CM | POA: Diagnosis present

## 2015-09-08 DIAGNOSIS — G8929 Other chronic pain: Secondary | ICD-10-CM | POA: Diagnosis present

## 2015-09-08 DIAGNOSIS — E876 Hypokalemia: Secondary | ICD-10-CM | POA: Diagnosis present

## 2015-09-08 DIAGNOSIS — Z833 Family history of diabetes mellitus: Secondary | ICD-10-CM

## 2015-09-08 DIAGNOSIS — A419 Sepsis, unspecified organism: Secondary | ICD-10-CM | POA: Diagnosis not present

## 2015-09-08 DIAGNOSIS — Z96652 Presence of left artificial knee joint: Secondary | ICD-10-CM

## 2015-09-08 DIAGNOSIS — D6489 Other specified anemias: Secondary | ICD-10-CM | POA: Diagnosis present

## 2015-09-08 DIAGNOSIS — R0602 Shortness of breath: Secondary | ICD-10-CM | POA: Diagnosis present

## 2015-09-08 DIAGNOSIS — Z515 Encounter for palliative care: Secondary | ICD-10-CM | POA: Diagnosis present

## 2015-09-08 DIAGNOSIS — J9600 Acute respiratory failure, unspecified whether with hypoxia or hypercapnia: Secondary | ICD-10-CM

## 2015-09-08 DIAGNOSIS — Z8249 Family history of ischemic heart disease and other diseases of the circulatory system: Secondary | ICD-10-CM

## 2015-09-08 DIAGNOSIS — J95811 Postprocedural pneumothorax: Secondary | ICD-10-CM | POA: Diagnosis not present

## 2015-09-08 DIAGNOSIS — E1140 Type 2 diabetes mellitus with diabetic neuropathy, unspecified: Secondary | ICD-10-CM | POA: Diagnosis not present

## 2015-09-08 DIAGNOSIS — D649 Anemia, unspecified: Secondary | ICD-10-CM | POA: Diagnosis present

## 2015-09-08 DIAGNOSIS — B1920 Unspecified viral hepatitis C without hepatic coma: Secondary | ICD-10-CM | POA: Diagnosis present

## 2015-09-08 DIAGNOSIS — Z87891 Personal history of nicotine dependence: Secondary | ICD-10-CM

## 2015-09-08 DIAGNOSIS — I9589 Other hypotension: Secondary | ICD-10-CM | POA: Diagnosis not present

## 2015-09-08 DIAGNOSIS — J80 Acute respiratory distress syndrome: Secondary | ICD-10-CM | POA: Diagnosis not present

## 2015-09-08 DIAGNOSIS — F32A Depression, unspecified: Secondary | ICD-10-CM | POA: Diagnosis present

## 2015-09-08 DIAGNOSIS — F329 Major depressive disorder, single episode, unspecified: Secondary | ICD-10-CM | POA: Diagnosis present

## 2015-09-08 LAB — BLOOD GAS, ARTERIAL
ACID-BASE DEFICIT: 16.9 mmol/L — AB (ref 0.0–2.0)
Acid-base deficit: 12.9 mmol/L — ABNORMAL HIGH (ref 0.0–2.0)
Acid-base deficit: 12.3 mmol/L — ABNORMAL HIGH (ref 0.0–2.0)
Bicarbonate: 12 mEq/L — ABNORMAL LOW (ref 20.0–24.0)
Bicarbonate: 15.1 meq/L — ABNORMAL LOW (ref 20.0–24.0)
Bicarbonate: 14.5 mEq/L — ABNORMAL LOW (ref 20.0–24.0)
Delivery systems: POSITIVE
Delivery systems: POSITIVE
Drawn by: 234301
EXPIRATORY PAP: 6
Expiratory PAP: 6
FIO2: 50
FIO2: 55
FIO2: 100
INSPIRATORY PAP: 12
Inspiratory PAP: 12
LHR: 12 {breaths}/min
Mode: POSITIVE
O2 SAT: 91.7 %
O2 SAT: 89.7 %
O2 Saturation: 92.3 %
PCO2 ART: 22.5 mmHg — AB (ref 35.0–45.0)
PCO2 ART: 37.5 mmHg (ref 35.0–45.0)
PEEP: 5 cmH2O
PH ART: 7.236 — AB (ref 7.350–7.450)
PO2 ART: 79.8 mmHg — AB (ref 80.0–100.0)
VT: 430 mL
pCO2 arterial: 25.1 mmHg — ABNORMAL LOW (ref 35.0–45.0)
pH, Arterial: 7.307 — ABNORMAL LOW (ref 7.350–7.450)
pH, Arterial: 7.201 — ABNORMAL LOW (ref 7.350–7.450)
pO2, Arterial: 75.6 mmHg — ABNORMAL LOW (ref 80.0–100.0)
pO2, Arterial: 73.2 mmHg — ABNORMAL LOW (ref 80.0–100.0)

## 2015-09-08 LAB — URINE MICROSCOPIC-ADD ON

## 2015-09-08 LAB — HEPATIC FUNCTION PANEL
ALBUMIN: 3.5 g/dL (ref 3.5–5.0)
ALK PHOS: 79 U/L (ref 38–126)
ALT: 17 U/L (ref 14–54)
AST: 72 U/L — AB (ref 15–41)
Bilirubin, Direct: <0.1 mg/dL — ABNORMAL LOW (ref 0.1–0.5)
TOTAL PROTEIN: 7.7 g/dL (ref 6.5–8.1)
Total Bilirubin: 0.6 mg/dL (ref 0.3–1.2)

## 2015-09-08 LAB — MRSA PCR SCREENING: MRSA by PCR: POSITIVE — AB

## 2015-09-08 LAB — LACTIC ACID, PLASMA
Lactic Acid, Venous: 1.2 mmol/L (ref 0.5–2.0)
Lactic Acid, Venous: 1 mmol/L (ref 0.5–2.0)

## 2015-09-08 LAB — CBC
HCT: 38 % (ref 36.0–46.0)
HEMOGLOBIN: 12.7 g/dL (ref 12.0–15.0)
MCH: 28.2 pg (ref 26.0–34.0)
MCHC: 33.4 g/dL (ref 30.0–36.0)
MCV: 84.4 fL (ref 78.0–100.0)
PLATELETS: 375 10*3/uL (ref 150–400)
RBC: 4.5 MIL/uL (ref 3.87–5.11)
RDW: 18.1 % — ABNORMAL HIGH (ref 11.5–15.5)
WBC: 17.6 10*3/uL — ABNORMAL HIGH (ref 4.0–10.5)

## 2015-09-08 LAB — BASIC METABOLIC PANEL
ANION GAP: 15 (ref 5–15)
BUN: 36 mg/dL — AB (ref 6–20)
CALCIUM: 8.2 mg/dL — AB (ref 8.9–10.3)
CO2: 11 mmol/L — ABNORMAL LOW (ref 22–32)
CREATININE: 1.57 mg/dL — AB (ref 0.44–1.00)
Chloride: 112 mmol/L — ABNORMAL HIGH (ref 101–111)
GFR calc Af Amer: 38 mL/min — ABNORMAL LOW (ref >60)
GFR, EST NON AFRICAN AMERICAN: 33 mL/min — AB (ref >60)
GLUCOSE: 130 mg/dL — AB (ref 65–99)
Potassium: 3.1 mmol/L — ABNORMAL LOW (ref 3.5–5.1)
Sodium: 138 mmol/L (ref 135–145)

## 2015-09-08 LAB — CBC WITH DIFFERENTIAL/PLATELET
BASOS ABS: 0.1 10*3/uL (ref 0.0–0.1)
BASOS PCT: 0 %
Eosinophils Absolute: 0.1 10*3/uL (ref 0.0–0.7)
Eosinophils Relative: 1 %
HEMATOCRIT: 35.6 % — AB (ref 36.0–46.0)
HEMOGLOBIN: 12 g/dL (ref 12.0–15.0)
Lymphocytes Relative: 9 %
Lymphs Abs: 1.7 10*3/uL (ref 0.7–4.0)
MCH: 28 pg (ref 26.0–34.0)
MCHC: 33.7 g/dL (ref 30.0–36.0)
MCV: 83 fL (ref 78.0–100.0)
MONO ABS: 1 10*3/uL (ref 0.1–1.0)
Monocytes Relative: 5 %
NEUTROS ABS: 16.4 10*3/uL — AB (ref 1.7–7.7)
NEUTROS PCT: 85 %
Platelets: 385 10*3/uL (ref 150–400)
RBC: 4.29 MIL/uL (ref 3.87–5.11)
RDW: 17.8 % — AB (ref 11.5–15.5)
WBC: 19.3 10*3/uL — ABNORMAL HIGH (ref 4.0–10.5)

## 2015-09-08 LAB — BASIC METABOLIC PANEL WITH GFR
Anion gap: 12 (ref 5–15)
Anion gap: 7 (ref 5–15)
BUN: 34 mg/dL — ABNORMAL HIGH (ref 6–20)
BUN: 33 mg/dL — ABNORMAL HIGH (ref 6–20)
CO2: 11 mmol/L — ABNORMAL LOW (ref 22–32)
CO2: 15 mmol/L — ABNORMAL LOW (ref 22–32)
Calcium: 7.3 mg/dL — ABNORMAL LOW (ref 8.9–10.3)
Calcium: 7.3 mg/dL — ABNORMAL LOW (ref 8.9–10.3)
Chloride: 115 mmol/L — ABNORMAL HIGH (ref 101–111)
Chloride: 116 mmol/L — ABNORMAL HIGH (ref 101–111)
Creatinine, Ser: 1.45 mg/dL — ABNORMAL HIGH (ref 0.44–1.00)
Creatinine, Ser: 1.39 mg/dL — ABNORMAL HIGH (ref 0.44–1.00)
GFR calc Af Amer: 42 mL/min — ABNORMAL LOW
GFR calc Af Amer: 44 mL/min — ABNORMAL LOW
GFR calc non Af Amer: 36 mL/min — ABNORMAL LOW
GFR calc non Af Amer: 38 mL/min — ABNORMAL LOW
Glucose, Bld: 137 mg/dL — ABNORMAL HIGH (ref 65–99)
Glucose, Bld: 185 mg/dL — ABNORMAL HIGH (ref 65–99)
Potassium: 3.8 mmol/L (ref 3.5–5.1)
Potassium: 3.9 mmol/L (ref 3.5–5.1)
Sodium: 138 mmol/L (ref 135–145)
Sodium: 138 mmol/L (ref 135–145)

## 2015-09-08 LAB — PROTIME-INR
INR: 1.47 (ref 0.00–1.49)
Prothrombin Time: 17.9 seconds — ABNORMAL HIGH (ref 11.6–15.2)

## 2015-09-08 LAB — URINALYSIS, ROUTINE W REFLEX MICROSCOPIC
Bilirubin Urine: NEGATIVE
Glucose, UA: NEGATIVE mg/dL
HGB URINE DIPSTICK: NEGATIVE
Ketones, ur: NEGATIVE mg/dL
LEUKOCYTES UA: NEGATIVE
NITRITE: NEGATIVE
SPECIFIC GRAVITY, URINE: 1.025 (ref 1.005–1.030)
pH: 5.5 (ref 5.0–8.0)

## 2015-09-08 LAB — GLUCOSE, CAPILLARY
GLUCOSE-CAPILLARY: 164 mg/dL — AB (ref 65–99)
Glucose-Capillary: 139 mg/dL — ABNORMAL HIGH (ref 65–99)
Glucose-Capillary: 185 mg/dL — ABNORMAL HIGH (ref 65–99)

## 2015-09-08 LAB — I-STAT CG4 LACTIC ACID, ED: Lactic Acid, Venous: 2.67 mmol/L (ref 0.5–2.0)

## 2015-09-08 LAB — BRAIN NATRIURETIC PEPTIDE: B Natriuretic Peptide: 73 pg/mL (ref 0.0–100.0)

## 2015-09-08 LAB — APTT: APTT: 34 s (ref 24–37)

## 2015-09-08 LAB — PROCALCITONIN: Procalcitonin: 0.29 ng/mL

## 2015-09-08 LAB — D-DIMER, QUANTITATIVE (NOT AT ARMC): D DIMER QUANT: 0.76 ug{FEU}/mL — AB (ref 0.00–0.50)

## 2015-09-08 LAB — TROPONIN I: Troponin I: <0.03 ng/mL (ref <0.031)

## 2015-09-08 MED ORDER — METHYLPREDNISOLONE SODIUM SUCC 125 MG IJ SOLR
125.0000 mg | Freq: Once | INTRAMUSCULAR | Status: AC
  Administered 2015-09-08: 125 mg via INTRAVENOUS
  Filled 2015-09-08: qty 2

## 2015-09-08 MED ORDER — GUAIFENESIN ER 600 MG PO TB12: 1200.0000 mg | ORAL_TABLET | Freq: Two times a day (BID) | ORAL | Status: DC

## 2015-09-08 MED ORDER — IPRATROPIUM-ALBUTEROL 0.5-2.5 (3) MG/3ML IN SOLN
3.0000 mL | Freq: Once | RESPIRATORY_TRACT | Status: AC
  Administered 2015-09-08: 3 mL via RESPIRATORY_TRACT
  Filled 2015-09-08: qty 3

## 2015-09-08 MED ORDER — LORAZEPAM 2 MG/ML IJ SOLN
INTRAMUSCULAR | Status: AC
  Filled 2015-09-08: qty 1

## 2015-09-08 MED ORDER — POTASSIUM CHLORIDE IN NACL 20-0.9 MEQ/L-% IV SOLN: INTRAVENOUS | Status: DC

## 2015-09-08 MED ORDER — PROPOFOL 10 MG/ML IV BOLUS
INTRAVENOUS | Status: DC | PRN
Start: 2015-09-08 — End: 2015-09-08
  Administered 2015-09-08: 80 mg via INTRAVENOUS

## 2015-09-08 MED ORDER — POTASSIUM CHLORIDE 10 MEQ/100ML IV SOLN
10.0000 meq | Freq: Once | INTRAVENOUS | Status: AC
  Administered 2015-09-08: 10 meq via INTRAVENOUS
  Filled 2015-09-08: qty 100

## 2015-09-08 MED ORDER — INSULIN DETEMIR 100 UNIT/ML ~~LOC~~ SOLN
10.0000 [IU] | Freq: Every day | SUBCUTANEOUS | Status: DC
  Administered 2015-09-08 – 2015-09-25 (×18): 10 [IU] via SUBCUTANEOUS
  Filled 2015-09-08 (×18): qty 0.1

## 2015-09-08 MED ORDER — SODIUM BICARBONATE 8.4 % IV SOLN
INTRAVENOUS | Status: DC
  Administered 2015-09-08 – 2015-09-09 (×2): via INTRAVENOUS
  Filled 2015-09-08 (×4): qty 1000

## 2015-09-08 MED ORDER — SODIUM CHLORIDE 0.9 % IV SOLN
INTRAVENOUS | Status: DC | PRN
  Administered 2015-09-08: 17:00:00 via INTRAVENOUS

## 2015-09-08 MED ORDER — SODIUM CHLORIDE 0.9 % IV BOLUS (SEPSIS)
1000.0000 mL | Freq: Once | INTRAVENOUS | Status: AC
  Administered 2015-09-08: 1000 mL via INTRAVENOUS

## 2015-09-08 MED ORDER — POTASSIUM CHLORIDE CRYS ER 20 MEQ PO TBCR
40.0000 meq | EXTENDED_RELEASE_TABLET | Freq: Once | ORAL | Status: AC
  Administered 2015-09-08: 40 meq via ORAL
  Filled 2015-09-08: qty 2

## 2015-09-08 MED ORDER — DEXTROSE 5 % IV SOLN
500.0000 mg | INTRAVENOUS | Status: DC
  Administered 2015-09-09 – 2015-09-13 (×5): 500 mg via INTRAVENOUS
  Filled 2015-09-08 (×6): qty 500

## 2015-09-08 MED ORDER — DEXTROSE 5 % IV SOLN
500.0000 mg | Freq: Once | INTRAVENOUS | Status: AC
  Administered 2015-09-08: 500 mg via INTRAVENOUS
  Filled 2015-09-08: qty 500

## 2015-09-08 MED ORDER — FENTANYL CITRATE (PF) 2500 MCG/50ML IJ SOLN
INTRAMUSCULAR | Status: AC
  Filled 2015-09-08: qty 50

## 2015-09-08 MED ORDER — SODIUM CHLORIDE 0.9 % IV BOLUS (SEPSIS)
250.0000 mL | Freq: Once | INTRAVENOUS | Status: AC
  Administered 2015-09-08: 250 mL via INTRAVENOUS

## 2015-09-08 MED ORDER — FAMOTIDINE IN NACL 20-0.9 MG/50ML-% IV SOLN
20.0000 mg | Freq: Two times a day (BID) | INTRAVENOUS | Status: DC
  Administered 2015-09-08 – 2015-09-16 (×17): 20 mg via INTRAVENOUS
  Filled 2015-09-08 (×18): qty 50

## 2015-09-08 MED ORDER — ALBUTEROL SULFATE (2.5 MG/3ML) 0.083% IN NEBU: 2.5000 mg | INHALATION_SOLUTION | RESPIRATORY_TRACT | Status: DC | PRN

## 2015-09-08 MED ORDER — CHLORHEXIDINE GLUCONATE CLOTH 2 % EX PADS
6.0000 | MEDICATED_PAD | Freq: Every day | CUTANEOUS | Status: AC
  Administered 2015-09-09 – 2015-09-13 (×4): 6 via TOPICAL

## 2015-09-08 MED ORDER — MUPIROCIN 2 % EX OINT
1.0000 "application " | TOPICAL_OINTMENT | Freq: Two times a day (BID) | CUTANEOUS | Status: AC
  Administered 2015-09-09 – 2015-09-13 (×10): 1 via NASAL
  Filled 2015-09-08: qty 22

## 2015-09-08 MED ORDER — IPRATROPIUM-ALBUTEROL 0.5-2.5 (3) MG/3ML IN SOLN
3.0000 mL | RESPIRATORY_TRACT | Status: DC
  Administered 2015-09-08 – 2015-09-13 (×29): 3 mL via RESPIRATORY_TRACT
  Filled 2015-09-08 (×29): qty 3

## 2015-09-08 MED ORDER — FENTANYL CITRATE (PF) 100 MCG/2ML IJ SOLN
50.0000 ug | INTRAMUSCULAR | Status: DC | PRN
  Administered 2015-09-08: 50 ug via INTRAVENOUS
  Filled 2015-09-08 (×2): qty 2

## 2015-09-08 MED ORDER — ENOXAPARIN SODIUM 40 MG/0.4ML ~~LOC~~ SOLN: 40.0000 mg | SUBCUTANEOUS | Status: DC

## 2015-09-08 MED ORDER — DEXTROSE 5 % IV SOLN
1.0000 g | Freq: Once | INTRAVENOUS | Status: AC
  Administered 2015-09-08: 1 g via INTRAVENOUS
  Filled 2015-09-08: qty 10

## 2015-09-08 MED ORDER — DEXTROSE 5 % IV SOLN
1.0000 g | INTRAVENOUS | Status: DC
  Filled 2015-09-08: qty 10

## 2015-09-08 MED ORDER — SODIUM CHLORIDE 0.9 % IV SOLN
1.0000 mg/h | INTRAVENOUS | Status: DC
  Administered 2015-09-08: 1 mg/h via INTRAVENOUS
  Filled 2015-09-08 (×2): qty 10

## 2015-09-08 MED ORDER — CHLORHEXIDINE GLUCONATE 0.12% ORAL RINSE (MEDLINE KIT)
15.0000 mL | Freq: Two times a day (BID) | OROMUCOSAL | Status: DC
  Administered 2015-09-08 – 2015-09-26 (×36): 15 mL via OROMUCOSAL

## 2015-09-08 MED ORDER — MIDAZOLAM HCL 2 MG/2ML IJ SOLN
1.0000 mg | INTRAMUSCULAR | Status: DC | PRN
  Administered 2015-09-08: 1 mg via INTRAVENOUS
  Filled 2015-09-08 (×2): qty 2

## 2015-09-08 MED ORDER — SODIUM CHLORIDE 0.9 % IV SOLN: INTRAVENOUS | Status: DC

## 2015-09-08 MED ORDER — PREGABALIN 75 MG PO CAPS: 150.0000 mg | ORAL_CAPSULE | Freq: Every day | ORAL | Status: DC

## 2015-09-08 MED ORDER — MIDAZOLAM HCL 50 MG/10ML IJ SOLN
INTRAMUSCULAR | Status: AC
  Filled 2015-09-08: qty 1

## 2015-09-08 MED ORDER — SODIUM CHLORIDE 0.9 % IV SOLN
10.0000 ug/h | INTRAVENOUS | Status: DC
  Administered 2015-09-08: 10 ug/h via INTRAVENOUS
  Filled 2015-09-08: qty 50

## 2015-09-08 MED ORDER — INSULIN ASPART 100 UNIT/ML ~~LOC~~ SOLN
0.0000 [IU] | SUBCUTANEOUS | Status: DC
Start: 2015-09-08 — End: 2015-09-26
  Administered 2015-09-08 (×2): 3 [IU] via SUBCUTANEOUS
  Administered 2015-09-08 – 2015-09-09 (×3): 2 [IU] via SUBCUTANEOUS
  Administered 2015-09-09 – 2015-09-10 (×2): 3 [IU] via SUBCUTANEOUS
  Administered 2015-09-10: 2 [IU] via SUBCUTANEOUS
  Administered 2015-09-10: 3 [IU] via SUBCUTANEOUS
  Administered 2015-09-11 (×3): 2 [IU] via SUBCUTANEOUS
  Administered 2015-09-11 – 2015-09-12 (×2): 3 [IU] via SUBCUTANEOUS
  Administered 2015-09-12: 2 [IU] via SUBCUTANEOUS
  Administered 2015-09-12 – 2015-09-13 (×2): 3 [IU] via SUBCUTANEOUS
  Administered 2015-09-13: 2 [IU] via SUBCUTANEOUS
  Administered 2015-09-13 – 2015-09-14 (×5): 3 [IU] via SUBCUTANEOUS
  Administered 2015-09-14 – 2015-09-15 (×4): 2 [IU] via SUBCUTANEOUS
  Administered 2015-09-15 (×2): 3 [IU] via SUBCUTANEOUS
  Administered 2015-09-16 (×3): 2 [IU] via SUBCUTANEOUS
  Administered 2015-09-17: 3 [IU] via SUBCUTANEOUS
  Administered 2015-09-17 (×2): 2 [IU] via SUBCUTANEOUS
  Administered 2015-09-18 (×2): 3 [IU] via SUBCUTANEOUS
  Administered 2015-09-18 – 2015-09-19 (×4): 2 [IU] via SUBCUTANEOUS
  Administered 2015-09-19: 3 [IU] via SUBCUTANEOUS
  Administered 2015-09-19 (×2): 2 [IU] via SUBCUTANEOUS
  Administered 2015-09-19 – 2015-09-20 (×2): 3 [IU] via SUBCUTANEOUS
  Administered 2015-09-20: 2 [IU] via SUBCUTANEOUS
  Administered 2015-09-21: 3 [IU] via SUBCUTANEOUS
  Administered 2015-09-21 – 2015-09-22 (×3): 2 [IU] via SUBCUTANEOUS
  Administered 2015-09-22: 3 [IU] via SUBCUTANEOUS
  Administered 2015-09-22: 2 [IU] via SUBCUTANEOUS
  Administered 2015-09-23: 3 [IU] via SUBCUTANEOUS
  Administered 2015-09-23: 2 [IU] via SUBCUTANEOUS
  Administered 2015-09-23: 3 [IU] via SUBCUTANEOUS
  Administered 2015-09-23 (×2): 2 [IU] via SUBCUTANEOUS
  Administered 2015-09-24: 8 [IU] via SUBCUTANEOUS
  Administered 2015-09-24: 15 [IU] via SUBCUTANEOUS
  Administered 2015-09-25: 3 [IU] via SUBCUTANEOUS
  Administered 2015-09-25: 5 [IU] via SUBCUTANEOUS
  Administered 2015-09-25: 15 [IU] via SUBCUTANEOUS
  Administered 2015-09-25: 5 [IU] via SUBCUTANEOUS
  Administered 2015-09-25: 15 [IU] via SUBCUTANEOUS
  Administered 2015-09-25: 8 [IU] via SUBCUTANEOUS
  Administered 2015-09-26: 11 [IU] via SUBCUTANEOUS
  Administered 2015-09-26 (×2): 15 [IU] via SUBCUTANEOUS

## 2015-09-08 MED ORDER — LORAZEPAM 2 MG/ML IJ SOLN
0.5000 mg | INTRAMUSCULAR | Status: AC
  Administered 2015-09-08: 0.5 mg via INTRAVENOUS

## 2015-09-08 MED ORDER — DEXTROSE 5 % IV SOLN
2.0000 g | INTRAVENOUS | Status: DC
  Administered 2015-09-09 – 2015-09-13 (×5): 2 g via INTRAVENOUS
  Filled 2015-09-08 (×6): qty 2

## 2015-09-08 MED ORDER — MIDAZOLAM HCL 2 MG/2ML IJ SOLN
1.0000 mg | INTRAMUSCULAR | Status: DC | PRN
  Administered 2015-09-09: 1 mg via INTRAVENOUS

## 2015-09-08 MED ORDER — SUCCINYLCHOLINE CHLORIDE 20 MG/ML IJ SOLN
INTRAMUSCULAR | Status: DC | PRN
  Administered 2015-09-08: 80 mg via INTRAVENOUS

## 2015-09-08 MED ORDER — FENTANYL CITRATE (PF) 100 MCG/2ML IJ SOLN
50.0000 ug | INTRAMUSCULAR | Status: DC | PRN
  Administered 2015-09-09 – 2015-09-11 (×5): 50 ug via INTRAVENOUS

## 2015-09-08 MED ORDER — PREGABALIN 50 MG PO CAPS: 100.0000 mg | ORAL_CAPSULE | Freq: Every day | ORAL | Status: DC

## 2015-09-08 MED ORDER — ANTISEPTIC ORAL RINSE SOLUTION (CORINZ)
7.0000 mL | Freq: Four times a day (QID) | OROMUCOSAL | Status: DC
  Administered 2015-09-09 – 2015-09-26 (×70): 7 mL via OROMUCOSAL

## 2015-09-08 MED ORDER — ALBUTEROL SULFATE (2.5 MG/3ML) 0.083% IN NEBU
2.5000 mg | INHALATION_SOLUTION | RESPIRATORY_TRACT | Status: DC | PRN
  Administered 2015-09-18 – 2015-09-24 (×4): 2.5 mg via RESPIRATORY_TRACT
  Filled 2015-09-08 (×3): qty 3

## 2015-09-08 NOTE — Progress Notes (Signed)
Patient placed on 15 lpm [place] HFNC after breathing treatment. O2 sats 93%.

## 2015-09-08 NOTE — Progress Notes (Signed)
RTT pulled ETT tube back to 21 cm @ the lip.

## 2015-09-08 NOTE — ED Notes (Signed)
Patient having difficulty breathing x 3 days.  Chest hurts when she deep breaths.  Was clammy on arrival of EMS.  Coughing up white sputum.  CBG 134

## 2015-09-08 NOTE — ED Notes (Signed)
Called respiratory regarding pt 02 sats 86, nurse was told to put pt on NRB mask, mask applied.

## 2015-09-08 NOTE — Anesthesia Procedure Notes (Signed)
Procedure Name: Intubation Date/Time: 08/31/2015 5:25 PM Performed by: Charmaine Downs Pre-anesthesia Checklist: Patient identified, Emergency Drugs available, Suction available and Patient being monitored Patient Re-evaluated:Patient Re-evaluated prior to inductionOxygen Delivery Method: Ambu bag Preoxygenation: Pre-oxygenation with 100% oxygen Intubation Type: IV induction Ventilation: Mask ventilation without difficulty Laryngoscope Size: Mac and 3 Grade View: Grade II Tube type: Subglottic suction tube Number of attempts: 1 Airway Equipment and Method: Stylet Placement Confirmation: ETT inserted through vocal cords under direct vision,  positive ETCO2,  CO2 detector and breath sounds checked- equal and bilateral Secured at: 22 cm Tube secured with: Tape Dental Injury: Teeth and Oropharynx as per pre-operative assessment

## 2015-09-08 NOTE — Consult Note (Signed)
PULMONARY / CRITICAL CARE MEDICINE   Name: Rebecca Richards MRN: [id number] DOB: 08-Jul-1945    ADMISSION DATE:  09/22/2015 CONSULTATION DATE:  09/13/2015  REFERRING MD:  Kathie Dike, M.D. / [facility]  CHIEF COMPLAINT:  Dyspnea  HISTORY OF PRESENT ILLNESS:   70 y/o female with a past medical history of obstructive sleep apnea, diabetes mellitus type 2 came to the [facility] on 6/14 complaining of dyspnea for three days.  She was admitted by the hospitalist service with presumed Community Acquired Pneumonia causing acute respiratory failure with hypoxemia and presumed sepsis.  She was treated with IV fluids, IV ceftriaxone and azithromycin and BIPAP and admitted to the intensive care unit.  She was noted to have a severe respiratory acidosis on admission.  Despite the interventions from the Hospitalist team the patient's tachypnea persisted throughout the evening until ultimately she required intubation due to ongoing tachypnea.  Because there was no pulmonary and critical care support at [facility], she was transferred to [facility] for further evaluation.  PAST MEDICAL HISTORY :  Past Medical History  Diagnosis Date  . Brain tumor [facility])     Surgery for benign brain tumor in the remote past  . Hypertension   . Preop cardiovascular exam     Cardiac clearance for knee surgery, January, 2013  . Edema   . Tobacco abuse   . Ejection fraction     EF 70%, echo, February, 2013  . Sleep apnea     uses Cpap  . Hepatitis 1970's    hep c  . Anxiety   . 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 Nasal congestion   . Diabetes mellitus without complication ([place])   . Insomnia   . Spinal cord stimulator status     for pain   . Chronic knee pain   . Chronic back pain     with radiculopathy bilat legs  . Depression   . Arthritis   . Cancer ([place]) 2006    renal Rt  . Renal disorder     was diagnosed with kidney CA but tumor shrinked without intervention    PAST SURGICAL HISTORY: Past  Surgical History  Procedure Laterality Date  . Abdominal hysterectomy    . Knee surgery    . Nose surgery    . Back surgery      pt has stimulator in lower back and can't have an MRI  . Total knee arthroplasty  07/21/2011    Procedure: TOTAL KNEE ARTHROPLASTY;  Surgeon: Yvette Rack., MD;  Location: [place];  Service: Orthopedics;  Laterality: Left;  . 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 Cholecystectomy N/A 09/30/2012    Procedure: LAPAROSCOPIC CHOLECYSTECTOMY;  Surgeon: Donato Heinz, MD;  Location: [facility];  Service: General;  Laterality: N/A;    No Known Allergies  No current facility-administered medications on file prior to encounter.   Current Outpatient Prescriptions on File Prior to Encounter  Medication Sig  . hydrochlorothiazide (MICROZIDE) 12.5 MG capsule Take 1 capsule by mouth daily.  Marland Kitchen HYDROcodone-acetaminophen (NORCO) 7.5-325 MG tablet One tablet every four hours as needed for pain.  Do not drive car or operate machinery.  Must last 30 days. (Patient taking differently: Take 1 tablet by mouth every 4 (four) hours as needed for moderate pain. One tablet every four hours as needed for pain.  Do not drive car or operate machinery.  Must last 30  days.)  . ibuprofen (ADVIL,MOTRIN) 200 MG tablet Take 200 mg by mouth every 6 (six) hours as needed for mild pain or moderate pain.  . Insulin Detemir (LEVEMIR FLEXTOUCH) 100 UNIT/ML Pen Inject 20 Units into the skin daily at 10 pm.  . metFORMIN (GLUCOPHAGE) 500 MG tablet Take 500 mg by mouth 2 (two) times daily.  . Omega-3 Fatty Acids (FISH OIL) 1000 MG CAPS Take 1 capsule by mouth daily.  . ondansetron (ZOFRAN ODT) 8 MG disintegrating tablet Take 1 tablet (8 mg total) by mouth every 8 (eight) hours as needed for nausea or vomiting.  Marland Kitchen telmisartan (MICARDIS) 80 MG tablet Take 80 mg by mouth daily.    FAMILY HISTORY:  Family History  Problem Relation Age of Onset  . Cancer Mother   . Hypertension Mother   . Anesthesia  problems Neg Hx   . Diabetes Mother   . Diabetes Brother     SOCIAL HISTORY: Social History  Substance Use Topics  . Smoking status: Former Smoker -- 0.50 packs/day for 10 years    Types: Cigarettes    Quit date: 12/26/2013  . Smokeless tobacco: Never Used  . Alcohol Use: No     Comment: none since 03-28-2011    REVIEW OF SYSTEMS:  Unable to obtain given intubation and sedation.  SUBJECTIVE: As above.  VITAL SIGNS: BP 112/67 mmHg  Pulse 95  Temp(Src) 98.3 F (36.8 C) (Axillary)  Resp 31  Ht 5\' 1"  (1.549 m)  Wt 73.6 kg (162 lb 4.1 oz)  BMI 30.67 kg/m2  SpO2 100%  HEMODYNAMICS:    VENTILATOR SETTINGS: Vent Mode:  [-] PRVC FiO2 (%):  [55 %-100 %] 100 % Set Rate:  [12 bmp] 12 bmp Vt Set:  [430 mL] 430 mL PEEP:  [5 cmH20] 5 cmH20 Plateau Pressure:  [20 cmH20] 20 cmH20  INTAKE / OUTPUT: I/O last 3 completed shifts: In: 400 [I.V.:400] Out: -   PHYSICAL EXAMINATION: General:  Sedated. No acute distress. No family at bedside.  Integument:  Warm & dry. No rash on exposed skin.  Lymphatics:  No appreciated cervical or supraclavicular lymphadenoapthy. HEENT:  No scleral injection or icterus. Endotracheal tube in place.  Cardiovascular:  Regular rate. No edema. No appreciable JVD.  Pulmonary:  Coarse breath sounds bilaterally. Symmetric chest wall rise on ventilator. Abdomen: Soft. Normal bowel sounds. Nondistended.  Musculoskeletal:  Normal bulk and tone. No joint deformity or effusion appreciated. Neurological:  No meningismus. Sedated. Pupils pinpoint and symmetric. Psychiatric:  Unable to assess given intubation and sedation.   LABS:  BMET  Recent Labs Lab 09/19/2015 0628 09/22/2015 1038 08/26/2015 1859  NA 138 138 138  K 3.1* 3.8 3.9  CL 112* 115* 116*  CO2 11* 11* 15*  BUN 36* 34* 33*  CREATININE 1.57* 1.45* 1.39*  GLUCOSE 130* 137* 185*    Electrolytes  Recent Labs Lab 09/09/2015 0628 09/04/2015 1038 08/31/2015 1859  CALCIUM 8.2* 7.3* 7.3*     CBC  Recent Labs Lab 09/11/2015 0628 09/07/2015 1038  WBC 19.3* 17.6*  HGB 12.0 12.7  HCT 35.6* 38.0  PLT 385 375    Coag's  Recent Labs Lab 09/07/2015 1038  APTT 34  INR 1.47    Sepsis Markers  Recent Labs Lab 09/07/2015 0756 08/26/2015 1038 09/23/2015 1255  LATICACIDVEN 2.67* 1.2 1.0  PROCALCITON  --  0.29  --     ABG  Recent Labs Lab 09/04/2015 1015 08/27/2015 1511 08/30/2015 1817  PHART 7.236* 7.307* 7.201*  PCO2ART 22.5*  25.1* 37.5  PO2ART 79.8* 75.6* 73.2*    Liver Enzymes  Recent Labs Lab 09/18/2015 0755  AST 72*  ALT 17  ALKPHOS 79  BILITOT 0.6  ALBUMIN 3.5    Cardiac Enzymes  Recent Labs Lab 09/14/2015 0628  TROPONINI <0.03    Glucose  Recent Labs Lab 09/05/2015 1147 09/09/2015 1638 09/21/2015 1956  GLUCAP 139* 164* 185*    Imaging Portable Chest Xray  09/15/2015  CLINICAL DATA:  Respiratory failure.  Intubated. EXAM: PORTABLE CHEST 1 VIEW COMPARISON:  Chest radiograph from earlier today. FINDINGS: Endotracheal tube tip is 1.1 cm above the carina. Enteric tube enters stomach with the tip not seen on this image. Spinal stimulator tip overlies the lower thoracic spine. Stable cardiomediastinal silhouette with mild cardiomegaly. No pneumothorax. No pleural effusion. Severe fluffy parahilar airspace opacities throughout both lungs, significantly worsened. IMPRESSION: 1. Endotracheal tube tip is 1.1 cm above the carina. Recommend retracting 1 cm. 2. Stable mild cardiomegaly. Significant worsening of severe fluffy parahilar airspace opacities throughout both lungs, most suggestive of severe pulmonary edema. These results will be called to the ordering clinician or representative by the Radiologist Assistant, and communication documented in the PACS or zVision Dashboard. Electronically Signed   By: Ilona Sorrel M.D.   On: 09/01/2015 18:26   Dg Chest Port 1 View  08/28/2015  CLINICAL DATA:  Increasing shortness of breath for 3 days. History of hypertension,  diabetes, former smoker, right renal cell cancer. EXAM: PORTABLE CHEST 1 VIEW COMPARISON:  08/18/2014 FINDINGS: Normal heart size and pulmonary vascularity. Bilateral perihilar infiltration greatest in the right lung base. This may indicate edema or pneumonia. No blunting of costophrenic angles. No pneumothorax. Calcification of the aorta. Spinal stimulator projected over the mid thoracic region. Degenerative changes in the spine and shoulders. IMPRESSION: Bilateral perihilar and right basilar infiltrates may indicate edema or pneumonia. Electronically Signed   By: Lucienne Capers M.D.   On: 09/20/2015 06:51     STUDIES:  Port CXR 6/14: Endotracheal tube approximately 1 cm above carina. Patchy bilateral alveolar opacities. Enteric feeding tube coursing below diaphragm and appears to loop in the fundus of the stomach. Port CXR 6/15>>  MICROBIOLOGY: MRSA PCR 6/14:  Positive Blood Ctx x2 6/14 >> Urine Ctx 6/14 >> Urine Strep Ag 6/14:  Negative Urine Legionella Ag 6/14 >> Tracheal Asp Ctx 6/15 >>  ANTIBIOTICS: Rocephin 6/14 >> Azithromycin 6/14 >>  SIGNIFICANT EVENTS: 6/14 - Admission to [facility], intubation, transfer to [facility]  LINES/TUBES: OETT 6/14 >> FOLEY 6/14 >> OGT 6/14 >> PIV x2  DISCUSSION: 70 y/o female with a past medical history of obstructive sleep apnea and DM2 admitted on 6/14 with severe CAP leading to sepsis and acute respiratory failure with hypxomia requiring intubation.  Cardiac biomarkers are within normal range. Suspect severe community acquired pneumonia given leukocytosis. Continuing antibiotic coverage while awaiting infectious workup results.  ASSESSMENT / PLAN:  PULMONARY A: Acute Hypoxic Respiratory Failure Severe CAP H/O OSA - On CPAP. H/O Tobacco Use.  P:   Full vent support  VAP prevention bundle Daily WUA/SBT Duoneb q4hr Stat Port CXR to confirm tube placement  CARDIOVASCULAR A:  Borderline Hypotension - Likely due to  sepsis. Improved. Troponin I & BNP normal. H/O HTN  P:  Monitor on telemetry  Vitals per unit protocol Checking TTE  RENAL A:   Acute Renal Failure - Improving. Metabolic Acidosis - Improving on Bicarb gtt.  P:   Continuing Bicarb gtt @ 100cc/hr Trending UOP Monitoring  electrolytes & renal function q8hr Urine Electrolytes pending Replace electrolytes as needed  GASTROINTESTINAL A:   No acute issues. H/O Hepatitis C  P:   NPO Pepcid IV q12hr Holding on tube feedings for now  HEMATOLOGIC/ONCOLOGIC A:   Leukocytosis - Secondary to sepsis. H/O Brain/Pituitary Tumor - Resected 2012. H/O Right Renal Cancer  P:  Trending cell counts daily w/ CBC SCDs Heparin Kelliher q8hr  INFECTIOUS A:   Sepsis Severe CAP  P:   Empiric Rocephin & Azithromycin Day #2 Awaiting culture results & urine legionella antigen PCT per algorithm  ENDOCRINE A:   H/O DM Type 2 H/O Pituitary Tumor - S/P Resection.  P:   Accu-Checks q4hr SSI per Moderate Dose Algorithm Levemir 10u [place] qhs  NEUROLOGIC A:   Sedation on Ventilator H/O Brain/Pituitary Tumor - Resected 2012. H/O Anxiety/Depression H/O Chronic Pain - S/P Spinal Cord Stimulator.  P:   RASS goal: -2 Fentanyl gtt & IV prn Versed gtt Lyrica bid  FAMILY  - Updates: No family at bedside 6/15.  - Inter-disciplinary family meet or Palliative Care meeting due by:  6/21  I have spent a total of 38 minutes of critical care time today caring for the patient and reviewing the patient's electronic medical record.  Sonia Baller Ashok Cordia, M.D. [facility] Pulmonary & Critical Care Pager:  [phone] After 3pm or if no response, call [phone] 2:24 AM 08/29/2015

## 2015-09-08 NOTE — ED Notes (Signed)
Respiratory at bedside.

## 2015-09-08 NOTE — Progress Notes (Signed)
CRITICAL VALUE ALERT  Critical value received:  mrsa positive  Date of notification:  09/01/2015  Time of notification:  4:17 PM  Critical value read back:Yes.    Nurse who received alert:  Kiyon Fidalgo anne schonewitz, rn     MD notified (1st page):  memon  Time of first page:  4:17 PM  MD notified (2nd page):  Time of second page:  Responding MD:  memon  Time MD responded:  4:17 PM

## 2015-09-08 NOTE — ED Provider Notes (Signed)
CSN: [id number]     Arrival date & time 09/06/2015  [id number] History   First MD Initiated Contact with Patient 09/15/2015 0617     Chief Complaint  Patient presents with  . Shortness of Breath     (Consider location/radiation/quality/duration/timing/severity/associated sxs/prior Treatment) Patient is a 70 y.o. female presenting with shortness of breath. The history is provided by the patient.  Shortness of Breath She reportedly has been having difficulty breathing for the last 3 days which got worse tonight. There is a cough which is productive of small amount of white sputum. She has some soreness in her chest when she takes a deep breath, but no other chest pain. There is been no fever, chills, sweats. There's been no vomiting or diarrhea. She arrived via ambulance. She denies prior episodes like this.  Past Medical History  Diagnosis Date  . Brain tumor [facility])     Surgery for benign brain tumor in the remote past  . Hypertension   . Preop cardiovascular exam     Cardiac clearance for knee surgery, January, 2013  . Edema   . Tobacco abuse   . Ejection fraction     EF 70%, echo, February, 2013  . Sleep apnea     uses Cpap  . Hepatitis 1970's    hep c  . Anxiety   . 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 Nasal congestion   . Diabetes mellitus without complication ([place])   . Insomnia   . Spinal cord stimulator status     for pain   . Chronic knee pain   . Chronic back pain     with radiculopathy bilat legs  . Depression   . Arthritis   . Cancer ([place]) 2006    renal Rt  . 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 Abdominal hysterectomy    . Knee surgery    . Nose surgery    . Back surgery      pt has stimulator in lower back and can't have an MRI  . Total knee arthroplasty  07/21/2011    Procedure: TOTAL KNEE ARTHROPLASTY;  Surgeon: Yvette Rack., MD;  Location: [facility];  Service:  Orthopedics;  Laterality: Left;  . 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 Cholecystectomy N/A 09/30/2012    Procedure: LAPAROSCOPIC CHOLECYSTECTOMY;  Surgeon: Donato Heinz, MD;  Location: [facility];  Service: General;  Laterality: N/A;   Family History  Problem Relation Age of Onset  . Cancer Mother   . Hypertension Mother   . Anesthesia problems Neg Hx   . Diabetes Mother   . Diabetes Brother    Social History  Substance Use Topics  . Smoking status: Former Smoker -- 0.50 packs/day for 10 years    Types: Cigarettes    Quit date: 12/26/2013  . Smokeless tobacco: Never Used  . Alcohol Use: No     Comment: none since 03-28-2011   OB History    Gravida Para Term Preterm AB TAB SAB Ectopic Multiple Living   1 1 1       1      Review of Systems  Respiratory: Positive for shortness of breath.   All other systems reviewed and are negative.     Allergies  Review of patient's allergies indicates no known allergies.  Home Medications   Prior  to Admission medications   Medication Sig Start Date End Date Taking? Authorizing Provider  hydrochlorothiazide (MICROZIDE) 12.5 MG capsule Take 1 capsule by mouth daily. 04/19/15   Historical Provider, MD  HYDROcodone-acetaminophen (NORCO) 7.5-325 MG tablet One tablet every four hours as needed for pain.  Do not drive car or operate machinery.  Must last 30 days. 08/12/15   Sanjuana Kava, MD  ibuprofen (ADVIL,MOTRIN) 200 MG tablet Take 200 mg by mouth every 6 (six) hours as needed for mild pain or moderate pain.    Historical Provider, MD  Insulin Detemir (LEVEMIR FLEXTOUCH) 100 UNIT/ML Pen Inject 20 Units into the skin daily at 10 pm.    Historical Provider, MD  metFORMIN (GLUCOPHAGE) 500 MG tablet Take 500 mg by mouth 2 (two) times daily.    Historical Provider, MD  Omega-3 Fatty Acids (FISH OIL) 1000 MG CAPS Take 1 capsule by mouth daily.    Historical Provider, MD  ondansetron (ZOFRAN ODT) 8 MG disintegrating tablet Take 1 tablet (8 mg  total) by mouth every 8 (eight) hours as needed for nausea or vomiting. 07/10/15   Jola Schmidt, MD  oxyCODONE-acetaminophen (PERCOCET) 5-325 MG tablet Take 1-2 tablets by mouth every 4 (four) hours as needed. 08/07/15   Nat Christen, MD  pregabalin (LYRICA) 50 MG capsule Take 100-150 mg by mouth 2 (two) times daily. 100 mg in the morning and 150 at bedtime    Historical Provider, MD  telmisartan (MICARDIS) 80 MG tablet Take 80 mg by mouth daily. 04/19/15   Historical Provider, MD   BP 100/88 mmHg  Pulse 88  Temp(Src) 97.5 F (36.4 C) (Oral)  Resp 28  Ht 5\' 1"  (1.549 m)  Wt 163 lb (73.936 kg)  BMI 30.81 kg/m2  SpO2 98% Physical Exam  Nursing note and vitals reviewed.  69 year old female, appears moderately dyspneic, but is in no acute distress. Vital signs are significant for tachypnea. Oxygen saturation is 98%, which is normal. Head is normocephalic and atraumatic. PERRLA, EOMI. Oropharynx is clear. Neck is nontender and supple without adenopathy or JVD. Back is nontender and there is no CVA tenderness. Lungs have some fine rales and coarse wheezes throughout, but there are no rhonchi. Chest is nontender. Heart has regular rate and rhythm without murmur. Abdomen is soft, flat, nontender without masses or hepatosplenomegaly and peristalsis is normoactive. Extremities have no cyanosis or edema, full range of motion is present. Skin is warm and dry without rash. Neurologic: Mental status is normal, cranial nerves are intact, there are no motor or sensory deficits.  ED Course  Procedures (including critical care time) Labs Review Results for orders placed or performed during the hospital encounter of 0000000  Basic metabolic panel  Result Value Ref Range   Sodium 138 135 - 145 mmol/L   Potassium 3.1 (L) 3.5 - 5.1 mmol/L   Chloride 112 (H) 101 - 111 mmol/L   CO2 11 (L) 22 - 32 mmol/L   Glucose, Bld 130 (H) 65 - 99 mg/dL   BUN 36 (H) 6 - 20 mg/dL   Creatinine, Ser 1.57 (H) 0.44 - 1.00  mg/dL   Calcium 8.2 (L) 8.9 - 10.3 mg/dL   GFR calc non Af Amer 33 (L) >60 mL/min   GFR calc Af Amer 38 (L) >60 mL/min   Anion gap 15 5 - 15  Troponin I  Result Value Ref Range   Troponin I <0.03 <0.031 ng/mL  Brain natriuretic peptide  Result Value Ref Range   B  Natriuretic Peptide 73.0 0.0 - 100.0 pg/mL  D-dimer, quantitative  Result Value Ref Range   D-Dimer, Quant 0.76 (H) 0.00 - 0.50 ug/mL-FEU  CBC with Differential  Result Value Ref Range   WBC 19.3 (H) 4.0 - 10.5 K/uL   RBC 4.29 3.87 - 5.11 MIL/uL   Hemoglobin 12.0 12.0 - 15.0 g/dL   HCT 35.6 (L) 36.0 - 46.0 %   MCV 83.0 78.0 - 100.0 fL   MCH 28.0 26.0 - 34.0 pg   MCHC 33.7 30.0 - 36.0 g/dL   RDW 17.8 (H) 11.5 - 15.5 %   Platelets 385 150 - 400 K/uL   Neutrophils Relative % 85 %   Neutro Abs 16.4 (H) 1.7 - 7.7 K/uL   Lymphocytes Relative 9 %   Lymphs Abs 1.7 0.7 - 4.0 K/uL   Monocytes Relative 5 %   Monocytes Absolute 1.0 0.1 - 1.0 K/uL   Eosinophils Relative 1 %   Eosinophils Absolute 0.1 0.0 - 0.7 K/uL   Basophils Relative 0 %   Basophils Absolute 0.1 0.0 - 0.1 K/uL   Imaging Review Dg Chest Port 1 View  09/22/2015  CLINICAL DATA:  Increasing shortness of breath for 3 days. History of hypertension, diabetes, former smoker, right renal cell cancer. EXAM: PORTABLE CHEST 1 VIEW COMPARISON:  08/18/2014 FINDINGS: Normal heart size and pulmonary vascularity. Bilateral perihilar infiltration greatest in the right lung base. This may indicate edema or pneumonia. No blunting of costophrenic angles. No pneumothorax. Calcification of the aorta. Spinal stimulator projected over the mid thoracic region. Degenerative changes in the spine and shoulders. IMPRESSION: Bilateral perihilar and right basilar infiltrates may indicate edema or pneumonia. Electronically Signed   By: Lucienne Capers M.D.   On: 09/07/2015 06:51   I have personally reviewed and evaluated these images and lab results as part of my medical decision-making.    EKG Interpretation   Date/Time:  Wednesday September 08 2015 08:15:05 EDT Ventricular Rate:  89 PR Interval:  140 QRS Duration: 76 QT Interval:  388 QTC Calculation: 472 R Axis:   37 Text Interpretation:  Sinus rhythm Normal ECG When compared with ECG of  07/11/2015, No significant change was found Confirmed by [facility]  MD, Braydyn Schultes  ([id number]) on 09/01/2015 8:20:34 AM      CRITICAL CARE Performed by: [id number] Total critical care time: 105 minutes Critical care time was exclusive of separately billable procedures and treating other patients. Critical care was necessary to treat or prevent imminent or life-threatening deterioration. Critical care was time spent personally by me on the following activities: development of treatment plan with patient and/or surrogate as well as nursing, discussions with consultants, evaluation of patient's response to treatment, examination of patient, obtaining history from patient or surrogate, ordering and performing treatments and interventions, ordering and review of laboratory studies, ordering and review of radiographic studies, pulse oximetry and re-evaluation of patient's condition.  MDM   Final diagnoses:  Community acquired pneumonia  Acute kidney injury (nontraumatic) (HCC)  Metabolic acidosis  Elevated d-dimer  Hypokalemia    Cough and dyspnea. Chest x-ray or be obtained to rule out pneumonia. She is given albuterol with ipratropium nebulizer treatment. Old records reviewed and she has no relevant past visits.  Following Albuterol treatment, she felt slight improvement. On exam, there seemed to be slightly improved air flow but still considerable wheezing. Second nebulizer treatment is given. Chest x-ray was read as possible pulmonary edema versus pneumonia. BNP has come back normal making as pulmonary edema very  unlikely. She is started on sepsis protocol and started on early goal-directed fluid therapy and IV antibiotics. Laboratory evaluation  shows evidence of acute kidney injury with creatinine up to 1.57 and BUN of 36, and normal anion gap metabolic acidosis with CO2 of 11. WBC is very elevated at 19.3 with 85% neutrophils.  She had some improvement with the second albuterol nebulizer treatment. ECG is unremarkable. Reviewing her home medications, she is on metformin which can certainly contribute to a lactic acidosis. Lactic acid is pending at this time. Doubt ketoacidosis with glucose only 130 and normal anion gap. Potassium is come back at 3.1. This is likely to go lower given albuterol treatments. She is given oral and intravenous potassium. Case is discussed with Dr. Roderic Palau of [facility] who agrees to admit the patient. She will be placed in stepdown unit.  Delora Fuel, MD [phone]

## 2015-09-08 NOTE — H&P (Addendum)
History and Physical    Rebecca Richards [id number] DOB: April 18, 1945 DOA: 09/22/2015  Referring MD/NP/PA: Delora Fuel PCP: Elyn Peers, MD  Outpatient Specialists:   Orthopedic Surgery; Earlie Server, MD Patient coming from: home  Chief Complaint: Shortness of breath  HPI: Rebecca Richards is a 70 y.o. female with medical history significant of HTN, tobacco abuse, DM type 2, OSA on CPAP, presented to the ED via ambulance with complaints of shortness of breath for the last 3 days which significantly worsened last night. She reports an associated cough with production of white sputum as well as chest soreness with deep inhalation, wheeze, and slight fever. She denies any pain while coughing. Has been having normal bowel movements, no diarrhea. Denies any vomiting. Denies chills, or diaphoresis. Admits to smoking.  ED Course: While in the ED, CXR revealed bilateral perihilar and bibasilar infiltrates. She was noted to be in respiratory distress, and received nebulizer treatments as well as solumedrol. She was placed on HFNC, but eventually required transition to BiPAP. SHe was started on treatment for sepsis and PNA. Cultures were collected and sent for testing.   Review of Systems: As per HPI otherwise 10 point review of systems negative.   Past Medical History  Diagnosis Date  . Brain tumor [facility])     Surgery for benign brain tumor in the remote past  . Hypertension   . Preop cardiovascular exam     Cardiac clearance for knee surgery, January, 2013  . Edema   . Tobacco abuse   . Ejection fraction     EF 70%, echo, February, 2013  . Sleep apnea     uses Cpap  . Hepatitis 1970's    hep c  . Anxiety   . 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 Nasal congestion   . Diabetes mellitus without complication ([place])   . Insomnia   . Spinal cord stimulator status     for pain   . Chronic knee pain   . Chronic back pain     with radiculopathy bilat legs  . Depression   . Arthritis   .  Cancer ([place]) 2006    renal Rt  . Renal disorder     was diagnosed with kidney CA but tumor shrinked without intervention    Past Surgical History  Procedure Laterality Date  . Abdominal hysterectomy    . Knee surgery    . Nose surgery    . Back surgery      pt has stimulator in lower back and can't have an MRI  . Total knee arthroplasty  07/21/2011    Procedure: TOTAL KNEE ARTHROPLASTY;  Surgeon: Yvette Rack., MD;  Location: [place];  Service: Orthopedics;  Laterality: Left;  . 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 Cholecystectomy N/A 09/30/2012    Procedure: LAPAROSCOPIC CHOLECYSTECTOMY;  Surgeon: Donato Heinz, MD;  Location: [facility];  Service: General;  Laterality: N/A;     reports that she quit smoking about 20 months ago. Her smoking use included Cigarettes. She has a 5 pack-year smoking history. She has never used smokeless tobacco. She reports that she does not drink alcohol or use illicit drugs.  No Known Allergies  Family History  Problem Relation Age of Onset  . Cancer Mother   . Hypertension Mother   . Anesthesia problems Neg Hx   . Diabetes Mother   . Diabetes Brother     Prior to Admission  medications   Medication Sig Start Date End Date Taking? Authorizing Provider  hydrochlorothiazide (MICROZIDE) 12.5 MG capsule Take 1 capsule by mouth daily. 04/19/15   Historical Provider, MD  HYDROcodone-acetaminophen (NORCO) 7.5-325 MG tablet One tablet every four hours as needed for pain.  Do not drive car or operate machinery.  Must last 30 days. 08/12/15   Sanjuana Kava, MD  ibuprofen (ADVIL,MOTRIN) 200 MG tablet Take 200 mg by mouth every 6 (six) hours as needed for mild pain or moderate pain.    Historical Provider, MD  Insulin Detemir (LEVEMIR FLEXTOUCH) 100 UNIT/ML Pen Inject 20 Units into the skin daily at 10 pm.    Historical Provider, MD  metFORMIN (GLUCOPHAGE) 500 MG tablet Take 500 mg by mouth 2 (two) times daily.    Historical Provider, MD  Omega-3 Fatty Acids  (FISH OIL) 1000 MG CAPS Take 1 capsule by mouth daily.    Historical Provider, MD  ondansetron (ZOFRAN ODT) 8 MG disintegrating tablet Take 1 tablet (8 mg total) by mouth every 8 (eight) hours as needed for nausea or vomiting. 07/10/15   Jola Schmidt, MD  oxyCODONE-acetaminophen (PERCOCET) 5-325 MG tablet Take 1-2 tablets by mouth every 4 (four) hours as needed. 08/07/15   Nat Christen, MD  pregabalin (LYRICA) 50 MG capsule Take 100-150 mg by mouth 2 (two) times daily. 100 mg in the morning and 150 at bedtime    Historical Provider, MD  telmisartan (MICARDIS) 80 MG tablet Take 80 mg by mouth daily. 04/19/15   Historical Provider, MD    Physical Exam: Filed Vitals:   08/26/2015 0745 09/04/2015 0813 09/18/2015 0845 08/26/2015 0934  BP: 93/51 100/88 102/55   Pulse: 85 88 110 93  Temp:      TempSrc:      Resp: 29 28 27  39  Height:      Weight:      SpO2: 99% 98%  97%   Constitutional: appears uncomfortable, anxious, increased respiratory effort on BiPAP. Filed Vitals:   09/15/2015 0745 09/07/2015 0813 09/13/2015 0845 09/09/2015 0934  BP: 93/51 100/88 102/55   Pulse: 85 88 110 93  Temp:      TempSrc:      Resp: 29 28 27  39  Height:      Weight:      SpO2: 99% 98%  97%   Eyes: PERRL, lids and conjunctivae normal ENMT: Mucous membranes are moist. Posterior pharynx clear of any exudate or lesions.Normal dentition.  Neck: normal, supple, no masses, no thyromegaly Respiratory: increased respiratory effort, fair air move bilaterally with fine crackles at bases Cardiovascular: Regular rate and rhythm, no murmurs / rubs / gallops. No extremity edema. 2+ pedal pulses. No carotid bruits.  Abdomen: no tenderness, no masses palpated. No hepatosplenomegaly. Bowel sounds positive.  Musculoskeletal: no clubbing / cyanosis. No joint deformity upper and lower extremities. Good ROM, no contractures. Normal muscle tone.  Skin: no rashes, lesions, ulcers. No induration Neurologic: CN 2-12 grossly intact. Sensation intact,  DTR normal. Strength 5/5 in all 4.  Psychiatric: Appears very anxious. Limited exam due to respiratory issues.   Labs on Admission: I have personally reviewed following labs and imaging studies  CBC:  Recent Labs Lab 09/12/2015 0628  WBC 19.3*  NEUTROABS 16.4*  HGB 12.0  HCT 35.6*  MCV 83.0  PLT 0000000   Basic Metabolic Panel:  Recent Labs Lab 09/18/2015 0628  NA 138  K 3.1*  CL 112*  CO2 11*  GLUCOSE 130*  BUN 36*  CREATININE 1.57*  CALCIUM 8.2*   GFR: Estimated Creatinine Clearance: 31.9 mL/min (by C-G formula based on Cr of 1.57). Liver Function Tests:  Recent Labs Lab 09/14/2015 0755  AST 72*  ALT 17  ALKPHOS 79  BILITOT 0.6  PROT 7.7  ALBUMIN 3.5   Cardiac Enzymes:  Recent Labs Lab 09/03/2015 0628  TROPONINI <0.03   Urine analysis:    Component Value Date/Time   COLORURINE YELLOW 07/11/2015 1940   APPEARANCEUR CLEAR 07/11/2015 1940   LABSPEC <1.005* 07/11/2015 1940   PHURINE 6.0 07/11/2015 1940   GLUCOSEU NEGATIVE 07/11/2015 1940   HGBUR TRACE* 07/11/2015 1940   BILIRUBINUR NEGATIVE 07/11/2015 1940   KETONESUR NEGATIVE 07/11/2015 1940   PROTEINUR NEGATIVE 07/11/2015 1940   UROBILINOGEN 0.2 08/18/2014 1820   NITRITE NEGATIVE 07/11/2015 1940   LEUKOCYTESUR NEGATIVE 07/11/2015 1940   Sepsis Labs: @LABRCNTIP (procalcitonin:4,lacticidven:4) )No results found for this or any previous visit (from the past 240 hour(s)).   Radiological Exams on Admission: Dg Chest Port 1 View  09/21/2015  CLINICAL DATA:  Increasing shortness of breath for 3 days. History of hypertension, diabetes, former smoker, right renal cell cancer. EXAM: PORTABLE CHEST 1 VIEW COMPARISON:  08/18/2014 FINDINGS: Normal heart size and pulmonary vascularity. Bilateral perihilar infiltration greatest in the right lung base. This may indicate edema or pneumonia. No blunting of costophrenic angles. No pneumothorax. Calcification of the aorta. Spinal stimulator projected over the mid thoracic  region. Degenerative changes in the spine and shoulders. IMPRESSION: Bilateral perihilar and right basilar infiltrates may indicate edema or pneumonia. Electronically Signed   By: Lucienne Capers M.D.   On: 09/02/2015 06:51    EKG: Independently reviewed.  SR without acute changes  Assessment/Plan Active Problems:   Tobacco abuse   OSA on CPAP   Diabetes mellitus type 2, uncontrolled (HCC)   Dehydration   Lactic acidosis   Community acquired pneumonia   Acute respiratory failure with hypoxia ([place])  1. Sepsis, secondary to CAP. Lactic acid 2.67. Blood cultures have been sent. Continue empiric antibiotics with Rocephin and Azithromycin.  2. CAP. CXR revealed bilateral perihilar and right basilar infiltrates. WBC elevated. Follow cultures. Continue empiric abx.  3. Acute respiratory failure with hypoxia. Secondary to CAP, she may have a component of COPD. Currently on BiPAP, wean O2 as tolerated. Will check ABG. If respiratory status does not improve, she may need intubation. BNP in normal range. 4. Lactic acidosis. Patient has an elevated lactic acid and serum bicarb of 11. ABG is currently pending. This may be related to sepsis. Metformin may also be contributing. She has been started on aggressive IV hydration. Repeat lactic acid as scheduled.  5. Hypokalemia. Replaced. 6. HTN. Hold due to soft BPs  7. DM type 2. Start on SSI. Hold metformin. 8. OSA on CPAP. 9. Anxiety, panic attacks. Xanax PRN.  10. Tobacco abuse. Counseled on the importance of cessation.   DVT prophylaxis: Lovenox Code Status: Full Family Communication: Discussed with patient. Discussed with son via telephone. Disposition Plan: Admit to stepdown. Consults called: none Admission status: Admit as inpatient.   Kathie Dike,  MD [facility] Pager [phone]  If 7PM-7AM, please contact night-coverage [email or web address] Password [email or web address]  08/31/2015, 9:57 AM   By signing my name below, I, Delene Ruffini,  attest that this documentation has been prepared under the direction and in the presence of Kathie Dike, MD. Electronically Signed: Delene Ruffini 09/19/2015 9:30am  I, Dr. Kathie Dike, personally performed the services described in this documentaiton. All medical record entries made by  the scribe were at my direction and in my presence. I have reviewed the chart and agree that the record reflects my personal performance and is accurate and complete  Kathie Dike, MD, 09/11/2015 10:29 AM  Addendum 16:00:  Patient remains tachypneic with respiratory rate in the 30s to 40s. She has a metabolic acidosis and was started on a bicarbonate infusion. PH is since improved, but respiratory status appears unchanged. She still on BiPAP with increased respiratory effort. Case was discussed with Dr. Lake Bells on call for critical care who recommended the patient be intubated. He also recommends the patient be transferred to [facility] for further critical care. Discussed with son who is in agreement. I also informed the patient who agrees. Will call anesthesia for intubation. Continue current treatments.  MEMON,JEHANZEB

## 2015-09-08 NOTE — ED Notes (Addendum)
Respiratory at bedside, called up to nurse on floor , Leanne to get order for bipap. Pt was 77 percent due to fighting the bipap mask,    Pt placed on bipap and is now at 100%.

## 2015-09-08 NOTE — ED Notes (Signed)
Timeout made before running antibiotics with lauren robinson,rn.  Blood cultures drawn.

## 2015-09-08 NOTE — Progress Notes (Signed)
Pharmacy Antibiotic Note  Rebecca Richards is a 70 y.o. female admitted on 09/22/2015 with pneumonia.  Pharmacy has been consulted for azithromycin and rocephin dosing. Initial doses have been ordered in the ED  Plan: Cont azithromycin 500 IV daily and rocephin 1 gm IV daily. Pharmacy will sign off as no adjustment is needed for renal function  Height: 5\' 1"  (154.9 cm) Weight: 163 lb (73.936 kg) IBW/kg (Calculated) : 47.8  Temp (24hrs), Avg:97.5 F (36.4 C), Min:97.5 F (36.4 C), Max:97.5 F (36.4 C)   Recent Labs Lab 09/23/2015 0628  WBC 19.3*  CREATININE 1.57*    Estimated Creatinine Clearance: 31.9 mL/min (by C-G formula based on Cr of 1.57).    No Known Allergies  Antimicrobials this admission: rocephin 6/14 >>  azithromycin 6/14 >>    Thank you for allowing pharmacy to be a part of this patient's care.  Beverlee Nims 09/16/2015 7:53 AM

## 2015-09-09 ENCOUNTER — Inpatient Hospital Stay (HOSPITAL_COMMUNITY): Payer: Medicare HMO

## 2015-09-09 DIAGNOSIS — J9600 Acute respiratory failure, unspecified whether with hypoxia or hypercapnia: Secondary | ICD-10-CM | POA: Insufficient documentation

## 2015-09-09 LAB — COMPREHENSIVE METABOLIC PANEL
ALBUMIN: 2.9 g/dL — AB (ref 3.5–5.0)
ALK PHOS: 94 U/L (ref 38–126)
ALT: 16 U/L (ref 14–54)
ANION GAP: 7 (ref 5–15)
AST: 55 U/L — ABNORMAL HIGH (ref 15–41)
BILIRUBIN TOTAL: 0.6 mg/dL (ref 0.3–1.2)
BUN: 37 mg/dL — ABNORMAL HIGH (ref 6–20)
CALCIUM: 7.7 mg/dL — AB (ref 8.9–10.3)
CO2: 21 mmol/L — ABNORMAL LOW (ref 22–32)
Chloride: 110 mmol/L (ref 101–111)
Creatinine, Ser: 1.13 mg/dL — ABNORMAL HIGH (ref 0.44–1.00)
GFR calc Af Amer: 57 mL/min — ABNORMAL LOW (ref >60)
GFR, EST NON AFRICAN AMERICAN: 49 mL/min — AB (ref >60)
GLUCOSE: 149 mg/dL — AB (ref 65–99)
Potassium: 3 mmol/L — ABNORMAL LOW (ref 3.5–5.1)
Sodium: 138 mmol/L (ref 135–145)
TOTAL PROTEIN: 6.6 g/dL (ref 6.5–8.1)

## 2015-09-09 LAB — ECHOCARDIOGRAM COMPLETE
E decel time: 182 msec
E/e' ratio: 15.68
FS: 39 % (ref 28–44)
Height: 61 in
IVS/LV PW RATIO, ED: 0.99
LA ID, A-P, ES: 32 mm
LA diam end sys: 32 mm
LADIAMINDEX: 1.76 cm/m2
LAVOLA4C: 25.3 mL
LVEEAVG: 15.68
LVEEMED: 15.68
LVELAT: 7.4 cm/s
LVOT area: 2.84 cm2
LVOT diameter: 19 mm
MV Dec: 182
MV pk A vel: 117 m/s
MVPG: 5 mmHg
MVPKEVEL: 116 m/s
PW: 13.1 mm — AB (ref 0.6–1.1)
RV LATERAL S' VELOCITY: 17.3 cm/s
RV TAPSE: 20 mm
RV sys press: 46 mmHg
Reg peak vel: 327 cm/s
TDI e' lateral: 7.4
TDI e' medial: 7.83
TR max vel: 327 cm/s
Weight: 2624.36 oz

## 2015-09-09 LAB — CBC
HEMATOCRIT: 30.8 % — AB (ref 36.0–46.0)
HEMOGLOBIN: 10.2 g/dL — AB (ref 12.0–15.0)
MCH: 27.4 pg (ref 26.0–34.0)
MCHC: 33.1 g/dL (ref 30.0–36.0)
MCV: 82.8 fL (ref 78.0–100.0)
Platelets: 380 10*3/uL (ref 150–400)
RBC: 3.72 MIL/uL — AB (ref 3.87–5.11)
RDW: 18 % — ABNORMAL HIGH (ref 11.5–15.5)
WBC: 15.3 10*3/uL — ABNORMAL HIGH (ref 4.0–10.5)

## 2015-09-09 LAB — BLOOD GAS, ARTERIAL
Acid-base deficit: 1.3 mmol/L (ref 0.0–2.0)
BICARBONATE: 22.1 meq/L (ref 20.0–24.0)
Drawn by: 331471
FIO2: 0.8
MECHVT: 290 mL
O2 Saturation: 93.5 %
PATIENT TEMPERATURE: 98.6
PCO2 ART: 34.1 mmHg — AB (ref 35.0–45.0)
PEEP: 8 cmH2O
PO2 ART: 76.9 mmHg — AB (ref 80.0–100.0)
RATE: 18 resp/min
TCO2: 20.3 mmol/L (ref 0–100)
pH, Arterial: 7.427 (ref 7.350–7.450)

## 2015-09-09 LAB — HIV ANTIBODY (ROUTINE TESTING W REFLEX): HIV Screen 4th Generation wRfx: NONREACTIVE

## 2015-09-09 LAB — LEGIONELLA PNEUMOPHILA SEROGP 1 UR AG: L. pneumophila Serogp 1 Ur Ag: NEGATIVE

## 2015-09-09 LAB — URINE CULTURE: CULTURE: NO GROWTH

## 2015-09-09 LAB — GLUCOSE, CAPILLARY
GLUCOSE-CAPILLARY: 102 mg/dL — AB (ref 65–99)
GLUCOSE-CAPILLARY: 129 mg/dL — AB (ref 65–99)
Glucose-Capillary: 100 mg/dL — ABNORMAL HIGH (ref 65–99)
Glucose-Capillary: 123 mg/dL — ABNORMAL HIGH (ref 65–99)
Glucose-Capillary: 161 mg/dL — ABNORMAL HIGH (ref 65–99)
Glucose-Capillary: 168 mg/dL — ABNORMAL HIGH (ref 65–99)
Glucose-Capillary: 70 mg/dL (ref 65–99)

## 2015-09-09 LAB — CHLORIDE, URINE, RANDOM: CHLORIDE URINE: 44 mmol/L

## 2015-09-09 LAB — STREP PNEUMONIAE URINARY ANTIGEN: Strep Pneumo Urinary Antigen: NEGATIVE

## 2015-09-09 LAB — NA AND K (SODIUM & POTASSIUM), RAND UR
Potassium Urine: 52 mmol/L
Sodium, Ur: 49 mmol/L

## 2015-09-09 LAB — MAGNESIUM: MAGNESIUM: 2.6 mg/dL — AB (ref 1.7–2.4)

## 2015-09-09 LAB — PROCALCITONIN: Procalcitonin: 0.73 ng/mL

## 2015-09-09 LAB — PHOSPHORUS: Phosphorus: 1.6 mg/dL — ABNORMAL LOW (ref 2.5–4.6)

## 2015-09-09 LAB — LACTIC ACID, PLASMA: LACTIC ACID, VENOUS: 1 mmol/L (ref 0.5–2.0)

## 2015-09-09 MED ORDER — FENTANYL CITRATE (PF) 100 MCG/2ML IJ SOLN: 50.0000 ug | Freq: Once | INTRAMUSCULAR | Status: DC

## 2015-09-09 MED ORDER — VITAL HIGH PROTEIN PO LIQD
1000.0000 mL | ORAL | Status: DC
  Administered 2015-09-09 – 2015-09-15 (×6): 1000 mL
  Filled 2015-09-09 (×10): qty 1000

## 2015-09-09 MED ORDER — SODIUM CHLORIDE 0.9 % IV BOLUS (SEPSIS)
500.0000 mL | Freq: Once | INTRAVENOUS | Status: AC
  Administered 2015-09-09: 500 mL via INTRAVENOUS

## 2015-09-09 MED ORDER — PREGABALIN 50 MG PO CAPS
100.0000 mg | ORAL_CAPSULE | Freq: Every day | ORAL | Status: DC
  Administered 2015-09-09 – 2015-09-25 (×17): 100 mg
  Filled 2015-09-09 (×17): qty 2

## 2015-09-09 MED ORDER — FENTANYL BOLUS VIA INFUSION
25.0000 ug | INTRAVENOUS | Status: DC | PRN
  Filled 2015-09-09: qty 25

## 2015-09-09 MED ORDER — PREGABALIN 75 MG PO CAPS
150.0000 mg | ORAL_CAPSULE | Freq: Every day | ORAL | Status: DC
  Administered 2015-09-09 – 2015-09-24 (×16): 150 mg
  Filled 2015-09-09 (×17): qty 2

## 2015-09-09 MED ORDER — FUROSEMIDE 10 MG/ML IJ SOLN
40.0000 mg | Freq: Once | INTRAMUSCULAR | Status: AC
  Administered 2015-09-09: 40 mg via INTRAVENOUS
  Filled 2015-09-09: qty 4

## 2015-09-09 MED ORDER — HEPARIN SODIUM (PORCINE) 5000 UNIT/ML IJ SOLN
5000.0000 [IU] | Freq: Three times a day (TID) | INTRAMUSCULAR | Status: DC
Start: 2015-09-09 — End: 2015-09-26
  Administered 2015-09-09 – 2015-09-26 (×52): 5000 [IU] via SUBCUTANEOUS
  Filled 2015-09-09 (×52): qty 1

## 2015-09-09 MED ORDER — STERILE WATER FOR INJECTION IV SOLN
INTRAVENOUS | Status: DC
  Administered 2015-09-09: 03:00:00 via INTRAVENOUS
  Filled 2015-09-09: qty 850

## 2015-09-09 MED ORDER — PRO-STAT SUGAR FREE PO LIQD
30.0000 mL | Freq: Two times a day (BID) | ORAL | Status: DC
  Administered 2015-09-09 (×2): 30 mL
  Filled 2015-09-09 (×2): qty 30

## 2015-09-09 MED ORDER — SODIUM CHLORIDE 0.9 % IV SOLN
0.0000 mg/h | INTRAVENOUS | Status: DC
  Administered 2015-09-09: 5 mg/h via INTRAVENOUS
  Administered 2015-09-09: 4 mg/h via INTRAVENOUS
  Administered 2015-09-10: 6 mg/h via INTRAVENOUS
  Administered 2015-09-10 (×2): 5 mg/h via INTRAVENOUS
  Administered 2015-09-11: 3 mg/h via INTRAVENOUS
  Administered 2015-09-11: 4 mg/h via INTRAVENOUS
  Administered 2015-09-11 (×2): 2 mg/h via INTRAVENOUS
  Administered 2015-09-12: 4 mg/h via INTRAVENOUS
  Filled 2015-09-09 (×9): qty 10

## 2015-09-09 MED ORDER — SODIUM CHLORIDE 0.9 % IV SOLN
25.0000 ug/h | INTRAVENOUS | Status: DC
  Administered 2015-09-09: 50 ug/h via INTRAVENOUS
  Administered 2015-09-09: 75 ug/h via INTRAVENOUS
  Administered 2015-09-11: 100 ug/h via INTRAVENOUS
  Administered 2015-09-11: 25 ug/h via INTRAVENOUS
  Administered 2015-09-12 (×2): 100 ug/h via INTRAVENOUS
  Filled 2015-09-09 (×5): qty 50

## 2015-09-09 MED ORDER — POTASSIUM CHLORIDE 20 MEQ/15ML (10%) PO SOLN
40.0000 meq | Freq: Two times a day (BID) | ORAL | Status: AC
  Administered 2015-09-09 (×2): 40 meq
  Filled 2015-09-09 (×2): qty 30

## 2015-09-09 MED FILL — Medication: Qty: 1 | Status: AC

## 2015-09-09 NOTE — Procedures (Signed)
Per Dr. Ashok Cordia, once patient's FIO2 is 60%, RT to obtain ABG.  RT unable to wean FIO2 below 70% at this time due to desaturations below targeted goal per MD order. RT will continue to try to wean FIO2 until ABG can be obtained.

## 2015-09-09 NOTE — Progress Notes (Signed)
PULMONARY / CRITICAL CARE MEDICINE   Name: Rebecca Richards MRN: [id number] DOB: 01-11-1946    ADMISSION DATE:  09/07/2015 CONSULTATION DATE:  09/02/2015  REFERRING MD:  Kathie Dike, M.D. / [facility]  CHIEF COMPLAINT:  Dyspnea  HISTORY OF PRESENT ILLNESS:   70 y/o female with a past medical history of obstructive sleep apnea, diabetes mellitus type 2 came to the [facility] on 6/14 complaining of dyspnea for three days.  She was admitted by the hospitalist service with presumed Community Acquired Pneumonia causing acute respiratory failure with hypoxemia and presumed sepsis.  She was treated with IV fluids, IV ceftriaxone and azithromycin and BIPAP and admitted to the intensive care unit.  She was noted to have a severe respiratory acidosis on admission.  Despite the interventions from the Hospitalist team the patient's tachypnea persisted throughout the evening until ultimately she required intubation due to ongoing tachypnea.  Because there was no pulmonary and critical care support at [facility], she was transferred to [facility] for further evaluation.  PAST MEDICAL HISTORY :  Past Medical History  Diagnosis Date  . Brain tumor [facility])     Surgery for benign brain tumor in the remote past  . Hypertension   . Preop cardiovascular exam     Cardiac clearance for knee surgery, January, 2013  . Edema   . Tobacco abuse   . Ejection fraction     EF 70%, echo, February, 2013  . Sleep apnea     uses Cpap  . Hepatitis 1970's    hep c  . Anxiety   . 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 Nasal congestion   . Diabetes mellitus without complication ([place])   . Insomnia   . Spinal cord stimulator status     for pain   . Chronic knee pain   . Chronic back pain     with radiculopathy bilat legs  . Depression   . Arthritis   . Cancer ([place]) 2006    renal Rt  . Renal disorder     was diagnosed with kidney CA but tumor shrinked without intervention    PAST SURGICAL HISTORY: Past  Surgical History  Procedure Laterality Date  . Abdominal hysterectomy    . Knee surgery    . Nose surgery    . Back surgery      pt has stimulator in lower back and can't have an MRI  . Total knee arthroplasty  07/21/2011    Procedure: TOTAL KNEE ARTHROPLASTY;  Surgeon: Yvette Rack., MD;  Location: [place];  Service: Orthopedics;  Laterality: Left;  . 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 Cholecystectomy N/A 09/30/2012    Procedure: LAPAROSCOPIC CHOLECYSTECTOMY;  Surgeon: Donato Heinz, MD;  Location: [facility];  Service: General;  Laterality: N/A;    No Known Allergies  No current facility-administered medications on file prior to encounter.   Current Outpatient Prescriptions on File Prior to Encounter  Medication Sig  . hydrochlorothiazide (MICROZIDE) 12.5 MG capsule Take 1 capsule by mouth daily.  Marland Kitchen HYDROcodone-acetaminophen (NORCO) 7.5-325 MG tablet One tablet every four hours as needed for pain.  Do not drive car or operate machinery.  Must last 30 days. (Patient taking differently: Take 1 tablet by mouth every 4 (four) hours as needed for moderate pain. One tablet every four hours as needed for pain.  Do not drive car or operate machinery.  Must last 30  days.)  . ibuprofen (ADVIL,MOTRIN) 200 MG tablet Take 200 mg by mouth every 6 (six) hours as needed for mild pain or moderate pain.  . Insulin Detemir (LEVEMIR FLEXTOUCH) 100 UNIT/ML Pen Inject 20 Units into the skin daily at 10 pm.  . metFORMIN (GLUCOPHAGE) 500 MG tablet Take 500 mg by mouth 2 (two) times daily.  . Omega-3 Fatty Acids (FISH OIL) 1000 MG CAPS Take 1 capsule by mouth daily.  . ondansetron (ZOFRAN ODT) 8 MG disintegrating tablet Take 1 tablet (8 mg total) by mouth every 8 (eight) hours as needed for nausea or vomiting.  Marland Kitchen telmisartan (MICARDIS) 80 MG tablet Take 80 mg by mouth daily.    FAMILY HISTORY:  Family History  Problem Relation Age of Onset  . Cancer Mother   . Hypertension Mother   . Anesthesia  problems Neg Hx   . Diabetes Mother   . Diabetes Brother     SOCIAL HISTORY: Social History  Substance Use Topics  . Smoking status: Former Smoker -- 0.50 packs/day for 10 years    Types: Cigarettes    Quit date: 12/26/2013  . Smokeless tobacco: Never Used  . Alcohol Use: No     Comment: none since 03-28-2011    REVIEW OF SYSTEMS:  Unable to obtain given intubation and sedation.  SUBJECTIVE: As above.  VITAL SIGNS: BP 120/63 mmHg  Pulse 103  Temp(Src) 97.3 F (36.3 C) (Axillary)  Resp 32  Ht 5\' 1"  (1.549 m)  Wt 164 lb 0.4 oz (74.4 kg)  BMI 31.01 kg/m2  SpO2 89%  HEMODYNAMICS:    VENTILATOR SETTINGS: Vent Mode:  [-] PRVC FiO2 (%):  [55 %-100 %] 80 % Set Rate:  [12 bmp] 12 bmp Vt Set:  [430 mL] 430 mL PEEP:  [5 cmH20-8 cmH20] 8 cmH20 Plateau Pressure:  [14 cmH20-29 cmH20] 29 cmH20  INTAKE / OUTPUT: I/O last 3 completed shifts: In: 2500.7 [I.V.:2000.7; IV Piggyback:500] Out: 451 [Urine:450; Stool:1]  PHYSICAL EXAMINATION: General: Sedated, No distress Neuro: Unresponsive. PERRL HEENT: ETT in place. No thyromegaly, JVD Cardiovascular: Regular rate and rhythm. No MRG.  Pulmonary: No crackes, No wheezes. Abdomen: Soft. + BS, NT, ND Musculoskeletal: Normal bulk and tone.No edema Skin-  No rash.  LABS:  BMET  Recent Labs Lab 09/12/2015 0628 08/30/2015 1038 09/10/2015 1859  NA 138 138 138  K 3.1* 3.8 3.9  CL 112* 115* 116*  CO2 11* 11* 15*  BUN 36* 34* 33*  CREATININE 1.57* 1.45* 1.39*  GLUCOSE 130* 137* 185*    Electrolytes  Recent Labs Lab 09/18/2015 0628 09/24/2015 1038 09/22/2015 1859 09/09/15 0308  CALCIUM 8.2* 7.3* 7.3*  --   MG  --   --   --  2.6*  PHOS  --   --   --  1.6*    CBC  Recent Labs Lab 09/10/2015 0628 09/10/2015 1038 09/09/15 0308  WBC 19.3* 17.6* 15.3*  HGB 12.0 12.7 10.2*  HCT 35.6* 38.0 30.8*  PLT 385 375 380    Coag's  Recent Labs Lab 09/19/2015 1038  APTT 34  INR 1.47    Sepsis Markers  Recent Labs Lab  09/07/2015 0756 09/04/2015 1038 09/21/2015 1255 09/09/15 0308  LATICACIDVEN 2.67* 1.2 1.0  --   PROCALCITON  --  0.29  --  0.73    ABG  Recent Labs Lab 09/01/2015 1015 09/13/2015 1511 09/07/2015 1817  PHART 7.236* 7.307* 7.201*  PCO2ART 22.5* 25.1* 37.5  PO2ART 79.8* 75.6* 73.2*    Liver Enzymes  Recent Labs Lab 09/03/2015 0755  AST 72*  ALT 17  ALKPHOS 79  BILITOT 0.6  ALBUMIN 3.5    Cardiac Enzymes  Recent Labs Lab 09/07/2015 0628  TROPONINI <0.03    Glucose  Recent Labs Lab 09/07/2015 1147 09/09/2015 1638 09/13/2015 1956 09/09/15 0049 09/09/15 0355 09/09/15 0814  GLUCAP 139* 164* 185* 168* 100* 70    Imaging Dg Chest Port 1 View  09/09/2015  CLINICAL DATA:  Low blood pressures. Respiratory failure. Endotracheal tube position. EXAM: PORTABLE CHEST 1 VIEW COMPARISON:  08/27/2015 FINDINGS: Endotracheal tube tip measures 2.7 cm above the carina. Enteric tube tip is off the field of view but below the left hemidiaphragm. Stimulator leads projected over the mid thoracic spine. Shallow inspiration. Mild cardiac enlargement with pulmonary vascular congestion. Diffuse airspace disease in the lungs likely representing edema. No blunting of costophrenic angles. No pneumothorax. IMPRESSION: Appliances appear in satisfactory position. Cardiac enlargement with bilateral pulmonary edema. Electronically Signed   By: Lucienne Capers M.D.   On: 09/09/2015 03:09   Portable Chest Xray  09/02/2015  CLINICAL DATA:  Respiratory failure.  Intubated. EXAM: PORTABLE CHEST 1 VIEW COMPARISON:  Chest radiograph from earlier today. FINDINGS: Endotracheal tube tip is 1.1 cm above the carina. Enteric tube enters stomach with the tip not seen on this image. Spinal stimulator tip overlies the lower thoracic spine. Stable cardiomediastinal silhouette with mild cardiomegaly. No pneumothorax. No pleural effusion. Severe fluffy parahilar airspace opacities throughout both lungs, significantly worsened.  IMPRESSION: 1. Endotracheal tube tip is 1.1 cm above the carina. Recommend retracting 1 cm. 2. Stable mild cardiomegaly. Significant worsening of severe fluffy parahilar airspace opacities throughout both lungs, most suggestive of severe pulmonary edema. These results will be called to the ordering clinician or representative by the Radiologist Assistant, and communication documented in the PACS or zVision Dashboard. Electronically Signed   By: Ilona Sorrel M.D.   On: 08/27/2015 18:26    STUDIES:  CXR 6/15 > B/L opacities. Unchanged since yesterday  MICROBIOLOGY: MRSA PCR 6/14:  Positive Blood Ctx x2 6/14 >> Urine Ctx 6/14 >> Urine Strep Ag 6/14:  Negative Urine Legionella Ag 6/14 >> Tracheal Asp Ctx 6/15 >>  ANTIBIOTICS: Rocephin 6/14 >> Azithromycin 6/14 >>  SIGNIFICANT EVENTS: 6/14 - Admission to [facility], intubation, transfer to [facility]  LINES/TUBES: OETT 6/14 >> FOLEY 6/14 >> OGT 6/14 >> PIV x2  DISCUSSION: 70 y/o female with a past medical history of obstructive sleep apnea and DM2 admitted on 6/14 with severe CAP leading to sepsis and acute respiratory failure with hypxomia requiring intubation.   ASSESSMENT / PLAN:  PULMONARY A: Acute Hypoxic Respiratory Failure Severe CAP H/O OSA - On CPAP. H/O Tobacco Use. P:   Full vent support  Start ARDS net protocol Duoneb q4hr  CARDIOVASCULAR A:  Borderline Hypotension - Likely due to sepsis. Improved. Troponin I & BNP normal. H/O HTN  P:  Monitor on telemetry  Vitals per unit protocol Follow echo.  Lasix 40 mg once.   RENAL A:   Acute Renal Failure - Improving. Metabolic Acidosis - Improving on Bicarb gtt.  P:   Continue Bicarb gtt @ 100cc/hr Monitor UOP and Cr  GASTROINTESTINAL A:   No acute issues. H/O Hepatitis C  P:   Pepcid IV q12hr Start tube feeds  HEMATOLOGIC/ONCOLOGIC A:   Leukocytosis - Secondary to sepsis. H/O Brain/Pituitary Tumor - Resected 2012. H/O Right Renal  Cancer  P:  SCDs Heparin [place] for DVT prophylaxis  INFECTIOUS A:  Sepsis Severe CAP P:   Empiric Rocephin & Azithromycin Day #2 Follow Pct culture results & urine legionella antigen  ENDOCRINE A:   H/O DM Type 2 H/O Pituitary Tumor - S/P Resection.  P:   Accu-Checks q4hr SSI Hold levimir as BS are low.   NEUROLOGIC A:   Sedation on Ventilator H/O Brain/Pituitary Tumor - Resected 2012. H/O Anxiety/Depression H/O Chronic Pain - S/P Spinal Cord Stimulator.  P:   RASS goal: -2 Fentanyl gtt & IV prn Versed gtt Lyrica bid  FAMILY  - Updates: No family at bedside 6/15. - Inter-disciplinary family meet or Palliative Care meeting due by:  6/21  Additional critical care time- 35 mins.  Marshell Garfinkel MD [place] [facility] Pager [phone] If no answer or after 3pm call: [phone] 09/09/2015, 9:42 AM

## 2015-09-09 NOTE — Progress Notes (Signed)
  Echocardiogram 2D Echocardiogram has been performed.  Jennette Dubin 09/09/2015, 9:48 AM

## 2015-09-09 NOTE — Progress Notes (Addendum)
Initial Nutrition Assessment  DOCUMENTATION CODES:   Obesity unspecified  INTERVENTION:  - Will order Vital High Protein @ 45 mL/hr which will provide 1080 kcal, 94.5 grams of protein, and 903 mL free water.  - RD will follow-up 6/16  NUTRITION DIAGNOSIS:   Inadequate oral intake related to inability to eat as evidenced by NPO status.  GOAL:   Provide needs based on ASPEN/SCCM guidelines  MONITOR:   Vent status, Weight trends, Labs, I & O's  REASON FOR ASSESSMENT:   Consult, Ventilator  ASSESSMENT:   70 y.o. female with medical history significant of HTN, tobacco abuse, DM type 2, OSA on CPAP, presented to the ED via ambulance with complaints of shortness of breath for the last 3 days which significantly worsened last night. She reports an associated cough with production of white sputum as well as chest soreness with deep inhalation, wheeze, and slight fever. She denies any pain while coughing.  Pt seen for new vent. BMI indicates obesity. Pt with OGT to suction at this time, RN states unsure why order to suction remains at this time. No family/visitors present at this time.  Patient is currently intubated on ventilator support MV: 11.3 L/min Temp (24hrs), Avg:98.5 F (36.9 C), Min:97.3 F (36.3 C), Max:99.6 F (37.6 C) Propofol: none  Physical assessment shows no muscle or fat wasting, mild edema present. Per chart review, pt's weight stable x3 months PTA. Noted pt has lost 8 lbs (4.7% body weight) in the past 4 months which is not significant for time frame. TF recommendations outlined above. RD will follow up tomorrow. Medications reviewed. Labs reviewed; Cl: 116 mmol/L, BUN/creatinine elevated, Ca: 7.3 mg/dL, GFR: 44, Mg: 2.6 mg/dL, Phos: 1.6 mg/dL.  IVF: 150 mEq sodium bicarb in sterile water @ 100 mL/hr. Drips: Versed @ 5 mg/hr, Fentanyl @ 100 mcg/hr.   ADDENDUM: Consult for TF initiation and management placed at time of RD originally signing this note. Will order  TF at this time and follow-up 6/16.     Diet Order:  Diet NPO time specified  Skin:  Reviewed, no issues  Last BM:  6/15  Height:   Ht Readings from Last 1 Encounters:  08/28/2015 5\' 1"  (1.549 m)    Weight:   Wt Readings from Last 1 Encounters:  09/09/15 164 lb 0.4 oz (74.4 kg)    Ideal Body Weight:  47.73 kg (kg)  BMI:  Body mass index is 31.01 kg/(m^2).  Estimated Nutritional Needs:   Kcal:  [id number] (11-14 kcal/kg actual body weight)  Protein:  95 grams (2 grams/kg IBW)  Fluid:  > 1L/day  EDUCATION NEEDS:   No education needs identified at this time     Jarome Matin, MS, RD, LDN Inpatient Clinical Dietitian Pager # [phone] After hours/weekend pager # [phone]

## 2015-09-10 ENCOUNTER — Inpatient Hospital Stay (HOSPITAL_COMMUNITY): Payer: Medicare HMO

## 2015-09-10 LAB — BASIC METABOLIC PANEL
Anion gap: 7 (ref 5–15)
BUN: 41 mg/dL — AB (ref 6–20)
CALCIUM: 8.1 mg/dL — AB (ref 8.9–10.3)
CHLORIDE: 112 mmol/L — AB (ref 101–111)
CO2: 23 mmol/L (ref 22–32)
Creatinine, Ser: 1.16 mg/dL — ABNORMAL HIGH (ref 0.44–1.00)
GFR calc Af Amer: 55 mL/min — ABNORMAL LOW (ref >60)
GFR, EST NON AFRICAN AMERICAN: 48 mL/min — AB (ref >60)
Glucose, Bld: 113 mg/dL — ABNORMAL HIGH (ref 65–99)
Potassium: 3.7 mmol/L (ref 3.5–5.1)
SODIUM: 142 mmol/L (ref 135–145)

## 2015-09-10 LAB — BLOOD GAS, ARTERIAL
ACID-BASE EXCESS: 1.5 mmol/L (ref 0.0–2.0)
Acid-base deficit: 1 mmol/L (ref 0.0–2.0)
BICARBONATE: 26.3 meq/L — AB (ref 20.0–24.0)
Bicarbonate: 23.7 mEq/L (ref 20.0–24.0)
DRAWN BY: 422461
Drawn by: 277551
FIO2: 0.6
FIO2: 0.6
LHR: 18 {breaths}/min
MECHVT: 290 mL
O2 SAT: 74.1 %
O2 SAT: 89.8 %
PATIENT TEMPERATURE: 37
PCO2 ART: 45.1 mmHg — AB (ref 35.0–45.0)
PEEP/CPAP: 10 cmH2O
PEEP: 8 cmH2O
PH ART: 7.346 — AB (ref 7.350–7.450)
PH ART: 7.384 (ref 7.350–7.450)
Patient temperature: 101.4
RATE: 18 resp/min
TCO2: 22.2 mmol/L (ref 0–100)
TCO2: 24.5 mmol/L (ref 0–100)
VT: 450 mL
pCO2 arterial: 45.4 mmHg — ABNORMAL HIGH (ref 35.0–45.0)
pO2, Arterial: 45.7 mmHg — ABNORMAL LOW (ref 80.0–100.0)
pO2, Arterial: 59.9 mmHg — ABNORMAL LOW (ref 80.0–100.0)

## 2015-09-10 LAB — CBC
HCT: 27.1 % — ABNORMAL LOW (ref 36.0–46.0)
HEMOGLOBIN: 9.1 g/dL — AB (ref 12.0–15.0)
MCH: 28 pg (ref 26.0–34.0)
MCHC: 33.6 g/dL (ref 30.0–36.0)
MCV: 83.4 fL (ref 78.0–100.0)
PLATELETS: 369 10*3/uL (ref 150–400)
RBC: 3.25 MIL/uL — ABNORMAL LOW (ref 3.87–5.11)
RDW: 18.3 % — AB (ref 11.5–15.5)
WBC: 19.8 10*3/uL — ABNORMAL HIGH (ref 4.0–10.5)

## 2015-09-10 LAB — CBC WITH DIFFERENTIAL/PLATELET
BASOS ABS: 0 10*3/uL (ref 0.0–0.1)
BASOS PCT: 0 %
EOS PCT: 1 %
Eosinophils Absolute: 0.1 10*3/uL (ref 0.0–0.7)
HEMATOCRIT: 29.7 % — AB (ref 36.0–46.0)
Hemoglobin: 10 g/dL — ABNORMAL LOW (ref 12.0–15.0)
Lymphocytes Relative: 8 %
Lymphs Abs: 1.2 10*3/uL (ref 0.7–4.0)
MCH: 27.8 pg (ref 26.0–34.0)
MCHC: 33.7 g/dL (ref 30.0–36.0)
MCV: 82.5 fL (ref 78.0–100.0)
MONO ABS: 1.4 10*3/uL — AB (ref 0.1–1.0)
Monocytes Relative: 10 %
NEUTROS ABS: 12.3 10*3/uL — AB (ref 1.7–7.7)
Neutrophils Relative %: 81 %
PLATELETS: 367 10*3/uL (ref 150–400)
RBC: 3.6 MIL/uL — ABNORMAL LOW (ref 3.87–5.11)
RDW: 18.1 % — AB (ref 11.5–15.5)
WBC: 15 10*3/uL — ABNORMAL HIGH (ref 4.0–10.5)

## 2015-09-10 LAB — PROCALCITONIN: Procalcitonin: 0.52 ng/mL

## 2015-09-10 LAB — GLUCOSE, CAPILLARY
Glucose-Capillary: 101 mg/dL — ABNORMAL HIGH (ref 65–99)
Glucose-Capillary: 105 mg/dL — ABNORMAL HIGH (ref 65–99)
Glucose-Capillary: 138 mg/dL — ABNORMAL HIGH (ref 65–99)
Glucose-Capillary: 153 mg/dL — ABNORMAL HIGH (ref 65–99)
Glucose-Capillary: 112 mg/dL — ABNORMAL HIGH (ref 65–99)
Glucose-Capillary: 170 mg/dL — ABNORMAL HIGH (ref 65–99)

## 2015-09-10 LAB — PHOSPHORUS: PHOSPHORUS: 1.2 mg/dL — AB (ref 2.5–4.6)

## 2015-09-10 LAB — MAGNESIUM: Magnesium: 2.6 mg/dL — ABNORMAL HIGH (ref 1.7–2.4)

## 2015-09-10 MED ORDER — ALBUMIN HUMAN 25 % IV SOLN
12.5000 g | Freq: Once | INTRAVENOUS | Status: AC
  Administered 2015-09-10: 12.5 g via INTRAVENOUS
  Filled 2015-09-10: qty 50

## 2015-09-10 MED ORDER — ACETAMINOPHEN 650 MG RE SUPP
650.0000 mg | RECTAL | Status: DC | PRN
  Administered 2015-09-13 – 2015-09-22 (×8): 650 mg via RECTAL
  Filled 2015-09-10 (×8): qty 1

## 2015-09-10 MED ORDER — FUROSEMIDE 10 MG/ML IJ SOLN
40.0000 mg | Freq: Two times a day (BID) | INTRAMUSCULAR | Status: DC
  Administered 2015-09-10: 40 mg via INTRAVENOUS
  Filled 2015-09-10: qty 4

## 2015-09-10 MED ORDER — ACETAMINOPHEN 160 MG/5ML PO SOLN
650.0000 mg | Freq: Four times a day (QID) | ORAL | Status: DC | PRN
  Administered 2015-09-10 – 2015-09-26 (×24): 650 mg
  Filled 2015-09-10 (×24): qty 20.3

## 2015-09-10 NOTE — Plan of Care (Signed)
Problem: Nutrition: Goal: Adequate nutrition will be maintained Outcome: Progressing Tolerating tube feeds

## 2015-09-10 NOTE — Progress Notes (Signed)
Increased Peep 12 and FIO2 70 due to abg results. Results reported to MD

## 2015-09-10 NOTE — Progress Notes (Signed)
Nutrition Follow-up  DOCUMENTATION CODES:   Obesity unspecified  INTERVENTION:  - Continue Vital High Protein @ 45 mL/hr with 30 mL free water every 4 hours.  - Maintain PEPuP protocol.  - RD will follow-up 6/19.  NUTRITION DIAGNOSIS:   Inadequate oral intake related to inability to eat as evidenced by NPO status. -ongoing  GOAL:   Provide needs based on ASPEN/SCCM guidelines -met with current TF regimen.  MONITOR:   Vent status, TF tolerance, Weight trends, Labs, Skin, I & O's  ASSESSMENT:   70 y.o. female with medical history significant of HTN, tobacco abuse, DM type 2, OSA on CPAP, presented to the ED via ambulance with complaints of shortness of breath for the last 3 days which significantly worsened last night. She reports an associated cough with production of white sputum as well as chest soreness with deep inhalation, wheeze, and slight fever. She denies any pain while coughing.  6/16 Pt with OGT in place currently receiving Vital High Protein @ 45 mL/hr (goal rate) with 30 mL free water every 4 hours. This regimen is providing 1080 kcal, 94.5 grams of protein, and 1023 mL free water; this is meeting estimated needs which are based on admission weight as weight is slightly up since that time.  Patient is currently intubated on ventilator support MV: 8.3 L/min Temp (24hrs), Avg:101.6 F (38.7 C), Min:99.9 F (37.7 C), Max:102.6 F (39.2 C) Propofol: none  RD will follow-up 6/19. Continue current TF order and PEPuP protocol. Medications reivewed. Labs reviewed; Cl: 112 mmol/L, BUN/creatinine elevated and trending up, Ca: 8.1 mg/dL, Phos: 1.2 mg/dL, Mg: 2.6 mg/dL, GFR: 55 mL/min.  Drips: Versed @ 6 mg/hr, Fentanyl @ 75 mcg/hr.     6/15 - Pt with OGT to suction at this time, RN states unsure why order to suction remains at this time.  - No family/visitors present at this time. -Pt is currently intubated on ventilator support; MV: 11.3 L/min; Temp (24hrs),  Max:99.6  F (37.6 C); Propofol: none - Physical assessment shows no muscle or fat wasting, mild edema present.  - Per chart review, pt's weight stable x3 months PTA.  - Noted pt has lost 8 lbs (4.7% body weight) in the past 4 months which is not significant for time frame.    Diet Order:  Diet NPO time specified  Skin:  Reviewed, no issues  Last BM:  6/15  Height:   Ht Readings from Last 1 Encounters:  09/15/2015 5' 1" (1.549 m)    Weight:   Wt Readings from Last 1 Encounters:  09/10/15 167 lb 12.3 oz (76.1 kg)    Ideal Body Weight:  47.73 kg (kg)  BMI:  Body mass index is 31.72 kg/(m^2).  Estimated Nutritional Needs:   Kcal:  [phone] (11-14 kcal/kg actual body weight)  Protein:  95 grams (2 grams/kg IBW)  Fluid:  > 1L/day  EDUCATION NEEDS:   No education needs identified at this time     Jarome Matin, MS, RD, LDN Inpatient Clinical Dietitian Pager # [phone] After hours/weekend pager # [phone]

## 2015-09-10 NOTE — Progress Notes (Signed)
PULMONARY / CRITICAL CARE MEDICINE   Name: Rebecca Richards MRN: [id number] DOB: 12-06-1945    ADMISSION DATE:  09/10/2015 CONSULTATION DATE:  09/10/2015  REFERRING MD:  Kathie Dike, M.D. / [facility]  CHIEF COMPLAINT:  Dyspnea  HISTORY OF PRESENT ILLNESS:   70 y/o female with a past medical history of obstructive sleep apnea, diabetes mellitus type 2 came to the [facility] on 6/14 complaining of dyspnea for three days.  She was admitted by the hospitalist service with presumed Community Acquired Pneumonia causing acute respiratory failure with hypoxemia and presumed sepsis.  She was treated with IV fluids, IV ceftriaxone and azithromycin and BIPAP and admitted to the intensive care unit.  She was noted to have a severe respiratory acidosis on admission.  Despite the interventions from the Hospitalist team the patient's tachypnea persisted throughout the evening until ultimately she required intubation due to ongoing tachypnea.  Because there was no pulmonary and critical care support at [facility], she was transferred to [facility] for further evaluation.  PAST MEDICAL HISTORY :  Past Medical History  Diagnosis Date  . Brain tumor [facility])     Surgery for benign brain tumor in the remote past  . Hypertension   . Preop cardiovascular exam     Cardiac clearance for knee surgery, January, 2013  . Edema   . Tobacco abuse   . Ejection fraction     EF 70%, echo, February, 2013  . Sleep apnea     uses Cpap  . Hepatitis 1970's    hep c  . Anxiety   . 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 Nasal congestion   . Diabetes mellitus without complication ([place])   . Insomnia   . Spinal cord stimulator status     for pain   . Chronic knee pain   . Chronic back pain     with radiculopathy bilat legs  . Depression   . Arthritis   . Cancer ([place]) 2006    renal Rt  . Renal disorder     was diagnosed with kidney CA but tumor shrinked without intervention    PAST SURGICAL HISTORY: Past  Surgical History  Procedure Laterality Date  . Abdominal hysterectomy    . Knee surgery    . Nose surgery    . Back surgery      pt has stimulator in lower back and can't have an MRI  . Total knee arthroplasty  07/21/2011    Procedure: TOTAL KNEE ARTHROPLASTY;  Surgeon: Yvette Rack., MD;  Location: [facility];  Service: Orthopedics;  Laterality: Left;  . 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 Cholecystectomy N/A 09/30/2012    Procedure: LAPAROSCOPIC CHOLECYSTECTOMY;  Surgeon: Donato Heinz, MD;  Location: [facility];  Service: General;  Laterality: N/A;    Allergies  Allergen Reactions  . Lactose Intolerance (Gi)     No current facility-administered medications on file prior to encounter.   Current Outpatient Prescriptions on File Prior to Encounter  Medication Sig  . hydrochlorothiazide (MICROZIDE) 12.5 MG capsule Take 1 capsule by mouth daily.  Marland Kitchen HYDROcodone-acetaminophen (NORCO) 7.5-325 MG tablet One tablet every four hours as needed for pain.  Do not drive car or operate machinery.  Must last 30 days. (Patient taking differently: Take 1 tablet by mouth every 4 (four) hours as needed for moderate pain. One tablet every four hours as needed for pain.  Do not  drive car or operate machinery.  Must last 30 days.)  . ibuprofen (ADVIL,MOTRIN) 200 MG tablet Take 200 mg by mouth every 6 (six) hours as needed for mild pain or moderate pain.  . Insulin Detemir (LEVEMIR FLEXTOUCH) 100 UNIT/ML Pen Inject 20 Units into the skin daily at 10 pm.  . metFORMIN (GLUCOPHAGE) 500 MG tablet Take 500 mg by mouth 2 (two) times daily.  . Omega-3 Fatty Acids (FISH OIL) 1000 MG CAPS Take 1 capsule by mouth daily.  . ondansetron (ZOFRAN ODT) 8 MG disintegrating tablet Take 1 tablet (8 mg total) by mouth every 8 (eight) hours as needed for nausea or vomiting.  Marland Kitchen telmisartan (MICARDIS) 80 MG tablet Take 80 mg by mouth daily.    FAMILY HISTORY:  Family History  Problem Relation Age of Onset  . Cancer Mother    . Hypertension Mother   . Anesthesia problems Neg Hx   . Diabetes Mother   . Diabetes Brother     SOCIAL HISTORY: Social History  Substance Use Topics  . Smoking status: Former Smoker -- 0.50 packs/day for 10 years    Types: Cigarettes    Quit date: 12/26/2013  . Smokeless tobacco: Never Used  . Alcohol Use: No     Comment: none since 03-28-2011    REVIEW OF SYSTEMS:  Unable to obtain given intubation and sedation.  SUBJECTIVE: As above.  VITAL SIGNS: BP 107/58 mmHg  Pulse 102  Temp(Src) 102 F (38.9 C) (Axillary)  Resp 32  Ht 5\' 1"  (1.549 m)  Wt 167 lb 12.3 oz (76.1 kg)  BMI 31.72 kg/m2  SpO2 100%  HEMODYNAMICS:    VENTILATOR SETTINGS: Vent Mode:  [-] PRVC FiO2 (%):  [50 %-100 %] 80 % Set Rate:  [18 bmp] 18 bmp Vt Set:  [290 mL] 290 mL PEEP:  [8 cmH20] 8 cmH20 Plateau Pressure:  [21 cmH20-28 cmH20] 21 cmH20  INTAKE / OUTPUT: I/O last 3 completed shifts: In: 4645.5 [I.V.:2540.3; Other:60; LW:[id number]; IV [id number] Out: 2626 [Urine:2425; Emesis/NG output:200; Stool:1]  PHYSICAL EXAMINATION: General: Sedated, No distress Neuro: Unresponsive. PERRL HEENT: ETT in place. No thyromegaly, JVD Cardiovascular: Regular rate and rhythm. No MRG.  Pulmonary: No crackes, No wheezes. Abdomen: Soft. + BS, NT, ND Musculoskeletal: Normal bulk and tone.No edema Skin-  No rash.  LABS:  BMET  Recent Labs Lab 09/01/2015 1859 09/09/15 1000 09/10/15 0316  NA 138 138 142  K 3.9 3.0* 3.7  CL 116* 110 112*  CO2 15* 21* 23  BUN 33* 37* 41*  CREATININE 1.39* 1.13* 1.16*  GLUCOSE 185* 149* 113*    Electrolytes  Recent Labs Lab 09/02/2015 1859 09/09/15 0308 09/09/15 1000 09/10/15 0316  CALCIUM 7.3*  --  7.7* 8.1*  MG  --  2.6*  --  2.6*  PHOS  --  1.6*  --  1.2*    CBC  Recent Labs Lab 09/09/15 0308 09/10/15 0316 09/10/15 0528  WBC 15.3* 19.8* 15.0*  HGB 10.2* 9.1* 10.0*  HCT 30.8* 27.1* 29.7*  PLT 380 369 367    Coag's  Recent  Labs Lab 09/24/2015 1038  APTT 34  INR 1.47    Sepsis Markers  Recent Labs Lab 09/06/2015 1038 09/10/2015 1255 09/09/15 0308 09/09/15 1000 09/10/15 0316  LATICACIDVEN 1.2 1.0  --  1.0  --   PROCALCITON 0.29  --  0.73  --  0.52    ABG  Recent Labs Lab 08/29/2015 1817 09/09/15 1055 09/10/15 0412  PHART 7.201* 7.427 7.346*  PCO2ART  37.5 34.1* 45.4*  PO2ART 73.2* 76.9* 45.7*    Liver Enzymes  Recent Labs Lab 09/22/2015 0755 09/09/15 1000  AST 72* 55*  ALT 17 16  ALKPHOS 79 94  BILITOT 0.6 0.6  ALBUMIN 3.5 2.9*    Cardiac Enzymes  Recent Labs Lab 09/07/2015 0628  TROPONINI <0.03    Glucose  Recent Labs Lab 09/09/15 1157 09/09/15 1647 09/09/15 2018 09/09/15 2345 09/10/15 0417 09/10/15 0731  GLUCAP 102* 129* 101* 123* 105* 138*    Imaging Dg Chest Port 1 View  09/10/2015  CLINICAL DATA:  Respiratory failure. EXAM: PORTABLE CHEST 1 VIEW COMPARISON:  09/09/2015. FINDINGS: Endotracheal tube and NG tube in stable position. Persistent cardiomegaly. Interim slight clearing of bilateral pulmonary infiltrates consistent with slight clearing of pulmonary edema. Tiny bilateral pleural effusions cannot be excluded. No pneumothorax. Neurostimulator noted. IMPRESSION: 1.  Lines and tubes in stable position. 2. Persistent cardiomegaly. Interim slight clearing of bilateral pulmonary infiltrates consistent with slight clearing of pulmonary edema. Tiny bilateral pleural effusions cannot be excluded. Electronically Signed   By: Marcello Moores  Register   On: 09/10/2015 06:57    STUDIES:  CXR 6/16 > B/L opacities. Slightly improved. Images reviewed  MICROBIOLOGY: MRSA PCR 6/14:  Positive Blood Ctx x2 6/14 >> No growth Urine Ctx 6/14 >> No growth Urine Strep Ag 6/14:  Negative Urine Legionella Ag 6/14 >> Negative Tracheal Asp Ctx 6/15 >> pending  ANTIBIOTICS: Rocephin 6/14 >> Azithromycin 6/14 >>  SIGNIFICANT EVENTS: 6/14 - Admission to [facility], intubation, transfer to  [facility]  LINES/TUBES: OETT 6/14 >> FOLEY 6/14 >> OGT 6/14 >> PIV x2  DISCUSSION: 69 y/o female with a past medical history of obstructive sleep apnea and DM2 admitted on 6/14 with severe CAP leading to sepsis and acute respiratory failure with hypxomia requiring intubation.   ASSESSMENT / PLAN:  PULMONARY A: Acute Hypoxic Respiratory Failure Severe CAP H/O OSA - On CPAP. H/O Tobacco Use. P:   Full vent support  Not able to tolerate 6c/kg. Increase to 8 cc/kg Wean down Fio2 and PEEP. Duoneb q4hr  CARDIOVASCULAR A:  Borderline Hypotension - Likely due to sepsis. Improved. Troponin I & BNP normal. H/O HTN Echo noted with elevated PAP 46. P:  Monitor on telemetry  Vitals per unit protocol Lasix 40 gm IV bid.   RENAL A:   Acute Renal Failure - Improving. Metabolic Acidosis - Improving on Bicarb gtt.  P:   Off Bicarb gtt Monitor UOP and Cr  GASTROINTESTINAL A:   No acute issues. H/O Hepatitis C  P:   Pepcid IV q12hr Continue tube feeds  HEMATOLOGIC/ONCOLOGIC A:   Leukocytosis - Secondary to sepsis. 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  P:   Accu-Checks q4hr SSI levimir  NEUROLOGIC A:   Sedation on Ventilator H/O Brain/Pituitary Tumor - Resected 2012. H/O Anxiety/Depression H/O Chronic Pain - S/P Spinal Cord Stimulator.  P:   RASS goal: -2 Fentanyl gtt & IV prn Versed gtt Lyrica bid  FAMILY  - Updates: No family at bedside 6/16. - Inter-disciplinary family meet or Palliative Care meeting due by:  6/21  Critical care time- 35 mins.  Marshell Garfinkel MD [facility] Pager [phone]  If no answer or after 3pm call: [phone] 09/10/2015, 10:37 AM

## 2015-09-10 NOTE — Progress Notes (Signed)
ETT secure. Pt tongue is swollen small breakdown noted on pts middle and left part of the tongue due to swelling. Mouth moisturizer applied to pts tongue.RN aware. Moved ETT to pts right. No breakdown noted on pts lips

## 2015-09-10 NOTE — Progress Notes (Signed)
eLink Physician-Brief Progress Note Patient Name: Rebecca Richards DOB: 19-Jul-1945 MRN: [id number]   Date of Service  09/10/2015  HPI/Events of Note  Patient with hypotension with BP of 82/49(58).  Has been diuresed and is net negative today over 500 cc.  Currently on high peep settings of 12 on vent with sats of 100%.  eICU Interventions  Plan: Albumin 25% IV times one for BP support given goal of net negative diuresis Will attempt to reduce peep as this may also be contributing to hypotension     Intervention Category Intermediate Interventions: Hypotension - evaluation and management  DETERDING,ELIZABETH 09/10/2015, 9:57 PM

## 2015-09-11 ENCOUNTER — Inpatient Hospital Stay (HOSPITAL_COMMUNITY): Payer: Medicare HMO

## 2015-09-11 LAB — CBC
HCT: 30.9 % — ABNORMAL LOW (ref 36.0–46.0)
Hemoglobin: 9.8 g/dL — ABNORMAL LOW (ref 12.0–15.0)
MCH: 27.6 pg (ref 26.0–34.0)
MCHC: 31.7 g/dL (ref 30.0–36.0)
MCV: 87 fL (ref 78.0–100.0)
PLATELETS: 325 10*3/uL (ref 150–400)
RBC: 3.55 MIL/uL — AB (ref 3.87–5.11)
RDW: 18.3 % — AB (ref 11.5–15.5)
WBC: 18.5 10*3/uL — ABNORMAL HIGH (ref 4.0–10.5)

## 2015-09-11 LAB — BLOOD GAS, ARTERIAL
Acid-Base Excess: 1.3 mmol/L (ref 0.0–2.0)
Bicarbonate: 24.6 mEq/L — ABNORMAL HIGH (ref 20.0–24.0)
DRAWN BY: 422461
FIO2: 0.7
MECHVT: 450 mL
O2 Saturation: 96.8 %
PEEP/CPAP: 8 cmH2O
PO2 ART: 98.2 mmHg (ref 80.0–100.0)
Patient temperature: 100.7
RATE: 18 resp/min
TCO2: 22.9 mmol/L (ref 0–100)
pCO2 arterial: 37.6 mmHg (ref 35.0–45.0)
pH, Arterial: 7.438 (ref 7.350–7.450)

## 2015-09-11 LAB — GLUCOSE, CAPILLARY
GLUCOSE-CAPILLARY: 132 mg/dL — AB (ref 65–99)
Glucose-Capillary: 99 mg/dL (ref 65–99)
Glucose-Capillary: 140 mg/dL — ABNORMAL HIGH (ref 65–99)
Glucose-Capillary: 152 mg/dL — ABNORMAL HIGH (ref 65–99)
Glucose-Capillary: 150 mg/dL — ABNORMAL HIGH (ref 65–99)
Glucose-Capillary: 93 mg/dL (ref 65–99)

## 2015-09-11 LAB — PROCALCITONIN: Procalcitonin: 0.39 ng/mL

## 2015-09-11 LAB — CULTURE, RESPIRATORY W GRAM STAIN

## 2015-09-11 LAB — BASIC METABOLIC PANEL
Anion gap: 11 (ref 5–15)
BUN: 35 mg/dL — AB (ref 6–20)
CALCIUM: 8.2 mg/dL — AB (ref 8.9–10.3)
CO2: 23 mmol/L (ref 22–32)
CREATININE: 1.06 mg/dL — AB (ref 0.44–1.00)
Chloride: 109 mmol/L (ref 101–111)
GFR calc Af Amer: >60 mL/min (ref >60)
GFR, EST NON AFRICAN AMERICAN: 53 mL/min — AB (ref >60)
GLUCOSE: 118 mg/dL — AB (ref 65–99)
POTASSIUM: 3.4 mmol/L — AB (ref 3.5–5.1)
SODIUM: 143 mmol/L (ref 135–145)

## 2015-09-11 LAB — CULTURE, RESPIRATORY
CULTURE: NO GROWTH
SPECIAL REQUESTS: NORMAL

## 2015-09-11 LAB — PHOSPHORUS: PHOSPHORUS: 3.1 mg/dL (ref 2.5–4.6)

## 2015-09-11 LAB — MAGNESIUM: MAGNESIUM: 2.6 mg/dL — AB (ref 1.7–2.4)

## 2015-09-11 MED ORDER — SODIUM CHLORIDE 0.9% FLUSH: 10.0000 mL | INTRAVENOUS | Status: DC | PRN

## 2015-09-11 MED ORDER — SODIUM CHLORIDE 0.9 % IV BOLUS (SEPSIS)
1000.0000 mL | Freq: Once | INTRAVENOUS | Status: AC
  Administered 2015-09-11: 1000 mL via INTRAVENOUS

## 2015-09-11 MED ORDER — MIDAZOLAM BOLUS VIA INFUSION
1.0000 mg | INTRAVENOUS | Status: DC | PRN
  Filled 2015-09-11: qty 4

## 2015-09-11 MED ORDER — SODIUM CHLORIDE 0.9% FLUSH
10.0000 mL | Freq: Two times a day (BID) | INTRAVENOUS | Status: DC
  Administered 2015-09-11 – 2015-09-21 (×19): 10 mL
  Administered 2015-09-21: 20 mL
  Administered 2015-09-22 – 2015-09-25 (×7): 10 mL
  Administered 2015-09-25: 30 mL
  Administered 2015-09-26: 10 mL

## 2015-09-11 MED ORDER — SODIUM CHLORIDE 0.9 % IV BOLUS (SEPSIS)
500.0000 mL | Freq: Once | INTRAVENOUS | Status: AC
  Administered 2015-09-11: 500 mL via INTRAVENOUS

## 2015-09-11 MED ORDER — SENNOSIDES 8.8 MG/5ML PO SYRP
5.0000 mL | ORAL_SOLUTION | Freq: Two times a day (BID) | ORAL | Status: DC
  Administered 2015-09-11 – 2015-09-14 (×6): 5 mL
  Filled 2015-09-11 (×10): qty 5

## 2015-09-11 MED ORDER — POTASSIUM CHLORIDE 20 MEQ/15ML (10%) PO SOLN
40.0000 meq | Freq: Every day | ORAL | Status: DC
  Administered 2015-09-11 – 2015-09-13 (×3): 40 meq
  Filled 2015-09-11 (×4): qty 30

## 2015-09-11 NOTE — Progress Notes (Signed)
eLink Physician-Brief Progress Note Patient Name: SANIAYA FLOREK DOB: 03/01/1946 MRN: [id number]   Date of Service  09/11/2015  HPI/Events of Note  hypotensive  eICU Interventions  Will give fluid bolus NS 1 liter     Intervention Category Major Interventions: Other:  Amanada Philbrick 09/11/2015, 3:58 PM

## 2015-09-11 NOTE — Progress Notes (Signed)
PULMONARY / CRITICAL CARE MEDICINE   Name: Rebecca Richards MRN: [id number] DOB: 1945/05/22    ADMISSION DATE:  09/24/2015 CONSULTATION DATE:  08/31/2015  REFERRING MD:  Kathie Dike, M.D. / [facility]  CHIEF COMPLAINT:  Dyspnea  HISTORY OF PRESENT ILLNESS:   70 y/o female with a past medical history of obstructive sleep apnea, diabetes mellitus type 2 came to the [place] on 6/14 complaining of dyspnea for three days.  She was admitted by the hospitalist service with presumed Community Acquired Pneumonia causing acute respiratory failure with hypoxemia and presumed sepsis.  She was treated with IV fluids, IV ceftriaxone and azithromycin and BIPAP and admitted to the intensive care unit.  She was noted to have a severe respiratory acidosis on admission.  Despite the interventions from the Hospitalist team the patient's tachypnea persisted throughout the evening until ultimately she required intubation due to ongoing tachypnea.  Because there was no pulmonary and critical care support at [facility], she was transferred to [facility] for further evaluation.  SUBJECTIVE: Patient had transient hypotension overnight.be secondary to high PEEP. Given albumin IV times one & P decreased slightly.  REVIEW OF SYSTEMS:  Unable to obtain given intubation and sedation.  VITAL SIGNS: BP 90/45 mmHg  Pulse 100  Temp(Src) 99.4 F (37.4 C) (Oral)  Resp 19  Ht 5\' 1"  (1.549 m)  Wt 161 lb 2.5 oz (73.1 kg)  BMI 30.47 kg/m2  SpO2 100%  HEMODYNAMICS:    VENTILATOR SETTINGS: Vent Mode:  [-] PRVC FiO2 (%):  [60 %-80 %] 60 % Set Rate:  [18 bmp] 18 bmp Vt Set:  [450 mL] 450 mL PEEP:  [8 cmH20-12 cmH20] 8 cmH20 Plateau Pressure:  [19 cmH20-26 cmH20] 19 cmH20  INTAKE / OUTPUT: I/O last 3 completed shifts: In: 3062.7 [I.V.:677.7; NG/GT:1885; IV [id number] Out: 2750 [Urine:2750]  PHYSICAL EXAMINATION: General: Sedated. No acute distress. Nurse at bedside. Neuro: Sedated. Pupils  pinpoint and symmetric. No withdrawal to pain.  HEENT: ETT in place. No scleral icterus or injection. Protruding tongue. Cardiovascular: Regular rate and rhythm. No edema. Unable to appreciate JVD given body habitus.  Pulmonary: Distant breath sounds bilaterally. Symmetric chest wall rise on ventilator. Abdomen: Soft. Protuberant. Hypoactive bowel sounds. Integument: Warm and dry. No rash on exposed skin.  LABS:  BMET  Recent Labs Lab 09/09/15 1000 09/10/15 0316 09/11/15 0328  NA 138 142 143  K 3.0* 3.7 3.4*  CL 110 112* 109  CO2 21* 23 23  BUN 37* 41* 35*  CREATININE 1.13* 1.16* 1.06*  GLUCOSE 149* 113* 118*    Electrolytes  Recent Labs Lab 09/09/15 0308 09/09/15 1000 09/10/15 0316 09/11/15 0328  CALCIUM  --  7.7* 8.1* 8.2*  MG 2.6*  --  2.6* 2.6*  PHOS 1.6*  --  1.2* 3.1    CBC  Recent Labs Lab 09/10/15 0316 09/10/15 0528 09/11/15 0328  WBC 19.8* 15.0* 18.5*  HGB 9.1* 10.0* 9.8*  HCT 27.1* 29.7* 30.9*  PLT 369 367 325    Coag's  Recent Labs Lab 09/11/2015 1038  APTT 34  INR 1.47    Sepsis Markers  Recent Labs Lab 09/09/2015 1038 08/30/2015 1255 09/09/15 0308 09/09/15 1000 09/10/15 0316 09/11/15 0328  LATICACIDVEN 1.2 1.0  --  1.0  --   --   PROCALCITON 0.29  --  0.73  --  0.52 0.39    ABG  Recent Labs Lab 09/10/15 0412 09/10/15 1405 09/11/15 0310  PHART 7.346* 7.384 7.438  PCO2ART 45.4* 45.1* 37.6  PO2ART 45.7* 59.9* 98.2    Liver Enzymes  Recent Labs Lab 08/26/2015 0755 09/09/15 1000  AST 72* 55*  ALT 17 16  ALKPHOS 79 94  BILITOT 0.6 0.6  ALBUMIN 3.5 2.9*    Cardiac Enzymes  Recent Labs Lab 09/13/2015 0628  TROPONINI <0.03    Glucose  Recent Labs Lab 09/10/15 1157 09/10/15 1558 09/10/15 2002 09/11/15 0001 09/11/15 0349 09/11/15 0801  GLUCAP 153* 112* 170* 140* 99 152*    Imaging Dg Chest Port 1 View  09/11/2015  CLINICAL DATA:  Acute respiratory failure. EXAM: PORTABLE CHEST 1 VIEW COMPARISON:   Chest radiograph from one day prior. FINDINGS: Endotracheal tube tip is 4.0 cm above the carina. Enteric tube enters stomach with the tip not seen on this image. Stable cardiomediastinal silhouette with mild cardiomegaly. No pneumothorax. Probable stable trace bilateral pleural effusions. Hazy and linear interstitial opacities throughout both lungs appear slightly worsened. IMPRESSION: 1. Well-positioned support structures as described. 2. Cardiomegaly with worsened hazy and linear interstitial opacities throughout both lungs, favor worsening pulmonary edema. 3. Probable stable trace bilateral pleural effusions. Electronically Signed   By: Ilona Sorrel M.D.   On: 09/11/2015 07:39    STUDIES:  TTE 6/15:  Moderate LVH with EF 70-75%. Normal regional wall motion. Pulmonary artery systolic pressure 46 mmHg. RV normal in size. Port CXR 6/16: B/L opacities. Slightly improved. Images reviewed Port CXR 6/17:  Endotracheal tube 5.2 cm above carina. Enteric feeding tube coursing below diaphragm. Patchy bilateral alveolar opacities.  MICROBIOLOGY: MRSA PCR 6/14:  Positive Blood Ctx x2 6/14 >>  Urine Ctx 6/14:  Negative  Urine Strep Ag 6/14:  Negative Urine Legionella Ag 6/14: Negative Tracheal Asp Ctx 6/15 >>   ANTIBIOTICS: Rocephin 6/14 >> Azithromycin 6/14 >>  SIGNIFICANT EVENTS: 6/14 - Admission to [facility], intubation, transfer to [facility]  LINES/TUBES: OETT 6/14 >> FOLEY 6/14 >> OGT 6/14 >> PIV x2  ASSESSMENT / PLAN:  PULMONARY A: Acute Hypoxic Respiratory Failure  Severe CAP H/O OSA - On CPAP. H/O Tobacco Use.  P:   Full vent support at 8cc/kg IBW - intolerant of 6cc/kg IBW. Wean FiO2 for Sat >94% Duoneb q4hr Not weaning PEEP for now  CARDIOVASCULAR A:  Borderline Hypotension - Likely due to sepsis. Troponin I & BNP normal. H/O HTN  P:  Monitor on telemetry  Vitals per unit protocol D/C Lasix NS 500cc IV bolus PICC line consult for access  RENAL A:    Acute Renal Failure - Improving. Hypokalemia - Mild. Replacing. Metabolic Acidosis - Resolved.  P:   Trending UOP with foley Monitoring electrolytes & renal function daily Replacing electrolytes as indicated KCl 29mEq VT  GASTROINTESTINAL A:   No acute issues. H/O Hepatitis C  P:   Pepcid IV q12hr Continue tube feeds Senna via tube twice a day  HEMATOLOGIC/ONCOLOGIC A:   Leukocytosis - Secondary to sepsis. Anemia - Mild. No signs of active bleeding. 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 H/O Pituitary Tumor - S/P Resection.  P:   Accu-Checks q4hr SSI per Moderate Algorithm Levemir 10u qhs  NEUROLOGIC A:   Sedation on Ventilator H/O Brain/Pituitary Tumor - Resected  2012. H/O Anxiety/Depression H/O Chronic Pain - S/P Spinal Cord Stimulator.  P:   RASS goal: -1 to -2 Fentanyl gtt & IV prn Versed gtt Lyrica bid  FAMILY  - Updates: No family at bedside 6/17.  - Inter-disciplinary family meet or Palliative Care meeting due by:  6/21  TODAY'S SUMMARY:  70 y/o female with a past medical history of obstructive sleep apnea and DM2 admitted on 6/14 with severe CAP leading to sepsis and acute respiratory failure with hypoxemia requiring intubation. Patient continuing to wean on FiO2. Borderline hypotension therefore holding diuresis. Awaiting tracheal aspirate culture as patient continues to have intermittent fever.  External total of 36 minutes of critical care time today caring for the patient and reviewing the patient's electronic medical record.  Sonia Baller Ashok Cordia, M.D. [facility] Pulmonary & Critical Care Pager:  [phone] After 3pm or if no response, call [phone]  09/11/2015, 9:47 AM

## 2015-09-11 NOTE — Progress Notes (Signed)
Peripherally Inserted Central Catheter/Midline Placement  The IV Nurse has discussed with the patient and/or persons authorized to consent for the patient, the purpose of this procedure and the potential benefits and risks involved with this procedure.  The benefits include less needle sticks, lab draws from the catheter and patient may be discharged home with the catheter.  Risks include, but not limited to, infection, bleeding, blood clot (thrombus formation), and puncture of an artery; nerve damage and irregular heat beat.  Alternatives to this procedure were also discussed.  Consent sign by son.  PICC/Midline Placement Documentation  PICC Triple Lumen 09/11/15 PICC Right Brachial 34 cm 0 cm (Active)  Indication for Insertion or Continuance of Line Poor Vasculature-patient has had multiple peripheral attempts or PIVs lasting less than 24 hours 09/11/2015  6:21 PM  Exposed Catheter (cm) 0 cm 09/11/2015  6:21 PM  Site Assessment Clean;Dry;Intact 09/11/2015  6:21 PM  Lumen #1 Status Flushed;Saline locked;Blood return noted 09/11/2015  6:21 PM  Lumen #2 Status Flushed;Saline locked;Blood return noted 09/11/2015  6:21 PM  Lumen #3 Status Flushed;Saline locked;Blood return noted 09/11/2015  6:21 PM  Dressing Type Transparent 09/11/2015  6:21 PM  Dressing Change Due 09/18/15 09/11/2015  6:21 PM       Gordan Payment 09/11/2015, 6:23 PM

## 2015-09-11 NOTE — Progress Notes (Signed)
Wasted 40 mL of fentanyl and 20 mL of versed in the sink with Zoe Suggs RN Katherine Mantle RN

## 2015-09-11 NOTE — Progress Notes (Signed)
Pt becoming asynchronous with vent.  MD aware and ordered bolus infusions of versed and fentanyl.  Increased fentanyl and versed infusion as well.  Will continue to monitor. Glenda Chroman RN

## 2015-09-11 NOTE — Progress Notes (Signed)
Patient not tolerating the vent on the physician's desired RASS goal of -1, E-link MD made aware, verbal order received to change RASS goal to -3.

## 2015-09-12 DIAGNOSIS — E878 Other disorders of electrolyte and fluid balance, not elsewhere classified: Secondary | ICD-10-CM

## 2015-09-12 DIAGNOSIS — I9589 Other hypotension: Secondary | ICD-10-CM

## 2015-09-12 DIAGNOSIS — E870 Hyperosmolality and hypernatremia: Secondary | ICD-10-CM

## 2015-09-12 LAB — CBC WITH DIFFERENTIAL/PLATELET
BASOS ABS: 0 10*3/uL (ref 0.0–0.1)
BASOS PCT: 0 %
EOS PCT: 4 %
Eosinophils Absolute: 0.6 10*3/uL (ref 0.0–0.7)
HCT: 25.4 % — ABNORMAL LOW (ref 36.0–46.0)
Hemoglobin: 8 g/dL — ABNORMAL LOW (ref 12.0–15.0)
LYMPHS PCT: 10 %
Lymphs Abs: 1.6 10*3/uL (ref 0.7–4.0)
MCH: 27.6 pg (ref 26.0–34.0)
MCHC: 31.5 g/dL (ref 30.0–36.0)
MCV: 87.6 fL (ref 78.0–100.0)
MONO ABS: 0.9 10*3/uL (ref 0.1–1.0)
Monocytes Relative: 6 %
NEUTROS ABS: 13.1 10*3/uL — AB (ref 1.7–7.7)
Neutrophils Relative %: 80 %
PLATELETS: 330 10*3/uL (ref 150–400)
RBC: 2.9 MIL/uL — AB (ref 3.87–5.11)
RDW: 18.3 % — AB (ref 11.5–15.5)
WBC: 16.2 10*3/uL — AB (ref 4.0–10.5)

## 2015-09-12 LAB — GLUCOSE, CAPILLARY
GLUCOSE-CAPILLARY: 112 mg/dL — AB (ref 65–99)
GLUCOSE-CAPILLARY: 172 mg/dL — AB (ref 65–99)
Glucose-Capillary: 192 mg/dL — ABNORMAL HIGH (ref 65–99)
Glucose-Capillary: 128 mg/dL — ABNORMAL HIGH (ref 65–99)
Glucose-Capillary: 114 mg/dL — ABNORMAL HIGH (ref 65–99)
Glucose-Capillary: 142 mg/dL — ABNORMAL HIGH (ref 65–99)

## 2015-09-12 LAB — RENAL FUNCTION PANEL
ALBUMIN: 2.5 g/dL — AB (ref 3.5–5.0)
Anion gap: 5 (ref 5–15)
BUN: 31 mg/dL — AB (ref 6–20)
CHLORIDE: 114 mmol/L — AB (ref 101–111)
CO2: 27 mmol/L (ref 22–32)
CREATININE: 0.98 mg/dL (ref 0.44–1.00)
Calcium: 8.1 mg/dL — ABNORMAL LOW (ref 8.9–10.3)
GFR, EST NON AFRICAN AMERICAN: 58 mL/min — AB (ref >60)
Glucose, Bld: 135 mg/dL — ABNORMAL HIGH (ref 65–99)
PHOSPHORUS: 2.4 mg/dL — AB (ref 2.5–4.6)
POTASSIUM: 3.8 mmol/L (ref 3.5–5.1)
Sodium: 146 mmol/L — ABNORMAL HIGH (ref 135–145)

## 2015-09-12 LAB — MAGNESIUM: MAGNESIUM: 2.6 mg/dL — AB (ref 1.7–2.4)

## 2015-09-12 MED ORDER — PHENYLEPHRINE HCL 10 MG/ML IJ SOLN
0.0000 ug/min | INTRAVENOUS | Status: DC
  Filled 2015-09-12: qty 1

## 2015-09-12 MED ORDER — SODIUM CHLORIDE 0.9 % IV BOLUS (SEPSIS)
500.0000 mL | Freq: Once | INTRAVENOUS | Status: AC
  Administered 2015-09-12: 500 mL via INTRAVENOUS

## 2015-09-12 NOTE — Progress Notes (Signed)
PULMONARY / CRITICAL CARE MEDICINE   Name: Rebecca Richards MRN: [id number] DOB: 07-01-1945    ADMISSION DATE:  09/12/2015 CONSULTATION DATE:  09/12/2015  REFERRING MD:  Kathie Dike, M.D. / [facility]  CHIEF COMPLAINT:  Dyspnea  HISTORY OF PRESENT ILLNESS:   70 y/o female with a past medical history of obstructive sleep apnea, diabetes mellitus type 2 came to the [place] on 6/14 complaining of dyspnea for three days.  She was admitted by the hospitalist service with presumed Community Acquired Pneumonia causing acute respiratory failure with hypoxemia and presumed sepsis.  She was treated with IV fluids, IV ceftriaxone and azithromycin and BIPAP and admitted to the intensive care unit.  She was noted to have a severe respiratory acidosis on admission.  Despite the interventions from the Hospitalist team the patient's tachypnea persisted throughout the evening until ultimately she required intubation due to ongoing tachypnea.  Because there was no pulmonary and critical care support at [facility], she was transferred to [facility] for further evaluation.  SUBJECTIVE: Hypotension yesterday and IV fluid bolus administered. Patient having increased problems with adequate sedation and ventilator synchrony. PICC placed yesterday for access.  REVIEW OF SYSTEMS:  Unable to obtain given intubation and sedation.  VITAL SIGNS: BP 138/116 mmHg  Pulse 120  Temp(Src) 99.4 F (37.4 C) (Axillary)  Resp 30  Ht 5\' 1"  (1.549 m)  Wt 175 lb 7.8 oz (79.6 kg)  BMI 33.17 kg/m2  SpO2 91%  HEMODYNAMICS: CVP:  [10 mmHg] 10 mmHg  VENTILATOR SETTINGS: Vent Mode:  [-] PRVC FiO2 (%):  [40 %-60 %] 50 % Set Rate:  [18 bmp] 18 bmp Vt Set:  [450 mL] 450 mL PEEP:  [8 cmH20] 8 cmH20 Plateau Pressure:  [13 cmH20-22 cmH20] 13 cmH20  INTAKE / OUTPUT: I/O last 3 completed shifts: In: 3637.1 [I.V.:517.1; NG/GT:1620; IV Piggyback:1500] Out: [id number] [Urine:1765]  PHYSICAL EXAMINATION: General:  Sedated. No distress. Nurse at bedside. No family at bedside. Neuro: Sedated. Pupils pinpoint and symmetric. No spontaneous movements.  HEENT: ETT in place. No scleral icterus. Protruding tongue unchanged. Cardiovascular: Regular rate and rhythm. No edema. Normal S1 & S2.  Pulmonary: Distant breath sounds bilaterally. Symmetric chest wall rise on ventilator. Abdomen: Soft. Protuberant. Improving bowel sounds. Integument: Warm and dry. No rash on exposed skin.  LABS:  BMET  Recent Labs Lab 09/10/15 0316 09/11/15 0328 09/12/15 0430  NA 142 143 146*  K 3.7 3.4* 3.8  CL 112* 109 114*  CO2 23 23 27   BUN 41* 35* 31*  CREATININE 1.16* 1.06* 0.98  GLUCOSE 113* 118* 135*    Electrolytes  Recent Labs Lab 09/10/15 0316 09/11/15 0328 09/12/15 0430  CALCIUM 8.1* 8.2* 8.1*  MG 2.6* 2.6* 2.6*  PHOS 1.2* 3.1 2.4*    CBC  Recent Labs Lab 09/10/15 0528 09/11/15 0328 09/12/15 0430  WBC 15.0* 18.5* 16.2*  HGB 10.0* 9.8* 8.0*  HCT 29.7* 30.9* 25.4*  PLT 367 325 330    Coag's  Recent Labs Lab 08/31/2015 1038  APTT 34  INR 1.47    Sepsis Markers  Recent Labs Lab 09/06/2015 1038 08/29/2015 1255 09/09/15 0308 09/09/15 1000 09/10/15 0316 09/11/15 0328  LATICACIDVEN 1.2 1.0  --  1.0  --   --   PROCALCITON 0.29  --  0.73  --  0.52 0.39    ABG  Recent Labs Lab 09/10/15 0412 09/10/15 1405 09/11/15 0310  PHART 7.346* 7.384 7.438  PCO2ART 45.4* 45.1* 37.6  PO2ART  45.7* 59.9* 98.2    Liver Enzymes  Recent Labs Lab 09/15/2015 0755 09/09/15 1000 09/12/15 0430  AST 72* 55*  --   ALT 17 16  --   ALKPHOS 79 94  --   BILITOT 0.6 0.6  --   ALBUMIN 3.5 2.9* 2.5*    Cardiac Enzymes  Recent Labs Lab 08/29/2015 0628  TROPONINI <0.03    Glucose  Recent Labs Lab 09/11/15 1204 09/11/15 1634 09/11/15 1925 09/12/15 0008 09/12/15 0406 09/12/15 0747  GLUCAP 93 150* 132* 192* 128* 114*    Imaging No results found.  STUDIES:  TTE 6/15:  Moderate LVH  with EF 70-75%. Normal regional wall motion. Pulmonary artery systolic pressure 46 mmHg. RV normal in size. Port CXR 6/16: B/L opacities. Slightly improved. Images reviewed Port CXR 6/17:  Endotracheal tube 5.2 cm above carina. Enteric feeding tube coursing below diaphragm. Patchy bilateral alveolar opacities.  MICROBIOLOGY: MRSA PCR 6/14:  Positive Blood Ctx x2 6/14 >>  Urine Ctx 6/14:  Negative  Urine Strep Ag 6/14:  Negative Urine Legionella Ag 6/14: Negative Tracheal Asp Ctx 6/15:  Negative Tracheal Asp Ctx 6/18>>   ANTIBIOTICS: Rocephin 6/14 >> Azithromycin 6/14 >>  SIGNIFICANT EVENTS: 6/14 - Admission to [facility], intubation, transfer to [facility]  LINES/TUBES: OETT 6/14>> RUE TL PICC 6/17>> FOLEY 6/14>> OGT 6/14>> PIV x2  ASSESSMENT / PLAN:  PULMONARY A: Acute Hypoxic Respiratory Failure  Severe CAP H/O OSA - On CPAP. H/O Tobacco Use.  P:   Full vent support at 8cc/kg IBW - intolerant of 6cc/kg IBW. Wean FiO2 for Sat >94% Duoneb q4hr   CARDIOVASCULAR A:  Borderline Hypotension - Likely due to sepsis & sedation medication. Troponin I & BNP normal. H/O HTN  P:  Monitor on telemetry  Vitals per unit protocol Neosynephrine IV for MAP >65 Holding on further IVF boluses  RENAL A:   Acute Renal Failure - Resolving. Hypernatremia - Mild. Hyperchloremia - Mild. Likely due to IVF. Hypokalemia - Resolved. Metabolic Acidosis - Resolved.  P:   Trending UOP with foley Monitoring electrolytes & renal function daily Replacing electrolytes as indicated  GASTROINTESTINAL A:   No acute issues. H/O Hepatitis C  P:   Pepcid IV q12hr Continue tube feeds Senna via tube bid  HEMATOLOGIC/ONCOLOGIC A:   Leukocytosis - Secondary to sepsis. Improving. Anemia - Mild. No signs of active bleeding. H/O Brain/Pituitary Tumor - Resected 2012. H/O Right Renal Cancer  P:  Trending cell counts daily w/ CBC SCDs Heparin [place] q8hr  INFECTIOUS A:    Sepsis Severe CAP  P:   Empiric Rocephin & Azithromycin Day #5 Awaiting Repeat Tracheal Asp Ctx from 6/18 Trending PCT per algorithm  ENDOCRINE A:   H/O DM Type 2 - BG controlled. H/O Pituitary Tumor - S/P Resection.  P:   Accu-Checks q4hr SSI per Moderate Algorithm Levemir 10u qhs  NEUROLOGIC A:   Sedation on Ventilator H/O Brain/Pituitary Tumor - Resected 2012. H/O Anxiety/Depression H/O Chronic Pain - S/P Spinal Cord Stimulator.  P:   RASS goal: -1 to -2 Fentanyl gtt & IV prn Versed gtt & IV prn Lyrica bid VT  FAMILY  - Updates: No family at bedside 6/18. Son Barbara Vea updated 6/18 via phone by Dr. Ashok Cordia.  - Inter-disciplinary family meet or Palliative Care meeting due by:  6/21  TODAY'S SUMMARY:  70 y/o female with a past medical history of obstructive sleep apnea and DM2 admitted on 6/14 with severe CAP leading to sepsis and  acute respiratory failure with hypoxemia requiring intubation. Limiting further bolus IV fluids. Ordering vasopressor infusion as needed for blood pressure support. Patient currently appears synchronous on the ventilator. Awaiting repeat tracheal aspirate culture.  External total of 32 minutes of critical care time today caring for the patient and reviewing the patient's electronic medical record.  Sonia Baller Ashok Cordia, M.D. [facility] Pulmonary & Critical Care Pager:  [phone] After 3pm or if no response, call [phone]  09/12/2015, 8:55 AM

## 2015-09-13 ENCOUNTER — Inpatient Hospital Stay (HOSPITAL_COMMUNITY): Payer: Medicare HMO

## 2015-09-13 DIAGNOSIS — Z794 Long term (current) use of insulin: Secondary | ICD-10-CM

## 2015-09-13 DIAGNOSIS — J80 Acute respiratory distress syndrome: Secondary | ICD-10-CM

## 2015-09-13 DIAGNOSIS — E1140 Type 2 diabetes mellitus with diabetic neuropathy, unspecified: Secondary | ICD-10-CM

## 2015-09-13 DIAGNOSIS — E1165 Type 2 diabetes mellitus with hyperglycemia: Secondary | ICD-10-CM

## 2015-09-13 LAB — CULTURE, BLOOD (ROUTINE X 2)
Culture: NO GROWTH
Culture: NO GROWTH

## 2015-09-13 LAB — BLOOD GAS, ARTERIAL
ACID-BASE EXCESS: 2.2 mmol/L — AB (ref 0.0–2.0)
Acid-Base Excess: 2 mmol/L (ref 0.0–2.0)
BICARBONATE: 26.4 meq/L — AB (ref 20.0–24.0)
Bicarbonate: 26.9 mEq/L — ABNORMAL HIGH (ref 20.0–24.0)
DRAWN BY: 235321
DRAWN BY: 276051
FIO2: 0.6
FIO2: 0.6
LHR: 22 {breaths}/min
MECHVT: 450 mL
MECHVT: 450 mL
O2 SAT: 86.6 %
O2 Saturation: 88.3 %
PEEP/CPAP: 14 cmH2O
PEEP: 14 cmH2O
PO2 ART: 52.3 mmHg — AB (ref 80.0–100.0)
PO2 ART: 57.8 mmHg — AB (ref 80.0–100.0)
Patient temperature: 98.7
Patient temperature: 98.6
RATE: 22 resp/min
TCO2: 24.9 mmol/L (ref 0–100)
TCO2: 25.6 mmol/L (ref 0–100)
pCO2 arterial: 42.2 mmHg (ref 35.0–45.0)
pCO2 arterial: 47 mmHg — ABNORMAL HIGH (ref 35.0–45.0)
pH, Arterial: 7.413 (ref 7.350–7.450)
pH, Arterial: 7.376 (ref 7.350–7.450)

## 2015-09-13 LAB — RENAL FUNCTION PANEL
ANION GAP: 7 (ref 5–15)
Albumin: 2.4 g/dL — ABNORMAL LOW (ref 3.5–5.0)
BUN: 26 mg/dL — AB (ref 6–20)
CHLORIDE: 114 mmol/L — AB (ref 101–111)
CO2: 27 mmol/L (ref 22–32)
Calcium: 8.4 mg/dL — ABNORMAL LOW (ref 8.9–10.3)
Creatinine, Ser: 0.83 mg/dL (ref 0.44–1.00)
GFR calc Af Amer: >60 mL/min (ref >60)
Glucose, Bld: 123 mg/dL — ABNORMAL HIGH (ref 65–99)
POTASSIUM: 4.3 mmol/L (ref 3.5–5.1)
Phosphorus: 2.4 mg/dL — ABNORMAL LOW (ref 2.5–4.6)
Sodium: 148 mmol/L — ABNORMAL HIGH (ref 135–145)

## 2015-09-13 LAB — GLUCOSE, CAPILLARY
GLUCOSE-CAPILLARY: 109 mg/dL — AB (ref 65–99)
GLUCOSE-CAPILLARY: 149 mg/dL — AB (ref 65–99)
GLUCOSE-CAPILLARY: 187 mg/dL — AB (ref 65–99)
Glucose-Capillary: 158 mg/dL — ABNORMAL HIGH (ref 65–99)
Glucose-Capillary: 168 mg/dL — ABNORMAL HIGH (ref 65–99)
Glucose-Capillary: 170 mg/dL — ABNORMAL HIGH (ref 65–99)
Glucose-Capillary: 174 mg/dL — ABNORMAL HIGH (ref 65–99)

## 2015-09-13 LAB — CBC WITH DIFFERENTIAL/PLATELET
BASOS ABS: 0 10*3/uL (ref 0.0–0.1)
BASOS PCT: 0 %
Eosinophils Absolute: 0.8 10*3/uL — ABNORMAL HIGH (ref 0.0–0.7)
Eosinophils Relative: 4 %
HEMATOCRIT: 25.6 % — AB (ref 36.0–46.0)
HEMOGLOBIN: 8.2 g/dL — AB (ref 12.0–15.0)
LYMPHS PCT: 6 %
Lymphs Abs: 1.1 10*3/uL (ref 0.7–4.0)
MCH: 28.2 pg (ref 26.0–34.0)
MCHC: 32 g/dL (ref 30.0–36.0)
MCV: 88 fL (ref 78.0–100.0)
MONO ABS: 0.9 10*3/uL (ref 0.1–1.0)
Monocytes Relative: 5 %
NEUTROS ABS: 14.6 10*3/uL — AB (ref 1.7–7.7)
NEUTROS PCT: 85 %
Platelets: 307 10*3/uL (ref 150–400)
RBC: 2.91 MIL/uL — AB (ref 3.87–5.11)
RDW: 18.2 % — ABNORMAL HIGH (ref 11.5–15.5)
WBC: 17.4 10*3/uL — AB (ref 4.0–10.5)

## 2015-09-13 LAB — TRIGLYCERIDES: TRIGLYCERIDES: 168 mg/dL — AB (ref <150)

## 2015-09-13 LAB — MAGNESIUM: MAGNESIUM: 2.4 mg/dL (ref 1.7–2.4)

## 2015-09-13 LAB — PROCALCITONIN: PROCALCITONIN: 0.39 ng/mL

## 2015-09-13 MED ORDER — CEFEPIME HCL 2 G IJ SOLR
2.0000 g | Freq: Once | INTRAMUSCULAR | Status: DC
  Filled 2015-09-13: qty 2

## 2015-09-13 MED ORDER — VANCOMYCIN HCL IN DEXTROSE 1-5 GM/200ML-% IV SOLN
1000.0000 mg | Freq: Two times a day (BID) | INTRAVENOUS | Status: DC
  Administered 2015-09-13 – 2015-09-15 (×4): 1000 mg via INTRAVENOUS
  Filled 2015-09-13 (×5): qty 200

## 2015-09-13 MED ORDER — PROPOFOL 1000 MG/100ML IV EMUL
0.0000 ug/kg/min | INTRAVENOUS | Status: DC
  Administered 2015-09-13: 5 ug/kg/min via INTRAVENOUS
  Administered 2015-09-13: 10 ug/kg/min via INTRAVENOUS
  Administered 2015-09-14: 20 ug/kg/min via INTRAVENOUS
  Administered 2015-09-15: 10 ug/kg/min via INTRAVENOUS
  Administered 2015-09-15 (×2): 30 ug/kg/min via INTRAVENOUS
  Administered 2015-09-16: 12 ug/kg/min via INTRAVENOUS
  Administered 2015-09-16: 20 ug/kg/min via INTRAVENOUS
  Administered 2015-09-18: 25 ug/kg/min via INTRAVENOUS
  Administered 2015-09-18: 20 ug/kg/min via INTRAVENOUS
  Filled 2015-09-13 (×16): qty 100

## 2015-09-13 MED ORDER — MIDAZOLAM HCL 2 MG/2ML IJ SOLN
1.0000 mg | INTRAMUSCULAR | Status: DC | PRN
  Administered 2015-09-18 – 2015-09-22 (×2): 1 mg via INTRAVENOUS
  Filled 2015-09-13 (×3): qty 2

## 2015-09-13 MED ORDER — SODIUM CHLORIDE 0.9 % IV SOLN
25.0000 ug/h | INTRAVENOUS | Status: DC
  Administered 2015-09-13: 75 ug/h via INTRAVENOUS
  Administered 2015-09-15: 125 ug/h via INTRAVENOUS
  Administered 2015-09-15: 50 ug/h via INTRAVENOUS
  Administered 2015-09-15 – 2015-09-16 (×2): 100 ug/h via INTRAVENOUS
  Administered 2015-09-17: 125 ug/h via INTRAVENOUS
  Administered 2015-09-18: 75 ug/h via INTRAVENOUS
  Administered 2015-09-19: 100 ug/h via INTRAVENOUS
  Administered 2015-09-20: 75 ug/h via INTRAVENOUS
  Administered 2015-09-23 (×2): 175 ug/h via INTRAVENOUS
  Administered 2015-09-24: 200 ug/h via INTRAVENOUS
  Administered 2015-09-24: 150 ug/h via INTRAVENOUS
  Filled 2015-09-13 (×15): qty 50

## 2015-09-13 MED ORDER — DEXTROSE 5 % IV SOLN
2.0000 g | Freq: Once | INTRAVENOUS | Status: AC
  Administered 2015-09-13: 2 g via INTRAVENOUS
  Filled 2015-09-13: qty 2

## 2015-09-13 MED ORDER — IPRATROPIUM-ALBUTEROL 0.5-2.5 (3) MG/3ML IN SOLN
3.0000 mL | Freq: Four times a day (QID) | RESPIRATORY_TRACT | Status: DC
  Administered 2015-09-13 – 2015-09-17 (×16): 3 mL via RESPIRATORY_TRACT
  Filled 2015-09-13 (×16): qty 3

## 2015-09-13 MED ORDER — DEXTROSE 5 % IV SOLN
1.0000 g | Freq: Three times a day (TID) | INTRAVENOUS | Status: DC
  Administered 2015-09-13 – 2015-09-15 (×5): 1 g via INTRAVENOUS
  Filled 2015-09-13 (×6): qty 1

## 2015-09-13 MED ORDER — FREE WATER
200.0000 mL | Freq: Four times a day (QID) | Status: DC
  Administered 2015-09-13 – 2015-09-19 (×25): 200 mL

## 2015-09-13 MED ORDER — DEXTROSE 5 % IV SOLN: 1.0000 g | Freq: Three times a day (TID) | INTRAVENOUS | Status: DC

## 2015-09-13 MED ORDER — FUROSEMIDE 10 MG/ML IJ SOLN
40.0000 mg | Freq: Once | INTRAMUSCULAR | Status: AC
  Administered 2015-09-13: 40 mg via INTRAVENOUS
  Filled 2015-09-13: qty 4

## 2015-09-13 MED ORDER — VANCOMYCIN HCL 10 G IV SOLR
1500.0000 mg | Freq: Once | INTRAVENOUS | Status: AC
  Administered 2015-09-13: 1500 mg via INTRAVENOUS
  Filled 2015-09-13: qty 1500

## 2015-09-13 MED ORDER — FUROSEMIDE 10 MG/ML IJ SOLN
40.0000 mg | Freq: Four times a day (QID) | INTRAMUSCULAR | Status: AC
  Administered 2015-09-13 (×2): 40 mg via INTRAVENOUS
  Filled 2015-09-13 (×2): qty 4

## 2015-09-13 MED ORDER — FENTANYL BOLUS VIA INFUSION
25.0000 ug | INTRAVENOUS | Status: DC | PRN
  Administered 2015-09-13 – 2015-09-18 (×11): 25 ug via INTRAVENOUS
  Filled 2015-09-13: qty 25

## 2015-09-13 NOTE — Progress Notes (Signed)
Pharmacy Antibiotic Follow-up Note  Rebecca Richards is a 70 y.o. year-old female admitted on 09/07/2015.  The patient is currently on day 1 of Vancomycin & Cefepime for positive sputum cx: few Staph aureus, ARDS.  Assessment/Plan: Vancomycin 1500mg  x1, then 1gm IV every 12 hours.  Goal trough 15-20 mcg/mL.  Cefepime 2gm x1, then 1gm q8hr  Temp (24hrs), Avg:100.3 F (37.9 C), Min:98.7 F (37.1 C), Max:101.8 F (38.8 C)   Recent Labs Lab 09/10/15 0316 09/10/15 0528 09/11/15 0328 09/12/15 0430 09/13/15 0504  WBC 19.8* 15.0* 18.5* 16.2* 17.4*    Recent Labs Lab 09/09/15 1000 09/10/15 0316 09/11/15 0328 09/12/15 0430 09/13/15 0504  CREATININE 1.13* 1.16* 1.06* 0.98 0.83   Estimated Creatinine Clearance: 62.5 mL/min (by C-G formula based on Cr of 0.83).    Allergies  Allergen Reactions  . Lactose Intolerance (Gi)    Antimicrobials this admission: 6/19 Vanc >>  6/19 Cefepime >>   Levels/dose changes this admission:  Microbiology results: 6/14 BCx: ngtd 6/14 UCx: NGF  6/14 strep pneumo/leg ur ag: neg/IP 6/15 trach asp: ng-final 6/14 MRSA PCR: + 6/14 HIV ab: NR 6/18 Trach asp: few Staph aureus  Thank you for allowing pharmacy to be a part of this patient's care.  Minda Ditto PharmD 09/13/2015 9:42 AM

## 2015-09-13 NOTE — Progress Notes (Signed)
Nutrition Follow-up  DOCUMENTATION CODES:   Obesity unspecified  INTERVENTION:  - Continue Vital High Protein @ 45 mL/hr which is providing 1080 kcal, 94.5 grams of protein, and 903 mL free water. - Free water per MD/NP order (currently 200 mL every 6 hours which is providing 800 mL free water/day). - RD will follow-up 6/21.  NUTRITION DIAGNOSIS:   Inadequate oral intake related to inability to eat as evidenced by NPO status. -ongoing  GOAL:   Provide needs based on ASPEN/SCCM guidelines -met with current TF regimen  MONITOR:   Vent status, TF tolerance, Weight trends, Labs, Skin, I & O's  ASSESSMENT:   70 y.o. female with medical history significant of HTN, tobacco abuse, DM type 2, OSA on CPAP, presented to the ED via ambulance with complaints of shortness of breath for the last 3 days which significantly worsened last night. She reports an associated cough with production of white sputum as well as chest soreness with deep inhalation, wheeze, and slight fever. She denies any pain while coughing.  6/19 Propofol running over the weekend and is currently being held, per RN, for wakeup assessment. RN reports Propofol previously at ~4 mL/hr (106 kcal). No family/visitors at bedside. RN reports pt tolerating TF regimen well; Vital High Protein @ 45 mL/hr with MD order for 200 mL free water every 6 hours. OGT in place. RN reports pt continues without BM but that order in place for Senokot BID via tube. Nutrition needs based on admission weight as weight has been trending up.  Patient is currently intubated on ventilator support MV: 10 L/min Temp (24hrs), Avg:100.3 F (37.9 C), Min:98.7 F (37.1 C), Max:101.8 F (38.8 C) Propofol: currently being held for wake up assessment.  Pt currently meeting needs. RD will follow-up 6/21. Medications reviewed; 40 mEq KCl/day, 40 mg IV Lasix every 6 hours. Labs reviewed; CBGs: 109 and 174 mg/dL this AM, Na: 148 mmol/L, Cl: 114 mmol/L, BUN: 26  mg/dL, Ca: 8.4 mg/dL, Phos: 2.4 mg/dL.    6/16 - Pt with OGT in place currently receiving Vital High Protein @ 45 mL/hr (goal rate) with 30 mL free water every 4 hours.  - This regimen is providing 1080 kcal, 94.5 grams of protein, and 1023 mL free water - Patient is currently intubated on ventilator support; MV: 8.3 L/min; Temp (24hrs), Max:102.6 F (39.2 C); Propofol: none - Drips: Versed @ 6 mg/hr, Fentanyl @ 75 mcg/hr.     6/15 - Pt with OGT to suction at this time, RN states unsure why order to suction remains at this time.  - No family/visitors present at this time. -Pt is currently intubated on ventilator support; MV: 11.3 L/min; Temp (24hrs), Max:99.6 F (37.6 C); Propofol: none - Physical assessment shows no muscle or fat wasting, mild edema present.  - Per chart review, pt's weight stable x3 months PTA.  - Noted pt has lost 8 lbs (4.7% body weight) in the past 4 months which is not significant for time frame.     Diet Order:  Diet NPO time specified  Skin:  Reviewed, no issues  Last BM:  6/15  Height:   Ht Readings from Last 1 Encounters:  09/16/2015 '5\' 1"'  (1.549 m)    Weight:   Wt Readings from Last 1 Encounters:  09/13/15 173 lb 15.1 oz (78.9 kg)    Ideal Body Weight:  47.73 kg (kg)  BMI:  Body mass index is 32.88 kg/(m^2).  Estimated Nutritional Needs:   Kcal:  [phone] (11-14  kcal/kg actual body weight)  Protein:  95 grams (2 grams/kg IBW)  Fluid:  > 1L/day  EDUCATION NEEDS:   No education needs identified at this time     Jarome Matin, MS, RD, LDN Inpatient Clinical Dietitian Pager # [phone] After hours/weekend pager # [phone]

## 2015-09-13 NOTE — Progress Notes (Signed)
PULMONARY / CRITICAL CARE MEDICINE   Name: Rebecca Richards MRN: [id number] DOB: Feb 19, 1946    ADMISSION DATE:  09/03/2015 CONSULTATION DATE:  09/07/2015  REFERRING MD:  Kathie Dike, M.D. / [facility]  CHIEF COMPLAINT:  Dyspnea  BRIEF:   70 y/o female with DM2 with ARDS and CAP initially admitted to [facility] on 6/14.   SUBJECTIVE: worsening tachypnea when vent weaned overnight, needed higher PEEP, given diuretics   VITAL SIGNS: BP 105/59 mmHg  Pulse 105  Temp(Src) 98.7 F (37.1 C) (Oral)  Resp 22  Ht 5\' 1"  (1.549 m)  Wt 78.9 kg (173 lb 15.1 oz)  BMI 32.88 kg/m2  SpO2 100%  HEMODYNAMICS:    VENTILATOR SETTINGS: Vent Mode:  [-] PRVC FiO2 (%):  [50 %-100 %] 60 % Set Rate:  [18 bmp-22 bmp] 22 bmp Vt Set:  [450 mL] 450 mL PEEP:  [10 cmH20-14 cmH20] 14 cmH20 Plateau Pressure:  [14 cmH20-24 cmH20] 24 cmH20  INTAKE / OUTPUT: I/O last 3 completed shifts: In: 2650.1 [I.V.:470.1; NG/GT:1730; IV Piggyback:450] Out: 2105 [Urine:2105]  PHYSICAL EXAMINATION: General: sedated on vent HENT: NCAT, ETT in place PULM: Loud crackles and rhonchi L>R bilaterally with vent supported breaths CV: Tachycardic, regular, no clear murmur, gallop rub GI: BS+, soft, nontender Derm: no ankle edema but some hand edema bilaterally Neuro: sedated on vent today  LABS:  BMET  Recent Labs Lab 09/11/15 0328 09/12/15 0430 09/13/15 0504  NA 143 146* 148*  K 3.4* 3.8 4.3  CL 109 114* 114*  CO2 23 27 27   BUN 35* 31* 26*  CREATININE 1.06* 0.98 0.83  GLUCOSE 118* 135* 123*    Electrolytes  Recent Labs Lab 09/11/15 0328 09/12/15 0430 09/13/15 0504  CALCIUM 8.2* 8.1* 8.4*  MG 2.6* 2.6* 2.4  PHOS 3.1 2.4* 2.4*    CBC  Recent Labs Lab 09/11/15 0328 09/12/15 0430 09/13/15 0504  WBC 18.5* 16.2* 17.4*  HGB 9.8* 8.0* 8.2*  HCT 30.9* 25.4* 25.6*  PLT 325 330 307    Coag's  Recent Labs Lab 09/12/2015 1038  APTT 34  INR 1.47    Sepsis Markers  Recent  Labs Lab 09/22/2015 1038 09/12/2015 1255 09/09/15 0308 09/09/15 1000 09/10/15 0316 09/11/15 0328  LATICACIDVEN 1.2 1.0  --  1.0  --   --   PROCALCITON 0.29  --  0.73  --  0.52 0.39    ABG  Recent Labs Lab 09/10/15 1405 09/11/15 0310 09/13/15 0540  PHART 7.384 7.438 7.413  PCO2ART 45.1* 37.6 42.2  PO2ART 59.9* 98.2 52.3*    Liver Enzymes  Recent Labs Lab 09/11/2015 0755 09/09/15 1000 09/12/15 0430 09/13/15 0504  AST 72* 55*  --   --   ALT 17 16  --   --   ALKPHOS 79 94  --   --   BILITOT 0.6 0.6  --   --   ALBUMIN 3.5 2.9* 2.5* 2.4*    Cardiac Enzymes  Recent Labs Lab 09/07/2015 0628  TROPONINI <0.03    Glucose  Recent Labs Lab 09/12/15 0747 09/12/15 1118 09/12/15 1616 09/12/15 2008 09/13/15 0017 09/13/15 0349  GLUCAP 114* 142* 112* 172* 174* 109*    Imaging  6/19 CXR images reviewed> persistent bilateral air space disease without significant pleural effusion on R, perhaps trace effusion on left, ETT in place, PICC R side in place  STUDIES:  TTE 6/15:  Moderate LVH with EF 70-75%. Normal regional wall motion. Pulmonary artery systolic pressure 46 mmHg. RV normal  in size. Port CXR 6/16: B/L opacities. Slightly improved. Images reviewed Port CXR 6/17:  Endotracheal tube 5.2 cm above carina. Enteric feeding tube coursing below diaphragm. Patchy bilateral alveolar opacities.  MICROBIOLOGY: MRSA PCR 6/14:  Positive Blood Ctx x2 6/14 >>  Urine Ctx 6/14:  Negative  Urine Strep Ag 6/14:  Negative Urine Legionella Ag 6/14: Negative Tracheal Asp Ctx 6/15:  Negative Tracheal Asp Ctx 6/18>> GPC  ANTIBIOTICS: Rocephin 6/14 >> Azithromycin 6/14 >>  SIGNIFICANT EVENTS: 6/14 - Admission to [facility], intubation, transfer to [facility]  LINES/TUBES: OETT 6/14>> RUE TL PICC 6/17>> FOLEY 6/14>> OGT 6/14>> PIV x2  ASSESSMENT / PLAN:  PULMONARY A: Acute Hypoxic Respiratory Failure  Severe CAP H/O OSA - On CPAP ARDS > worsening hypoxemia  6/19 likely in setting of worsening ARDS, ? pulm edema  P:   Continue full vent support at 8cc/kg IBW - intolerant of 6cc/kg IBW Agree with increased PEEP this morning Repeat ABG now to follow PaO2, goal PaO2 is 55-65 Wean FiO2 for Sat >94% Continue Duoneb q4hr   CARDIOVASCULAR A:  Sinus tach in setting of acute illness H/O HTN Borderline low hypotension  P:  Monitor on telemetry  Vitals per unit protocol Hold further IVF bolus Diurese today, OK to use vasopressors if needed  RENAL A:   Acute Renal Failure - Resolved Hypernatremia - Mild, worsening  P:   Monitor BMET and UOP Replace electrolytes as needed Start free water via tube for hypernatremia  GASTROINTESTINAL A:   No acute issues H/O Hepatitis C Constipation P:   Pepcid IV q12hr Continue tube feeds Senna via tube bid  HEMATOLOGIC/ONCOLOGIC A:   Anemia - Mild. No signs of active bleeding H/O Brain/Pituitary Tumor - Resected 2012 H/O Right Renal Cancer  P:  Trending cell counts daily w/ CBC SCDs Heparin [place] q8hr Monitor for bleeding  INFECTIOUS A:   Sepsis Severe CAP Fever 6/19, Concern for VAP/HCAP given worsening hypoxemia P:   Change to Vanc/Zosyn today and follow cultures sent 6/18 Trending PCT per algorithm > repeat 6/19 Send urine legionella antigen again  ENDOCRINE A:   H/O DM Type 2 - BG controlled H/O Pituitary Tumor - S/P Resection  P:   Accu-Checks q4hr SSI per Moderate Algorithm Levemir 10u qhs  NEUROLOGIC A:   Sedation on Ventilator H/O Brain/Pituitary Tumor - Resected 2012 H/O Anxiety/Depression H/O Chronic Pain - S/P Spinal Cord Stimulator  P:   RASS goal: -1 to -2 Fentanyl gtt & IV prn Versed gtt & IV prn Lyrica bid VT  FAMILY  - Updates: No family at bedside 6/18. Son Treanna Wolgemuth updated 6/18 via phone by Dr. Ashok Cordia.  McQuaid called and left a message on 6/19 AM.  - Inter-disciplinary family meet or Palliative Care meeting due by:  6/21   External total  of 37 minutes of critical care time today caring for the patient and reviewing the patient's electronic medical record.  Roselie Awkward, MD [place] [facility] Pager: [phone] Cell: [phone] After 3pm or if no response, call [phone]   09/13/2015, 9:07 AM

## 2015-09-13 NOTE — Progress Notes (Signed)
eLink Physician-Brief Progress Note Patient Name: Rebecca Richards DOB: 16-May-1945 MRN: [id number]   Date of Service  09/13/2015  HPI/Events of Note  Pt had SpO2 reading of 100%.  FiO2 decreased to 60%.  SpO2 remained in acceptable range, but pt developed tachypnea >> no improvement with additional sedation.  FiO2 increased and tachypnea resolved.  Tried changing vent w/o improvement.   eICU Interventions  Increase PEEP to 14, give lasix 40 mg IV x one, change sedation to diprivan/fentanyl with prn versed, f/u CXR and ABG.      Intervention Category Major Interventions: Other:  Jessey Huyett 09/13/2015, 6:15 AM

## 2015-09-14 ENCOUNTER — Inpatient Hospital Stay (HOSPITAL_COMMUNITY): Payer: Medicare HMO

## 2015-09-14 LAB — GLUCOSE, CAPILLARY
GLUCOSE-CAPILLARY: 126 mg/dL — AB (ref 65–99)
GLUCOSE-CAPILLARY: 158 mg/dL — AB (ref 65–99)
GLUCOSE-CAPILLARY: 141 mg/dL — AB (ref 65–99)
GLUCOSE-CAPILLARY: 127 mg/dL — AB (ref 65–99)
Glucose-Capillary: 106 mg/dL — ABNORMAL HIGH (ref 65–99)

## 2015-09-14 LAB — BASIC METABOLIC PANEL
ANION GAP: 10 (ref 5–15)
BUN: 30 mg/dL — ABNORMAL HIGH (ref 6–20)
CALCIUM: 8.5 mg/dL — AB (ref 8.9–10.3)
CO2: 29 mmol/L (ref 22–32)
CREATININE: 0.87 mg/dL (ref 0.44–1.00)
Chloride: 105 mmol/L (ref 101–111)
GLUCOSE: 142 mg/dL — AB (ref 65–99)
Potassium: 3.8 mmol/L (ref 3.5–5.1)
Sodium: 144 mmol/L (ref 135–145)

## 2015-09-14 LAB — CBC WITH DIFFERENTIAL/PLATELET
BASOS ABS: 0 10*3/uL (ref 0.0–0.1)
BASOS PCT: 0 %
EOS PCT: 4 %
Eosinophils Absolute: 0.6 10*3/uL (ref 0.0–0.7)
HCT: 23.9 % — ABNORMAL LOW (ref 36.0–46.0)
Hemoglobin: 7.8 g/dL — ABNORMAL LOW (ref 12.0–15.0)
Lymphocytes Relative: 6 %
Lymphs Abs: 1.1 10*3/uL (ref 0.7–4.0)
MCH: 28.4 pg (ref 26.0–34.0)
MCHC: 32.6 g/dL (ref 30.0–36.0)
MCV: 86.9 fL (ref 78.0–100.0)
MONO ABS: 0.8 10*3/uL (ref 0.1–1.0)
MONOS PCT: 5 %
Neutro Abs: 15.2 10*3/uL — ABNORMAL HIGH (ref 1.7–7.7)
Neutrophils Relative %: 85 %
PLATELETS: 264 10*3/uL (ref 150–400)
RBC: 2.75 MIL/uL — ABNORMAL LOW (ref 3.87–5.11)
RDW: 17.9 % — AB (ref 11.5–15.5)
WBC: 17.8 10*3/uL — ABNORMAL HIGH (ref 4.0–10.5)

## 2015-09-14 LAB — RENAL FUNCTION PANEL
ALBUMIN: 2.3 g/dL — AB (ref 3.5–5.0)
Anion gap: 9 (ref 5–15)
BUN: 31 mg/dL — AB (ref 6–20)
CO2: 30 mmol/L (ref 22–32)
CREATININE: 0.88 mg/dL (ref 0.44–1.00)
Calcium: 8.5 mg/dL — ABNORMAL LOW (ref 8.9–10.3)
Chloride: 105 mmol/L (ref 101–111)
GFR calc Af Amer: >60 mL/min (ref >60)
Glucose, Bld: 145 mg/dL — ABNORMAL HIGH (ref 65–99)
PHOSPHORUS: 3.2 mg/dL (ref 2.5–4.6)
Potassium: 3.8 mmol/L (ref 3.5–5.1)
SODIUM: 144 mmol/L (ref 135–145)

## 2015-09-14 LAB — PROCALCITONIN: Procalcitonin: 0.39 ng/mL

## 2015-09-14 LAB — CULTURE, RESPIRATORY: SPECIAL REQUESTS: NORMAL

## 2015-09-14 LAB — LEGIONELLA PNEUMOPHILA SEROGP 1 UR AG: L. PNEUMOPHILA SEROGP 1 UR AG: NEGATIVE

## 2015-09-14 LAB — CULTURE, RESPIRATORY W GRAM STAIN

## 2015-09-14 LAB — MAGNESIUM: MAGNESIUM: 2.1 mg/dL (ref 1.7–2.4)

## 2015-09-14 MED ORDER — FUROSEMIDE 10 MG/ML IJ SOLN
40.0000 mg | Freq: Four times a day (QID) | INTRAMUSCULAR | Status: AC
  Administered 2015-09-14 (×2): 40 mg via INTRAVENOUS
  Filled 2015-09-14 (×2): qty 4

## 2015-09-14 MED ORDER — POTASSIUM CHLORIDE 20 MEQ/15ML (10%) PO SOLN
40.0000 meq | Freq: Two times a day (BID) | ORAL | Status: AC
  Administered 2015-09-14 – 2015-09-15 (×3): 40 meq
  Filled 2015-09-14 (×2): qty 30

## 2015-09-14 NOTE — Progress Notes (Signed)
Patient foley bag had leakage, bag changed out.

## 2015-09-14 NOTE — Progress Notes (Signed)
PULMONARY / CRITICAL CARE MEDICINE   Name: Rebecca Richards MRN: [id number] DOB: 04/12/1945    ADMISSION DATE:  08/26/2015 CONSULTATION DATE:  09/04/2015  REFERRING MD:  Kathie Dike, M.D. / AP [facility]  CHIEF COMPLAINT:  Dyspnea  BRIEF:   70 y/o female with DM2 with ARDS and CAP initially admitted to [facility] on 6/14.   SUBJECTIVE: Oxygenation better, diuresed, WBC and fever still up, lost OG tube overnight   VITAL SIGNS: BP 123/72 mmHg  Pulse 93  Temp(Src) 98.8 F (37.1 C) (Oral)  Resp 16  Ht 5\' 1"  (1.549 m)  Wt 163 lb 9.3 oz (74.2 kg)  BMI 30.92 kg/m2  SpO2 100%  HEMODYNAMICS:    VENTILATOR SETTINGS: Vent Mode:  [-] PRVC FiO2 (%):  [50 %-60 %] 50 % Set Rate:  [22 bmp] 22 bmp Vt Set:  [450 mL-460 mL] 460 mL PEEP:  [12 cmH20-14 cmH20] 12 cmH20 Plateau Pressure:  [26 cmH20] 26 cmH20  INTAKE / OUTPUT: I/O last 3 completed shifts: In: 4346.9 [I.V.:476.9; Other:195; NG/GT:2375; IV Piggyback:1300] Out: 5060 [Urine:5060]  PHYSICAL EXAMINATION: General: sedated on vent HENT: NCAT, ETT in place PULM: Rhonchi bilaterally with vent supported breaths CV: RRR, no clear murmur, gallop rub GI: BS+, soft, nontender Derm: some hand edema bilaterally > improved today Neuro: sedated on vent, stirs to touch  LABS:  BMET  Recent Labs Lab 09/12/15 0430 09/13/15 0504 09/14/15 0500  NA 146* 148* 144  144  K 3.8 4.3 3.8  3.8  CL 114* 114* 105  105  CO2 27 27 30  29   BUN 31* 26* 31*  30*  CREATININE 0.98 0.83 0.88  0.87  GLUCOSE 135* 123* 145*  142*    Electrolytes  Recent Labs Lab 09/12/15 0430 09/13/15 0504 09/14/15 0500  CALCIUM 8.1* 8.4* 8.5*  8.5*  MG 2.6* 2.4 2.1  PHOS 2.4* 2.4* 3.2    CBC  Recent Labs Lab 09/12/15 0430 09/13/15 0504 09/14/15 0500  WBC 16.2* 17.4* 17.8*  HGB 8.0* 8.2* 7.8*  HCT 25.4* 25.6* 23.9*  PLT 330 307 264    Coag's  Recent Labs Lab 09/23/2015 1038  APTT 34  INR 1.47    Sepsis Markers  Recent  Labs Lab 09/23/2015 1038 09/02/2015 1255  09/09/15 1000  09/11/15 0328 09/13/15 0504 09/14/15 0500  LATICACIDVEN 1.2 1.0  --  1.0  --   --   --   --   PROCALCITON 0.29  --   < >  --   < > 0.39 0.39 0.39  < > = values in this interval not displayed.  ABG  Recent Labs Lab 09/11/15 0310 09/13/15 0540 09/13/15 0947  PHART 7.438 7.413 7.376  PCO2ART 37.6 42.2 47.0*  PO2ART 98.2 52.3* 57.8*    Liver Enzymes  Recent Labs Lab 09/17/2015 0755 09/09/15 1000 09/12/15 0430 09/13/15 0504 09/14/15 0500  AST 72* 55*  --   --   --   ALT 17 16  --   --   --   ALKPHOS 79 94  --   --   --   BILITOT 0.6 0.6  --   --   --   ALBUMIN 3.5 2.9* 2.5* 2.4* 2.3*    Cardiac Enzymes  Recent Labs Lab 09/24/2015 0628  TROPONINI <0.03    Glucose  Recent Labs Lab 09/13/15 1211 09/13/15 1547 09/13/15 1910 09/13/15 2322 09/14/15 0328 09/14/15 0736  GLUCAP 149* 170* 168* 187* 106* 126*    Imaging  6/19  CXR images reviewed> persistent bilateral air space disease without significant pleural effusion on R, perhaps trace effusion on left, ETT in place, PICC R side in place  STUDIES:  TTE 6/15:  Moderate LVH with EF 70-75%. Normal regional wall motion. Pulmonary artery systolic pressure 46 mmHg. RV normal in size. Port CXR 6/16: B/L opacities. Slightly improved. Images reviewed Port CXR 6/17:  Endotracheal tube 5.2 cm above carina. Enteric feeding tube coursing below diaphragm. Patchy bilateral alveolar opacities.  MICROBIOLOGY: MRSA PCR 6/14:  Positive Blood Ctx x2 6/14 >>  Urine Ctx 6/14:  Negative  Urine Strep Ag 6/14:  Negative Urine Legionella Ag 6/14: Negative Tracheal Asp Ctx 6/15:  Negative Tracheal Asp Ctx 6/18>> Staph aureus  ANTIBIOTICS: Rocephin 6/14 >> 6/19 Azithromycin 6/14 >> 6/19  Cefepime 6/19 >  Vanc 6/19 >   SIGNIFICANT EVENTS: 6/14 - Admission to [facility], intubation, transfer to [facility]  LINES/TUBES: OETT 6/14>> RUE TL PICC 6/17>> FOLEY  6/14>> OGT 6/14>> PIV x2  ASSESSMENT / PLAN:  PULMONARY A: ARDS > worsened over weeked 6/17, stable 6/20 Acute Hypoxic Respiratory Failure  Severe CAP > HCAP now H/O OSA - On CPAP  P:   Continue full vent support at 8cc/kg IBW - intolerant of 6cc/kg IBW First wean FiO2 to 40%, then PEEP, aiming to keep O2 saturation > 92% Continue Duoneb q4hr  VAP prevention Goal CVP <4 Diurese  CARDIOVASCULAR A:  Sinus tach in setting of acute illness > improving H/O HTN  P:  Monitor on telemetry  Vitals per unit protocol  RENAL A:   Acute Renal Failure - Resolved Hypernatremia - continue free water  P:   Monitor BMET and UOP Replace electrolytes as needed Continue free water Give KCL with lasix  GASTROINTESTINAL A:   No acute issues H/O Hepatitis C Constipation P:   Pepcid IV q12hr Continue tube feeds Senna via tube bid  HEMATOLOGIC/ONCOLOGIC A:   Anemia - Mild. No signs of active bleeding H/O Brain/Pituitary Tumor - Resected 2012 H/O Right Renal Cancer  P:  Trending cell counts daily w/ CBC SCDs Heparin [place] q8hr Monitor for bleeding  INFECTIOUS A:   Sepsis Severe CAP HCAP > staph aureus on resp culture from 6/18 P:   Continue Cefepime/Vanc for now, await staph sensitivity  ENDOCRINE A:   H/O DM Type 2 - BG controlled H/O Pituitary Tumor - S/P Resection  P:   Accu-Checks q4hr SSI per Moderate Algorithm Levemir 10u qhs  NEUROLOGIC A:   Sedation on Ventilator H/O Brain/Pituitary Tumor - Resected 2012 H/O Anxiety/Depression H/O Chronic Pain - S/P Spinal Cord Stimulator  P:   RASS goal: -2 Fentanyl gtt & IV prn Propofol gtt & IV versed prn Lyrica bid VT  FAMILY  - Updates: No family at bedside 6/18. Son Rebecca Richards updated 6/18 via phone by Dr. Ashok Cordia.  McQuaid called and left a message on 6/19 AM.  - Inter-disciplinary family meet or Palliative Care meeting due by:  6/21   External total of 36 minutes of critical care time today  caring for the patient and reviewing the patient's electronic medical record.  Roselie Awkward, MD [place] [facility] Pager: [phone] Cell: [phone] After 3pm or if no response, call [phone]   09/14/2015, 9:03 AM

## 2015-09-15 ENCOUNTER — Inpatient Hospital Stay (HOSPITAL_COMMUNITY): Payer: Medicare HMO

## 2015-09-15 DIAGNOSIS — J15212 Pneumonia due to Methicillin resistant Staphylococcus aureus: Secondary | ICD-10-CM

## 2015-09-15 LAB — RENAL FUNCTION PANEL
ALBUMIN: 2.3 g/dL — AB (ref 3.5–5.0)
ANION GAP: 10 (ref 5–15)
BUN: 32 mg/dL — ABNORMAL HIGH (ref 6–20)
CALCIUM: 8.4 mg/dL — AB (ref 8.9–10.3)
CO2: 30 mmol/L (ref 22–32)
Chloride: 102 mmol/L (ref 101–111)
Creatinine, Ser: 0.83 mg/dL (ref 0.44–1.00)
GFR calc non Af Amer: >60 mL/min (ref >60)
Glucose, Bld: 111 mg/dL — ABNORMAL HIGH (ref 65–99)
PHOSPHORUS: 4 mg/dL (ref 2.5–4.6)
POTASSIUM: 3.9 mmol/L (ref 3.5–5.1)
SODIUM: 142 mmol/L (ref 135–145)

## 2015-09-15 LAB — GLUCOSE, CAPILLARY
GLUCOSE-CAPILLARY: 157 mg/dL — AB (ref 65–99)
GLUCOSE-CAPILLARY: 125 mg/dL — AB (ref 65–99)
GLUCOSE-CAPILLARY: 156 mg/dL — AB (ref 65–99)
GLUCOSE-CAPILLARY: 117 mg/dL — AB (ref 65–99)
Glucose-Capillary: 96 mg/dL (ref 65–99)
Glucose-Capillary: 103 mg/dL — ABNORMAL HIGH (ref 65–99)
Glucose-Capillary: 112 mg/dL — ABNORMAL HIGH (ref 65–99)

## 2015-09-15 LAB — CBC WITH DIFFERENTIAL/PLATELET
Basophils Absolute: 0 10*3/uL (ref 0.0–0.1)
Basophils Relative: 0 %
EOS ABS: 1 10*3/uL — AB (ref 0.0–0.7)
EOS PCT: 6 %
HCT: 24.5 % — ABNORMAL LOW (ref 36.0–46.0)
Hemoglobin: 8 g/dL — ABNORMAL LOW (ref 12.0–15.0)
LYMPHS ABS: 1.3 10*3/uL (ref 0.7–4.0)
Lymphocytes Relative: 8 %
MCH: 28.1 pg (ref 26.0–34.0)
MCHC: 32.7 g/dL (ref 30.0–36.0)
MCV: 86 fL (ref 78.0–100.0)
MONOS PCT: 4 %
Monocytes Absolute: 0.7 10*3/uL (ref 0.1–1.0)
NEUTROS ABS: 13.5 10*3/uL — AB (ref 1.7–7.7)
NEUTROS PCT: 82 %
PLATELETS: 278 10*3/uL (ref 150–400)
RBC: 2.85 MIL/uL — ABNORMAL LOW (ref 3.87–5.11)
RDW: 17.5 % — ABNORMAL HIGH (ref 11.5–15.5)
WBC: 16.6 10*3/uL — AB (ref 4.0–10.5)

## 2015-09-15 LAB — VANCOMYCIN, TROUGH: Vancomycin Tr: 31 ug/mL (ref 10.0–20.0)

## 2015-09-15 LAB — MAGNESIUM: Magnesium: 2.3 mg/dL (ref 1.7–2.4)

## 2015-09-15 MED ORDER — FUROSEMIDE 10 MG/ML IJ SOLN
40.0000 mg | Freq: Four times a day (QID) | INTRAMUSCULAR | Status: AC
  Administered 2015-09-15 (×2): 40 mg via INTRAVENOUS
  Filled 2015-09-15 (×2): qty 4

## 2015-09-15 MED ORDER — VITAL HIGH PROTEIN PO LIQD
1000.0000 mL | ORAL | Status: DC
  Administered 2015-09-16: 1000 mL
  Administered 2015-09-17 (×3)
  Administered 2015-09-17: 1000 mL
  Administered 2015-09-17 (×8)
  Administered 2015-09-18: 1000 mL
  Administered 2015-09-19 (×2)
  Filled 2015-09-15 (×4): qty 1000

## 2015-09-15 MED ORDER — PRO-STAT SUGAR FREE PO LIQD
60.0000 mL | Freq: Two times a day (BID) | ORAL | Status: DC
  Administered 2015-09-15 – 2015-09-19 (×9): 60 mL
  Filled 2015-09-15 (×10): qty 60

## 2015-09-15 MED ORDER — SENNOSIDES 8.8 MG/5ML PO SYRP
5.0000 mL | ORAL_SOLUTION | Freq: Every evening | ORAL | Status: DC | PRN
  Administered 2015-09-15: 5 mL
  Filled 2015-09-15 (×2): qty 5

## 2015-09-15 MED ORDER — POTASSIUM CHLORIDE 20 MEQ/15ML (10%) PO SOLN
40.0000 meq | Freq: Two times a day (BID) | ORAL | Status: AC
  Administered 2015-09-15 – 2015-09-16 (×2): 40 meq
  Filled 2015-09-15 (×2): qty 30

## 2015-09-15 NOTE — Progress Notes (Signed)
Called JC in pharmacy to report [place] of 31. Dose d/cd for 2300 tonight per protocol.

## 2015-09-15 NOTE — Progress Notes (Signed)
Nutrition Follow-up  DOCUMENTATION CODES:   Obesity unspecified  INTERVENTION:  - Will change TF regimen: Vital High Protein @ 20 mL/hr with 60 mL Prostat BID which (including kcal from Propofol) will provide 1255 kcal, 102 grams of protein, and 401 mL free water.  - Continue 200 mL free water every 6 hours. - RD will follow-up 6/22.  NUTRITION DIAGNOSIS:   Inadequate oral intake related to inability to eat as evidenced by NPO status. -ongoing  GOAL:   Provide needs based on ASPEN/SCCM guidelines -met at this time.  MONITOR:   Vent status, TF tolerance, Weight trends, Labs, Skin, I & O's  ASSESSMENT:   70 y.o. female with medical history significant of HTN, tobacco abuse, DM type 2, OSA on CPAP, presented to the ED via ambulance with complaints of shortness of breath for the last 3 days which significantly worsened last night. She reports an associated cough with production of white sputum as well as chest soreness with deep inhalation, wheeze, and slight fever. She denies any pain while coughing.  6/21 No family/visitors present. OGT remains in place and pt receiving Vital High Protein @ 45 mL/hr with 200 mL free water every 6 hours which is providing 1080 kcal, 95 grams of protein, and 1703 mL free water.   Patient is currently intubated on ventilator support MV: 13.7 L/min Temp (24hrs), Avg:99.8 F (37.7 C), Min:98.9 F (37.2 C), Max:100.5 F (38.1 C) Propofol: 14.2 ml/hr (375 kcal)  Will change TF regimen as outlined above to account for Propofol kcal. Noted that kcal will exceed maximum estimated needs by ~200 kcal; will adjust as able tomorrow to avoid prolonged overfeeding of pt.   Current weight has trended back down to admission weight. Medications reviewed; 40 mg IV Lasix every 6 hours, 40 mEq KCl BID x2 doses. Labs reviewed; CBGs: 96 and 125 mg/dL today, BUN: 32 mg/dL, Ca: 8.4 mg/dL.   6/19 - Propofol running over the weekend and is currently being held, per RN,  for wakeup assessment.  - RN reports pt tolerating TF regimen well; Vital High Protein @ 45 mL/hr with MD order for 200 mL free water every 6 hours.  - OGT in place.  - RN reports pt continues without BM but that order in place for Senokot BID via tube. - Nutrition needs based on admission weight as weight has been trending up. - Patient is currently intubated on ventilator support; MV: 10 L/min; Temp (24hrs), Max:101.8 F (38.8 C); Propofol: currently being held for wake up assessment.   6/16 - Pt with OGT in place currently receiving Vital High Protein @ 45 mL/hr (goal rate) with 30 mL free water every 4 hours.  - This regimen is providing 1080 kcal, 94.5 grams of protein, and 1023 mL free water - Patient is currently intubated on ventilator support; MV: 8.3 L/min; Temp (24hrs), Max:102.6 F (39.2 C); Propofol: none - Drips: Versed @ 6 mg/hr, Fentanyl @ 75 mcg/hr.    *SEE CHART FOR RD NOTES EARLIER IN ADMISSION.   Diet Order:  Diet NPO time specified  Skin:  Reviewed, no issues  Last BM:  6/19  Height:   Ht Readings from Last 1 Encounters:  09/20/2015 _0  (1.549 m)    Weight:   Wt Readings from Last 1 Encounters:  09/15/15 163 lb 9.3 oz (74.2 kg)    Ideal Body Weight:  47.73 kg (kg)  BMI:  Body mass index is 30.92 kg/(m^2).  Estimated Nutritional Needs:   Kcal:  396-7289 (11-14 kcal/kg actual body weight)  Protein:  95 grams (2 grams/kg IBW)  Fluid:  > 1L/day  EDUCATION NEEDS:   No education needs identified at this time     Jarome Matin, MS, RD, LDN Inpatient Clinical Dietitian Pager # [phone] After hours/weekend pager # [phone]

## 2015-09-15 NOTE — Progress Notes (Signed)
PULMONARY / CRITICAL CARE MEDICINE   Name: Rebecca Richards MRN: [id number] DOB: 07-16-1945    ADMISSION DATE:  09/13/2015 CONSULTATION DATE:  09/22/2015  REFERRING MD:  Kathie Dike, M.D. / AP TR[facility]  CHIEF COMPLAINT:  Dyspnea  BRIEF:   70 y/o female with DM2 with ARDS and CAP initially admitted to [facility] on 6/14.  Developed MRSA pneumonia 6/18.   SUBJECTIVE:  Oxygenation improving Fever curve improving WBC improving Agitated on Wake up assessment this morning   VITAL SIGNS: BP 119/64 mmHg  Pulse 116  Temp(Src) 98.9 F (37.2 C) (Axillary)  Resp 43  Ht 5\' 1"  (1.549 m)  Wt 163 lb 9.3 oz (74.2 kg)  BMI 30.92 kg/m2  SpO2 100%  HEMODYNAMICS:    VENTILATOR SETTINGS: Vent Mode:  [-] CPAP;PSV FiO2 (%):  [40 %-50 %] 40 % Set Rate:  [22 bmp] 22 bmp Vt Set:  [450 mL-460 mL] 450 mL PEEP:  [5 cmH20-10 cmH20] 5 cmH20 Pressure Support:  [10 cmH20] 10 cmH20 Plateau Pressure:  [21 cmH20-25 cmH20] 24 cmH20  INTAKE / OUTPUT: I/O last 3 completed shifts: In: 4397.5 [I.V.:637.5; Other:140; NG/GT:2620; IV Piggyback:1000] Out: 4350 [Urine:4350]  PHYSICAL EXAMINATION: General: agitated on vent HENT: NCAT, ETT in place PULM: CTA B today, vent supported breaths CV: RRR, no clear murmur, gallop rub GI: BS+, soft, nontender Derm: improved upper extremity edema, but still present Neuro: sedated on vent, stirs to touch  LABS:  BMET  Recent Labs Lab 09/13/15 0504 09/14/15 0500 09/15/15 0500  NA 148* 144  144 142  K 4.3 3.8  3.8 3.9  CL 114* 105  105 102  CO2 27 30  29 30   BUN 26* 31*  30* 32*  CREATININE 0.83 0.88  0.87 0.83  GLUCOSE 123* 145*  142* 111*    Electrolytes  Recent Labs Lab 09/13/15 0504 09/14/15 0500 09/15/15 0500  CALCIUM 8.4* 8.5*  8.5* 8.4*  MG 2.4 2.1 2.3  PHOS 2.4* 3.2 4.0    CBC  Recent Labs Lab 09/13/15 0504 09/14/15 0500 09/15/15 0500  WBC 17.4* 17.8* 16.6*  HGB 8.2* 7.8* 8.0*  HCT 25.6* 23.9* 24.5*  PLT 307  264 278    Coag's  Recent Labs Lab 09/01/2015 1038  APTT 34  INR 1.47    Sepsis Markers  Recent Labs Lab 09/11/2015 1038 09/14/2015 1255  09/09/15 1000  09/11/15 0328 09/13/15 0504 09/14/15 0500  LATICACIDVEN 1.2 1.0  --  1.0  --   --   --   --   PROCALCITON 0.29  --   < >  --   < > 0.39 0.39 0.39  < > = values in this interval not displayed.  ABG  Recent Labs Lab 09/11/15 0310 09/13/15 0540 09/13/15 0947  PHART 7.438 7.413 7.376  PCO2ART 37.6 42.2 47.0*  PO2ART 98.2 52.3* 57.8*    Liver Enzymes  Recent Labs Lab 09/09/15 1000  09/13/15 0504 09/14/15 0500 09/15/15 0500  AST 55*  --   --   --   --   ALT 16  --   --   --   --   ALKPHOS 94  --   --   --   --   BILITOT 0.6  --   --   --   --   ALBUMIN 2.9*  < > 2.4* 2.3* 2.3*  < > = values in this interval not displayed.  Cardiac Enzymes No results for input(s): TROPONINI, PROBNP in the last 168  hours.  Glucose  Recent Labs Lab 09/14/15 1110 09/14/15 1559 09/14/15 1938 09/14/15 2318 09/15/15 0322 09/15/15 0743  GLUCAP 158* 141* 127* 157* 96 125*    Imaging  6/19 CXR images reviewed> persistent bilateral air space disease without significant pleural effusion on R, perhaps trace effusion on left, ETT in place, PICC R side in place  STUDIES:  TTE 6/15:  Moderate LVH with EF 70-75%. Normal regional wall motion. Pulmonary artery systolic pressure 46 mmHg. RV normal in size. Port CXR 6/16: B/L opacities. Slightly improved. Images reviewed Port CXR 6/17:  Endotracheal tube 5.2 cm above carina. Enteric feeding tube coursing below diaphragm. Patchy bilateral alveolar opacities.  MICROBIOLOGY: MRSA PCR 6/14:  Positive Blood Ctx x2 6/14 >>  Urine Ctx 6/14:  Negative  Urine Strep Ag 6/14:  Negative Urine Legionella Ag 6/14: Negative Tracheal Asp Ctx 6/15:  Negative Tracheal Asp Ctx 6/18>> MRSA  ANTIBIOTICS: Rocephin 6/14 >> 6/19 Azithromycin 6/14 >> 6/19  Cefepime 6/19 > 6/21 Vanc 6/19 >    SIGNIFICANT EVENTS: 6/14 - Admission to [facility], intubation, transfer to [facility]  LINES/TUBES: OETT 6/14>> RUE TL PICC 6/17>> FOLEY 6/14>> OGT 6/14>> PIV x2  ASSESSMENT / PLAN:  PULMONARY A: ARDS > worsened over weeked 6/17, stable 6/20 Acute Hypoxic Respiratory Failure  Severe CAP > HCAP now H/O OSA - On CPAP P:   Wean to pressure support today as tolerated Continue full vent support First wean FiO2 to 40%, then PEEP, aiming to keep O2 saturation > 92% Continue Duoneb q4hr  VAP prevention Goal CVP <4 Diurese again today  CARDIOVASCULAR A:  Sinus tach in setting of acute illness > improving H/O HTN P:  Monitor on telemetry  Vitals per unit protocol  RENAL A:   Hypernatremia - improved P:   Monitor BMET and UOP Replace electrolytes as needed Continue free water Give KCL with lasix  GASTROINTESTINAL A:   No acute issues H/O Hepatitis C Constipation  P:   Pepcid IV q12hr Continue tube feeds Senna > change to prn  HEMATOLOGIC/ONCOLOGIC A:   Anemia - Mild. No signs of active bleeding H/O Brain/Pituitary Tumor - Resected 2012 H/O Right Renal Cancer  P:  SCDs Heparin [place] q8hr Monitor for bleeding  INFECTIOUS A:   Sepsis Severe CAP on admission HCAP > MRSA pneumonia P:   Stop cefepime Treat MRSA pneumonia with Vanc x5 days > stop date 6/22  ENDOCRINE A:   H/O DM Type 2 - BG controlled H/O Pituitary Tumor - S/P Resection  P:   Accu-Checks q4hr SSI per Moderate Algorithm Levemir 10u qhs  NEUROLOGIC A:   Sedation on Ventilator H/O Brain/Pituitary Tumor - Resected 2012 H/O Anxiety/Depression H/O Chronic Pain - S/P Spinal Cord Stimulator  P:   RASS goal: -2 Fentanyl gtt & IV prn Propofol gtt & IV versed prn Lyrica bid VT  FAMILY  - Updates: No family at bedside 6/21. Son Adriann Mcbryar updated 6/21 via phone by Dr. [place] Bells.    External total of 34 minutes of critical care time today caring for the patient and  reviewing the patient's electronic medical record.  Roselie Awkward, MD [place] [facility]M Pager: [phone] Cell: [phone] After 3pm or if no response, call [phone]   09/15/2015, 8:52 AM

## 2015-09-15 NOTE — Progress Notes (Addendum)
Pharmacy Antibiotic Follow-up Note  Rebecca Richards is a 69 y.o. year-old female admitted on 09/13/2015.  Patient now on vancomycin only due to MRSA PNA  Assessment/Plan: Vancomycin 1500mg  x1, then 1gm IV every 12 hours.  Goal trough 15-20 mcg/mL.  Check trough tonight prior to 11pm dose which will prior to 6th total vanc dose  Temp (24hrs), Avg:99.8 F (37.7 C), Min:98.9 F (37.2 C), Max:100.5 F (38.1 C)   Recent Labs Lab 09/11/15 0328 09/12/15 0430 09/13/15 0504 09/14/15 0500 09/15/15 0500  WBC 18.5* 16.2* 17.4* 17.8* 16.6*     Recent Labs Lab 09/11/15 0328 09/12/15 0430 09/13/15 0504 09/14/15 0500 09/15/15 0500  CREATININE 1.06* 0.98 0.83 0.88  0.87 0.83   Estimated Creatinine Clearance: 60.6 mL/min (by C-G formula based on Cr of 0.83).    Allergies  Allergen Reactions  . Lactose Intolerance (Gi)    Antimicrobials this admission: 6/19 Vanc >>  6/19 Cefepime >> 6/21  Levels/dose changes this admission:  Microbiology results: 6/14 BCx: ngtd 6/14 UCx: NGF  6/14 strep pneumo/leg ur ag: neg/IP 6/15 trach asp: ng-final 6/14 MRSA PCR: + 6/14 HIV ab: NR 6/18 Trach asp: MRSA   Thank you for allowing pharmacy to be a part of this patient's care.   Adrian Saran, PharmD, BCPS Pager [phone] 09/15/2015 11:37 AM

## 2015-09-16 ENCOUNTER — Inpatient Hospital Stay (HOSPITAL_COMMUNITY): Payer: Medicare HMO

## 2015-09-16 LAB — BASIC METABOLIC PANEL
ANION GAP: 10 (ref 5–15)
BUN: 45 mg/dL — ABNORMAL HIGH (ref 6–20)
CO2: 32 mmol/L (ref 22–32)
Calcium: 8.7 mg/dL — ABNORMAL LOW (ref 8.9–10.3)
Chloride: 102 mmol/L (ref 101–111)
Creatinine, Ser: 0.93 mg/dL (ref 0.44–1.00)
GFR calc Af Amer: >60 mL/min (ref >60)
Glucose, Bld: 103 mg/dL — ABNORMAL HIGH (ref 65–99)
POTASSIUM: 4.2 mmol/L (ref 3.5–5.1)
SODIUM: 144 mmol/L (ref 135–145)

## 2015-09-16 LAB — RENAL FUNCTION PANEL
Albumin: 2.3 g/dL — ABNORMAL LOW (ref 3.5–5.0)
Anion gap: 10 (ref 5–15)
BUN: 44 mg/dL — ABNORMAL HIGH (ref 6–20)
CO2: 32 mmol/L (ref 22–32)
Calcium: 8.7 mg/dL — ABNORMAL LOW (ref 8.9–10.3)
Chloride: 103 mmol/L (ref 101–111)
Creatinine, Ser: 0.9 mg/dL (ref 0.44–1.00)
GFR calc Af Amer: >60 mL/min (ref >60)
GFR calc non Af Amer: >60 mL/min (ref >60)
Glucose, Bld: 102 mg/dL — ABNORMAL HIGH (ref 65–99)
Phosphorus: 4.3 mg/dL (ref 2.5–4.6)
Potassium: 4.3 mmol/L (ref 3.5–5.1)
Sodium: 145 mmol/L (ref 135–145)

## 2015-09-16 LAB — GLUCOSE, CAPILLARY
GLUCOSE-CAPILLARY: 97 mg/dL (ref 65–99)
GLUCOSE-CAPILLARY: 89 mg/dL (ref 65–99)
Glucose-Capillary: 138 mg/dL — ABNORMAL HIGH (ref 65–99)
Glucose-Capillary: 131 mg/dL — ABNORMAL HIGH (ref 65–99)
Glucose-Capillary: 128 mg/dL — ABNORMAL HIGH (ref 65–99)

## 2015-09-16 LAB — TRIGLYCERIDES: Triglycerides: 241 mg/dL — ABNORMAL HIGH (ref <150)

## 2015-09-16 LAB — MAGNESIUM: Magnesium: 2.4 mg/dL (ref 1.7–2.4)

## 2015-09-16 LAB — VANCOMYCIN, TROUGH: VANCOMYCIN TR: 19 ug/mL (ref 10.0–20.0)

## 2015-09-16 MED ORDER — VANCOMYCIN HCL IN DEXTROSE 1-5 GM/200ML-% IV SOLN
1000.0000 mg | INTRAVENOUS | Status: DC
  Administered 2015-09-16 – 2015-09-18 (×3): 1000 mg via INTRAVENOUS
  Filled 2015-09-16 (×3): qty 200

## 2015-09-16 MED ORDER — FUROSEMIDE 10 MG/ML IJ SOLN
40.0000 mg | Freq: Four times a day (QID) | INTRAMUSCULAR | Status: AC
  Administered 2015-09-16 (×2): 40 mg via INTRAVENOUS
  Filled 2015-09-16 (×2): qty 4

## 2015-09-16 MED ORDER — POTASSIUM CHLORIDE 20 MEQ/15ML (10%) PO SOLN
40.0000 meq | Freq: Two times a day (BID) | ORAL | Status: DC
  Administered 2015-09-16: 40 meq
  Filled 2015-09-16: qty 30

## 2015-09-16 NOTE — Progress Notes (Signed)
Pharmacy Antibiotic Follow-up Note  Rebecca Richards is a 70 y.o. year-old female admitted on 09/21/2015.  Patient now on vancomycin only due to MRSA PNA.   Assessment/Plan: 1) Vanc level now 19 after vanc d/c'd due to trough of 31 last PM. Last dose given was 1g yesterday at 1029. This corresponds to a q24 regimen rather than the q12hr regimen they were on previously - start vanc 1g q24 2) Recheck vanc trough prn  Temp (24hrs), Avg:100 F (37.8 C), Min:98.5 F (36.9 C), Max:101.5 F (38.6 C)   Recent Labs Lab 09/11/15 0328 09/12/15 0430 09/13/15 0504 09/14/15 0500 09/15/15 0500  WBC 18.5* 16.2* 17.4* 17.8* 16.6*     Recent Labs Lab 09/12/15 0430 09/13/15 0504 09/14/15 0500 09/15/15 0500 09/16/15 0355  CREATININE 0.98 0.83 0.88  0.87 0.83 0.90  0.93   Estimated Creatinine Clearance: 56.1 mL/min (by C-G formula based on Cr of 0.9).    Allergies  Allergen Reactions  . Lactose Intolerance (Gi)    Antimicrobials this admission: 6/19 Vanc >>  6/19 Cefepime >> 6/21  Levels/dose changes this admission:  Microbiology results: 6/14 BCx: ngtd 6/14 UCx: NGF  6/14 strep pneumo/leg ur ag: neg/IP 6/15 trach asp: ng-final 6/14 MRSA PCR: + 6/14 HIV ab: NR 6/18 Trach asp: MRSA   Thank you for allowing pharmacy to be a part of this patient's care.   Adrian Saran, PharmD, BCPS Pager [phone] 09/16/2015 10:53 AM

## 2015-09-16 NOTE — Progress Notes (Signed)
Pt opened eyes partially to command and followed commands with all extremities.  She also nodded her head yes when asked if she heard me.

## 2015-09-16 NOTE — Progress Notes (Signed)
Per Md order- 2 RTs present- advanced ett 3 cm- was at 20cm lip- now resides at 23cm lip. Resulted in BBS, Sp02 97. RN aware.

## 2015-09-16 NOTE — Progress Notes (Signed)
Pharmacy Antibiotic Note  Ravon A Escovar is a 70 y.o. female admitted on 09/01/2015 with MRSA PNA.  Pharmacy has been consulted for Vancomycin dosing.  09/15/15 2200 VT level 31, prior doses charted correctly.  Vancomycin order d/c due to high level.  Repeat level ordered for 6/22 AM  Plan: Recheck vancomycin level at 1000 6/22   Goal trough 15-20 mcg/mL.  Height: 5\' 1"  (154.9 cm) Weight: 165 lb 2 oz (74.9 kg) IBW/kg (Calculated) : 47.8  Temp (24hrs), Avg:99.5 F (37.5 C), Min:98.5 F (36.9 C), Max:101.1 F (38.4 C)   Recent Labs Lab 09/09/15 1000  09/11/15 0328 09/12/15 0430 09/13/15 0504 09/14/15 0500 09/15/15 0500 09/15/15 2200 09/16/15 0355  WBC  --   < > 18.5* 16.2* 17.4* 17.8* 16.6*  --   --   CREATININE 1.13*  < > 1.06* 0.98 0.83 0.88  0.87 0.83  --  0.90  0.93  LATICACIDVEN 1.0  --   --   --   --   --   --   --   --   VANCOTROUGH  --   --   --   --   --   --   --  31*  --   < > = values in this interval not displayed.  Estimated Creatinine Clearance: 56.1 mL/min (by C-G formula based on Cr of 0.9).    Allergies  Allergen Reactions  . Lactose Intolerance (Gi)     Antimicrobials this admission: 6/19 Vanc >>  6/19 Cefepime >> 6/21  Dose adjustments this admission: 6/21 vancomycin d/c due to 6/21 2200 VT of 31  Microbiology results: 6/14 BCx: ngtd 6/14 UCx: NGF  6/14 strep pneumo/leg ur ag: neg/IP 6/15 trach asp: ng-final 6/14 MRSA PCR: + 6/14 HIV ab: NR 6/18 Trach asp: MRSA  Thank you for allowing pharmacy to be a part of this patient's care.  Nani Skillern Crowford 09/16/2015 6:01 AM

## 2015-09-16 NOTE — Progress Notes (Signed)
PULMONARY / CRITICAL CARE MEDICINE   Name: Rebecca Richards MRN: [id number] DOB: 01/01/1946    ADMISSION DATE:  08/31/2015 CONSULTATION DATE:  09/05/2015  REFERRING MD:  Kathie Dike, M.D. / AP TRH  CHIEF COMPLAINT:  Dyspnea  BRIEF:   70 y/o female with DM2 with ARDS and CAP initially admitted to [facility] on 6/14.  Developed MRSA pneumonia 6/18.   SUBJECTIVE:  Oxygenation about the same Diuresing Febrile Working hard on SBT this morning   VITAL SIGNS: BP 131/58 mmHg  Pulse 117  Temp(Src) 101.5 F (38.6 C) (Oral)  Resp 32  Ht 5\' 1"  (1.549 m)  Wt 165 lb 2 oz (74.9 kg)  BMI 31.22 kg/m2  SpO2 93%  HEMODYNAMICS:    VENTILATOR SETTINGS: Vent Mode:  [-] PRVC FiO2 (%):  [40 %] 40 % Set Rate:  [14 bmp] 14 bmp Vt Set:  [450 mL] 450 mL PEEP:  [5 cmH20] 5 cmH20 Pressure Support:  [10 cmH20] 10 cmH20 Plateau Pressure:  [20 cmH20-23 cmH20] 22 cmH20  INTAKE / OUTPUT: I/O last 3 completed shifts: In: 2336.7 [I.V.:601.7; Other:140; NG/GT:1245; IV Piggyback:350] Out: 4200 [Urine:4200]  PHYSICAL EXAMINATION: General: increased work of breathing on vent HENT: NCAT, ETT in place PULM: intermittent poor ventilation CV: RRR, no clear murmur, gallop or rub GI: BS+, soft, nontender Derm: improved upper extremity edema Neuro:mildly agitated on vent, stirs to touch  LABS:  BMET  Recent Labs Lab 09/14/15 0500 09/15/15 0500 09/16/15 0355  NA 144  144 142 145  144  K 3.8  3.8 3.9 4.3  4.2  CL 105  105 102 103  102  CO2 30  29 30  32  32  BUN 31*  30* 32* 44*  45*  CREATININE 0.88  0.87 0.83 0.90  0.93  GLUCOSE 145*  142* 111* 102*  103*    Electrolytes  Recent Labs Lab 09/14/15 0500 09/15/15 0500 09/16/15 0355  CALCIUM 8.5*  8.5* 8.4* 8.7*  8.7*  MG 2.1 2.3 2.4  PHOS 3.2 4.0 4.3    CBC  Recent Labs Lab 09/13/15 0504 09/14/15 0500 09/15/15 0500  WBC 17.4* 17.8* 16.6*  HGB 8.2* 7.8* 8.0*  HCT 25.6* 23.9* 24.5*  PLT 307 264  278    Coag's No results for input(s): APTT, INR in the last 168 hours.  Sepsis Markers  Recent Labs Lab 09/09/15 1000  09/11/15 0328 09/13/15 0504 09/14/15 0500  LATICACIDVEN 1.0  --   --   --   --   PROCALCITON  --   < > 0.39 0.39 0.39  < > = values in this interval not displayed.  ABG  Recent Labs Lab 09/11/15 0310 09/13/15 0540 09/13/15 0947  PHART 7.438 7.413 7.376  PCO2ART 37.6 42.2 47.0*  PO2ART 98.2 52.3* 57.8*    Liver Enzymes  Recent Labs Lab 09/09/15 1000  09/14/15 0500 09/15/15 0500 09/16/15 0355  AST 55*  --   --   --   --   ALT 16  --   --   --   --   ALKPHOS 94  --   --   --   --   BILITOT 0.6  --   --   --   --   ALBUMIN 2.9*  < > 2.3* 2.3* 2.3*  < > = values in this interval not displayed.  Cardiac Enzymes No results for input(s): TROPONINI, PROBNP in the last 168 hours.  Glucose  Recent Labs Lab 09/15/15 1139 09/15/15 1523  09/15/15 2001 09/15/15 2317 09/16/15 0324 09/16/15 0748  GLUCAP 156* 117* 103* 112* 97 89    Imaging  6/19 CXR images reviewed> persistent bilateral air space disease without significant pleural effusion on R, perhaps trace effusion on left, ETT in place, PICC R side in place  STUDIES:  TTE 6/15:  Moderate LVH with EF 70-75%. Normal regional wall motion. Pulmonary artery systolic pressure 46 mmHg. RV normal in size. Port CXR 6/16: B/L opacities. Slightly improved. Images reviewed Port CXR 6/17:  Endotracheal tube 5.2 cm above carina. Enteric feeding tube coursing below diaphragm. Patchy bilateral alveolar opacities.  MICROBIOLOGY: MRSA PCR 6/14:  Positive Blood Ctx x2 6/14 >>  Urine Ctx 6/14:  Negative  Urine Strep Ag 6/14:  Negative Urine Legionella Ag 6/14: Negative Tracheal Asp Ctx 6/15:  Negative Tracheal Asp Ctx 6/18>> MRSA  ANTIBIOTICS: Rocephin 6/14 >> 6/19 Azithromycin 6/14 >> 6/19  Cefepime 6/19 > 6/21 Vanc 6/19 >   SIGNIFICANT EVENTS: 6/14 - Admission to [facility], intubation,  transfer to [facility]  LINES/TUBES: OETT 6/14>> RUE TL PICC 6/17>> FOLEY 6/14>> OGT 6/14>> PIV x2  ASSESSMENT / PLAN:  PULMONARY A: ARDS > worsened over weeked 6/17, stable 6/20 Acute Hypoxic Respiratory Failure  Severe CAP > HCAP now H/O OSA - On CPAP P:   Advance ETT 3 cm Wean to pressure support as long as tolerated  Continue full vent support at night First FiO2 and PEEP, aiming to keep O2 saturation > 92% Continue Duoneb q4hr  VAP prevention Goal CVP <4 Diurese again today  CARDIOVASCULAR A:  Sinus tach in setting of acute illness > improving H/O HTN P:  Monitor on telemetry  Vitals per unit protocol  RENAL A:   Hypernatremia - improved P:   Monitor BMET and UOP Replace electrolytes as needed Continue free water Give KCL with lasix  GASTROINTESTINAL A:   No acute issues H/O Hepatitis C Constipation  P:   Pepcid IV q12hr Continue tube feeds Senna prn  HEMATOLOGIC/ONCOLOGIC A:   Anemia - Mild. No signs of active bleeding H/O Brain/Pituitary Tumor - Resected 2012 H/O Right Renal Cancer  P:  SCDs Heparin [place] q8hr Monitor for bleeding  INFECTIOUS A:   Sepsis Severe CAP on admission HCAP > MRSA pneumonia P:   Treat MRSA pneumonia with Vanc x7 days > stop date 6/24 given ongoing fever  ENDOCRINE A:   H/O DM Type 2 - BG controlled H/O Pituitary Tumor - S/P Resection  P:   Accu-Checks q4hr SSI per Moderate Algorithm Levemir 10u qhs  NEUROLOGIC A:   Sedation on Ventilator H/O Brain/Pituitary Tumor - Resected 2012 H/O Anxiety/Depression H/O Chronic Pain - S/P Spinal Cord Stimulator  P:   RASS goal: -2 Fentanyl gtt & IV prn Propofol gtt & IV versed prn Lyrica bid VT  FAMILY  - Updates: No family at bedside 6/21. Son Torrin Urata updated 6/21 via phone by Dr. Lake Bells.    External total of 35 minutes of critical care time today caring for the patient and reviewing the patient's electronic medical record.  Roselie Awkward, MD [place] [facility] Pager: [phone] Cell: [phone] After 3pm or if no response, call [phone]   09/16/2015, 9:17 AM

## 2015-09-16 NOTE — Progress Notes (Signed)
Nutrition Follow-up  DOCUMENTATION CODES:   Obesity unspecified  INTERVENTION:  - Continue Vital High Protein @ 20 mL/hr with 60 mL Prostat BID. - Continue PEPuP protocol.  - Continue 200 mL free water every 6 hours per previous MD order; MD/NP to adjust as warranted. - RD will follow-up 6/23.   NUTRITION DIAGNOSIS:   Inadequate oral intake related to inability to eat as evidenced by NPO status. -ongoing  GOAL:   Provide needs based on ASPEN/SCCM guidelines -meeting.  MONITOR:   Vent status, TF tolerance, Weight trends, Labs, Skin, I & O's  ASSESSMENT:   70 y.o. female with medical history significant of HTN, tobacco abuse, DM type 2, OSA on CPAP, presented to the ED via ambulance with complaints of shortness of breath for the last 3 days which significantly worsened last night. She reports an associated cough with production of white sputum as well as chest soreness with deep inhalation, wheeze, and slight fever. She denies any pain while coughing.  6/22 Pt currently receiving Vital High Protein @ 20 mL/hr with 60 mL Prostat BID with 200 mL free water every 6 hours which is providing 880 kcal, 102 grams of protein, and 1201 mL free water. OGT remains in place. RN providing Prostat at time of RD visit. She states that vent wean attempted x6 hours yesterday but that pt became agitated; also reports agitation with advancement of ETT.  Patient is currently intubated on ventilator support MV: 14.4 L/min Temp (24hrs), Avg:100 F (37.8 C), Min:98.5 F (36.9 C), Max:101.5 F (38.6 C) Propofol: 9.5 ml/hr (251 kcal)  Continue current TF regimen and RD will follow-up 6/23. Meeting needs. Medications reviewed; 40 mg IV Lasix every 6 hours x2 doses, sliding scale Novolog, 10 units Levemir/day, 40 mEq KCl BID x2 doses. Labs reviewed; CBGs: 89 and 97 mg/dL today, BUN: 44 mg/dL, Ca: 8.7 mg/dL.    6/21 - OGT remains in place and pt receiving Vital High Protein @ 45 mL/hr with 200 mL free  water every 6 hours which is providing 1080 kcal, 95 grams of protein, and 1703 mL free water.  - Patient is currently intubated on ventilator support; MV: 13.7 L/min; Temp (24hrs), Max:100.5 F (38.1 C); Propofol: 14.2 ml/hr (375 kcal) - Will change TF regimen: Vital High Protein @ 20 mL/hr with 60 mL Prostat BID which (including kcal from Propofol) will provide 1255 kcal, 102 grams of protein, and 401 mL free water.  - Noted that kcal will exceed maximum estimated needs by ~200 kcal; will adjust as able tomorrow to avoid prolonged overfeeding of pt.  - Current weight has trended back down to admission weight.   6/19 - Propofol running over the weekend and is currently being held, per RN, for wakeup assessment.  - RN reports pt tolerating TF regimen well; Vital High Protein @ 45 mL/hr with MD order for 200 mL free water every 6 hours.  - OGT in place.  - RN reports pt continues without BM but that order in place for Senokot BID via tube. - Nutrition needs based on admission weight as weight has been trending up. - Patient is currently intubated on ventilator support; MV: 10 L/min; Temp (24hrs), Max:101.8 F (38.8 C); Propofol: currently being held for wake up assessment.   *SEE CHART FOR RD NOTES EARLIER IN ADMISSION.   Diet Order:  Diet NPO time specified  Skin:  Reviewed, no issues  Last BM:  6/19  Height:   Ht Readings from Last 1 Encounters:  08/31/2015 5\' 1"  (1.549 m)    Weight:   Wt Readings from Last 1 Encounters:  09/16/15 165 lb 2 oz (74.9 kg)    Ideal Body Weight:  47.73 kg (kg)  BMI:  Body mass index is 31.22 kg/(m^2).  Estimated Nutritional Needs:   Kcal:  [id number] (11-14 kcal/kg actual body weight)  Protein:  95 grams (2 grams/kg IBW)  Fluid:  > 1L/day  EDUCATION NEEDS:   No education needs identified at this time     Jarome Matin, MS, RD, LDN Inpatient Clinical Dietitian Pager # [phone] After hours/weekend pager # [phone]

## 2015-09-17 ENCOUNTER — Inpatient Hospital Stay (HOSPITAL_COMMUNITY): Payer: Medicare HMO

## 2015-09-17 ENCOUNTER — Other Ambulatory Visit: Payer: Self-pay

## 2015-09-17 LAB — BASIC METABOLIC PANEL
ANION GAP: 9 (ref 5–15)
BUN: 56 mg/dL — AB (ref 6–20)
CALCIUM: 8.9 mg/dL (ref 8.9–10.3)
CO2: 33 mmol/L — AB (ref 22–32)
CREATININE: 0.94 mg/dL (ref 0.44–1.00)
Chloride: 103 mmol/L (ref 101–111)
GFR calc Af Amer: >60 mL/min (ref >60)
GFR calc non Af Amer: >60 mL/min (ref >60)
GLUCOSE: 124 mg/dL — AB (ref 65–99)
Potassium: 4.6 mmol/L (ref 3.5–5.1)
Sodium: 145 mmol/L (ref 135–145)

## 2015-09-17 LAB — CBC WITH DIFFERENTIAL/PLATELET
BASOS PCT: 0 %
Basophils Absolute: 0 10*3/uL (ref 0.0–0.1)
EOS ABS: 0.7 10*3/uL (ref 0.0–0.7)
Eosinophils Relative: 4 %
HEMATOCRIT: 27.4 % — AB (ref 36.0–46.0)
Hemoglobin: 8.4 g/dL — ABNORMAL LOW (ref 12.0–15.0)
LYMPHS ABS: 1.4 10*3/uL (ref 0.7–4.0)
Lymphocytes Relative: 8 %
MCH: 27 pg (ref 26.0–34.0)
MCHC: 30.7 g/dL (ref 30.0–36.0)
MCV: 88.1 fL (ref 78.0–100.0)
Monocytes Absolute: 1.1 10*3/uL — ABNORMAL HIGH (ref 0.1–1.0)
Monocytes Relative: 6 %
NEUTROS ABS: 14.3 10*3/uL — AB (ref 1.7–7.7)
Neutrophils Relative %: 82 %
PLATELETS: 330 10*3/uL (ref 150–400)
RBC: 3.11 MIL/uL — ABNORMAL LOW (ref 3.87–5.11)
RDW: 17.4 % — AB (ref 11.5–15.5)
WBC: 17.5 10*3/uL — ABNORMAL HIGH (ref 4.0–10.5)

## 2015-09-17 LAB — GLUCOSE, CAPILLARY
GLUCOSE-CAPILLARY: 120 mg/dL — AB (ref 65–99)
GLUCOSE-CAPILLARY: 126 mg/dL — AB (ref 65–99)
Glucose-Capillary: 119 mg/dL — ABNORMAL HIGH (ref 65–99)
Glucose-Capillary: 130 mg/dL — ABNORMAL HIGH (ref 65–99)
Glucose-Capillary: 138 mg/dL — ABNORMAL HIGH (ref 65–99)
Glucose-Capillary: 168 mg/dL — ABNORMAL HIGH (ref 65–99)

## 2015-09-17 LAB — MAGNESIUM: MAGNESIUM: 2.6 mg/dL — AB (ref 1.7–2.4)

## 2015-09-17 MED ORDER — PANTOPRAZOLE SODIUM 40 MG PO PACK
40.0000 mg | PACK | ORAL | Status: DC
  Administered 2015-09-17 – 2015-09-26 (×10): 40 mg
  Filled 2015-09-17 (×9): qty 20

## 2015-09-17 MED ORDER — ADULT MULTIVITAMIN LIQUID CH
15.0000 mL | Freq: Every day | ORAL | Status: DC
  Administered 2015-09-17 – 2015-09-22 (×6): 15 mL
  Filled 2015-09-17 (×6): qty 15

## 2015-09-17 MED ORDER — FUROSEMIDE 10 MG/ML IJ SOLN
40.0000 mg | Freq: Once | INTRAMUSCULAR | Status: AC
  Administered 2015-09-17: 40 mg via INTRAVENOUS
  Filled 2015-09-17: qty 4

## 2015-09-17 NOTE — Progress Notes (Signed)
Nutrition Follow-up  DOCUMENTATION CODES:   Obesity unspecified  INTERVENTION:  - Continue current TF order: Vital High Protein @ 20 mL/hr with 60 mL Prostat BID. This regimen + kcal from current Propofol rate provides 1139 kcal, 102 grams of protein, and 401 mL free water. - Continue PEPup protocol. - Free water flush per MD/NP. - Will order daily multivitamin given low TF rate for several days. - RD will follow-up 6/26.  NUTRITION DIAGNOSIS:   Inadequate oral intake related to inability to eat as evidenced by NPO status. -ongoing  GOAL:   Provide needs based on ASPEN/SCCM guidelines -meeting, ongoing  MONITOR:   Vent status, TF tolerance, Weight trends, Labs, I & O's  ASSESSMENT:   70 y.o. female with medical history significant of HTN, tobacco abuse, DM type 2, OSA on CPAP, presented to the ED via ambulance with complaints of shortness of breath for the last 3 days which significantly worsened last night. She reports an associated cough with production of white sputum as well as chest soreness with deep inhalation, wheeze, and slight fever. She denies any pain while coughing.  6/23 Pt continues with OGT and no family/visitors in the room. Pt currently receiving Vital High Protein @ 20 mL/hr with 60 mL Prostat BID and no free water flush programmed into Kangaroo pump. This regimen is providing 880 kcal, 102 grams of protein, and 401 mL free water. Weight now 2 lbs lower than admission weight on 09/17/2015 (161 lbs versus 163 lbs); will continue to monitor weight trends and adjust nutrition needs as warranted. Nutrition needs to remain the same at this time.   Patient is currently intubated on ventilator support MV: 9.8 L/min Temp (24hrs), Avg:100.1 F (37.8 C), Min:98.8 F (37.1 C), Max:101.4 F (38.6 C) Propofol: 9.5 ml/hr (259 kcal)  Will continue current TF regimen and add daily multivitamin given pt not meeting micronutrient needs with ongoing low TF rate. Medications  reviewed; 40 mg IV Lasix x1 dose today, sliding scale Novolog, 40 mg Protonix/day. Labs reviewed; CBGs: 130 and 138 mg/dL, BUN: 56 mg/dL, Mg: 2.6 mg/dL.  Drips: Fentanyl @ 125 mcg/hr, Propofol @ 20 mcg/kg/min.   6/22 - Pt currently receiving Vital High Protein @ 20 mL/hr with 60 mL Prostat BID with 200 mL free water every 6 hours which is providing 880 kcal, 102 grams of protein, and 1201 mL free water.  - OGT remains in place. - RN states that vent wean attempted x6 hours yesterday but that pt became agitated; also reports agitation with advancement of ETT. - Patient is currently intubated on ventilator support; MV: 14.4 L/min; Temp (24hrs), Max:101.5 F (38.6 C); Propofol: 9.5 ml/hr (251 kcal)  6/21 - OGT remains in place and pt receiving Vital High Protein @ 45 mL/hr with 200 mL free water every 6 hours which is providing 1080 kcal, 95 grams of protein, and 1703 mL free water.  - Patient is currently intubated on ventilator support; MV: 13.7 L/min; Temp (24hrs), Max:100.5 F (38.1 C); Propofol: 14.2 ml/hr (375 kcal) - Will change TF regimen: Vital High Protein @ 20 mL/hr with 60 mL Prostat BID which (including kcal from Propofol) will provide 1255 kcal, 102 grams of protein, and 401 mL free water.  - Noted that kcal will exceed maximum estimated needs by ~200 kcal; will adjust as able tomorrow to avoid prolonged overfeeding of pt.  - Current weight has trended back down to admission weight.    *SEE CHART FOR RD NOTES EARLIER IN ADMISSION.  Diet Order:  Diet NPO time specified  Skin:  Reviewed, no issues  Last BM:  6/20  Height:   Ht Readings from Last 1 Encounters:  09/05/2015 5\' 1"  (1.549 m)    Weight:   Wt Readings from Last 1 Encounters:  09/17/15 161 lb 13.1 oz (73.4 kg)    Ideal Body Weight:  47.73 kg (kg)  BMI:  Body mass index is 30.59 kg/(m^2).  Estimated Nutritional Needs:   Kcal:  [id number] (11-14 kcal/kg actual body weight)  Protein:  95 grams (2  grams/kg IBW)  Fluid:  > 1L/day  EDUCATION NEEDS:   No education needs identified at this time     Jarome Matin, MS, RD, LDN Inpatient Clinical Dietitian Pager # [phone] After hours/weekend pager # [phone]

## 2015-09-17 NOTE — Progress Notes (Signed)
PULMONARY / CRITICAL CARE MEDICINE   Name: TENIYA MATRAS MRN: [id number] DOB: [birth year]    ADMISSION DATE:  09/03/2015 CONSULTATION DATE:  08/28/2015  REFERRING MD:  Kathie Dike, M.D. / AP TRH  CHIEF COMPLAINT:  Dyspnea  BRIEF:   70 y/o female with DM2 with ARDS and CAP initially admitted to [facility] on 6/14.  Developed MRSA pneumonia 6/18.  SUBJECTIVE:  Tolerating pressure support 15/5.  VITAL SIGNS: BP 88/59 mmHg  Pulse 100  Temp(Src) 100.9 F (38.3 C) (Oral)  Resp 23  Ht 5\' 1"  (1.549 m)  Wt 161 lb 13.1 oz (73.4 kg)  BMI 30.59 kg/m2  SpO2 100%  HEMODYNAMICS:    VENTILATOR SETTINGS: Vent Mode:  [-] PRVC FiO2 (%):  [40 %] 40 % Set Rate:  [14 bmp] 14 bmp Vt Set:  [450 mL] 450 mL PEEP:  [5 cmH20] 5 cmH20 Plateau Pressure:  [15 cmH20-25 cmH20] 16 cmH20  INTAKE / OUTPUT:  Intake/Output Summary (Last 24 hours) at 09/17/15 0829 Last data filed at 09/17/15 0700  Gross per 24 hour  Intake 1645.78 ml  Output   3275 ml  Net -1629.22 ml   PHYSICAL EXAMINATION: General: Sedated Neuro: RASS -2 HEENT: ETT in place Cardiac: regular Chest: b/l crackles Abd: soft, nontender Ext: no edema Skin: no rashes  LABS:  BMET  Recent Labs Lab 09/15/15 0500 09/16/15 0355 09/17/15 0448  NA 142 145  144 145  K 3.9 4.3  4.2 4.6  CL 102 103  102 103  CO2 30 32  32 33*  BUN 32* 44*  45* 56*  CREATININE 0.83 0.90  0.93 0.94  GLUCOSE 111* 102*  103* 124*    Electrolytes  Recent Labs Lab 09/14/15 0500 09/15/15 0500 09/16/15 0355 09/17/15 0448  CALCIUM 8.5*  8.5* 8.4* 8.7*  8.7* 8.9  MG 2.1 2.3 2.4 2.6*  PHOS 3.2 4.0 4.3  --     CBC  Recent Labs Lab 09/14/15 0500 09/15/15 0500 09/17/15 0448  WBC 17.8* 16.6* 17.5*  HGB 7.8* 8.0* 8.4*  HCT 23.9* 24.5* 27.4*  PLT 264 278 330    Coag's No results for input(s): APTT, INR in the last 168 hours.  Sepsis Markers  Recent Labs Lab 09/11/15 0328 09/13/15 0504 09/14/15 0500   PROCALCITON 0.39 0.39 0.39    ABG  Recent Labs Lab 09/11/15 0310 09/13/15 0540 09/13/15 0947  PHART 7.438 7.413 7.376  PCO2ART 37.6 42.2 47.0*  PO2ART 98.2 52.3* 57.8*    Liver Enzymes  Recent Labs Lab 09/14/15 0500 09/15/15 0500 09/16/15 0355  ALBUMIN 2.3* 2.3* 2.3*    Cardiac Enzymes No results for input(s): TROPONINI, PROBNP in the last 168 hours.  Glucose  Recent Labs Lab 09/16/15 0748 09/16/15 1310 09/16/15 1556 09/16/15 2000 09/16/15 2325 09/17/15 0406  GLUCAP 89 138* 131* 128* 119* 130*    Imaging Dg Chest Port 1 View  09/17/2015  CLINICAL DATA:  Respiratory failure. EXAM: PORTABLE CHEST 1 VIEW COMPARISON:  09/16/2015.  09/13/2015.  09/13/2015. FINDINGS: Endotracheal tube and NG tube in stable position. Right PICC line stable position. Cardiomegaly. Persistent bilateral pulmonary infiltrates, slightly improved from prior exam. Low lung volumes. No significant pleural effusion. No pneumothorax. Heart size stable. Neurostimulator noted in stable position. IMPRESSION: 1. Lines and tubes in stable position. 2. Persistent bilateral pulmonary infiltrates, slightly improved from prior exam. Low lung volumes. Electronically Signed   By: Marcello Moores  Register   On: 09/17/2015 07:17   Dg Chest Port 1 [address]  09/16/2015  CLINICAL DATA:  Respiratory failure. EXAM: PORTABLE CHEST 1 VIEW COMPARISON:  09/15/2015. FINDINGS: Endotracheal tube, NG tube, right PICC line stable position. Heart size stable. Patchy bilateral pulmonary infiltrates. No interim change. Low lung volumes. No pleural effusion or pneumothorax. Neurostimulator. IMPRESSION: 1. Lines and tubes in stable position. 2. Patchy bilateral pulmonary infiltrates . No interim change. Low lung volumes. Electronically Signed   By: Marcello Moores  Register   On: 09/16/2015 06:59    STUDIES:  TTE 6/15 >> Moderate LVH with EF 70-75%. Normal regional wall motion. Pulmonary artery systolic pressure 46 mmHg. RV normal in  size.  MICROBIOLOGY: MRSA PCR 6/14:  Positive Blood Ctx x2 6/14 >>  Urine Ctx 6/14:  Negative  Urine Strep Ag 6/14:  Negative Urine Legionella Ag 6/14: Negative Tracheal Asp Ctx 6/15:  Negative Tracheal Asp Ctx 6/18>> MRSA  ANTIBIOTICS: Rocephin 6/14 >> 6/19 Azithromycin 6/14 >> 6/19  Cefepime 6/19 > 6/21 Vancomycin 6/19 >   SIGNIFICANT EVENTS: 6/14 - Admission to [facility], intubation, transfer to [facility]  LINES/TUBES: ETT 6/14>> RUE TL PICC 6/17>>  ASSESSMENT / PLAN:  PULMONARY A: Acute hypoxic respiratory failure from MRSA PNA, ARDS. Hx of OSA. P:   Pressure support wean as tolerated F/u CXR Prn BDs  CARDIOVASCULAR A:  Severe sepsis from PNA. Hx of HTN. P:  Monitor hemodynamics.  RENAL A:   Hypernatremia - resolved P:   Monitor renal fx, urine outpt, electrolytes  GASTROINTESTINAL A:   Nutrition. Hx of Hepatitis C. P:   Tube feeds Protonix for SUP  HEMATOLOGIC/ONCOLOGIC A:   Anemia of critical illness and chronic disease. Hx of Rt renal cancer. P:  F/u CBC Heparin [place]   INFECTIOUS A:   Severe sepsis 2nd to MRSA PNA. P:   Plan to continue vancomycin through 6/24  ENDOCRINE A:   Hx DM Type 2 - BG controlled. Hx Pituitary Tumor - S/P Resection. P:   SSI with levemir  NEUROLOGIC A:   Acute metabolic encephalopathy. Hx of anxiety, depression. Hx of chronic pain s/p spinal cord stimulator. P:   RASS goal: -1 Lyrica bid VT   CC time 31 minutes.  Chesley Mires, MD [facility] Pulmonary/Critical Care 09/17/2015, 8:47 AM Pager:  [phone] After 3pm call: [phone]

## 2015-09-18 ENCOUNTER — Inpatient Hospital Stay (HOSPITAL_COMMUNITY): Payer: Medicare HMO

## 2015-09-18 LAB — BASIC METABOLIC PANEL
Anion gap: 8 (ref 5–15)
BUN: 49 mg/dL — AB (ref 6–20)
CHLORIDE: 103 mmol/L (ref 101–111)
CO2: 33 mmol/L — AB (ref 22–32)
CREATININE: 0.79 mg/dL (ref 0.44–1.00)
Calcium: 8.7 mg/dL — ABNORMAL LOW (ref 8.9–10.3)
GFR calc Af Amer: >60 mL/min (ref >60)
GFR calc non Af Amer: >60 mL/min (ref >60)
GLUCOSE: 128 mg/dL — AB (ref 65–99)
Potassium: 4 mmol/L (ref 3.5–5.1)
Sodium: 144 mmol/L (ref 135–145)

## 2015-09-18 LAB — GLUCOSE, CAPILLARY
GLUCOSE-CAPILLARY: 100 mg/dL — AB (ref 65–99)
GLUCOSE-CAPILLARY: 128 mg/dL — AB (ref 65–99)
Glucose-Capillary: 122 mg/dL — ABNORMAL HIGH (ref 65–99)
Glucose-Capillary: 108 mg/dL — ABNORMAL HIGH (ref 65–99)
Glucose-Capillary: 160 mg/dL — ABNORMAL HIGH (ref 65–99)
Glucose-Capillary: 154 mg/dL — ABNORMAL HIGH (ref 65–99)

## 2015-09-18 LAB — BLOOD GAS, ARTERIAL
ACID-BASE EXCESS: 7 mmol/L — AB (ref 0.0–2.0)
BICARBONATE: 32.2 meq/L — AB (ref 20.0–24.0)
Drawn by: 405301
FIO2: 0.4
LHR: 14 {breaths}/min
O2 Saturation: 95.9 %
PATIENT TEMPERATURE: 100.9
PEEP: 10 cmH2O
PO2 ART: 99.2 mmHg (ref 80.0–100.0)
TCO2: 30.4 mmol/L (ref 0–100)
VT: 450 mL
pCO2 arterial: 56 mmHg — ABNORMAL HIGH (ref 35.0–45.0)
pH, Arterial: 7.385 (ref 7.350–7.450)

## 2015-09-18 LAB — CBC
HCT: 25.5 % — ABNORMAL LOW (ref 36.0–46.0)
Hemoglobin: 7.9 g/dL — ABNORMAL LOW (ref 12.0–15.0)
MCH: 26.9 pg (ref 26.0–34.0)
MCHC: 31 g/dL (ref 30.0–36.0)
MCV: 86.7 fL (ref 78.0–100.0)
PLATELETS: 356 10*3/uL (ref 150–400)
RBC: 2.94 MIL/uL — ABNORMAL LOW (ref 3.87–5.11)
RDW: 16.9 % — AB (ref 11.5–15.5)
WBC: 18 10*3/uL — ABNORMAL HIGH (ref 4.0–10.5)

## 2015-09-18 LAB — MAGNESIUM: Magnesium: 2.6 mg/dL — ABNORMAL HIGH (ref 1.7–2.4)

## 2015-09-18 MED ORDER — FUROSEMIDE 10 MG/ML IJ SOLN
40.0000 mg | Freq: Once | INTRAMUSCULAR | Status: AC
  Administered 2015-09-18: 40 mg via INTRAVENOUS

## 2015-09-18 MED ORDER — FUROSEMIDE 10 MG/ML IJ SOLN
INTRAMUSCULAR | Status: AC
  Filled 2015-09-18: qty 4

## 2015-09-18 MED ORDER — DEXMEDETOMIDINE HCL IN NACL 400 MCG/100ML IV SOLN
0.4000 ug/kg/h | INTRAVENOUS | Status: DC
  Administered 2015-09-18: 1 ug/kg/h via INTRAVENOUS
  Administered 2015-09-18: 0.4 ug/kg/h via INTRAVENOUS
  Administered 2015-09-18: 0.7 ug/kg/h via INTRAVENOUS
  Administered 2015-09-19: 1 ug/kg/h via INTRAVENOUS
  Administered 2015-09-19: 0.9 ug/kg/h via INTRAVENOUS
  Administered 2015-09-19: 0.8 ug/kg/h via INTRAVENOUS
  Administered 2015-09-20: 0.5 ug/kg/h via INTRAVENOUS
  Administered 2015-09-20: 1 ug/kg/h via INTRAVENOUS
  Administered 2015-09-20: 0.4 ug/kg/h via INTRAVENOUS
  Administered 2015-09-20: 0.5 ug/kg/h via INTRAVENOUS
  Administered 2015-09-21: 0.3 ug/kg/h via INTRAVENOUS
  Administered 2015-09-22: 0.4 ug/kg/h via INTRAVENOUS
  Filled 2015-09-18 (×2): qty 50
  Filled 2015-09-18: qty 100
  Filled 2015-09-18: qty 50
  Filled 2015-09-18 (×9): qty 100

## 2015-09-18 MED ORDER — PHENYLEPHRINE HCL 10 MG/ML IJ SOLN
0.0000 ug/min | INTRAVENOUS | Status: DC
  Administered 2015-09-18 – 2015-09-19 (×2): 20 ug/min via INTRAVENOUS
  Administered 2015-09-19 (×3): 16 ug/min via INTRAVENOUS
  Administered 2015-09-20: 13 ug/min via INTRAVENOUS
  Administered 2015-09-21: 30 ug/min via INTRAVENOUS
  Administered 2015-09-21: 10 ug/min via INTRAVENOUS
  Administered 2015-09-23: 65 ug/min via INTRAVENOUS
  Administered 2015-09-23: 60 ug/min via INTRAVENOUS
  Administered 2015-09-23: 10 ug/min via INTRAVENOUS
  Filled 2015-09-18 (×15): qty 1

## 2015-09-18 NOTE — Procedures (Signed)
Lavaged and suctioned patient for moderate amount of thick, pink tinged secretions. Patient tolerated well.

## 2015-09-18 NOTE — Progress Notes (Addendum)
eLink Physician-Brief Progress Note Patient Name: Rebecca Richards DOB: 1945/08/03 MRN: [id number]   Date of Service  09/18/2015  HPI/Events of Note  Respiratory Distress - Ventilator dyssynchrony. RR = 34 and sats decreased >> increase FiO2 requirement. CXR this AM >> pulmonary congestion.   eICU Interventions  Will order: 1. Lasix 40 mg IV STAT. 2. Portable CXR STAT. 3. Increase PEEP to 10. 4. Increase sedative infusions as needed. 5. ABG at 7:30 PM.     Intervention Category Intermediate Interventions: Respiratory distress - evaluation and management  Lysle Dingwall 09/18/2015, 6:34 PM

## 2015-09-18 NOTE — Progress Notes (Signed)
eLink Physician-Brief Progress Note Patient Name: Rebecca Richards DOB: 1945-10-09 MRN: [id number]   Date of Service  09/18/2015  HPI/Events of Note  Hypotension - BP 63/32.   eICU Interventions  Will order: 1. Monitor CVP. 2. Phenylephrine IV infusion. Titrate to MAP >= 65. 3. Hold Precedex and Fentanyl IV infusion.      Intervention Category Major Interventions: Hypotension - evaluation and management  Sommer,Steven Cornelia Copa 09/18/2015, 7:21 PM

## 2015-09-18 NOTE — Progress Notes (Addendum)
PULMONARY / CRITICAL CARE MEDICINE   Name: Rebecca Richards MRN: [id number] DOB: [birth year]    ADMISSION DATE:  09/16/2015 CONSULTATION DATE:  09/24/2015  REFERRING MD:  Kathie Dike, M.D. / AP [facility]  CHIEF COMPLAINT:  Dyspnea  BRIEF:   70 y/o female with DM2 with ARDS and CAP initially admitted to [facility] on 6/14.  Developed MRSA pneumonia 6/18.  SUBJECTIVE:  Tolerated pressure support 15/5 on 6/23 but high RR on wean this am Febrile No obvious pain Good UO with lasix  VITAL SIGNS: BP 118/57 mmHg  Pulse 90  Temp(Src) 100.7 F (38.2 C) (Oral)  Resp 30  Ht 5\' 1"  (1.549 m)  Wt 161 lb 13.1 oz (73.4 kg)  BMI 30.59 kg/m2  SpO2 95%  HEMODYNAMICS:    VENTILATOR SETTINGS: Vent Mode:  [-] PRVC FiO2 (%):  [30 %-40 %] 30 % Set Rate:  [14 bmp] 14 bmp Vt Set:  [450 mL] 450 mL PEEP:  [5 cmH20] 5 cmH20 Plateau Pressure:  [22 cmH20-29 cmH20] 25 cmH20  INTAKE / OUTPUT:  Intake/Output Summary (Last 24 hours) at 09/18/15 1101 Last data filed at 09/18/15 1000  Gross per 24 hour  Intake 1345.3 ml  Output   2220 ml  Net -874.7 ml   PHYSICAL EXAMINATION: General: Sedated, intubated , chr ill Neuro: RASS -2 HEENT: ETT in place Cardiac: regular Chest: b/l crackles Abd: soft, nontender Ext: no edema Skin: no rashes  LABS:  BMET  Recent Labs Lab 09/16/15 0355 09/17/15 0448 09/18/15 0505  NA 145  144 145 144  K 4.3  4.2 4.6 4.0  CL 103  102 103 103  CO2 32  32 33* 33*  BUN 44*  45* 56* 49*  CREATININE 0.90  0.93 0.94 0.79  GLUCOSE 102*  103* 124* 128*    Electrolytes  Recent Labs Lab 09/14/15 0500 09/15/15 0500 09/16/15 0355 09/17/15 0448 09/18/15 0505  CALCIUM 8.5*  8.5* 8.4* 8.7*  8.7* 8.9 8.7*  MG 2.1 2.3 2.4 2.6* 2.6*  PHOS 3.2 4.0 4.3  --   --     CBC  Recent Labs Lab 09/15/15 0500 09/17/15 0448 09/18/15 0505  WBC 16.6* 17.5* 18.0*  HGB 8.0* 8.4* 7.9*  HCT 24.5* 27.4* 25.5*  PLT 278 330 356    Coag's No results for  input(s): APTT, INR in the last 168 hours.  Sepsis Markers  Recent Labs Lab 09/13/15 0504 09/14/15 0500  PROCALCITON 0.39 0.39    ABG  Recent Labs Lab 09/13/15 0540 09/13/15 0947  PHART 7.413 7.376  PCO2ART 42.2 47.0*  PO2ART 52.3* 57.8*    Liver Enzymes  Recent Labs Lab 09/14/15 0500 09/15/15 0500 09/16/15 0355  ALBUMIN 2.3* 2.3* 2.3*    Cardiac Enzymes No results for input(s): TROPONINI, PROBNP in the last 168 hours.  Glucose  Recent Labs Lab 09/17/15 1355 09/17/15 1742 09/17/15 1952 09/17/15 2355 09/18/15 0427 09/18/15 0816  GLUCAP 168* 120* 126* 122* 108* 100*    Imaging Dg Chest Port 1 View  09/18/2015  CLINICAL DATA:  ARDS and MRSA pneumonia. EXAM: PORTABLE CHEST 1 VIEW COMPARISON:  09/17/2015 FINDINGS: Endotracheal tube remains with the tip approximately 4 cm above the carina. Stable PICC line on right with catheter tip in the SVC. Nasogastric tube extends into the stomach. There is interval increased in pulmonary interstitial and vascular prominence throughout both lungs likely reflecting interstitial edema. No significant pleural effusions. The heart size is normal. Stable appearance of a thoracic spinal stimulator. IMPRESSION:  Diffusely increased pulmonary interstitial and vascular prominence likely reflects interstitial edema. Electronically Signed   By: Aletta Edouard M.D.   On: 09/18/2015 09:45   Dg Chest Port 1 View  09/17/2015  CLINICAL DATA:  Respiratory failure. EXAM: PORTABLE CHEST 1 VIEW COMPARISON:  09/16/2015.  09/13/2015.  09/20/2015. FINDINGS: Endotracheal tube and NG tube in stable position. Right PICC line stable position. Cardiomegaly. Persistent bilateral pulmonary infiltrates, slightly improved from prior exam. Low lung volumes. No significant pleural effusion. No pneumothorax. Heart size stable. Neurostimulator noted in stable position. IMPRESSION: 1. Lines and tubes in stable position. 2. Persistent bilateral pulmonary infiltrates,  slightly improved from prior exam. Low lung volumes. Electronically Signed   By: Marcello Moores  Register   On: 09/17/2015 07:17    STUDIES:  TTE 6/15 >> Moderate LVH with EF 70-75%. Normal regional wall motion. Pulmonary artery systolic pressure 46 mmHg. RV normal in size.  MICROBIOLOGY: MRSA PCR 6/14:  Positive Blood Ctx x2 6/14 >> ng Urine Ctx 6/14:  Negative  Urine Strep Ag 6/14:  Negative Urine Legionella Ag 6/14: Negative Tracheal Asp Ctx 6/15:  Negative Tracheal Asp Ctx 6/18>> MRSA  ANTIBIOTICS: Rocephin 6/14 >> 6/19 Azithromycin 6/14 >> 6/19  Cefepime 6/19 > 6/21 Vancomycin 6/19 >   SIGNIFICANT EVENTS: 6/14 - Admission to [facility], intubation, transfer to [facility]  LINES/TUBES: ETT 6/14>> RUE TL PICC 6/17>>  ASSESSMENT / PLAN:  PULMONARY A: Acute hypoxic respiratory failure from MRSA PNA, ARDS. Hx of OSA. P:   Pressure support wean as tolerated F/u CXR Prn BDs  CARDIOVASCULAR A:  Severe sepsis from PNA. Hx of HTN. P:  Monitor hemodynamics.  RENAL A:   Hypernatremia - resolved P:   Monitor renal fx, urine outpt, electrolytes  GASTROINTESTINAL A:   Nutrition. Hx of Hepatitis C. P:   Ct Tube feeds Protonix for SUP  HEMATOLOGIC/ONCOLOGIC A:   Anemia of critical illness and chronic disease. Hx of Rt renal cancer. P:  F/u CBC Heparin [place]   INFECTIOUS A:   Severe sepsis 2nd to MRSA PNA. Fever P:   Plan to continue vancomycin 7-10ds  ENDOCRINE A:   Hx DM Type 2 - BG controlled. Hx Pituitary Tumor - S/P Resection. P:   SSI with levemir  NEUROLOGIC A:   Acute metabolic encephalopathy. Hx of anxiety, depression. Hx of chronic pain s/p spinal cord stimulator. P:   RASS goal: -1 Lyrica bid VT Dc propofol -use precedex instead to enable wean, minimise fent  If no progress over next 2 days , may have to plan for trach  CC time 31 minutes.   Kara Mead MD. Shade Flood. [place] [facility] Pager [phone] If no  response call [phone]    09/18/2015, 11:01 AM

## 2015-09-18 NOTE — Progress Notes (Signed)
Pt increasingly tachypenic 30-40, diapheretic, and desating 84-89%.  Called [facility] and spoke with Dr. Oletta Darter.  See orders.  Irven Baltimore, RN

## 2015-09-19 ENCOUNTER — Inpatient Hospital Stay (HOSPITAL_COMMUNITY): Payer: Medicare HMO

## 2015-09-19 LAB — RENAL FUNCTION PANEL
ALBUMIN: 2.2 g/dL — AB (ref 3.5–5.0)
Anion gap: 8 (ref 5–15)
BUN: 47 mg/dL — AB (ref 6–20)
CALCIUM: 8.7 mg/dL — AB (ref 8.9–10.3)
CHLORIDE: 103 mmol/L (ref 101–111)
CO2: 32 mmol/L (ref 22–32)
CREATININE: 0.8 mg/dL (ref 0.44–1.00)
GFR calc Af Amer: >60 mL/min (ref >60)
GFR calc non Af Amer: >60 mL/min (ref >60)
Glucose, Bld: 138 mg/dL — ABNORMAL HIGH (ref 65–99)
Phosphorus: 3.9 mg/dL (ref 2.5–4.6)
Potassium: 3.5 mmol/L (ref 3.5–5.1)
SODIUM: 143 mmol/L (ref 135–145)

## 2015-09-19 LAB — GLUCOSE, CAPILLARY
GLUCOSE-CAPILLARY: 165 mg/dL — AB (ref 65–99)
Glucose-Capillary: 128 mg/dL — ABNORMAL HIGH (ref 65–99)
Glucose-Capillary: 125 mg/dL — ABNORMAL HIGH (ref 65–99)
Glucose-Capillary: 147 mg/dL — ABNORMAL HIGH (ref 65–99)
Glucose-Capillary: 155 mg/dL — ABNORMAL HIGH (ref 65–99)

## 2015-09-19 LAB — CBC WITH DIFFERENTIAL/PLATELET
Basophils Absolute: 0 10*3/uL (ref 0.0–0.1)
Basophils Relative: 0 %
EOS PCT: 2 %
Eosinophils Absolute: 0.4 10*3/uL (ref 0.0–0.7)
HEMATOCRIT: 25.9 % — AB (ref 36.0–46.0)
Hemoglobin: 8.1 g/dL — ABNORMAL LOW (ref 12.0–15.0)
LYMPHS ABS: 1.9 10*3/uL (ref 0.7–4.0)
Lymphocytes Relative: 10 %
MCH: 27.6 pg (ref 26.0–34.0)
MCHC: 31.3 g/dL (ref 30.0–36.0)
MCV: 88.1 fL (ref 78.0–100.0)
MONOS PCT: 5 %
Monocytes Absolute: 0.9 10*3/uL (ref 0.1–1.0)
NEUTROS ABS: 15.4 10*3/uL — AB (ref 1.7–7.7)
Neutrophils Relative %: 83 %
Platelets: 438 10*3/uL — ABNORMAL HIGH (ref 150–400)
RBC: 2.94 MIL/uL — ABNORMAL LOW (ref 3.87–5.11)
RDW: 16.5 % — AB (ref 11.5–15.5)
WBC: 18.6 10*3/uL — ABNORMAL HIGH (ref 4.0–10.5)

## 2015-09-19 LAB — MAGNESIUM: MAGNESIUM: 2.4 mg/dL (ref 1.7–2.4)

## 2015-09-19 LAB — TRIGLYCERIDES: Triglycerides: 201 mg/dL — ABNORMAL HIGH (ref <150)

## 2015-09-19 LAB — VANCOMYCIN, TROUGH: VANCOMYCIN TR: 9 ug/mL — AB (ref 10.0–20.0)

## 2015-09-19 MED ORDER — POTASSIUM CHLORIDE 20 MEQ/15ML (10%) PO SOLN
20.0000 meq | ORAL | Status: AC
  Administered 2015-09-19 (×2): 20 meq
  Filled 2015-09-19 (×2): qty 15

## 2015-09-19 MED ORDER — LACTULOSE 10 GM/15ML PO SOLN
30.0000 g | Freq: Once | ORAL | Status: AC
Start: 2015-09-19 — End: 2015-09-19
  Administered 2015-09-19: 30 g
  Filled 2015-09-19: qty 45

## 2015-09-19 MED ORDER — VANCOMYCIN HCL 10 G IV SOLR
1500.0000 mg | INTRAVENOUS | Status: AC
  Administered 2015-09-19 – 2015-09-22 (×4): 1500 mg via INTRAVENOUS
  Filled 2015-09-19 (×4): qty 1500

## 2015-09-19 NOTE — Progress Notes (Signed)
eLink Physician-Brief Progress Note Patient Name: Rebecca Richards DOB: 04-Sep-1945 MRN: [id number]   Date of Service  09/19/2015  HPI/Events of Note  70 year old female with MRSA pneumonia. Last culture on 6/18. Leukocytosis trending upward. Patient remains febrile with last temperature 101.69F at midnight.   eICU Interventions  1. Continue vancomycin 2. Stat blood, urine, and tracheal aspirate cultures      Intervention Category Major Interventions: Sepsis - evaluation and management  Tera Partridge 09/19/2015, 2:27 AM

## 2015-09-19 NOTE — Progress Notes (Addendum)
Pharmacy Antibiotic Follow-up Note  Rebecca Richards is a 70 y.o. year-old female admitted on 09/24/2015.  Patient now on vancomycin only due to MRSA PNA.   Assessment/Plan: Remains afebrile: repeat Blood, Urine, Sputum ordered Last Vanc trough 6/22 Repeat Vanc trough today at 1100 (prior to 4th q24hr dose at 12n)  Temp (24hrs), Avg:100.9 F (38.3 C), Min:98.5 F (36.9 C), Max:102.4 F (39.1 C)   Recent Labs Lab 09/14/15 0500 09/15/15 0500 09/17/15 0448 09/18/15 0505 09/19/15 0245  WBC 17.8* 16.6* 17.5* 18.0* 18.6*     Recent Labs Lab 09/15/15 0500 09/16/15 0355 09/17/15 0448 09/18/15 0505 09/19/15 0245  CREATININE 0.83 0.90  0.93 0.94 0.79 0.80   Estimated Creatinine Clearance: 62.8 mL/min (by C-G formula based on Cr of 0.8).    Allergies  Allergen Reactions  . Lactose Intolerance (Gi)    Antimicrobials this admission: Azith/CTX 6/14 >> 6/19 Vanc 6/19 >> Cefepime 6/19 >> 6/21  Levels/dose changes this admission: 6/21 VT at 2300 = 31 on 1g q12 - dose d/c'd and recheck another level at 1000 6/22 6/22 Vanc Trough at 1000 = 19 - Reduce Vanc to 1g q24 6/25 VT at 1100 = 9 mcg/ml on 1gm q24  Microbiology results: 6/14 BCx: ngtd 6/14 UCx: NGF  6/14 strep pneumo/leg ur ag: neg/IP 6/15 trach asp: ng-final 6/14 MRSA PCR: + 6/14 HIV ab: NR 6/18 Trach asp: MRSA 6/19 Legionella: neg 6/25 BCx: sent 6/25 UCx: sent 6/25 Trach asp: sent  1320 Addendum: trough low on 1gm q24,  Increase Vancomycin to 1500mg  q24, begin today at 1400  Thank you for allowing pharmacy to be a part of this patient's care.  Minda Ditto PharmD Pager [phone] 09/19/2015, 9:35 AM

## 2015-09-19 NOTE — Progress Notes (Addendum)
PULMONARY / CRITICAL CARE MEDICINE   Name: Rebecca Richards MRN: [id number] DOB: 05-Apr-1945    ADMISSION DATE:  09/15/2015 CONSULTATION DATE:  08/28/2015  REFERRING MD:  Kathie Dike, M.D. / AP [facility]  CHIEF COMPLAINT:  Dyspnea  BRIEF:   70 y/o female with DM2 with ARDS and CAP initially admitted to [facility] on 6/14.  Developed MRSA pneumonia 6/18.  SUBJECTIVE:  No weaning due to vent asynchrony >> increased sedation>> hypotensive Febrile No obvious pain Belly distended  VITAL SIGNS: BP 121/76 mmHg  Pulse 71  Temp(Src) 102.1 F (38.9 C) (Oral)  Resp 26  Ht 5\' 1"  (1.549 m)  Wt 163 lb 5.8 oz (74.1 kg)  BMI 30.88 kg/m2  SpO2 100%  HEMODYNAMICS: CVP:  [9 mmHg-14 mmHg] 10 mmHg  VENTILATOR SETTINGS: Vent Mode:  [-] PRVC FiO2 (%):  [30 %-40 %] 40 % Set Rate:  [14 bmp] 14 bmp Vt Set:  [450 mL] 450 mL PEEP:  [5 cmH20-10 cmH20] 10 cmH20 Plateau Pressure:  [22 cmH20-28 cmH20] 26 cmH20  INTAKE / OUTPUT:  Intake/Output Summary (Last 24 hours) at 09/19/15 1004 Last data filed at 09/19/15 0942  Gross per 24 hour  Intake 1827.43 ml  Output   1895 ml  Net -67.57 ml   PHYSICAL EXAMINATION: General: Sedated, intubated , chr ill Neuro: RASS -2 HEENT: ETT in place Cardiac: regular Chest: b/l crackles Abd: soft, distended,nontender Ext: no edema Skin: no rashes  LABS:  BMET  Recent Labs Lab 09/17/15 0448 09/18/15 0505 09/19/15 0245  NA 145 144 143  K 4.6 4.0 3.5  CL 103 103 103  CO2 33* 33* 32  BUN 56* 49* 47*  CREATININE 0.94 0.79 0.80  GLUCOSE 124* 128* 138*    Electrolytes  Recent Labs Lab 09/15/15 0500 09/16/15 0355 09/17/15 0448 09/18/15 0505 09/19/15 0245  CALCIUM 8.4* 8.7*  8.7* 8.9 8.7* 8.7*  MG 2.3 2.4 2.6* 2.6* 2.4  PHOS 4.0 4.3  --   --  3.9    CBC  Recent Labs Lab 09/17/15 0448 09/18/15 0505 09/19/15 0245  WBC 17.5* 18.0* 18.6*  HGB 8.4* 7.9* 8.1*  HCT 27.4* 25.5* 25.9*  PLT 330 356 438*    Coag's No results for  input(s): APTT, INR in the last 168 hours.  Sepsis Markers  Recent Labs Lab 09/13/15 0504 09/14/15 0500  PROCALCITON 0.39 0.39    ABG  Recent Labs Lab 09/13/15 0540 09/13/15 0947 09/18/15 2006  PHART 7.413 7.376 7.385  PCO2ART 42.2 47.0* 56.0*  PO2ART 52.3* 57.8* 99.2    Liver Enzymes  Recent Labs Lab 09/15/15 0500 09/16/15 0355 09/19/15 0245  ALBUMIN 2.3* 2.3* 2.2*    Cardiac Enzymes No results for input(s): TROPONINI, PROBNP in the last 168 hours.  Glucose  Recent Labs Lab 09/18/15 1215 09/18/15 1632 09/18/15 2047 09/18/15 2324 09/19/15 0418 09/19/15 0819  GLUCAP 128* 160* 154* 128* 125* 147*    Imaging Dg Chest Port 1 View  09/18/2015  CLINICAL DATA:  70 year old female with a history of hypoxia EXAM: PORTABLE CHEST 1 VIEW COMPARISON:  09/18/2015, 09/17/2015 FINDINGS: Cardiomediastinal silhouette unchanged. Endotracheal tube terminates 3.5 cm above the carina. Unchanged gastric tube, terminating out of the field of view. Electrodes project over the mid thoracic spine, unchanged. Aeration the bilateral lungs has improved, although there is mixed interstitial and airspace opacity persisting at the bilateral lung bases. Blunting of the bilateral costophrenic angles. No pneumothorax. IMPRESSION: Improving aeration bilaterally with persisting mixed interstitial and airspace disease, predominantly  at the lung bases. Unchanged endotracheal tube and gastric tube. Signed, Dulcy Fanny. Earleen Newport, DO [facility] Electronically Signed   By: Corrie Mckusick D.O.   On: 09/18/2015 19:05   Dg Chest Port 1 View  09/18/2015  CLINICAL DATA:  ARDS and MRSA pneumonia. EXAM: PORTABLE CHEST 1 VIEW COMPARISON:  09/17/2015 FINDINGS: Endotracheal tube remains with the tip approximately 4 cm above the carina. Stable PICC line on right with catheter tip in the SVC. Nasogastric tube extends into the stomach. There is interval increased in  pulmonary interstitial and vascular prominence throughout both lungs likely reflecting interstitial edema. No significant pleural effusions. The heart size is normal. Stable appearance of a thoracic spinal stimulator. IMPRESSION: Diffusely increased pulmonary interstitial and vascular prominence likely reflects interstitial edema. Electronically Signed   By: Aletta Edouard M.D.   On: 09/18/2015 09:45    STUDIES:  TTE 6/15 >> Moderate LVH with EF 70-75%. Normal regional wall motion. Pulmonary artery systolic pressure 46 mmHg. RV normal in size.  MICROBIOLOGY: MRSA PCR 6/14:  Positive Blood Ctx x2 6/14 >> ng Urine Ctx 6/14:  Negative  Urine Strep Ag 6/14:  Negative Urine Legionella Ag 6/14: Negative Tracheal Asp Ctx 6/15:  Negative Tracheal Asp Ctx 6/18>> MRSA  ANTIBIOTICS: Rocephin 6/14 >> 6/19 Azithromycin 6/14 >> 6/19  Cefepime 6/19 > 6/21 Vancomycin 6/19 >   SIGNIFICANT EVENTS: 6/14 - Admission to [facility], intubation, transfer to [facility] 6/23 Tolerated pressure support 15/5   LINES/TUBES: ETT 6/14>> RUE TL PICC 6/17>>  ASSESSMENT / PLAN:  PULMONARY A: Acute hypoxic respiratory failure from MRSA PNA, ARDS. Hx of OSA. P:   Pressure support wean as tolerated F/u CXR Prn BDs  CARDIOVASCULAR A:  Severe sepsis from PNA. Hx of HTN. P:  Neo gtt  RENAL A:   Hypernatremia - resolved P:   Monitor renal fx, urine outpt, electrolytes  GASTROINTESTINAL A:   Nutrition. Hx of Hepatitis C. Concern for ileus P:   dcTube feeds - OG to LIS Protonix for SUP XR abd - lactulose x 1  HEMATOLOGIC/ONCOLOGIC A:   Anemia of critical illness and chronic disease. Hx of Rt renal cancer. P:  F/u CBC Heparin [place]   INFECTIOUS A:   Severe sepsis 2nd to MRSA PNA. Fever P:   Plan to continue vancomycin 7-10ds  ENDOCRINE A:   Hx DM Type 2 - BG controlled. Hx Pituitary Tumor - S/P Resection. P:   SSI with levemir  NEUROLOGIC A:   Acute metabolic  encephalopathy. Hx of anxiety, depression. Hx of chronic pain s/p spinal cord stimulator. P:   RASS goal: -1 Lyrica bid VT Dc propofol -use precedex instead to enable wean, minimise fent  Since no progress over past few days & large tongue , may have to plan for trach  CC time 35 minutes.   Kara Mead MD. Shade Flood. [place] [facility] Pager [phone] If no response call [phone]    09/19/2015, 10:04 AM

## 2015-09-20 ENCOUNTER — Inpatient Hospital Stay (HOSPITAL_COMMUNITY): Payer: Medicare HMO

## 2015-09-20 LAB — GLUCOSE, CAPILLARY
GLUCOSE-CAPILLARY: 111 mg/dL — AB (ref 65–99)
GLUCOSE-CAPILLARY: 115 mg/dL — AB (ref 65–99)
GLUCOSE-CAPILLARY: 185 mg/dL — AB (ref 65–99)
GLUCOSE-CAPILLARY: 95 mg/dL (ref 65–99)
Glucose-Capillary: 161 mg/dL — ABNORMAL HIGH (ref 65–99)
Glucose-Capillary: 118 mg/dL — ABNORMAL HIGH (ref 65–99)
Glucose-Capillary: 128 mg/dL — ABNORMAL HIGH (ref 65–99)

## 2015-09-20 LAB — URINE CULTURE: Culture: 20000 — AB

## 2015-09-20 LAB — RENAL FUNCTION PANEL
Albumin: 2 g/dL — ABNORMAL LOW (ref 3.5–5.0)
Anion gap: 8 (ref 5–15)
BUN: 32 mg/dL — ABNORMAL HIGH (ref 6–20)
CO2: 29 mmol/L (ref 22–32)
Calcium: 8.4 mg/dL — ABNORMAL LOW (ref 8.9–10.3)
Chloride: 107 mmol/L (ref 101–111)
Creatinine, Ser: 0.81 mg/dL (ref 0.44–1.00)
GFR calc Af Amer: >60 mL/min
GFR calc non Af Amer: >60 mL/min
Glucose, Bld: 110 mg/dL — ABNORMAL HIGH (ref 65–99)
Phosphorus: 3 mg/dL (ref 2.5–4.6)
Potassium: 3.5 mmol/L (ref 3.5–5.1)
Sodium: 144 mmol/L (ref 135–145)

## 2015-09-20 LAB — CBC WITH DIFFERENTIAL/PLATELET
Basophils Absolute: 0 K/uL (ref 0.0–0.1)
Basophils Relative: 0 %
Eosinophils Absolute: 0.1 K/uL (ref 0.0–0.7)
Eosinophils Relative: 1 %
HCT: 24.4 % — ABNORMAL LOW (ref 36.0–46.0)
Hemoglobin: 7.6 g/dL — ABNORMAL LOW (ref 12.0–15.0)
Lymphocytes Relative: 10 %
Lymphs Abs: 1.6 K/uL (ref 0.7–4.0)
MCH: 27.2 pg (ref 26.0–34.0)
MCHC: 31.1 g/dL (ref 30.0–36.0)
MCV: 87.5 fL (ref 78.0–100.0)
Monocytes Absolute: 1.1 K/uL — ABNORMAL HIGH (ref 0.1–1.0)
Monocytes Relative: 7 %
Neutro Abs: 13.5 K/uL — ABNORMAL HIGH (ref 1.7–7.7)
Neutrophils Relative %: 82 %
Platelets: 491 K/uL — ABNORMAL HIGH (ref 150–400)
RBC: 2.79 MIL/uL — ABNORMAL LOW (ref 3.87–5.11)
RDW: 16.2 % — ABNORMAL HIGH (ref 11.5–15.5)
WBC: 16.4 K/uL — ABNORMAL HIGH (ref 4.0–10.5)

## 2015-09-20 LAB — MAGNESIUM: Magnesium: 2.3 mg/dL (ref 1.7–2.4)

## 2015-09-20 NOTE — Progress Notes (Signed)
Nutrition Follow-up  DOCUMENTATION CODES:   Obesity unspecified  INTERVENTION:  - Will monitor for ability to re-start TF. - RD will follow-up 6/27.  NUTRITION DIAGNOSIS:   Inadequate oral intake related to inability to eat as evidenced by NPO status. -ongoing  GOAL:   Provide needs based on ASPEN/SCCM guidelines -unable to meet with TF currently on hold.  MONITOR:   Vent status, Weight trends, Labs, I & O's  ASSESSMENT:   70 y.o. female with medical history significant of HTN, tobacco abuse, DM type 2, OSA on CPAP, presented to the ED via ambulance with complaints of shortness of breath for the last 3 days which significantly worsened last night. She reports an associated cough with production of white sputum as well as chest soreness with deep inhalation, wheeze, and slight fever. She denies any pain while coughing.  6/26 No family/visitors at bedside. Pt continues with OGT. TF on hold with OGT to suction since yesterday with orders at that time for Protonix and 1 dose Lactulose. Repeat XCR for abdominal distention completed at bedside shortly before RD visit; will monitor for results. Weight up 2 kg since admission (165 lbs versus 163 lbs); will continue to use admission weight to calculate nutrition needs and will monitor for need for adjustment throughout admission.   Patient is currently intubated on ventilator support MV: 9.4 L/min Temp (24hrs), Avg:101.8 F (38.8 C), Min:99.5 F (37.5 C), Max:103.2 F (39.6 C) Propofol: none, switched from Propofol to Precedex for sedation (per Dr. Bari Mantis note yesterday: to enable weaning).  Per Dr. Matilde Bash note today at 1040: plan to discuss possible trach with family. RD will follow-up tomorrow for POC and continue to monitor for ability to re-start TF.  Medications reviewed; sliding scale Novolog, 10 units Levemir/day, liquid daily multivitamin, 40 mg Protonix/day, PRN Senokot. Labs reviewed; CBGs: 111 and 185 mg/dL today, BUN: 32  mg/dL, Ca: 8.4 mg/dL.  Drips: Neo @ 13 mcg/min, Fentanyl @ 75 mcg/hr, Precedex @ 0.5 mcg/kg/hr.   6/23 - Pt continues with OGT and no family/visitors in the room.  - Pt currently receiving Vital High Protein @ 20 mL/hr with 60 mL Prostat BID. This regimen is providing 880 kcal, 102 grams of protein, and 401 mL free water.  - Weight now 2 lbs lower than admission weight on 09/04/2015 (161 lbs versus 163 lbs) - Nutrition needs to remain the same at this time.  - Patient is currently intubated on ventilator support; MV: 9.8 L/min; Temp (24hrs), Max:101.4 F (38.6 C); Propofol: 9.5 ml/hr (259 kcal) - Will add daily multivitamin given pt not meeting micronutrient needs with ongoing low TF rate.   Drips: Fentanyl @ 125 mcg/hr, Propofol @ 20 mcg/kg/min.   6/22 - Pt currently receiving Vital High Protein @ 20 mL/hr with 60 mL Prostat BID with 200 mL free water every 6 hours which is providing 880 kcal, 102 grams of protein, and 1201 mL free water.  - OGT remains in place. - RN states that vent wean attempted x6 hours yesterday but that pt became agitated; also reports agitation with advancement of ETT. - Patient is currently intubated on ventilator support; MV: 14.4 L/min; Temp (24hrs), Max:101.5 F (38.6 C); Propofol: 9.5 ml/hr (251 kcal)   *SEE CHART FOR RD NOTES EARLIER IN ADMISSION.    Diet Order:  Diet NPO time specified  Skin:  Reviewed, no issues  Last BM:  6/19  Height:   Ht Readings from Last 1 Encounters:  09/05/2015 5\' 1"  (1.549 m)  Weight:   Wt Readings from Last 1 Encounters:  09/20/15 165 lb 12.6 oz (75.2 kg)    Ideal Body Weight:  47.73 kg (kg)  BMI:  Body mass index is 31.34 kg/(m^2).  Estimated Nutritional Needs:   Kcal:  [id number] (11-14 kcal/kg actual body weight)  Protein:  95 grams (2 grams/kg IBW)  Fluid:  > 1L/day  EDUCATION NEEDS:   No education needs identified at this time     Jarome Matin, MS, RD, LDN Inpatient Clinical  Dietitian Pager # [phone] After hours/weekend pager # [phone]

## 2015-09-20 NOTE — Progress Notes (Signed)
Urine output has been marginal ~60-70 mL q2h.  MD made aware.    Will continue to monitor.

## 2015-09-20 NOTE — Progress Notes (Signed)
PULMONARY / CRITICAL CARE MEDICINE   Name: Rebecca Richards MRN: [id number] DOB: 03-11-1946    ADMISSION DATE:  08/26/2015 CONSULTATION DATE:  09/21/2015  REFERRING MD:  Kathie Dike, M.D. / [facility]  CHIEF COMPLAINT:  Dyspnea  BRIEF:   70 y/o female with DM2 with ARDS and CAP initially admitted to [facility] on 6/14.  Developed MRSA pneumonia 6/18.  SUBJECTIVE:  Continues to be on pressors. No progress in weaning vent.  VITAL SIGNS: BP 110/59 mmHg  Pulse 63  Temp(Src) 102.8 F (39.3 C) (Oral)  Resp 21  Ht 5\' 1"  (1.549 m)  Wt 165 lb 12.6 oz (75.2 kg)  BMI 31.34 kg/m2  SpO2 100%  HEMODYNAMICS: CVP:  [6 mmHg-23 mmHg] 10 mmHg  VENTILATOR SETTINGS: Vent Mode:  [-] PRVC FiO2 (%):  [40 %] 40 % Set Rate:  [14 bmp] 14 bmp Vt Set:  [450 mL] 450 mL PEEP:  [6 cmH20-10 cmH20] 6 cmH20 Plateau Pressure:  [14 cmH20-28 cmH20] 14 cmH20  INTAKE / OUTPUT:  Intake/Output Summary (Last 24 hours) at 09/20/15 1040 Last data filed at 09/20/15 1000  Gross per 24 hour  Intake 1796.45 ml  Output   1395 ml  Net 401.45 ml   PHYSICAL EXAMINATION: General: Sedated, intubated , Chronically ill Neuro: Unresponsive HEENT: ETT in place Cardiac: RRR, No MRG Chest: Clear, no wheeze, crackles Abd: soft, NT, diminshed bs Ext: no edema Skin: no rashes  LABS:  BMET  Recent Labs Lab 09/18/15 0505 09/19/15 0245 09/19/15 0450  NA 144 143 144  K 4.0 3.5 3.5  CL 103 103 107  CO2 33* 32 29  BUN 49* 47* 32*  CREATININE 0.79 0.80 0.81  GLUCOSE 128* 138* 110*    Electrolytes  Recent Labs Lab 09/16/15 0355  09/18/15 0505 09/19/15 0245 09/19/15 0450 09/20/15 0450  CALCIUM 8.7*  8.7*  < > 8.7* 8.7* 8.4*  --   MG 2.4  < > 2.6* 2.4  --  2.3  PHOS 4.3  --   --  3.9 3.0  --   < > = values in this interval not displayed.  CBC  Recent Labs Lab 09/18/15 0505 09/19/15 0245 09/20/15 0450  WBC 18.0* 18.6* 16.4*  HGB 7.9* 8.1* 7.6*  HCT 25.5* 25.9* 24.4*  PLT 356 438*  491*    Coag's No results for input(s): APTT, INR in the last 168 hours.  Sepsis Markers  Recent Labs Lab 09/14/15 0500  PROCALCITON 0.39    ABG  Recent Labs Lab 09/18/15 2006  PHART 7.385  PCO2ART 56.0*  PO2ART 99.2    Liver Enzymes  Recent Labs Lab 09/16/15 0355 09/19/15 0245 09/19/15 0450  ALBUMIN 2.3* 2.2* 2.0*    Cardiac Enzymes No results for input(s): TROPONINI, PROBNP in the last 168 hours.  Glucose  Recent Labs Lab 09/19/15 0819 09/19/15 1215 09/19/15 1549 09/19/15 2105 09/20/15 0049 09/20/15 0429  GLUCAP 147* 165* 155* 161* 185* 111*    Imaging Dg Abd 1 View  09/19/2015  CLINICAL DATA:  Ileus, abdominal distension for 1 day EXAM: ABDOMEN - 1 VIEW COMPARISON:  09/14/2015 FINDINGS: There is normal small bowel gas pattern. NG tube with tip in proximal duodenum. There is significant gaseous distension of the right colon and transverse colon. Proximal transverse colon measures 9 cm in diameter. Although findings may be due to ileus early colonic obstruction cannot be excluded. IMPRESSION: Normal small bowel gas pattern. NG tube in place. Significant gaseous distension of the right colon and  transverse colon. Colonic ileus or early obstruction cannot be excluded. Electronically Signed   By: Lahoma Crocker M.D.   On: 09/19/2015 11:03   Dg Chest Port 1 View  09/20/2015  CLINICAL DATA:  70 year old female with diabetes, hypertension, tobacco abuse, renal cell cancer presenting with pneumonia. Subsequent encounter. EXAM: PORTABLE CHEST 1 VIEW COMPARISON:  09/18/2015 chest x-ray. FINDINGS: Endotracheal tube tip 2.2 cm above the carina. Nasogastric tube courses below the diaphragm. Tip is not included on the present exam. Spine stimulator device in place lower thoracic spine. Right central line is in place with the tip at the level of the proximal superior vena cava. No gross pneumothorax. Minimal change in diffuse asymmetric airspace disease which may represent  infectious infiltrate and/ or pulmonary edema superimposed upon chronic changes. Cardiomegaly. Calcified aorta. IMPRESSION: Minimal change in diffuse asymmetric airspace disease. This may represent infectious infiltrate and/ or pulmonary edema superimposed upon chronic changes. Cardiomegaly. Aortic atherosclerosis. Electronically Signed   By: Genia Del M.D.   On: 09/20/2015 07:16   Dg Chest Port 1 View  09/18/2015  CLINICAL DATA:  70 year old female with a history of hypoxia EXAM: PORTABLE CHEST 1 VIEW COMPARISON:  09/18/2015, 09/17/2015 FINDINGS: Cardiomediastinal silhouette unchanged. Endotracheal tube terminates 3.5 cm above the carina. Unchanged gastric tube, terminating out of the field of view. Electrodes project over the mid thoracic spine, unchanged. Aeration the bilateral lungs has improved, although there is mixed interstitial and airspace opacity persisting at the bilateral lung bases. Blunting of the bilateral costophrenic angles. No pneumothorax. IMPRESSION: Improving aeration bilaterally with persisting mixed interstitial and airspace disease, predominantly at the lung bases. Unchanged endotracheal tube and gastric tube. Signed, Dulcy Fanny. Earleen Newport, DO [facility] Rad[facility] Electronically Signed   By: Corrie Mckusick D.O.   On: 09/18/2015 19:05    STUDIES:  TTE 6/15 >> Moderate LVH with EF 70-75%. Normal regional wall motion. Pulmonary artery systolic pressure 46 mmHg. RV normal in size.  MICROBIOLOGY: MRSA PCR 6/14:  Positive Blood Ctx x2 6/14 >> ng Urine Ctx 6/14:  Negative  Urine Strep Ag 6/14:  Negative Urine Legionella Ag 6/14: Negative Tracheal Asp Ctx 6/15:  Negative Tracheal Asp Ctx 6/18>> MRSA  ANTIBIOTICS: Rocephin 6/14 >> 6/19 Azithromycin 6/14 >> 6/19  Cefepime 6/19 > 6/21 Vancomycin 6/19 >   SIGNIFICANT EVENTS: 6/14 - Admission to [facility], intubation, transfer to [facility] 6/23 Tolerated pressure support  15/5   LINES/TUBES: ETT 6/14>> RUE TL PICC 6/17>>  ASSESSMENT / PLAN:  PULMONARY A: Acute hypoxic respiratory failure from MRSA PNA, ARDS. Hx of OSA. P:   Pressure support wean as tolerated  F/u CXR Will disucss with family about trach. Prn BDs  CARDIOVASCULAR A:  Severe sepsis from PNA. Hx of HTN. P:  Neo gtt. Wean to off as tolerated  RENAL A:   Hypernatremia - resolved P:   Monitor renal fx, urine outpt, electrolytes  GASTROINTESTINAL A:   Nutrition. Hx of Hepatitis C. Concern for ileus P:   Off tube feeds.  NG tube to LIS Repeat abd x ray. Protonix for SUP  HEMATOLOGIC/ONCOLOGIC A:   Anemia of critical illness and chronic disease. Hx of Rt renal cancer. P:  F/u CBC Heparin [place]   INFECTIOUS A:   Severe sepsis 2nd to MRSA PNA. Fever P:   Plan to continue vancomycin for total of 7-10ds  ENDOCRINE A:   Hx DM Type 2 - BG controlled. Hx Pituitary Tumor - S/P Resection. P:  SSI with levemir  NEUROLOGIC A:   Acute metabolic encephalopathy. Hx of anxiety, depression. Hx of chronic pain s/p spinal cord stimulator. P:   RASS goal: -1 Lyrica bid VT   Critical care time- 35 mins.  Marshell Garfinkel MD  Pulmonary and Critical Care Pager [phone] If no answer or after 3pm call: [phone] 09/20/2015, 10:40 AM

## 2015-09-21 ENCOUNTER — Inpatient Hospital Stay (HOSPITAL_COMMUNITY): Payer: Medicare HMO

## 2015-09-21 DIAGNOSIS — N179 Acute kidney failure, unspecified: Secondary | ICD-10-CM | POA: Diagnosis present

## 2015-09-21 DIAGNOSIS — J9601 Acute respiratory failure with hypoxia: Secondary | ICD-10-CM | POA: Diagnosis present

## 2015-09-21 LAB — CULTURE, RESPIRATORY

## 2015-09-21 LAB — GLUCOSE, CAPILLARY
GLUCOSE-CAPILLARY: 80 mg/dL (ref 65–99)
GLUCOSE-CAPILLARY: 159 mg/dL — AB (ref 65–99)
Glucose-Capillary: 116 mg/dL — ABNORMAL HIGH (ref 65–99)
Glucose-Capillary: 90 mg/dL (ref 65–99)
Glucose-Capillary: 150 mg/dL — ABNORMAL HIGH (ref 65–99)

## 2015-09-21 LAB — BASIC METABOLIC PANEL
Anion gap: 7 (ref 5–15)
BUN: 30 mg/dL — AB (ref 6–20)
CO2: 27 mmol/L (ref 22–32)
Calcium: 8.1 mg/dL — ABNORMAL LOW (ref 8.9–10.3)
Chloride: 108 mmol/L (ref 101–111)
Creatinine, Ser: 0.65 mg/dL (ref 0.44–1.00)
GFR calc Af Amer: >60 mL/min (ref >60)
GLUCOSE: 142 mg/dL — AB (ref 65–99)
POTASSIUM: 3.6 mmol/L (ref 3.5–5.1)
Sodium: 142 mmol/L (ref 135–145)

## 2015-09-21 LAB — PHOSPHORUS: Phosphorus: 3.9 mg/dL (ref 2.5–4.6)

## 2015-09-21 LAB — CBC
HCT: 22.2 % — ABNORMAL LOW (ref 36.0–46.0)
Hemoglobin: 7 g/dL — ABNORMAL LOW (ref 12.0–15.0)
MCH: 27.2 pg (ref 26.0–34.0)
MCHC: 31.5 g/dL (ref 30.0–36.0)
MCV: 86.4 fL (ref 78.0–100.0)
PLATELETS: 495 10*3/uL — AB (ref 150–400)
RBC: 2.57 MIL/uL — AB (ref 3.87–5.11)
RDW: 16.3 % — ABNORMAL HIGH (ref 11.5–15.5)
WBC: 14.3 10*3/uL — ABNORMAL HIGH (ref 4.0–10.5)

## 2015-09-21 LAB — CULTURE, RESPIRATORY W GRAM STAIN

## 2015-09-21 LAB — PROTIME-INR
INR: 1.29 (ref 0.00–1.49)
PROTHROMBIN TIME: 15.7 s — AB (ref 11.6–15.2)

## 2015-09-21 LAB — MAGNESIUM: Magnesium: 2.4 mg/dL (ref 1.7–2.4)

## 2015-09-21 MED ORDER — DEXTROSE-NACL 5-0.45 % IV SOLN
INTRAVENOUS | Status: DC
  Administered 2015-09-21: 11:00:00 via INTRAVENOUS

## 2015-09-21 MED ORDER — FENTANYL CITRATE (PF) 100 MCG/2ML IJ SOLN
200.0000 ug | Freq: Once | INTRAMUSCULAR | Status: AC
  Administered 2015-09-21: 200 ug via INTRAVENOUS
  Filled 2015-09-21: qty 4

## 2015-09-21 MED ORDER — VECURONIUM BROMIDE 10 MG IV SOLR
10.0000 mg | Freq: Once | INTRAVENOUS | Status: AC
  Administered 2015-09-21: 5 mg via INTRAVENOUS
  Filled 2015-09-21: qty 10

## 2015-09-21 MED ORDER — MIDAZOLAM HCL 2 MG/2ML IJ SOLN
4.0000 mg | Freq: Once | INTRAMUSCULAR | Status: AC
  Administered 2015-09-21: 2 mg via INTRAVENOUS
  Filled 2015-09-21: qty 4

## 2015-09-21 MED ORDER — ALTEPLASE 2 MG IJ SOLR
2.0000 mg | Freq: Once | INTRAMUSCULAR | Status: AC
  Administered 2015-09-21: 2 mg
  Filled 2015-09-21: qty 2

## 2015-09-21 MED ORDER — PROPOFOL 1000 MG/100ML IV EMUL
5.0000 ug/kg/min | INTRAVENOUS | Status: DC
  Administered 2015-09-21: 10 ug/kg/min via INTRAVENOUS
  Filled 2015-09-21: qty 100

## 2015-09-21 MED ORDER — ETOMIDATE 2 MG/ML IV SOLN
40.0000 mg | Freq: Once | INTRAVENOUS | Status: AC
  Administered 2015-09-21: 20 mg via INTRAVENOUS
  Filled 2015-09-21: qty 20

## 2015-09-21 NOTE — Progress Notes (Signed)
PULMONARY / CRITICAL CARE MEDICINE   Name: Rebecca Richards MRN: [id number] DOB: [birth year]    ADMISSION DATE:  09/16/2015 CONSULTATION DATE:  09/22/2015  REFERRING MD:  Kathie Dike, M.D. / [facility]  CHIEF COMPLAINT:  Dyspnea  BRIEF:   70 y/o female with DM2 with ARDS and CAP initially admitted to [facility] on 6/14.  Developed MRSA pneumonia 6/18.  SUBJECTIVE:  Remains on 10 mcg neo, precedex 0.3, fentanyl at 50 mcg.  No acute events overnight.  RN reports pt followed commands on WUA, alert but re-sedated due to tachypnea / agitation  VITAL SIGNS: BP 95/56 mmHg  Pulse 59  Temp(Src) 98.8 F (37.1 C) (Axillary)  Resp 19  Ht 5\' 1"  (1.549 m)  Wt 162 lb 11.2 oz (73.8 kg)  BMI 30.76 kg/m2  SpO2 100%  HEMODYNAMICS: CVP:  [8 mmHg-17 mmHg] 8 mmHg  VENTILATOR SETTINGS: Vent Mode:  [-] PSV;CPAP FiO2 (%):  [40 %] 40 % Set Rate:  [14 bmp] 14 bmp Vt Set:  [450 mL] 450 mL PEEP:  [5 cmH20-6 cmH20] 5 cmH20 Pressure Support:  [10 cmH20] 10 cmH20 Plateau Pressure:  [26 cmH20-28 cmH20] 26 cmH20  INTAKE / OUTPUT:  Intake/Output Summary (Last 24 hours) at 09/21/15 0918 Last data filed at 09/21/15 0700  Gross per 24 hour  Intake 1319.52 ml  Output    975 ml  Net 344.52 ml   PHYSICAL EXAMINATION: General: chronically ill appearing female in NAD on vent Neuro: opens eyes to voice, chewing on tongue, moves ext's spontaneously HEENT: ETT in place, tongue edema / crusting Cardiac: RRR, No MRG Chest: Clear, no wheeze, crackles Abd: soft, NT, diminshed bs Ext: no edema Skin: no rashes  LABS:  BMET  Recent Labs Lab 09/19/15 0245 09/19/15 0450 09/21/15 0310  NA 143 144 142  K 3.5 3.5 3.6  CL 103 107 108  CO2 32 29 27  BUN 47* 32* 30*  CREATININE 0.80 0.81 0.65  GLUCOSE 138* 110* 142*    Electrolytes  Recent Labs Lab 09/19/15 0245 09/19/15 0450 09/20/15 0450 09/21/15 0310  CALCIUM 8.7* 8.4*  --  8.1*  MG 2.4  --  2.3 2.4  PHOS 3.9 3.0  --  3.9     CBC  Recent Labs Lab 09/19/15 0245 09/20/15 0450 09/21/15 0310  WBC 18.6* 16.4* 14.3*  HGB 8.1* 7.6* 7.0*  HCT 25.9* 24.4* 22.2*  PLT 438* 491* 495*    Coag's No results for input(s): APTT, INR in the last 168 hours.  Sepsis Markers No results for input(s): LATICACIDVEN, PROCALCITON, O2SATVEN in the last 168 hours.  ABG  Recent Labs Lab 09/18/15 2006  PHART 7.385  PCO2ART 56.0*  PO2ART 99.2    Liver Enzymes  Recent Labs Lab 09/16/15 0355 09/19/15 0245 09/19/15 0450  ALBUMIN 2.3* 2.2* 2.0*    Cardiac Enzymes No results for input(s): TROPONINI, PROBNP in the last 168 hours.  Glucose  Recent Labs Lab 09/20/15 1147 09/20/15 1549 09/20/15 2047 09/20/15 2327 09/21/15 0424 09/21/15 0750  GLUCAP 118* 115* 128* 95 90 80    Imaging Dg Abd 1 View  09/20/2015  CLINICAL DATA:  Abdominal distension. EXAM: ABDOMEN - 1 VIEW COMPARISON:  Radiograph of September 19, 2015. FINDINGS: Postsurgical changes are noted in lower lumbar spine. Nasogastric tube tip is seen in the stomach. Status post cholecystectomy. Stable air-filled right and transverse colon is noted, but no significant small bowel dilatation is noted. Phleboliths are noted in the pelvis. IMPRESSION: Stable air-filled right  and transverse colonic distention is noted concerning for ileus or less likely distal obstruction. Electronically Signed   By: Marijo Conception, M.D.   On: 09/20/2015 12:38   Dg Abd 1 View  09/19/2015  CLINICAL DATA:  Ileus, abdominal distension for 1 day EXAM: ABDOMEN - 1 VIEW COMPARISON:  09/14/2015 FINDINGS: There is normal small bowel gas pattern. NG tube with tip in proximal duodenum. There is significant gaseous distension of the right colon and transverse colon. Proximal transverse colon measures 9 cm in diameter. Although findings may be due to ileus early colonic obstruction cannot be excluded. IMPRESSION: Normal small bowel gas pattern. NG tube in place. Significant gaseous distension  of the right colon and transverse colon. Colonic ileus or early obstruction cannot be excluded. Electronically Signed   By: Lahoma Crocker M.D.   On: 09/19/2015 11:03   Dg Chest Port 1 View  09/21/2015  CLINICAL DATA:  Respiratory failure. EXAM: PORTABLE CHEST 1 VIEW COMPARISON:  09/20/2015, 09/16/2015, 08/30/2015, 08/18/2014. FINDINGS: Endotracheal tube, NG tube, right PICC line stable position . Stable cardiomegaly. Diffuse bilateral unchanged interstitial infiltrates are again noted. Tiny pleural effusions cannot be excluded. No pneumothorax. IMPRESSION: 1. Lines and tubes in stable position. 2. No interim change in diffuse bilateral pulmonary interstitial prominence. Findings consistent with active interstitial disease/ edema. Tiny bilateral pleural effusions cannot be excluded. 3. Stable cardiomegaly. Electronically Signed   By: Marcello Moores  Register   On: 09/21/2015 07:16   Dg Chest Port 1 View  09/20/2015  CLINICAL DATA:  70 year old female with diabetes, hypertension, tobacco abuse, renal cell cancer presenting with pneumonia. Subsequent encounter. EXAM: PORTABLE CHEST 1 VIEW COMPARISON:  09/18/2015 chest x-ray. FINDINGS: Endotracheal tube tip 2.2 cm above the carina. Nasogastric tube courses below the diaphragm. Tip is not included on the present exam. Spine stimulator device in place lower thoracic spine. Right central line is in place with the tip at the level of the proximal superior vena cava. No gross pneumothorax. Minimal change in diffuse asymmetric airspace disease which may represent infectious infiltrate and/ or pulmonary edema superimposed upon chronic changes. Cardiomegaly. Calcified aorta. IMPRESSION: Minimal change in diffuse asymmetric airspace disease. This may represent infectious infiltrate and/ or pulmonary edema superimposed upon chronic changes. Cardiomegaly. Aortic atherosclerosis. Electronically Signed   By: Genia Del M.D.   On: 09/20/2015 07:16    STUDIES:  TTE 6/15 >> Moderate  LVH with EF 70-75%. Normal regional wall motion. Pulmonary artery systolic pressure 46 mmHg. RV normal in size.  MICROBIOLOGY: MRSA PCR 6/14:  Positive Blood Ctx x2 6/14 >> ng Urine Ctx 6/14:  Negative  Urine Strep Ag 6/14:  Negative Urine Legionella Ag 6/14: Negative Tracheal Asp Ctx 6/15:  Negative Tracheal Asp Ctx 6/18>> MRSA  ANTIBIOTICS: Rocephin 6/14 >> 6/19 Azithromycin 6/14 >> 6/19  Cefepime 6/19 > 6/21 Vancomycin 6/19 >   SIGNIFICANT EVENTS: 6/14  Admission to [facility], intubation, transfer to [facility] 6/23  Tolerated pressure support 15/5  6/26  Weaning on PSV  LINES/TUBES: ETT 6/14 >> RUE TL PICC 6/17 >>  ASSESSMENT / PLAN:  PULMONARY A: Acute hypoxic respiratory failure from MRSA PNA, ARDS. Hx of OSA. P:   PRVC 8 cc/kg  Wean PEEP / FiO2 for sats > 92% Pressure support wean as tolerated  F/u CXR May need trach, will need to discuss with family   PRN BDs  CARDIOVASCULAR A:  Severe sepsis from PNA. Hx of HTN. P:  Neo gtt for MAP >65 Tele monitoring  RENAL A:   Hypernatremia - resolved Decreased UOP - noted 6/27 am P:   Monitor renal fx, urine outpt, electrolytes D51/2NS @ 75 while NPO   GASTROINTESTINAL A:   Nutrition. Hx of Hepatitis C. Concern for ileus P:   Hold tube feeds.  NG tube to LIS for decompression Protonix for SUP  HEMATOLOGIC/ONCOLOGIC A:   Anemia of critical illness and chronic disease. Hx of Rt renal cancer. P:  F/u CBC Heparin [place]  Transfuse per ICU guidelines   INFECTIOUS A:   Severe sepsis 2nd to MRSA PNA. Fever P:   Plan to continue vancomycin for total of 7-10ds  ENDOCRINE A:   Hx DM Type 2 - BG controlled. Hx Pituitary Tumor - S/P Resection. P:   SSI with levemir D5/12NS while NPO  NEUROLOGIC A:   Acute metabolic encephalopathy. Hx of anxiety, depression. Hx of chronic pain s/p spinal cord stimulator. P:   RASS goal: -1 Lyrica bid VT  Noe Gens, NP-C [place] [facility]  [facility] Pgr: [phone] or if no answer [phone] 09/21/2015, 9:26 AM   Attending note: I have seen and examined the patient with nurse practitioner/resident and agree with the note. History, labs and imaging reviewed.  70 Y/O admitted with CAP, ARDS, developed MRSA HCAP. Not making progress with weaning. She has a thick tongue that will make extubation difficult. Discussed with family and will plan for trach. Continue vanco for total 10 days. Stop date 6/28  Critical care time- 35 mins. This represents my time independent of the NPs time taking care of the patient.  Marshell Garfinkel MD [place] [facility] Pager [phone] If no answer or after 3pm call: [phone] 09/21/2015, 10:31 AM

## 2015-09-21 NOTE — Procedures (Signed)
Bedside Tracheostomy Insertion Procedure Note   Patient Details:   Name: Rebecca Richards DOB: 1945-06-03 MRN: [id number]  Procedure: Tracheostomy  Pre Procedure Assessment: ET Tube Size:7.5 ET Tube secured at lip (cm):21 Bite block in place: No Breath Sounds: Diminished  Post Procedure Assessment: BP 133/66 mmHg  Pulse 94  Temp(Src) 100.6 F (38.1 C) (Oral)  Resp 23  Ht 5\' 1"  (1.549 m)  Wt 160 lb 4.4 oz (72.7 kg)  BMI 30.30 kg/m2  SpO2 100% O2 sats: stable throughout Complications: No apparent complications Patient did tolerate procedure well Tracheostomy Brand:Shiley Tracheostomy Style:Cuffed Tracheostomy Size: 6.0 cuffed Tracheostomy Secured [id number], velcro Tracheostomy Placement Confirmation:Trach cuff visualized and in place, cxr taken for placement    Brazos Sandoval Ann 09/21/2015, 4:51 PM

## 2015-09-21 NOTE — Progress Notes (Signed)
Called son Lorri Diprima) regarding tracheostomy placement.  Discussed reasons for need and updated on patients status.  Consent obtained via phone for tracheostomy.  Risks / benefits included in discussion - bleeding, infection, pneumothorax, anesthesia related risks etc.  Family indicates understanding.     Plan: Pre-trach order set placed, potential to be placed this PM NPO as of now   Noe Gens, NP-C [place] Pgr: [phone] or if no answer [phone] 09/21/2015, 10:19 AM

## 2015-09-21 NOTE — Procedures (Signed)
Name:  Rebecca Richards MRN:  [id number] DOB:  [birth year]  OPERATIVE NOTE  Procedure:  Percutaneous tracheostomy.  Indications:  Ventilator-dependent respiratory failure.  Consent:  Procedure, alternatives, risks and benefits discussed with medical POA.  Questions answered.  Consent obtained.  Anesthesia:  Etom, fent, vec  Procedure summary:  Appropriate equipment was assembled.  The patient was identified as [name] and safety timeout was performed. The patient was placed in supine position with a towel roll behind shoulder blades and neck extended.  Sterile technique was used. The patient's neck and upper chest were prepped using chlorhexidine / alcohol scrub and the field was draped in usual sterile fashion with full body drape. After the adequate sedation / anesthesia was achieved, attention was directed at the midline trachea, where the cricothyroid membrane was palpated. Approximately two fingerbreadths above the sternal notch, a verticle incision was created with a scalpel after local infiltration with 0.2% Lidocaine. Then, using Seldinger technique and a percutaneous tracheostomy set, the trachea was entered with a 14 gauge needle with an overlying sheath. This was all confirmed under direct visualization of a fiberoptic flexible bronchoscope. Entrance into the trachea was identified through the third tracheal ring interspace. Following this, a guidewire was inserted. The needle was removed, leaving the sheath and the guidewire intact. Next, the sheath was removed and a small dilator was inserted. The tracheal rings were then dilated. A #6 Shiley was then opened. The balloon was checked. It was placed over a tracheal dilator, which was then advanced over the guidewire and through the previously dilated tract. The Shiley tracheostomy tube was noted to pass in the trachea with little resistance. The guidewire and dilator tubes were removed from the trachea. An inner cannula was placed  through the tracheostomy tube. The tracheostomy was then secured at the anterior neck with 4 monofilament sutures. The oral endotracheal tube was removed and the ventilator was attached to the newly placed tracheostomy tube. Adequate tidal volumes were noted. The cuff was inflated and no evidence of air leak was noted. No evidence of bleeding was noted. At this point, the procedure was concluded. Post-procedure chest x-ray was ordered.  Complications:  No immediate complications were noted.  Hemodynamic parameters and oxygenation remained stable throughout the procedure.  Estimated blood loss:  Less then 5 mL.  Raylene Miyamoto., MD Pulmonary and [place] Pager: [phone]  09/21/2015, 4:25 PM    Can follo wup with [name] trach clinic [phone]

## 2015-09-21 NOTE — Progress Notes (Signed)
Nutrition Follow-up  DOCUMENTATION CODES:   Obesity unspecified  INTERVENTION:  - RD will follow-up 6/29 for POC concerning ileus, nutrition support.  NUTRITION DIAGNOSIS:   Inadequate oral intake related to inability to eat as evidenced by NPO status. -ongoing  GOAL:   Provide needs based on ASPEN/SCCM guidelines -unmet with TF on hold.   MONITOR:   Vent status, Weight trends, Labs, I & O's  ASSESSMENT:   70 y.o. female with medical history significant of HTN, tobacco abuse, DM type 2, OSA on CPAP, presented to the ED via ambulance with complaints of shortness of breath for the last 3 days which significantly worsened last night. She reports an associated cough with production of white sputum as well as chest soreness with deep inhalation, wheeze, and slight fever. She denies any pain while coughing.  6/27 Pt with OGT to suction with ~100cc drainage present at this time. Per NP note this AM, plan to wean pressors as tolerated and continues with possible need for trach. RN reports findings of ileus and plan to continue to hold TF related to the same.   Patient is currently intubated on ventilator support MV: 13.5 L/min Temp (24hrs), Avg:99.9 F (37.7 C), Min:98.8 F (37.1 C), Max:101.4 F (38.6 C) Propofol: none  RD will follow-up 6/29 for POC concerning ileus, plans for nutrition support. Unable to meet needs at this time. Medications reviewed; sliding scale Novolog, 10 units Levemir/day, daily multivitamin per tube, 40 mg Protonix/day, PRN Senokot. Labs reviewed; CBGs: 80 and 90 mg/dL today, BUN: 30 mg/dL, Ca: 8.1 mg/dL.  IVF: NP note states plan for D5-1/2 NS @ 75 mL/hr (306 kcal/24 hours). Drips: Neo @ 8 mcg/min, Fentanyl @ 50 mcg/hr, Precedex @ 0.3 mcg/kg/hr.   6/26 - Pt continues with OGT. TF on hold with OGT to suction since yesterday with orders at that time for Protonix and 1 dose Lactulose.  - Repeat XCR for abdominal distention completed at bedside shortly  before RD visit.  - Weight up 2 kg since admission (165 lbs versus 163 lbs); will continue to use admission weight to calculate nutrition needs.  - Patient is currently intubated on ventilator support; MV: 9.4 L/min; Temp (24hrs), Max:103.2 F (39.6 C); Propofol: none, switched from Propofol to Precedex for sedation (per Dr. Bari Mantis note yesterday: to enable weaning). - Per Dr. Matilde Bash note today at 1040: plan to discuss possible trach with family.  Drips: Neo @ 13 mcg/min, Fentanyl @ 75 mcg/hr, Precedex @ 0.5 mcg/kg/hr.   *SEE CHART FOR RD NOTES EARLIER IN ADMISSION.     Diet Order:  Diet NPO time specified  Skin:  Reviewed, no issues  Last BM:  6/19  Height:   Ht Readings from Last 1 Encounters:  09/24/2015 5\' 1"  (1.549 m)    Weight:   Wt Readings from Last 1 Encounters:  09/21/15 162 lb 11.2 oz (73.8 kg)    Ideal Body Weight:  47.73 kg (kg)  BMI:  Body mass index is 30.76 kg/(m^2).  Estimated Nutritional Needs:   Kcal:  [id number] (11-14 kcal/kg actual body weight)  Protein:  95 grams (2 grams/kg IBW)  Fluid:  > 1L/day  EDUCATION NEEDS:   No education needs identified at this time     Jarome Matin, MS, RD, LDN Inpatient Clinical Dietitian Pager # [phone] After hours/weekend pager # [phone]

## 2015-09-21 NOTE — Procedures (Signed)
Bronchoscopy Procedure Note Rebecca Richards [id number] 12/12/1945  Procedure: Bronchoscopy Indications: Tracheostomy placement  Procedure Details Consent: Risks of procedure as well as the alternatives and risks of each were explained to the (patient/caregiver).  Consent for procedure obtained. Time Out: Verified patient identification, verified procedure, site/side was marked, verified correct patient position, special equipment/implants available, medications/allergies/relevent history reviewed, required imaging and test results available.  Performed  In preparation for procedure, patient was given 100% FiO2 and bronchoscope lubricated. Sedation: Benzodiazepines, Muscle relaxants and Etomidate  Airway entered and the following bronchi were examined: upper airway examined, direct visualization for tracheostomy placement  Procedures performed: Brushings performed Bronchoscope removed.    Evaluation Hemodynamic Status: BP stable throughout; O2 sats: stable throughout Patient's Current Condition: stable Specimens:  None Complications: No apparent complications Patient did tolerate procedure well.   Procedure performed under direct supervision of Dr. Titus Mould.  Noe Gens, NP-C [facility] Pgr: [phone] or if no answer [phone] 09/21/2015, 4:26 PM  Supervised entire procedure of backing up ett, observing trach , uncomplicated, and trach placement after wnl  Lavon Paganini. Titus Mould, MD, [place] Pgr: [facility]

## 2015-09-22 ENCOUNTER — Inpatient Hospital Stay (HOSPITAL_COMMUNITY): Payer: Medicare HMO

## 2015-09-22 LAB — BLOOD GAS, ARTERIAL
ACID-BASE DEFICIT: 0.8 mmol/L (ref 0.0–2.0)
Bicarbonate: 23.4 mEq/L (ref 20.0–24.0)
DRAWN BY: 103701
FIO2: 1
MECHVT: 450 mL
O2 SAT: 93.6 %
PATIENT TEMPERATURE: 101.6
PEEP/CPAP: 5 cmH2O
PH ART: 7.371 (ref 7.350–7.450)
PO2 ART: 86.3 mmHg (ref 80.0–100.0)
RATE: 14 resp/min
TCO2: 21.4 mmol/L (ref 0–100)
pCO2 arterial: 42.3 mmHg (ref 35.0–45.0)

## 2015-09-22 LAB — CBC
HCT: 20 % — ABNORMAL LOW (ref 36.0–46.0)
HEMATOCRIT: 28.3 % — AB (ref 36.0–46.0)
HEMOGLOBIN: 6.2 g/dL — AB (ref 12.0–15.0)
HEMOGLOBIN: 9 g/dL — AB (ref 12.0–15.0)
MCH: 26.8 pg (ref 26.0–34.0)
MCH: 27.3 pg (ref 26.0–34.0)
MCHC: 31 g/dL (ref 30.0–36.0)
MCHC: 31.8 g/dL (ref 30.0–36.0)
MCV: 86.6 fL (ref 78.0–100.0)
MCV: 85.8 fL (ref 78.0–100.0)
Platelets: 546 10*3/uL — ABNORMAL HIGH (ref 150–400)
Platelets: 591 10*3/uL — ABNORMAL HIGH (ref 150–400)
RBC: 2.31 MIL/uL — ABNORMAL LOW (ref 3.87–5.11)
RBC: 3.3 MIL/uL — ABNORMAL LOW (ref 3.87–5.11)
RDW: 16.3 % — ABNORMAL HIGH (ref 11.5–15.5)
RDW: 16.2 % — ABNORMAL HIGH (ref 11.5–15.5)
WBC: 12.5 10*3/uL — ABNORMAL HIGH (ref 4.0–10.5)
WBC: 13 10*3/uL — ABNORMAL HIGH (ref 4.0–10.5)

## 2015-09-22 LAB — ABO/RH: ABO/RH(D): A POS

## 2015-09-22 LAB — COMPREHENSIVE METABOLIC PANEL
ALBUMIN: 1.8 g/dL — AB (ref 3.5–5.0)
ALK PHOS: 118 U/L (ref 38–126)
ALT: 32 U/L (ref 14–54)
ANION GAP: 7 (ref 5–15)
AST: 40 U/L (ref 15–41)
BILIRUBIN TOTAL: 0.5 mg/dL (ref 0.3–1.2)
BUN: 17 mg/dL (ref 6–20)
CALCIUM: 7.3 mg/dL — AB (ref 8.9–10.3)
CO2: 24 mmol/L (ref 22–32)
Chloride: 102 mmol/L (ref 101–111)
Creatinine, Ser: 0.79 mg/dL (ref 0.44–1.00)
GFR calc non Af Amer: >60 mL/min (ref >60)
GLUCOSE: 569 mg/dL — AB (ref 65–99)
Potassium: 2.7 mmol/L — CL (ref 3.5–5.1)
Sodium: 133 mmol/L — ABNORMAL LOW (ref 135–145)
TOTAL PROTEIN: 5.9 g/dL — AB (ref 6.5–8.1)

## 2015-09-22 LAB — GLUCOSE, CAPILLARY
GLUCOSE-CAPILLARY: 122 mg/dL — AB (ref 65–99)
GLUCOSE-CAPILLARY: 148 mg/dL — AB (ref 65–99)
GLUCOSE-CAPILLARY: 93 mg/dL (ref 65–99)
Glucose-Capillary: 106 mg/dL — ABNORMAL HIGH (ref 65–99)
Glucose-Capillary: 78 mg/dL (ref 65–99)
Glucose-Capillary: 85 mg/dL (ref 65–99)
Glucose-Capillary: 160 mg/dL — ABNORMAL HIGH (ref 65–99)
Glucose-Capillary: 144 mg/dL — ABNORMAL HIGH (ref 65–99)

## 2015-09-22 LAB — MAGNESIUM
MAGNESIUM: 1.5 mg/dL — AB (ref 1.7–2.4)
Magnesium: 2 mg/dL (ref 1.7–2.4)

## 2015-09-22 LAB — TRIGLYCERIDES: TRIGLYCERIDES: 250 mg/dL — AB (ref <150)

## 2015-09-22 LAB — BASIC METABOLIC PANEL
ANION GAP: 8 (ref 5–15)
BUN: 18 mg/dL (ref 6–20)
CHLORIDE: 108 mmol/L (ref 101–111)
CO2: 26 mmol/L (ref 22–32)
Calcium: 8.1 mg/dL — ABNORMAL LOW (ref 8.9–10.3)
Creatinine, Ser: 0.71 mg/dL (ref 0.44–1.00)
GFR calc Af Amer: >60 mL/min (ref >60)
GFR calc non Af Amer: >60 mL/min (ref >60)
GLUCOSE: 126 mg/dL — AB (ref 65–99)
POTASSIUM: 3.5 mmol/L (ref 3.5–5.1)
Sodium: 142 mmol/L (ref 135–145)

## 2015-09-22 LAB — PHOSPHORUS
Phosphorus: 2.7 mg/dL (ref 2.5–4.6)
Phosphorus: 2.3 mg/dL — ABNORMAL LOW (ref 2.5–4.6)

## 2015-09-22 LAB — PREPARE RBC (CROSSMATCH)

## 2015-09-22 MED ORDER — MAGNESIUM SULFATE 50 % IJ SOLN
4.0000 g | Freq: Once | INTRAVENOUS | Status: AC
  Administered 2015-09-22: 4 g via INTRAVENOUS
  Filled 2015-09-22: qty 8

## 2015-09-22 MED ORDER — SODIUM PHOSPHATES 45 MMOLE/15ML IV SOLN
10.0000 mmol | Freq: Once | INTRAVENOUS | Status: AC
  Administered 2015-09-22: 10 mmol via INTRAVENOUS
  Filled 2015-09-22: qty 3.33

## 2015-09-22 MED ORDER — PROPOFOL 1000 MG/100ML IV EMUL
0.0000 ug/kg/min | INTRAVENOUS | Status: DC
  Administered 2015-09-22: 10 ug/kg/min via INTRAVENOUS
  Administered 2015-09-23 (×2): 35 ug/kg/min via INTRAVENOUS
  Administered 2015-09-24: 20 ug/kg/min via INTRAVENOUS
  Administered 2015-09-24 (×2): 45 ug/kg/min via INTRAVENOUS
  Administered 2015-09-25: 20 ug/kg/min via INTRAVENOUS
  Filled 2015-09-22 (×9): qty 100

## 2015-09-22 MED ORDER — PROPOFOL 1000 MG/100ML IV EMUL
INTRAVENOUS | Status: AC
  Administered 2015-09-22: 1000 mg
  Filled 2015-09-22: qty 100

## 2015-09-22 MED ORDER — POTASSIUM CHLORIDE 20 MEQ/15ML (10%) PO SOLN
40.0000 meq | Freq: Once | ORAL | Status: AC
  Administered 2015-09-22: 40 meq
  Filled 2015-09-22: qty 30

## 2015-09-22 MED ORDER — ACETYLCYSTEINE 20 % IN SOLN
4.0000 mL | Freq: Two times a day (BID) | RESPIRATORY_TRACT | Status: AC
  Administered 2015-09-22 – 2015-09-24 (×4): 4 mL via RESPIRATORY_TRACT
  Filled 2015-09-22 (×4): qty 4

## 2015-09-22 MED ORDER — SODIUM CHLORIDE 0.9 % IV SOLN
INTRAVENOUS | Status: DC
  Administered 2015-09-22 – 2015-09-23 (×2): via INTRAVENOUS

## 2015-09-22 MED ORDER — POTASSIUM CHLORIDE 10 MEQ/50ML IV SOLN
10.0000 meq | INTRAVENOUS | Status: AC
  Administered 2015-09-22 (×4): 10 meq via INTRAVENOUS
  Filled 2015-09-22 (×4): qty 50

## 2015-09-22 MED ORDER — MIDAZOLAM HCL 2 MG/2ML IJ SOLN
2.0000 mg | INTRAMUSCULAR | Status: DC | PRN
  Administered 2015-09-22 – 2015-09-24 (×3): 2 mg via INTRAVENOUS
  Filled 2015-09-22 (×4): qty 2

## 2015-09-22 MED ORDER — FUROSEMIDE 10 MG/ML IJ SOLN
40.0000 mg | Freq: Two times a day (BID) | INTRAMUSCULAR | Status: DC
  Administered 2015-09-22 – 2015-09-23 (×2): 40 mg via INTRAVENOUS
  Filled 2015-09-22 (×3): qty 4

## 2015-09-22 MED ORDER — VITAL HIGH PROTEIN PO LIQD
1000.0000 mL | ORAL | Status: DC
  Administered 2015-09-22: 1000 mL
  Filled 2015-09-22 (×4): qty 1000

## 2015-09-22 MED ORDER — SODIUM CHLORIDE 0.9 % IV SOLN
Freq: Once | INTRAVENOUS | Status: AC
Start: 2015-09-22 — End: 2015-09-22
  Administered 2015-09-22: 06:00:00 via INTRAVENOUS

## 2015-09-22 MED ORDER — PRO-STAT SUGAR FREE PO LIQD: 30.0000 mL | Freq: Two times a day (BID) | ORAL | Status: DC

## 2015-09-22 MED ORDER — VITAL HIGH PROTEIN PO LIQD
1000.0000 mL | ORAL | Status: DC
  Filled 2015-09-22: qty 1000

## 2015-09-22 MED ORDER — FREE WATER
50.0000 mL | Status: DC
  Administered 2015-09-22 – 2015-09-26 (×24): 50 mL

## 2015-09-22 MED ORDER — DEXMEDETOMIDINE HCL IN NACL 400 MCG/100ML IV SOLN
0.4000 ug/kg/h | INTRAVENOUS | Status: DC
  Administered 2015-09-22: 0.2 ug/kg/h via INTRAVENOUS
  Filled 2015-09-22: qty 100

## 2015-09-22 MED ORDER — FLUCONAZOLE IN SODIUM CHLORIDE 100-0.9 MG/50ML-% IV SOLN
100.0000 mg | INTRAVENOUS | Status: DC
  Administered 2015-09-22 – 2015-09-25 (×4): 100 mg via INTRAVENOUS
  Filled 2015-09-22 (×5): qty 50

## 2015-09-22 NOTE — Progress Notes (Signed)
[facility] Progress Note Patient Name: MICHELA HILFIKER DOB: 1946-01-11 MRN: [id number]   Date of Service  09/22/2015  HPI/Events of Note  Low k , mag  eICU Interventions  supp iv     Intervention Category Major Interventions: Electrolyte abnormality - evaluation and management  FEINSTEIN,DANIEL J. 09/22/2015, 7:39 PM

## 2015-09-22 NOTE — Progress Notes (Signed)
eLink Physician-Brief Progress Note Patient Name: Rebecca Richards DOB: 1945-12-04 MRN: [id number]   Date of Service  09/22/2015  HPI/Events of Note  Clinically c/w mucous plug abg revieweed pcxr ards, edema  eICU Interventions  Peep to 10 from 6 Lasix needed  kvo Chest pt mucomyst     Intervention Category Major Interventions: Hypoxemia - evaluation and management  Raylene Miyamoto. 09/22/2015, 5:17 PM

## 2015-09-22 NOTE — Progress Notes (Signed)
eLink Physician-Brief Progress Note Patient Name: Rebecca Richards DOB: [birth year] MRN: [id number]   Date of Service  09/22/2015  HPI/Events of Note  Called hypoxia vent dywsch  eICU Interventions  Stat abg, stat pcxr Dc prec, prop use     Intervention Category Major Interventions: Hypoxemia - evaluation and management  Raylene Miyamoto. 09/22/2015, 5:02 PM

## 2015-09-22 NOTE — Progress Notes (Signed)
PULMONARY / CRITICAL CARE MEDICINE   Name: BLOSSOM FAMOUS MRN: [id number] DOB: Feb 04, 1946    ADMISSION DATE:  08/28/2015 CONSULTATION DATE:  09/03/2015  REFERRING MD:  Kathie Dike, M.D. / [facility]  CHIEF COMPLAINT:  Dyspnea  BRIEF:   70 y/o female with DM2 with ARDS and CAP initially admitted to [facility] on 6/14.  Developed MRSA pneumonia 6/18.  SUBJECTIVE:   Trach pm of 6/27.  BMP with concerns for lab error >>hyperglycemia, low K / Na, Hgb 6.2.  Given 1 unit PRBC's overnight, K  VITAL SIGNS: BP 114/57 mmHg  Pulse 52  Temp(Src) 99 F (37.2 C) (Axillary)  Resp 20  Ht 5\' 1"  (1.549 m)  Wt 160 lb 4.4 oz (72.7 kg)  BMI 30.30 kg/m2  SpO2 100%  HEMODYNAMICS: CVP:  [5 mmHg-19 mmHg] 15 mmHg  VENTILATOR SETTINGS: Vent Mode:  [-] PRVC FiO2 (%):  [40 %] 40 % Set Rate:  [14 bmp] 14 bmp Vt Set:  [450 mL] 450 mL PEEP:  [6 cmH20] 6 cmH20 Plateau Pressure:  [26 cmH20-36 cmH20] 29 cmH20  INTAKE / OUTPUT:  Intake/Output Summary (Last 24 hours) at 09/22/15 0914 Last data filed at 09/22/15 0846  Gross per 24 hour  Intake 3579.06 ml  Output    975 ml  Net 2604.06 ml   PHYSICAL EXAMINATION: General: chronically ill appearing female in NAD on vent Neuro: opens eyes to voice, chewing on tongue, alert, follows commands HEENT: trach in place, tongue edema / crusting Cardiac: RRR, No MRG Chest: Clear, no wheeze, crackles Abd: soft, NT, diminshed bs Ext: no edema Skin: no rashes  LABS:  BMET  Recent Labs Lab 09/19/15 0450 09/21/15 0310 09/22/15 0300  NA 144 142 133*  K 3.5 3.6 2.7*  CL 107 108 102  CO2 29 27 24   BUN 32* 30* 17  CREATININE 0.81 0.65 0.79  GLUCOSE 110* 142* 569*    Electrolytes  Recent Labs Lab 09/19/15 0245 09/19/15 0450 09/20/15 0450 09/21/15 0310 09/22/15 0300  CALCIUM 8.7* 8.4*  --  8.1* 7.3*  MG 2.4  --  2.3 2.4  --   PHOS 3.9 3.0  --  3.9  --     CBC  Recent Labs Lab 09/20/15 0450 09/21/15 0310 09/22/15 0300  WBC  16.4* 14.3* 12.5*  HGB 7.6* 7.0* 6.2*  HCT 24.4* 22.2* 20.0*  PLT 491* 495* 546*    Coag's  Recent Labs Lab 09/21/15 1220  INR 1.29    Sepsis Markers No results for input(s): LATICACIDVEN, PROCALCITON, O2SATVEN in the last 168 hours.  ABG  Recent Labs Lab 09/18/15 2006  PHART 7.385  PCO2ART 56.0*  PO2ART 99.2    Liver Enzymes  Recent Labs Lab 09/19/15 0245 09/19/15 0450 09/22/15 0300  AST  --   --  40  ALT  --   --  32  ALKPHOS  --   --  118  BILITOT  --   --  0.5  ALBUMIN 2.2* 2.0* 1.8*    Cardiac Enzymes No results for input(s): TROPONINI, PROBNP in the last 168 hours.  Glucose  Recent Labs Lab 09/21/15 1207 09/21/15 1650 09/21/15 1959 09/22/15 0015 09/22/15 0356 09/22/15 0729  GLUCAP 93 159* 150* 148* 122* 106*    Imaging Dg Abd 1 View  09/20/2015  CLINICAL DATA:  Abdominal distension. EXAM: ABDOMEN - 1 VIEW COMPARISON:  Radiograph of September 19, 2015. FINDINGS: Postsurgical changes are noted in lower lumbar spine. Nasogastric tube tip is seen in  the stomach. Status post cholecystectomy. Stable air-filled right and transverse colon is noted, but no significant small bowel dilatation is noted. Phleboliths are noted in the pelvis. IMPRESSION: Stable air-filled right and transverse colonic distention is noted concerning for ileus or less likely distal obstruction. Electronically Signed   By: Marijo Conception, M.D.   On: 09/20/2015 12:38   Dg Chest Port 1 View  09/22/2015  CLINICAL DATA:  Respiratory failure EXAM: PORTABLE CHEST 1 VIEW COMPARISON:  09/21/2015 FINDINGS: Tracheostomy tube, nasogastric catheter and right-sided PICC line are again seen in stable position. A spinal stimulator is again noted. Diffuse patchy airspace disease is again identified particularly in the right apex and left base stable from the prior exam. No bony abnormality is noted. IMPRESSION: Stable appearance of the chest when compared with the prior exam. Electronically Signed   By:  Inez Catalina M.D.   On: 09/22/2015 07:17   Dg Chest Port 1 View  09/21/2015  CLINICAL DATA:  Pneumonia.  Nasogastric tube placement. EXAM: PORTABLE CHEST 1 VIEW COMPARISON:  09/21/2015 FINDINGS: Tracheostomy tube is in place/ with tip approximately 4.1 cm above the carina. Nasogastric tube is in place with tip beyond the gastroesophageal junction. Right-sided PICC line tip overlies the level of superior vena cava. Heart size is normal. There are patchy infiltrates throughout the lungs bilaterally, similar in appearance to the previous exam. IMPRESSION: Interval placement of tracheostomy tube. Persistent bilateral infiltrates. Electronically Signed   By: Nolon Nations M.D.   On: 09/21/2015 16:54   Dg Chest Port 1 View  09/21/2015  CLINICAL DATA:  Respiratory failure. EXAM: PORTABLE CHEST 1 VIEW COMPARISON:  09/20/2015, 09/16/2015, 08/28/2015, 08/18/2014. FINDINGS: Endotracheal tube, NG tube, right PICC line stable position . Stable cardiomegaly. Diffuse bilateral unchanged interstitial infiltrates are again noted. Tiny pleural effusions cannot be excluded. No pneumothorax. IMPRESSION: 1. Lines and tubes in stable position. 2. No interim change in diffuse bilateral pulmonary interstitial prominence. Findings consistent with active interstitial disease/ edema. Tiny bilateral pleural effusions cannot be excluded. 3. Stable cardiomegaly. Electronically Signed   By: Marcello Moores  Register   On: 09/21/2015 07:16    STUDIES:  TTE 6/15 >> Moderate LVH with EF 70-75%. Normal regional wall motion. Pulmonary artery systolic pressure 46 mmHg. RV normal in size.  MICROBIOLOGY: MRSA PCR 6/14:  Positive Blood Ctx x2 6/14 >> ng Urine Ctx 6/14:  Negative  Urine Strep Ag 6/14:  Negative Urine Legionella Ag 6/14: Negative Tracheal Asp Ctx 6/15:  Negative Tracheal Asp Ctx 6/18>> MRSA  ANTIBIOTICS: Rocephin 6/14 >> 6/19 Azithromycin 6/14 >> 6/19  Cefepime 6/19 > 6/21 Vancomycin 6/19 > 6/28  SIGNIFICANT  EVENTS: 6/14  Admission to [facility], intubation, transfer to [facility] 6/23  Tolerated pressure support 15/5  6/26  Weaning on PSV 6/28  Multiple lab changes, rx'd with K, 1 unit PRBC's  LINES/TUBES: ETT 6/14 >> 6/27 RUE TL PICC 6/17 >> Trach 6/27 (DF) >>   ASSESSMENT / PLAN:  PULMONARY A: Acute hypoxic respiratory failure from MRSA PNA, ARDS. Tracheostomy Status - 6/27 per DF Hx of OSA. P:   PRVC 8 cc/kg  Wean PEEP / FiO2 for sats > 92% Pressure support wean as tolerated  F/u CXR PRN BDs Trach care per protocol  Would like to diurese once BP will allow  CARDIOVASCULAR A:  Severe sepsis from PNA. Hx of HTN. P:  Neo gtt for MAP >65 Wean pressors to off  Tele monitoring   RENAL A:   Hypernatremia -  resolved Decreased UOP - noted 6/27 am P:   Monitor renal fx, urine outpt, electrolytes NS @ 20 ml/hr Discontinue D5 Repeat labs now given significant changes from day prior   GASTROINTESTINAL A:   Nutrition. Hx of Hepatitis C. Concern for ileus P:   Hold TF for now  Reassess KUB, if no ilues resume TF NG tube to LIS for decompression Protonix for SUP  HEMATOLOGIC/ONCOLOGIC A:   Anemia of critical illness and chronic disease. Hx of Rt renal cancer. P:  F/u CBC Heparin [place]  Transfuse per ICU guidelines   INFECTIOUS A:   Severe sepsis 2nd to MRSA PNA. Fever ? Thrush - noted on bronch for trach  P:   Plan to continue vancomycin for total of 10ds D10/10 abx, discontinue vancomycin 6/28 Follow cultures as above Diflucan 100 mg IV QD x7 days for thrush  ENDOCRINE A:   Hx DM Type 2 - BG controlled. Hx Pituitary Tumor - S/P Resection. P:   SSI with levemir Discontinue D5  NEUROLOGIC A:   Acute metabolic encephalopathy. Hx of anxiety, depression. Hx of chronic pain s/p spinal cord stimulator. P:   RASS goal: 0 to -1 Fentanyl gtt for pain, reduce ceiling to 200 mcg (has not needed) Lyrica bid VT  Noe Gens, NP-C [facility] Pgr: [phone] or if no answer [phone] 09/22/2015, 9:14 AM   Attending note: I have seen and examined the patient with nurse practitioner/resident and agree with the note. History, labs and imaging reviewed.  70 Y/O admitted with CAP, ARDS, developed MRSA HCAP. S/p trach yesterday without complication Continue PSV weans Recheck AXR to assess ileus Continue vanco for total 10 days. Stop date 6/28  Critical care time- 35 mins. This represents my time independent of the NPs time taking care of the patient.  Marshell Garfinkel MD [facility] Pager [phone] If no answer or after 3pm call: [phone] 09/22/2015, 12:38 PM

## 2015-09-22 NOTE — Progress Notes (Signed)
Nutrition Follow-up  DOCUMENTATION CODES:   Obesity unspecified  INTERVENTION:  - Will order Vital High Protein @ 45 mL/hr which will provide 1080 kcal, 95 grams of protein, and 903 mL free water. - Continue PEPuP protocol.  - Will order 50 mL free water every 4 hours (300 mL free water/24 hours).  - Will d/c multivitamin as ordered TF rate will meet micronutrient needs. - RD will follow-up 6/29.  NUTRITION DIAGNOSIS:   Inadequate oral intake related to inability to eat as evidenced by NPO status. -ongoing  GOAL:   Provide needs based on ASPEN/SCCM guidelines -unmet with TF not yet restarted.   MONITOR:   Vent status, TF tolerance, Weight trends, Skin, I & O's  REASON FOR ASSESSMENT:   Consult Enteral/tube feeding initiation and management  ASSESSMENT:   70 y.o. female with medical history significant of HTN, tobacco abuse, DM type 2, OSA on CPAP, presented to the ED via ambulance with complaints of shortness of breath for the last 3 days which significantly worsened last night. She reports an associated cough with production of white sputum as well as chest soreness with deep inhalation, wheeze, and slight fever. She denies any pain while coughing.  6/28 New consult for TF initiation and management. Spoke with RN who confirms no longer concern for ileus. NGT in place. Pt s/p trach placement yesterday afternoon, connected to vent. Tongue continues to be swollen. Nutrition needs re-estimated.  Patient is currently intubated on ventilator support MV: 14 L/min Temp (24hrs), Avg:100 F (37.8 C), Min:98.2 F (36.8 C), Max:103 F (39.4 C) Propofol: none  Will order TF as outlined above and follow-up tomorrow. Weight overall stable since admission (160-165 lbs mainly). Medications reviewed; sliding scale Novolog, 10 units Levemir/day, daily multivitamin, 40 mg Protonix/day, 40 mEq KCl per tube x1 dose today, 5 mL Senokot PRN. Labs reviewed; CBGs: 106-148 mg/dL today, Ca: 8.1  mg/dL.    Drips: Neo @ 5 mcg/min, Precedex @ 0.3 mcg/kg/min, Fentanyl @ 175 mcg/hr.  IVF: D5 now d/c'ed.     Diet Order:   NPO  Skin:  Wound (see comment) (New trach site)  Last BM:  6/19  Height:   Ht Readings from Last 1 Encounters:  09/20/2015 5\' 1"  (1.549 m)    Weight:   Wt Readings from Last 1 Encounters:  09/22/15 160 lb 4.4 oz (72.7 kg)    Ideal Body Weight:  47.73 kg (kg)  BMI:  Body mass index is 30.3 kg/(m^2).  Estimated Nutritional Needs:   Kcal:  1050-1193 (22-25 kcal/kg IBW)  Protein:  87-109 grams (1.2-1.5 grams/kg actual body weight)  Fluid:  > 1L/day  EDUCATION NEEDS:   No education needs identified at this time     Jarome Matin, MS, RD, LDN Inpatient Clinical Dietitian Pager # [phone] After hours/weekend pager # [phone]

## 2015-09-22 NOTE — Progress Notes (Signed)
eLink Physician-Brief Progress Note Patient Name: JANNINE ARIA DOB: 07-13-1945 MRN: [id number]   Date of Service  09/22/2015  HPI/Events of Note  Anemia - Hgb = 6.2.  eICU Interventions  Will transfuse 1 unit PRBC now.      Intervention Category Intermediate Interventions: Other:  Lysle Dingwall 09/22/2015, 3:44 AM

## 2015-09-22 NOTE — Progress Notes (Signed)
[name] DOB: [birth year] MRN: [id number]   Date of Service  09/22/2015  HPI/Events of Note  K+ = 2.7 and Creatinine = 0.79.  eICU Interventions  Will order: 1. Replete K+. 2. BMP at 12 noon.      Intervention Category Intermediate Interventions: Electrolyte abnormality - evaluation and management  [name] 09/22/2015, 4:25 AM

## 2015-09-23 ENCOUNTER — Inpatient Hospital Stay (HOSPITAL_COMMUNITY): Payer: Medicare HMO

## 2015-09-23 LAB — CBC
HCT: 34.6 % — ABNORMAL LOW (ref 36.0–46.0)
HEMOGLOBIN: 11.2 g/dL — AB (ref 12.0–15.0)
MCH: 27.5 pg (ref 26.0–34.0)
MCHC: 32.4 g/dL (ref 30.0–36.0)
MCV: 85 fL (ref 78.0–100.0)
Platelets: 572 10*3/uL — ABNORMAL HIGH (ref 150–400)
RBC: 4.07 MIL/uL (ref 3.87–5.11)
RDW: 16.1 % — ABNORMAL HIGH (ref 11.5–15.5)
WBC: 17.6 10*3/uL — ABNORMAL HIGH (ref 4.0–10.5)

## 2015-09-23 LAB — URINALYSIS, ROUTINE W REFLEX MICROSCOPIC
BILIRUBIN URINE: NEGATIVE
GLUCOSE, UA: NEGATIVE mg/dL
HGB URINE DIPSTICK: NEGATIVE
KETONES UR: NEGATIVE mg/dL
LEUKOCYTES UA: NEGATIVE
Nitrite: NEGATIVE
PROTEIN: NEGATIVE mg/dL
Specific Gravity, Urine: 1.02 (ref 1.005–1.030)
pH: 5.5 (ref 5.0–8.0)

## 2015-09-23 LAB — BASIC METABOLIC PANEL
ANION GAP: 10 (ref 5–15)
Anion gap: 9 (ref 5–15)
BUN: 15 mg/dL (ref 6–20)
BUN: 15 mg/dL (ref 6–20)
CALCIUM: 7.2 mg/dL — AB (ref 8.9–10.3)
CHLORIDE: 101 mmol/L (ref 101–111)
CO2: 28 mmol/L (ref 22–32)
CO2: 25 mmol/L (ref 22–32)
Calcium: 8.1 mg/dL — ABNORMAL LOW (ref 8.9–10.3)
Chloride: 105 mmol/L (ref 101–111)
Creatinine, Ser: 0.82 mg/dL (ref 0.44–1.00)
Creatinine, Ser: 0.73 mg/dL (ref 0.44–1.00)
GFR calc Af Amer: >60 mL/min (ref >60)
GFR calc non Af Amer: >60 mL/min (ref >60)
GLUCOSE: 190 mg/dL — AB (ref 65–99)
Glucose, Bld: 168 mg/dL — ABNORMAL HIGH (ref 65–99)
Potassium: 3 mmol/L — ABNORMAL LOW (ref 3.5–5.1)
Potassium: 2.7 mmol/L — CL (ref 3.5–5.1)
Sodium: 139 mmol/L (ref 135–145)
Sodium: 139 mmol/L (ref 135–145)

## 2015-09-23 LAB — BLOOD GAS, ARTERIAL
ACID-BASE EXCESS: 3.6 mmol/L — AB (ref 0.0–2.0)
Bicarbonate: 28.3 mEq/L — ABNORMAL HIGH (ref 20.0–24.0)
FIO2: 1
LHR: 14 {breaths}/min
O2 SAT: 93.9 %
PATIENT TEMPERATURE: 101.6
PCO2 ART: 50.1 mmHg — AB (ref 35.0–45.0)
PEEP/CPAP: 10 cmH2O
PH ART: 7.38 (ref 7.350–7.450)
PO2 ART: 86.9 mmHg (ref 80.0–100.0)
TCO2: 26.5 mmol/L (ref 0–100)
VT: 450 mL

## 2015-09-23 LAB — GLUCOSE, CAPILLARY
GLUCOSE-CAPILLARY: 136 mg/dL — AB (ref 65–99)
GLUCOSE-CAPILLARY: 183 mg/dL — AB (ref 65–99)
Glucose-Capillary: 130 mg/dL — ABNORMAL HIGH (ref 65–99)
Glucose-Capillary: 168 mg/dL — ABNORMAL HIGH (ref 65–99)
Glucose-Capillary: 109 mg/dL — ABNORMAL HIGH (ref 65–99)

## 2015-09-23 LAB — MAGNESIUM
MAGNESIUM: 2.5 mg/dL — AB (ref 1.7–2.4)
MAGNESIUM: 1.9 mg/dL (ref 1.7–2.4)

## 2015-09-23 LAB — TYPE AND SCREEN
ABO/RH(D): A POS
Antibody Screen: NEGATIVE
Unit division: 0

## 2015-09-23 LAB — PHOSPHORUS
PHOSPHORUS: 3 mg/dL (ref 2.5–4.6)
Phosphorus: 3.4 mg/dL (ref 2.5–4.6)

## 2015-09-23 MED ORDER — POTASSIUM CHLORIDE 20 MEQ/15ML (10%) PO SOLN
40.0000 meq | Freq: Two times a day (BID) | ORAL | Status: AC
  Administered 2015-09-23 – 2015-09-24 (×4): 40 meq
  Filled 2015-09-23 (×4): qty 30

## 2015-09-23 MED ORDER — POTASSIUM CHLORIDE 20 MEQ/15ML (10%) PO SOLN
40.0000 meq | Freq: Once | ORAL | Status: AC
  Administered 2015-09-23: 40 meq
  Filled 2015-09-23: qty 30

## 2015-09-23 MED ORDER — PRO-STAT SUGAR FREE PO LIQD
30.0000 mL | Freq: Three times a day (TID) | ORAL | Status: DC
  Administered 2015-09-23 – 2015-09-24 (×4): 30 mL
  Filled 2015-09-23 (×4): qty 30

## 2015-09-23 MED ORDER — PHENYLEPHRINE HCL 10 MG/ML IJ SOLN
0.0000 ug/min | INTRAMUSCULAR | Status: DC
  Administered 2015-09-23: 90 ug/min via INTRAVENOUS
  Administered 2015-09-24: 100 ug/min via INTRAVENOUS
  Administered 2015-09-24: 20 ug/min via INTRAVENOUS
  Administered 2015-09-24: 110 ug/min via INTRAVENOUS
  Administered 2015-09-25: 250 ug/min via INTRAVENOUS
  Administered 2015-09-25 (×7): 400 ug/min via INTRAVENOUS
  Administered 2015-09-25: 375 ug/min via INTRAVENOUS
  Administered 2015-09-25 – 2015-09-26 (×2): 400 ug/min via INTRAVENOUS
  Administered 2015-09-26 (×2): 325 ug/min via INTRAVENOUS
  Administered 2015-09-26: 375 ug/min via INTRAVENOUS
  Filled 2015-09-23 (×27): qty 4

## 2015-09-23 MED ORDER — POTASSIUM CHLORIDE 10 MEQ/50ML IV SOLN
10.0000 meq | INTRAVENOUS | Status: AC
  Administered 2015-09-23 (×3): 10 meq via INTRAVENOUS
  Filled 2015-09-23 (×2): qty 50

## 2015-09-23 MED ORDER — FUROSEMIDE 10 MG/ML IJ SOLN
80.0000 mg | Freq: Four times a day (QID) | INTRAMUSCULAR | Status: DC
  Administered 2015-09-23 – 2015-09-24 (×5): 80 mg via INTRAVENOUS
  Filled 2015-09-23 (×6): qty 8

## 2015-09-23 MED ORDER — VITAL HIGH PROTEIN PO LIQD
1000.0000 mL | ORAL | Status: DC
  Administered 2015-09-23: 1000 mL
  Administered 2015-09-23: 19:00:00
  Filled 2015-09-23: qty 1000

## 2015-09-23 MED ORDER — POTASSIUM CHLORIDE 10 MEQ/50ML IV SOLN
10.0000 meq | INTRAVENOUS | Status: DC
  Filled 2015-09-23: qty 50

## 2015-09-23 NOTE — Progress Notes (Signed)
Pt had non-sustained run of SVT with rate up to 160s per CCMD. Pt not symptomatic. Will continue to monitor.

## 2015-09-23 NOTE — Progress Notes (Signed)
eLink Physician-Brief Progress Note Patient Name: Rebecca Richards DOB: 1946/01/28 MRN: [id number]   Date of Service  09/23/2015  HPI/Events of Note  Same presentation as day prior Hypoxia, vent dyschrony Mucous?  eICU Interventions  Re increase sedation pcxr abg Suction  likley needs a bronch elective     Intervention Category Major Interventions: Hypoxemia - evaluation and management  FEINSTEIN,DANIEL J. 09/23/2015, 4:47 PM

## 2015-09-23 NOTE — Progress Notes (Signed)
Nutrition Follow-up  DOCUMENTATION CODES:   Obesity unspecified  INTERVENTION:  - Will change TF regimen due to Propofol: Vital High Protein @ 20 mL/hr with 30 mL Prostat TID. This regimen + kcal from Propofol will provide 1187 kcal, 87 grams of protein, and 401 mL free water. - Continue PEPuP protocol.  - RD will follow-up 6/30  NUTRITION DIAGNOSIS:   Inadequate oral intake related to inability to eat as evidenced by NPO status. -ongoing  GOAL:   Provide needs based on ASPEN/SCCM guidelines -meeting with current TF regimen.  MONITOR:   Vent status, TF tolerance, Weight trends, Skin, I & O's  ASSESSMENT:   70 y.o. female with medical history significant of HTN, tobacco abuse, DM type 2, OSA on CPAP, presented to the ED via ambulance with complaints of shortness of breath for the last 3 days which significantly worsened last night. She reports an associated cough with production of white sputum as well as chest soreness with deep inhalation, wheeze, and slight fever. She denies any pain while coughing.  6/29 Pt continues with NGT and receiving goal rate of TF this AM: Vital High Protein @ 45 mL/hr which is providing 1080 kcal, 95 grams of protein, and 903 mL free water. RN reports no issues with TF. She states bowel sounds have been faint and mainly heard in R quadrant but that pt did have BM x2 yesterday.   Patient is currently intubated on ventilator support MV: 11.2 L/min Temp (24hrs), Avg:100.6 F (38.1 C), Min:98.2 F (36.8 C), Max:102.8 F (39.3 C) Propofol: 15.4 ml/hr (407 kcal).  Estimated nutrition needs from yesterday remain the same today. Per chart review, weight up 1 lb since yesterday (161 lbs today). Will adjust TF regimen as outlined above due to start of Propofol and the kcal provided by this medication.   RD will follow-up tomorrow. Medications reviewed; 40 mg IV Lasix every 12 hours, sliding scale Novolog, 10 units Levemir/day, 40 mg Protonix/day, 40 mEq KCl  BID for 4 doses.   Drips: Propofol @ 35 mcg/kg/min, Neo @ 20 mcg/min, Fentanyl @ 175 mcg/hr.   6/28 - New consult for TF initiation and management.  - Spoke with RN who confirms no longer concern for ileus. NGT in place.  - Pt s/p trach placement yesterday afternoon, connected to vent.  - Tongue continues to be swollen.  - Nutrition needs re-estimated. - Patient is currently intubated on ventilator support; MV: 14 L/min; Temp (24hrs), Max:103 F (39.4 C); Propofol: none - Weight overall stable since admission (160-165 lbs mainly).   Drips: Neo @ 5 mcg/min, Precedex @ 0.3 mcg/kg/min, Fentanyl @ 175 mcg/hr.  IVF: D5 now d/c'ed.    Diet Order:   NPO  Skin:  Reviewed, no issues  Last BM:  6/28  Height:   Ht Readings from Last 1 Encounters:  09/07/2015 5\' 1"  (1.549 m)    Weight:   Wt Readings from Last 1 Encounters:  09/23/15 161 lb 13.1 oz (73.4 kg)    Ideal Body Weight:  47.73 kg (kg)  BMI:  Body mass index is 30.59 kg/(m^2).  Estimated Nutritional Needs:   Kcal:  1050-1193 (22-25 kcal/kg IBW)  Protein:  87-109 grams (1.2-1.5 grams/kg actual body weight)  Fluid:  > 1L/day  EDUCATION NEEDS:   No education needs identified at this time     Jarome Matin, MS, RD, LDN Inpatient Clinical Dietitian Pager # [phone] After hours/weekend pager # [phone]

## 2015-09-23 NOTE — Progress Notes (Signed)
PULMONARY / CRITICAL CARE MEDICINE   Name: Rebecca Richards MRN: [id number] DOB: 10-14-1945    ADMISSION DATE:  08/30/2015 CONSULTATION DATE:  09/15/2015  REFERRING MD:  Kathie Dike, M.D. / [facility]  CHIEF COMPLAINT:  Dyspnea  BRIEF:   70 y/o female with DM2 with ARDS and CAP initially admitted to [facility] on 6/14.  Developed MRSA pneumonia 6/18.  SUBJECTIVE:    Vent dyssynchrony overngiht Increased WBC count and temp today  VITAL SIGNS: BP 106/53 mmHg  Pulse 96  Temp(Src) 102.8 F (39.3 C) (Axillary)  Resp 24  Ht 5\' 1"  (1.549 m)  Wt 161 lb 13.1 oz (73.4 kg)  BMI 30.59 kg/m2  SpO2 98%  HEMODYNAMICS: CVP:  [7 mmHg-18 mmHg] 18 mmHg  VENTILATOR SETTINGS: Vent Mode:  [-] PRVC FiO2 (%):  [40 %-100 %] 70 % Set Rate:  [14 bmp] 14 bmp Vt Set:  [45 mL-450 mL] 450 mL PEEP:  [5 cmH20-10 cmH20] 10 cmH20 Plateau Pressure:  [16 cmH20-35 cmH20] 30 cmH20  INTAKE / OUTPUT:  Intake/Output Summary (Last 24 hours) at 09/23/15 1009 Last data filed at 09/23/15 1000  Gross per 24 hour  Intake 3562.36 ml  Output   4445 ml  Net -882.64 ml   PHYSICAL EXAMINATION: General: No distress, on vent Neuro: Opens eyes, follows commands HEENT: trach in place, tongue edema / crusting Cardiac: RRR, No MRG Chest: Clear, no wheeze, crackles Abd: Soft, NT, ND Ext: no edema Skin: no rashes  LABS:  BMET  Recent Labs Lab 09/22/15 0300 09/22/15 1017 09/23/15 0422  NA 133* 142 139  K 2.7* 3.5 3.0*  CL 102 108 101  CO2 24 26 28   BUN 17 18 15   CREATININE 0.79 0.71 0.82  GLUCOSE 569* 126* 168*    Electrolytes  Recent Labs Lab 09/22/15 0300 09/22/15 1017 09/22/15 1245 09/22/15 1711 09/23/15 0422  CALCIUM 7.3* 8.1*  --   --  8.1*  MG  --   --  2.0 1.5* 2.5*  PHOS  --   --  2.7 2.3* 3.4    CBC  Recent Labs Lab 09/22/15 0300 09/22/15 1017 09/23/15 0422  WBC 12.5* 13.0* 17.6*  HGB 6.2* 9.0* 11.2*  HCT 20.0* 28.3* 34.6*  PLT 546* 591* 572*     Coag's  Recent Labs Lab 09/21/15 1220  INR 1.29    Sepsis Markers No results for input(s): LATICACIDVEN, PROCALCITON, O2SATVEN in the last 168 hours.  ABG  Recent Labs Lab 09/18/15 2006 09/22/15 1645  PHART 7.385 7.371  PCO2ART 56.0* 42.3  PO2ART 99.2 86.3    Liver Enzymes  Recent Labs Lab 09/19/15 0245 09/19/15 0450 09/22/15 0300  AST  --   --  40  ALT  --   --  32  ALKPHOS  --   --  118  BILITOT  --   --  0.5  ALBUMIN 2.2* 2.0* 1.8*    Cardiac Enzymes No results for input(s): TROPONINI, PROBNP in the last 168 hours.  Glucose  Recent Labs Lab 09/22/15 1157 09/22/15 1539 09/22/15 1956 09/22/15 2325 09/23/15 0338 09/23/15 0720  GLUCAP 78 85 160* 144* 136* 130*    Imaging Dg Chest Port 1 View  09/23/2015  CLINICAL DATA:  Respiratory failure. EXAM: PORTABLE CHEST 1 VIEW COMPARISON:  09/22/2015. FINDINGS: Tracheostomy tube, NG tube, right PICC line in stable position. Heart size stable. Diffuse bilateral pulmonary infiltrates/edema again noted, no interim change. No prominent pleural effusion. No pneumothorax . Neurostimulator in stable position. IMPRESSION:  1. Lines and tubes in stable position. 2. Diffuse bilateral pulmonary infiltrates/edema. No interim change. Electronically Signed   By: Marcello Moores  Register   On: 09/23/2015 07:18   Dg Chest Port 1 View  09/22/2015  CLINICAL DATA:  Hypoxia and tachypnea EXAM: PORTABLE CHEST 1 VIEW COMPARISON:  September 22, 2015 FINDINGS: Tracheostomy catheter tip is 4.2 cm above the carina. Central catheter tip is in the superior vena cava. Nasogastric tube tip and side port below the diaphragm. Thoracic stimulator tips are in the lower thoracic region, stable. No pneumothorax. There is extensive interstitial and alveolar pulmonary edema bilaterally. There is mild cardiac enlargement with pulmonary venous hypertension. No adenopathy is evident by portable radiography. IMPRESSION: Tube and catheter positions as described without  pneumothorax. Extensive interstitial and alveolar edema, stable. The appearance is consistent with congestive heart failure. A degree of superimposed pneumonia and/ or ARDS cannot be excluded. More than one of these entities may exist concurrently. Overall there is no appreciable change compared to study obtained earlier in the day. Electronically Signed   By: Lowella Grip III M.D.   On: 09/22/2015 17:27   Dg Chest Port 1 View  09/22/2015  CLINICAL DATA:  Respiratory failure EXAM: PORTABLE CHEST 1 VIEW COMPARISON:  09/21/2015 FINDINGS: Tracheostomy tube, nasogastric catheter and right-sided PICC line are again seen in stable position. A spinal stimulator is again noted. Diffuse patchy airspace disease is again identified particularly in the right apex and left base stable from the prior exam. No bony abnormality is noted. IMPRESSION: Stable appearance of the chest when compared with the prior exam. Electronically Signed   By: Inez Catalina M.D.   On: 09/22/2015 07:17   Dg Chest Port 1 View  09/21/2015  CLINICAL DATA:  Pneumonia.  Nasogastric tube placement. EXAM: PORTABLE CHEST 1 VIEW COMPARISON:  09/21/2015 FINDINGS: Tracheostomy tube is in place/ with tip approximately 4.1 cm above the carina. Nasogastric tube is in place with tip beyond the gastroesophageal junction. Right-sided PICC line tip overlies the level of superior vena cava. Heart size is normal. There are patchy infiltrates throughout the lungs bilaterally, similar in appearance to the previous exam. IMPRESSION: Interval placement of tracheostomy tube. Persistent bilateral infiltrates. Electronically Signed   By: Nolon Nations M.D.   On: 09/21/2015 16:54   Dg Abd Portable 1v  09/22/2015  CLINICAL DATA:  Acute respiratory failure.  Follow-up ileus. EXAM: PORTABLE ABDOMEN - 1 VIEW COMPARISON:  09/20/2015 abdominal radiograph FINDINGS: Enteric tube terminates in the distal body of the stomach. Partially visualized spinal stimulator device  in the left abdomen with leads coursing into the thoracic spinal canal. Cholecystectomy clips are seen in the right upper quadrant of the abdomen. No dilated small bowel loops. Mild gaseous distention of the colon, significantly decreased. No evidence of pneumatosis or pneumoperitoneum. Patchy opacities at both lung bases are not appreciably changed. IMPRESSION: Nonobstructive bowel gas pattern.  Nearly resolved colonic ileus. Electronically Signed   By: Ilona Sorrel M.D.   On: 09/22/2015 10:38    STUDIES:  TTE 6/15 >> Moderate LVH with EF 70-75%. Normal regional wall motion. Pulmonary artery systolic pressure 46 mmHg. RV normal in size.  MICROBIOLOGY: MRSA PCR 6/14:  Positive Blood Ctx x2 6/14 >> ng Urine Ctx 6/14:  Negative  Urine Strep Ag 6/14:  Negative Urine Legionella Ag 6/14: Negative Tracheal Asp Ctx 6/15:  Negative Tracheal Asp Ctx 6/18>> MRSA Trach Asp 6/25 > MRSA  ANTIBIOTICS: Rocephin 6/14 >> 6/19 Azithromycin 6/14 >> 6/19  Cefepime 6/19 >  6/21 Vancomycin 6/19 > 6/28  SIGNIFICANT EVENTS: 6/14  Admission to [facility], intubation, transfer to [facility] 6/23  Tolerated pressure support 15/5  6/26  Weaning on PSV   LINES/TUBES: ETT 6/14 >> 6/27 RUE TL PICC 6/17 >> Trach 6/27 (DF) >>   ASSESSMENT / PLAN:  PULMONARY A: Acute hypoxic respiratory failure from MRSA PNA, ARDS. Tracheostomy Status - 6/27 per DF Hx of OSA. P:   PRVC 8 cc/kg  Increased PEEP/Fio2 yesterday. Try to wean down today PRN BDs Trach care per protocol  Mucomyst nebs  CARDIOVASCULAR A:  Severe sepsis from PNA. Hx of HTN. P:  Neo gtt for MAP >65 Wean pressors to off  Follow CVP Tele monitoring   RENAL A:   Hypernatremia - resolved P:   Monitor renal fx, urine outpt, electrolytes. Started on lasix today. Replete K.  GASTROINTESTINAL A:   Nutrition. Hx of Hepatitis C. Ileus > resolved P:   Restart tube feeds Abx x ray in AM Protonix for  SUP  HEMATOLOGIC/ONCOLOGIC A:   Anemia of critical illness and chronic disease. Hx of Rt renal cancer. P:  F/u CBC Heparin [place]  Transfuse per ICU guidelines   INFECTIOUS A:   Severe sepsis 2nd to MRSA PNA. Finished 10 days of vanco yesterday (6/28) but now with fevers, elevated WBC count ? Thrush - noted on bronch for trach  P:   Consider zyvox if she spikes again Check C.diff and repeat cultures Diflucan 100 mg IV QD x7 days for thrush  ENDOCRINE A:   Hx DM Type 2 - BG controlled. Hx Pituitary Tumor - S/P Resection. P:   SSI with levemir  NEUROLOGIC A:   Acute metabolic encephalopathy. Hx of anxiety, depression. Hx of chronic pain s/p spinal cord stimulator. P:   RASS goal: 0 to -1 Fentanyl gtt for pain, reduce ceiling to 200 mcg (has not needed) Lyrica bid VT.  Critical care time- 35 mins.  Marshell Garfinkel MD [facility] Pager [phone] If no answer or after 3pm call: [phone] 09/23/2015, 10:18 AM

## 2015-09-23 NOTE — Progress Notes (Signed)
RT held 0400 CPT due to patients increased respiratory rate and patient being very uncomfortable.

## 2015-09-23 NOTE — Progress Notes (Signed)
Patient extremely tachypneic, sats dropping into the 80's. Elink called and new orders given. Will continue to monitor.

## 2015-09-23 NOTE — Progress Notes (Signed)
eLink Physician-Brief Progress Note Patient Name: [place]ACA ISLAND DOB: 1945/08/28 MRN: [id number]   Date of Service  09/23/2015  HPI/Events of Note  k low crt good  eICU Interventions  k runs     Intervention Category Intermediate Interventions: Electrolyte abnormality - evaluation and management  Raylene Miyamoto. 09/23/2015, 6:59 PM

## 2015-09-23 NOTE — Progress Notes (Signed)
eLink Physician-Brief Progress Note Patient Name: Rebecca Richards DOB: 1945/10/01 MRN: [id number]   Date of Service  09/23/2015  HPI/Events of Note  P[xr with chf, ards Was pos 200 cc today despite lasix 40 q12h  eICU Interventions  Increase lasix to NEGATIVE BALANCE     Intervention Category Major Interventions: Hypotension - evaluation and management  FEINSTEIN,DANIEL J. 09/23/2015, 5:45 PM

## 2015-09-24 ENCOUNTER — Inpatient Hospital Stay (HOSPITAL_COMMUNITY): Payer: Medicare HMO

## 2015-09-24 DIAGNOSIS — G8929 Other chronic pain: Secondary | ICD-10-CM | POA: Diagnosis present

## 2015-09-24 DIAGNOSIS — M1990 Unspecified osteoarthritis, unspecified site: Secondary | ICD-10-CM | POA: Diagnosis present

## 2015-09-24 DIAGNOSIS — J189 Pneumonia, unspecified organism: Secondary | ICD-10-CM | POA: Diagnosis not present

## 2015-09-24 DIAGNOSIS — B1920 Unspecified viral hepatitis C without hepatic coma: Secondary | ICD-10-CM | POA: Diagnosis present

## 2015-09-24 DIAGNOSIS — G629 Polyneuropathy, unspecified: Secondary | ICD-10-CM

## 2015-09-24 DIAGNOSIS — J939 Pneumothorax, unspecified: Secondary | ICD-10-CM | POA: Diagnosis not present

## 2015-09-24 DIAGNOSIS — F419 Anxiety disorder, unspecified: Secondary | ICD-10-CM | POA: Diagnosis present

## 2015-09-24 DIAGNOSIS — M545 Low back pain: Secondary | ICD-10-CM

## 2015-09-24 DIAGNOSIS — D649 Anemia, unspecified: Secondary | ICD-10-CM | POA: Diagnosis present

## 2015-09-24 LAB — BLOOD GAS, ARTERIAL
ACID-BASE EXCESS: 1.5 mmol/L (ref 0.0–2.0)
ACID-BASE EXCESS: 0.9 mmol/L (ref 0.0–2.0)
Acid-Base Excess: 6.9 mmol/L — ABNORMAL HIGH (ref 0.0–2.0)
Acid-base deficit: 6.2 mmol/L — ABNORMAL HIGH (ref 0.0–2.0)
BICARBONATE: 31.3 meq/L — AB (ref 20.0–24.0)
BICARBONATE: 27.4 meq/L — AB (ref 20.0–24.0)
Bicarbonate: 23.5 mEq/L (ref 20.0–24.0)
Bicarbonate: 28 mEq/L — ABNORMAL HIGH (ref 20.0–24.0)
DRAWN BY: 441261
Drawn by: 308601
FIO2: 0.8
FIO2: 1
FIO2: 1
FIO2: 1
LHR: 14 {breaths}/min
LHR: 36 {breaths}/min
LHR: 36 {breaths}/min
MECHVT: 450 mL
MECHVT: 450 mL
MECHVT: 450 mL
O2 SAT: 87.4 %
O2 Saturation: 96.2 %
O2 Saturation: 98.1 %
O2 Saturation: 98.4 %
PATIENT TEMPERATURE: 98.6
PCO2 ART: 45.9 mmHg — AB (ref 35.0–45.0)
PEEP/CPAP: 12 cmH2O
PEEP: 10 cmH2O
PEEP: 12 cmH2O
PEEP: 12 cmH2O
PH ART: 7.449 (ref 7.350–7.450)
PO2 ART: 128 mmHg — AB (ref 80.0–100.0)
PO2 ART: 182 mmHg — AB (ref 80.0–100.0)
Patient temperature: 37
Patient temperature: 98.9
Patient temperature: 99.4
RATE: 28 resp/min
TCO2: 28.8 mmol/L (ref 0–100)
TCO2: 23.2 mmol/L (ref 0–100)
TCO2: 26.4 mmol/L (ref 0–100)
TCO2: 25.7 mmol/L (ref 0–100)
VT: 450 mL
pCO2 arterial: 73.4 mmHg (ref 35.0–45.0)
pCO2 arterial: 57.6 mmHg (ref 35.0–45.0)
pCO2 arterial: 57.6 mmHg (ref 35.0–45.0)
pH, Arterial: 7.132 — CL (ref 7.350–7.450)
pH, Arterial: 7.309 — ABNORMAL LOW (ref 7.350–7.450)
pH, Arterial: 7.302 — ABNORMAL LOW (ref 7.350–7.450)
pO2, Arterial: 54.3 mmHg — ABNORMAL LOW (ref 80.0–100.0)
pO2, Arterial: 156 mmHg — ABNORMAL HIGH (ref 80.0–100.0)

## 2015-09-24 LAB — CBC
HEMATOCRIT: 30.3 % — AB (ref 36.0–46.0)
HEMOGLOBIN: 9.6 g/dL — AB (ref 12.0–15.0)
MCH: 27.4 pg (ref 26.0–34.0)
MCHC: 31.7 g/dL (ref 30.0–36.0)
MCV: 86.6 fL (ref 78.0–100.0)
Platelets: 645 10*3/uL — ABNORMAL HIGH (ref 150–400)
RBC: 3.5 MIL/uL — AB (ref 3.87–5.11)
RDW: 16.5 % — ABNORMAL HIGH (ref 11.5–15.5)
WBC: 19 10*3/uL — ABNORMAL HIGH (ref 4.0–10.5)

## 2015-09-24 LAB — CULTURE, BLOOD (ROUTINE X 2)
Culture: NO GROWTH
Culture: NO GROWTH

## 2015-09-24 LAB — GLUCOSE, CAPILLARY
GLUCOSE-CAPILLARY: 250 mg/dL — AB (ref 65–99)
Glucose-Capillary: 144 mg/dL — ABNORMAL HIGH (ref 65–99)
Glucose-Capillary: 74 mg/dL (ref 65–99)
Glucose-Capillary: 117 mg/dL — ABNORMAL HIGH (ref 65–99)
Glucose-Capillary: 400 mg/dL — ABNORMAL HIGH (ref 65–99)

## 2015-09-24 LAB — BASIC METABOLIC PANEL
Anion gap: 9 (ref 5–15)
BUN: 15 mg/dL (ref 6–20)
CHLORIDE: 100 mmol/L — AB (ref 101–111)
CO2: 31 mmol/L (ref 22–32)
Calcium: 8.6 mg/dL — ABNORMAL LOW (ref 8.9–10.3)
Creatinine, Ser: 0.83 mg/dL (ref 0.44–1.00)
GFR calc Af Amer: >60 mL/min (ref >60)
GFR calc non Af Amer: >60 mL/min (ref >60)
GLUCOSE: 146 mg/dL — AB (ref 65–99)
POTASSIUM: 3.8 mmol/L (ref 3.5–5.1)
Sodium: 140 mmol/L (ref 135–145)

## 2015-09-24 LAB — MAGNESIUM: Magnesium: 1.9 mg/dL (ref 1.7–2.4)

## 2015-09-24 LAB — URINE CULTURE
Culture: 60000 — AB
SPECIAL REQUESTS: NORMAL

## 2015-09-24 LAB — C DIFFICILE QUICK SCREEN W PCR REFLEX
C DIFFICILE (CDIFF) INTERP: NEGATIVE
C Diff antigen: NEGATIVE
C Diff toxin: NEGATIVE

## 2015-09-24 LAB — TROPONIN I: Troponin I: 0.05 ng/mL (ref <0.03)

## 2015-09-24 LAB — PHOSPHORUS: Phosphorus: 2.9 mg/dL (ref 2.5–4.6)

## 2015-09-24 MED ORDER — LABETALOL HCL 5 MG/ML IV SOLN
10.0000 mg | INTRAVENOUS | Status: DC | PRN
  Filled 2015-09-24: qty 4

## 2015-09-24 MED ORDER — SODIUM BICARBONATE 8.4 % IV SOLN
INTRAVENOUS | Status: AC
  Administered 2015-09-24: 50 meq
  Filled 2015-09-24: qty 50

## 2015-09-24 MED ORDER — VITAL HIGH PROTEIN PO LIQD
1000.0000 mL | ORAL | Status: DC
  Administered 2015-09-25: 1000 mL
  Filled 2015-09-24 (×2): qty 1000

## 2015-09-24 MED ORDER — DEXTROSE 5 % IV SOLN
1.0000 g | Freq: Three times a day (TID) | INTRAVENOUS | Status: DC
  Administered 2015-09-24 – 2015-09-25 (×3): 1 g via INTRAVENOUS
  Filled 2015-09-24 (×5): qty 1

## 2015-09-24 MED ORDER — LORAZEPAM 2 MG/ML IJ SOLN
2.0000 mg | INTRAMUSCULAR | Status: DC | PRN
  Administered 2015-09-24 – 2015-09-25 (×2): 2 mg via INTRAVENOUS
  Filled 2015-09-24 (×2): qty 1

## 2015-09-24 MED ORDER — LINEZOLID 600 MG/300ML IV SOLN
600.0000 mg | Freq: Two times a day (BID) | INTRAVENOUS | Status: DC
  Administered 2015-09-24 – 2015-09-26 (×5): 600 mg via INTRAVENOUS
  Filled 2015-09-24 (×5): qty 300

## 2015-09-24 MED FILL — Medication: Qty: 1 | Status: AC

## 2015-09-24 NOTE — Progress Notes (Signed)
CPT held at this time due to pt condition.  RN aware and in agreement.

## 2015-09-24 NOTE — Progress Notes (Addendum)
CXR demonstrates large right sided pneumothorax.  Emergent right sided chest tube placed per Dr. Vaughan Browner.  Sutured in place.  1-3 intermittent air leak post placement.  Repeat CXR with residual pneumothorax.  Suction increased to 30 cm.  ABG reviewed.   Plan: Repeat CXR at 1330  Adjust vent rate to 36 Repeat ABG at [place], NP-C [facility] Pgr: [phone] or if no answer [phone] 09/24/2015, 12:29 PM

## 2015-09-24 NOTE — Progress Notes (Signed)
Son updated via phone on events of the morning.  Questions answered.  He plans to visit this PM after work and over the weekend.  Discussed the concept of CPR / ACLS.  He has never had conversations with his mother regarding the issue but feels she would want all measures that would lend to a reasonable recovery / quality of life, including CPR if needed.  He does not think she would want prolonged ventilation / facility living if she was unaware of her surroundings or unable to recover.   Noe Gens, NP-C [facility] Pgr: [phone] or if no answer [phone] 09/24/2015, 1:31 PM

## 2015-09-24 NOTE — Progress Notes (Addendum)
eLink Physician-Brief Progress Note Patient Name: Rebecca Richards DOB: [birth year] MRN: [id number]   Date of Service  09/24/2015  HPI/Events of Note  Drop in BP Hypoxia Tachy R/o PTX increase? Collapse? bolus  eICU Interventions  Stat abg sta pcr  I have had extensive discussions with family son. We discussed patients current circumstances and organ failures. We also discussed patient's prior wishes under circumstances such as this. Family has decided to NOT perform resuscitation if arrest but to continue current medical support for now.      Intervention Category Major Interventions: Code management / supervision;End of life / care limitation discussion  Raylene Miyamoto. 09/24/2015, 4:40 PM

## 2015-09-24 NOTE — Progress Notes (Addendum)
Nutrition Follow-up  DOCUMENTATION CODES:   Obesity unspecified  INTERVENTION:  - Will change TF regimen given change in Propofol rate: Vital High Protein @ 45 mL/hr without Prostat. This + kcal from Propofol will provide 1138 kcal, 95 grams of protein, and 903 mL free water.  - Continue PEPuP protocol.  - RD will follow-up 7/2.  NUTRITION DIAGNOSIS:   Inadequate oral intake related to inability to eat as evidenced by NPO status. -ongoing  GOAL:   Provide needs based on ASPEN/SCCM guidelines -met with current orders.  MONITOR:   Vent status, TF tolerance, Weight trends, Skin, I & O's  ASSESSMENT:   70 y.o. female with medical history significant of HTN, tobacco abuse, DM type 2, OSA on CPAP, presented to the ED via ambulance with complaints of shortness of breath for the last 3 days which significantly worsened last night. She reports an associated cough with production of white sputum as well as chest soreness with deep inhalation, wheeze, and slight fever. She denies any pain while coughing.  6/30 S/p bronch this AM. RN reports code currently occuring. RD will continue to monitor. Flowsheets indicate no Propofol infusing this AM. Will adjust TF this afternoon if appropriate given current status.   Current TF order for Vital High Protein @ 20 mL/hr with 30 mL Prostat TID and 50 mL free water every 4 hours which is providing 780 kcal, 87 grams of protein, and 701 mL free water.    ADDENDUM: Pt s/p chest tube placement earlier today d/t pneumothorax. Pt remains on vent via trach with NGT in place. She is currently receiving Propofol @ 2.2 mL/hr (58 kcal).   Will change TF regimen as outlined above given current Propofol rate/kcal and have RD follow-up 7/2 given events of this AM and pt's critical condition.   Medications reviewed; 80 mg IV Lasix every 6 hours, sliding scale Novolog, 10 units Levemir/day, 40 mg Protonix/day. Labs reviewed; Cl: 100 mmol/L, Ca: 8.6 mg/dL.      6/29 - Pt continues with NGT and receiving goal rate of TF this AM: Vital High Protein @ 45 mL/hr which is providing 1080 kcal, 95 grams of protein, and 903 mL free water. - RN reports no issues with TF. She states bowel sounds have been faint and mainly heard in R quadrant but that pt did have BM x2 yesterday.  - Patient is currently intubated on ventilator support; MV: 11.2 L/min; Temp (24hrs), Max:102.8 F (39.3 C); Propofol: 15.4 ml/hr (407 kcal). - Estimated nutrition needs from yesterday remain the same today.  - Per chart review, weight up 1 lb since yesterday (161 lbs today).  - Will adjust TF regimen due to start of Propofol and the kcal provided by this medication: Vital High Protein @ 20 mL/hr with 30 mL Prostat TID. This regimen + kcal from Propofol will provide 1187 kcal, 87 grams of protein, and 401 mL free water.    Drips: Propofol @ 35 mcg/kg/min, Neo @ 20 mcg/min, Fentanyl @ 175 mcg/hr.   6/28 - New consult for TF initiation and management.  - Spoke with RN who confirms no longer concern for ileus. NGT in place.  - Pt s/p trach placement yesterday afternoon, connected to vent.  - Tongue continues to be swollen.  - Nutrition needs re-estimated. - Patient is currently intubated on ventilator support; MV: 14 L/min; Temp (24hrs), Max:103 F (39.4 C); Propofol: none - Weight overall stable since admission (160-165 lbs mainly).   Drips: Neo @ 5 mcg/min, Precedex @  0.3 mcg/kg/min, Fentanyl @ 175 mcg/hr.  IVF: D5 now d/c'ed.     Diet Order:   NPO  Skin:  Reviewed, no issues  Last BM:  6/29  Height:   Ht Readings from Last 1 Encounters:  09/13/2015 '5\' 1"'  (1.549 m)    Weight:   Wt Readings from Last 1 Encounters:  09/24/15 157 lb 6.5 oz (71.4 kg)    Ideal Body Weight:  47.73 kg (kg)  BMI:  Body mass index is 29.76 kg/(m^2).  Estimated Nutritional Needs:   Kcal:  1050-1193 (22-25 kcal/kg IBW)  Protein:  87-109 grams (1.2-1.5 grams/kg actual body  weight)  Fluid:  > 1L/day  EDUCATION NEEDS:   No education needs identified at this time     Jarome Matin, MS, RD, LDN Inpatient Clinical Dietitian Pager # [phone] After hours/weekend pager # [phone]

## 2015-09-24 NOTE — Progress Notes (Signed)
Rhythm change from tachycardia to bradycardia with peaked T waves.  Increased peak pressures noted on vent to 70's and patient not overbreathing where she previously had been.  Bilateral breath sounds confirmed.  Unable to obtain SpO2 waveform. Temp 98.9 oral.  Femoral pulses confirmed with doppler.  Propofol / fentanyl gtt's turned off   Plan: Now 1 amp bicarbonate STAT CXR to ensure no PTX Continue O2 to 100%, PEEP 12   Rebecca Gens, NP-C [facility] Pgr: [phone] or if no answer [phone] 09/24/2015, 11:23 AM

## 2015-09-24 NOTE — Progress Notes (Signed)
Pharmacy Antibiotic Note  Rebecca Richards is a 70 y.o. female admitted on 09/14/2015 with community-acquired pneumonia initially treated with Ceftriaxone and Azithromycin, subsequently changed to Cefepime and Vancomycin due to fever, concern for VAP/HCAP. Antibiotics then de-escalated to Vancomycin alone to complete 10 day course once sputum cultures grew out MRSA. Patient has had persistent pulmonary infiltrates, fever, and leukocytosis. ID consulted, and patient started on Zyvox and Cefepime therapy. Pharmacy has been consulted for Cefepime dosing  Plan: Cefepime 1g IV q8h. Continue Zyvox 600mg  IV q12h per MD. Continue to monitor renal function, cultures, clinical course.  Height: 5\' 1"  (154.9 cm) Weight: 157 lb 6.5 oz (71.4 kg) IBW/kg (Calculated) : 47.8  Temp (24hrs), Avg:100.9 F (38.3 C), Min:99.1 F (37.3 C), Max:102.3 F (39.1 C)   Recent Labs Lab 09/19/15 1120  09/21/15 0310 09/22/15 0300 09/22/15 1017 09/23/15 0422 09/23/15 1743 09/24/15 0540  WBC  --   < > 14.3* 12.5* 13.0* 17.6*  --  19.0*  CREATININE  --   --  0.65 0.79 0.71 0.82 0.73 0.83  VANCOTROUGH 9*  --   --   --   --   --   --   --   < > = values in this interval not displayed.  Estimated Creatinine Clearance: 59.4 mL/min (by C-G formula based on Cr of 0.83).    Allergies  Allergen Reactions  . Lactose Intolerance (Gi)     Antimicrobials this admission: Azithromycin/CTX 6/14 >> 6/19 Vanc 6/19 >> 6/28 Cefepime 6/19 >> 6/21, 6/30 >> Fluconazole 6/28 >> (7/4) Zyvox 6/30 >>   Microbiology results: 6/14 BCx: NGF 6/14 UCx: NGF  6/14 strep pneumo: neg (legionella ordered, collected 6/19 - neg) 6/15 trach asp: NGF 6/14 MRSA PCR: + 6/14 HIV ab: NR 6/18 Trach asp cx: MRSA 6/19 Legionella: neg 6/25 BCx: NGF 6/25 UCx: 20K yeast 6/25 Trach asp cx: abundant MRSA 6/29 BCx: NGTD 6/29 UCx: 60K yeast 6/29 Trach asp cx: few Staph aureus 6/30 Trach asp cx: IP 6/30 Acid Fast smear, culture: sent 6/30  C.diff PCR: negative   Thank you for allowing pharmacy to be a part of this patient's care.   Lindell Spar, PharmD, BCPS Pager: [phone] 09/24/2015 2:02 PM

## 2015-09-24 NOTE — Progress Notes (Signed)
eLink Physician-Brief Progress Note Patient Name: Rebecca Richards DOB: 22-Mar-1946 MRN: [id number]   Date of Service  09/24/2015  HPI/Events of Note  Called for high fevers, tylenol limited reduction Add cooling blanket Pupils less reactive I am concerned for neuro injury during todays events, hypoperfusion   eICU Interventions  eeg was ordered already Ativan add Cooling blanket I do not feel CT head will alter her management or outcome Son is on way up from caf Will discuss pt status and consider comfort care      Intervention Category Major Interventions: End of life / care limitation discussion  Raylene Miyamoto. 09/24/2015, 8:47 PM

## 2015-09-24 NOTE — Progress Notes (Signed)
PULMONARY / CRITICAL CARE MEDICINE   Name: Rebecca Richards MRN: [id number] DOB: [birth year]    ADMISSION DATE:  09/07/2015 CONSULTATION DATE:  09/13/2015  REFERRING MD:  Kathie Dike, M.D. / [facility]  CHIEF COMPLAINT:  Dyspnea  BRIEF:  70 y/o female with DM2 with ARDS and CAP initially admitted to [facility] on 6/14.  Developed MRSA pneumonia 6/18.  SUBJECTIVE:   Sedation increased overnight due to agitation >> on 120 mcg neo, 295mcg fentany, 45 propofol.  Febrile to 102.   VITAL SIGNS: BP 78/54 mmHg  Pulse 117  Temp(Src) 101.4 F (38.6 C) (Oral)  Resp 23  Ht 5\' 1"  (1.549 m)  Wt 157 lb 6.5 oz (71.4 kg)  BMI 29.76 kg/m2  SpO2 100%  HEMODYNAMICS: CVP:  [10 mmHg-16 mmHg] 15 mmHg  VENTILATOR SETTINGS: Vent Mode:  [-] PRVC FiO2 (%):  [70 %-80 %] 80 % Set Rate:  [14 bmp] 14 bmp Vt Set:  [450 mL] 450 mL PEEP:  [10 cmH20] 10 cmH20 Plateau Pressure:  [20 cmH20-35 cmH20] 28 cmH20  INTAKE / OUTPUT:  Intake/Output Summary (Last 24 hours) at 09/24/15 0956 Last data filed at 09/24/15 0900  Gross per 24 hour  Intake 3849.06 ml  Output   5200 ml  Net -1350.94 ml   PHYSICAL EXAMINATION: General: No distress, on vent Neuro: Opens eyes, follows commands HEENT: trach in place, tongue edema / crusting Cardiac: RRR, No MRG Chest: Clear, no wheeze, crackles Abd: Soft, NT, ND Ext: no edema Skin: no rashes  LABS:  BMET  Recent Labs Lab 09/23/15 0422 09/23/15 1743 09/24/15 0540  NA 139 139 140  K 3.0* 2.7* 3.8  CL 101 105 100*  CO2 28 25 31   BUN 15 15 15   CREATININE 0.82 0.73 0.83  GLUCOSE 168* 190* 146*    Electrolytes  Recent Labs Lab 09/23/15 0422 09/23/15 1700 09/23/15 1743 09/24/15 0540  CALCIUM 8.1*  --  7.2* 8.6*  MG 2.5* 1.9  --  1.9  PHOS 3.4 3.0  --  2.9    CBC  Recent Labs Lab 09/22/15 1017 09/23/15 0422 09/24/15 0540  WBC 13.0* 17.6* 19.0*  HGB 9.0* 11.2* 9.6*  HCT 28.3* 34.6* 30.3*  PLT 591* 572* 645*    Coag's  Recent  Labs Lab 09/21/15 1220  INR 1.29    Sepsis Markers No results for input(s): LATICACIDVEN, PROCALCITON, O2SATVEN in the last 168 hours.  ABG  Recent Labs Lab 09/22/15 1645 09/23/15 1658 09/24/15 0318  PHART 7.371 7.380 7.449  PCO2ART 42.3 50.1* 45.9*  PO2ART 86.3 86.9 54.3*    Liver Enzymes  Recent Labs Lab 09/19/15 0245 09/19/15 0450 09/22/15 0300  AST  --   --  40  ALT  --   --  32  ALKPHOS  --   --  118  BILITOT  --   --  0.5  ALBUMIN 2.2* 2.0* 1.8*    Cardiac Enzymes No results for input(s): TROPONINI, PROBNP in the last 168 hours.  Glucose  Recent Labs Lab 09/23/15 0720 09/23/15 1158 09/23/15 1550 09/23/15 1939 09/23/15 2324 09/24/15 0400  GLUCAP 130* 168* 109* 183* 144* 74    Imaging Dg Chest Port 1 View  09/24/2015  CLINICAL DATA:  Respiratory failure. EXAM: PORTABLE CHEST 1 VIEW COMPARISON:  09/23/2015. FINDINGS: Tracheostomy tube and NG tube in stable position. Right PICC line in stable position. Heart size stable. Diffuse bilateral airspace disease again noted without interim change. Lucency noted over the right upper chest  most likely is related with skin fold. Neurostimulator noted in stable position. IMPRESSION: 1.  Lines and tubes in stable position. 2.  Diffuse bilateral airspace disease unchanged. Electronically Signed   By: Marcello Moores  Register   On: 09/24/2015 06:52   Dg Chest Port 1 View  09/23/2015  CLINICAL DATA:  Respiratory failure EXAM: PORTABLE CHEST 1 VIEW COMPARISON:  Study obtained earlier in the day FINDINGS: Tracheostomy catheter tip is 4.4 cm above the carina. Central catheter tip is in the superior vena cava. Nasogastric tube tip and side port are below the diaphragm. Stimulator leads are in the lower thoracic region. No pneumothorax. Widespread interstitial alveolar opacity remain throughout the lungs diffusely. Heart is slightly enlarged but stable. The pulmonary vascularity suggests mild pulmonary venous hypertension. No adenopathy  demonstrable on this portable radiographic examination. IMPRESSION: Widespread interstitial and alveolar opacity bilaterally. Question congestive heart failure with superimposed ARDS and/or pneumonia. Appearance remains stable compared to recent prior studies. Tube and catheter positions as described without pneumothorax. Electronically Signed   By: Lowella Grip III M.D.   On: 09/23/2015 17:34   Dg Chest Port 1 View  09/23/2015  CLINICAL DATA:  Respiratory failure. EXAM: PORTABLE CHEST 1 VIEW COMPARISON:  09/22/2015. FINDINGS: Tracheostomy tube, NG tube, right PICC line in stable position. Heart size stable. Diffuse bilateral pulmonary infiltrates/edema again noted, no interim change. No prominent pleural effusion. No pneumothorax . Neurostimulator in stable position. IMPRESSION: 1. Lines and tubes in stable position. 2. Diffuse bilateral pulmonary infiltrates/edema. No interim change. Electronically Signed   By: Marcello Moores  Register   On: 09/23/2015 07:18   Dg Chest Port 1 View  09/22/2015  CLINICAL DATA:  Hypoxia and tachypnea EXAM: PORTABLE CHEST 1 VIEW COMPARISON:  September 22, 2015 FINDINGS: Tracheostomy catheter tip is 4.2 cm above the carina. Central catheter tip is in the superior vena cava. Nasogastric tube tip and side port below the diaphragm. Thoracic stimulator tips are in the lower thoracic region, stable. No pneumothorax. There is extensive interstitial and alveolar pulmonary edema bilaterally. There is mild cardiac enlargement with pulmonary venous hypertension. No adenopathy is evident by portable radiography. IMPRESSION: Tube and catheter positions as described without pneumothorax. Extensive interstitial and alveolar edema, stable. The appearance is consistent with congestive heart failure. A degree of superimposed pneumonia and/ or ARDS cannot be excluded. More than one of these entities may exist concurrently. Overall there is no appreciable change compared to study obtained earlier in the  day. Electronically Signed   By: Lowella Grip III M.D.   On: 09/22/2015 17:27   Dg Abd Portable 1v  09/22/2015  CLINICAL DATA:  Acute respiratory failure.  Follow-up ileus. EXAM: PORTABLE ABDOMEN - 1 VIEW COMPARISON:  09/20/2015 abdominal radiograph FINDINGS: Enteric tube terminates in the distal body of the stomach. Partially visualized spinal stimulator device in the left abdomen with leads coursing into the thoracic spinal canal. Cholecystectomy clips are seen in the right upper quadrant of the abdomen. No dilated small bowel loops. Mild gaseous distention of the colon, significantly decreased. No evidence of pneumatosis or pneumoperitoneum. Patchy opacities at both lung bases are not appreciably changed. IMPRESSION: Nonobstructive bowel gas pattern.  Nearly resolved colonic ileus. Electronically Signed   By: Ilona Sorrel M.D.   On: 09/22/2015 10:38    STUDIES:  TTE 6/15 >> Moderate LVH with EF 70-75%. Normal regional wall motion. Pulmonary artery systolic pressure 46 mmHg. RV normal in size.  MICROBIOLOGY: MRSA PCR 6/14:  Positive Blood Ctx x2 6/14 >> ng Urine  Ctx 6/14:  Negative  Urine Strep Ag 6/14:  Negative Urine Legionella Ag 6/14: Negative Tracheal Asp Ctx 6/15:  Negative Tracheal Asp Ctx 6/18>> MRSA Trach Asp 6/25 > MRSA BCx2 6/29 >>  UC 6/29 >>  Sputum 6/29 >> abundant WBC, few GPC's in pairs >>   ANTIBIOTICS: Rocephin 6/14 >> 6/19 Azithromycin 6/14 >> 6/19  Cefepime 6/19 > 6/21 Vancomycin 6/19 > 6/28 Cefepime 6/30 >>  Zyvox 6/30 >>   SIGNIFICANT EVENTS: 6/14  Admission to [facility], intubation, transfer to [facility] 6/23  Tolerated pressure support 15/5  6/26  Weaning on PSV 6/28  Vent dyssynchrony, agitation overnight   LINES/TUBES: ETT 6/14 >> 6/27 RUE TL PICC 6/17 >> Trach 6/27 (DF) >>   ASSESSMENT / PLAN:  PULMONARY A: Acute hypoxic respiratory failure from MRSA PNA, ARDS. Tracheostomy Status - 6/27 per DF Hx of OSA. P:   PRVC 8  cc/kg  Wean PEEP / FiO2 for sats > 90% PRN BDs Trach care per protocol  Mucomyst nebs  CARDIOVASCULAR A:  Severe sepsis from PNA. Hx of HTN. P:  Neo gtt for MAP >65 Wean pressors to off as able  Follow CVP Tele monitoring  Lasix 80 mg IV Q6  RENAL A:   Hypernatremia - resolved P:   Monitor renal fx, urine outpt, electrolytes. Started on lasix today. Replete K.  GASTROINTESTINAL A:   Nutrition. Hx of Hepatitis C. Ileus > resolved P:   Restart tube feeds Protonix for SUP  HEMATOLOGIC/ONCOLOGIC A:   Anemia of critical illness and chronic disease. Hx of Rt renal cancer. P:  F/u CBC Heparin [place]  Transfuse per ICU guidelines   INFECTIOUS A:   Severe sepsis 2nd to MRSA PNA. Finished 10 days of vanco yesterday (6/28) but now with fevers, elevated WBC count ? Thrush - noted on bronch for trach  P:   Resume abx 6/30, zyvox & cefepime  Check C.diff and repeat cultures 6/29 Diflucan 100 mg IV QD x7 days for thrush ID consulted, appreciate input  ENDOCRINE A:   Hx DM Type 2 - BG controlled. Hx Pituitary Tumor - S/P Resection. P:   SSI with levemir  NEUROLOGIC A:   Acute metabolic encephalopathy. Hx of anxiety, depression. Hx of chronic pain s/p spinal cord stimulator. P:   RASS goal: 0 to -1 Fentanyl gtt for pain Propofol for sedation  Lyrica bid VT.    Noe Gens, NP-C [place] [facility] Pgr: [phone] or if no answer [phone] 09/24/2015, 9:56 AM

## 2015-09-24 NOTE — Procedures (Signed)
Chest Tube Insertion Procedure Note  Indications:  Clinically significant right pneumothorax  Pre-operative Diagnosis: Pneumothorax  Post-operative Diagnosis: Pneumothorax  Procedure Details  Informed consent was obtained for the procedure, including sedation.  Risks of lung perforation, hemorrhage, arrhythmia, and adverse drug reaction were discussed.   After sterile skin prep, using standard technique, a wayne cathter in right side. Immediatly after insertion a gush of air was felt. Tube connected to pleurovac and connected to suction.  Findings: None  Estimated Blood Loss:  Minimal         Specimens:  None              Complications:  None; patient tolerated the procedure well.  Marshell Garfinkel MD  Pulmonary and Critical Care Pager [phone] If no answer or after 3pm call: [phone] 09/24/2015, 12:43 PM

## 2015-09-24 NOTE — Progress Notes (Signed)
CPT to be held until patient is stable.

## 2015-09-24 NOTE — Progress Notes (Signed)
CPT held at this time due to patient's critical condition at this time. RT will continue to monitor patient.

## 2015-09-24 NOTE — Consult Note (Signed)
[name] for Infectious Disease    Date of Admission:  09/09/2015   Total days of antibiotics 17        Day 3 fluconazole        Day 1 linezolid        Day 1 cefepime       Reason for Consult: persistent pneumonia, fever and leukocytosis    Referring Provider: Noe Gens, NP  Principal Problem:   HCAP (healthcare-associated pneumonia) Active Problems:   Acute respiratory failure with hypoxemia ([place])   Pneumothorax on right   Depression   Hypertension   Tobacco abuse   OSA on CPAP   Diabetes mellitus type 2, uncontrolled ([place])   Obesity, unspecified   Dehydration   Lactic acidosis   Community acquired pneumonia   Acute kidney injury (nontraumatic) ([place])   Hepatitis C   Normocytic anemia   DJD (degenerative joint disease)   Anxiety   Chronic low back pain   Peripheral neuropathy ([place])   . antiseptic oral rinse  7 mL Mouth Rinse QID  . ceFEPime (MAXIPIME) IV  1 g Intravenous Q8H  . chlorhexidine gluconate (SAGE KIT)  15 mL Mouth Rinse BID  . feeding supplement (PRO-STAT SUGAR FREE 64)  30 mL Per Tube TID  . feeding supplement (VITAL HIGH PROTEIN)  1,000 mL Per Tube Q24H  . fluconazole (DIFLUCAN) IV  100 mg Intravenous Q24H  . free water  50 mL Per Tube Q4H  . furosemide  80 mg Intravenous Q6H  . heparin subcutaneous  5,000 Units Subcutaneous Q8H  . insulin aspart  0-15 Units Subcutaneous Q4H  . insulin detemir  10 Units Subcutaneous Q2200  . linezolid  600 mg Intravenous Q12H  . pantoprazole sodium  40 mg Per Tube Q24H  . potassium chloride  40 mEq Per Tube BID  . pregabalin  100 mg Per Tube Daily  . pregabalin  150 mg Per Tube QHS  . sodium chloride flush  10-40 mL Intracatheter Q12H    Recommendations: 1. Agree with empiric linezolid and cefepime pending repeat cultures   Assessment: Ms. Labo is likely to have either persistent MRSA pneumonia or superimposed healthcare associated pneumonia. While she was on vancomycin her trough levels  were highly variable ranging from supratherapeutic at 31 to sub-therapeutic at 9. I agree with empiric linezolid and cefepime. Dr. Linus Salmons [phone]) will followup tomorrow.  HPI: Rebecca Richards is a 70 y.o. female smoker with diabetes and obstructive sleep apnea who was admitted to [facility] on 08/29/2015 following 3 days of fever and productive cough. She was found to be septic with community-acquired pneumonia and transferred here for ICU care. She was initially treated for community-acquired pneumonia with ceftriaxone and azithromycin until sputum cultures grew MRSA. She received 10 days of vancomycin therapy completing treatment on 09/22/2015. She has remained ventilator dependent. She has persistent pulmonary infiltrates with purulent secretions, fever and leukocytosis. She underwent diagnostic bronchoscopy this morning and has developed a right pneumothorax.   Review of Systems: Review of Systems  Unable to perform ROS: intubated    Past Medical History  Diagnosis Date  . Brain tumor [facility])     Surgery for benign brain tumor in the remote past  . Hypertension   . Preop cardiovascular exam     Cardiac clearance for knee surgery, January, 2013  . Edema   . Tobacco abuse   . Ejection fraction     EF 70%, echo,  February, 2013  . Sleep apnea     uses Cpap  . Hepatitis 1970's    hep c  . Anxiety   . 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 Nasal congestion   . Diabetes mellitus without complication ([place])   . Insomnia   . Spinal cord stimulator status     for pain   . Chronic knee pain   . Chronic back pain     with radiculopathy bilat legs  . Depression   . Arthritis   . Cancer ([place]) 2006    renal Rt  . Renal disorder     was diagnosed with kidney CA but tumor shrinked without intervention    Social History  Substance Use Topics  . Smoking status: Former Smoker -- 0.50 packs/day for 10 years    Types: Cigarettes    Quit date: 12/26/2013  . Smokeless tobacco: Never  Used  . Alcohol Use: No     Comment: none since 03-28-2011    Family History  Problem Relation Age of Onset  . Cancer Mother   . Hypertension Mother   . Anesthesia problems Neg Hx   . Diabetes Mother   . Diabetes Brother    Allergies  Allergen Reactions  . Lactose Intolerance (Gi)     OBJECTIVE: Blood pressure 78/54, pulse 117, temperature 101.4 F (38.6 C), temperature source Oral, resp. rate 23, height '5\' 1"'  (1.549 m), weight 157 lb 6.5 oz (71.4 kg), SpO2 100 %.  Physical Exam  Constitutional:  I did not examine her as she is in the process of being resuscitated by the ICU team.    Lab Results Lab Results  Component Value Date   WBC 19.0* 09/24/2015   HGB 9.6* 09/24/2015   HCT 30.3* 09/24/2015   MCV 86.6 09/24/2015   PLT 645* 09/24/2015    Lab Results  Component Value Date   CREATININE 0.83 09/24/2015   BUN 15 09/24/2015   NA 140 09/24/2015   K 3.8 09/24/2015   CL 100* 09/24/2015   CO2 31 09/24/2015    Lab Results  Component Value Date   ALT 32 09/22/2015   AST 40 09/22/2015   ALKPHOS 118 09/22/2015   BILITOT 0.5 09/22/2015     Microbiology: Recent Results (from the past 240 hour(s))  Culture, Urine     Status: Abnormal   Collection Time: 09/19/15  2:51 AM  Result Value Ref Range Status   Specimen Description URINE, CATHETERIZED  Final   Special Requests NONE  Final   Culture 20,000 COLONIES/mL YEAST (A)  Final   Report Status 09/20/2015 FINAL  Final  Culture, blood (routine x 2)     Status: None (Preliminary result)   Collection Time: 09/19/15  2:55 AM  Result Value Ref Range Status   Specimen Description BLOOD LEFT ARM  Final   Special Requests BOTTLES DRAWN AEROBIC AND ANAEROBIC 5CC  Final   Culture   Final    NO GROWTH 4 DAYS Performed at [facility]    Report Status PENDING  Incomplete  Culture, respiratory (NON-Expectorated)     Status: None   Collection Time: 09/19/15  3:09 AM  Result Value Ref Range Status   Specimen  Description TRACHEAL ASPIRATE  Final   Special Requests NONE  Final   Gram Stain   Final    ABUNDANT WBC PRESENT, PREDOMINANTLY PMN FEW SQUAMOUS EPITHELIAL CELLS PRESENT MODERATE GRAM POSITIVE COCCI IN PAIRS IN CLUSTERS Performed at [facility]  Culture   Final    ABUNDANT METHICILLIN RESISTANT STAPHYLOCOCCUS AUREUS   Report Status 09/21/2015 FINAL  Final   Organism ID, Bacteria METHICILLIN RESISTANT STAPHYLOCOCCUS AUREUS  Final      Susceptibility   Methicillin resistant staphylococcus aureus - MIC*    CIPROFLOXACIN >=8 RESISTANT Resistant     ERYTHROMYCIN >=8 RESISTANT Resistant     GENTAMICIN <=0.5 SENSITIVE Sensitive     OXACILLIN >=4 RESISTANT Resistant     TETRACYCLINE <=1 SENSITIVE Sensitive     VANCOMYCIN <=0.5 SENSITIVE Sensitive     TRIMETH/SULFA <=10 SENSITIVE Sensitive     CLINDAMYCIN >=8 RESISTANT Resistant     RIFAMPIN <=0.5 SENSITIVE Sensitive     Inducible Clindamycin NEGATIVE Sensitive     * ABUNDANT METHICILLIN RESISTANT STAPHYLOCOCCUS AUREUS  Culture, blood (routine x 2)     Status: None (Preliminary result)   Collection Time: 09/19/15  3:50 AM  Result Value Ref Range Status   Specimen Description BLOOD LEFT HAND  Final   Special Requests AEROBIC BOTTLE ONLY [place]  Final   Culture   Final    NO GROWTH 4 DAYS Performed at [facility]    Report Status PENDING  Incomplete  Culture, Urine     Status: Abnormal   Collection Time: 09/23/15 10:59 AM  Result Value Ref Range Status   Specimen Description URINE, CATHETERIZED  Final   Special Requests Normal  Final   Culture 60,000 COLONIES/mL YEAST (A)  Final   Report Status 09/24/2015 FINAL  Final  Culture, respiratory (NON-Expectorated)     Status: None (Preliminary result)   Collection Time: 09/23/15 11:37 AM  Result Value Ref Range Status   Specimen Description TRACHEAL ASPIRATE  Final   Special Requests Normal  Final   Gram Stain   Final    ABUNDANT WBC PRESENT, PREDOMINANTLY PMN RARE  SQUAMOUS EPITHELIAL CELLS PRESENT FEW GRAM POSITIVE COCCI IN PAIRS IN CLUSTERS Performed at [facility]    Culture PENDING  Incomplete   Report Status PENDING  Incomplete    Michel Bickers, MD [place] for Infectious [place] [facility] [phone] pager   [phone] cell 09/24/2015, 12:26 PM

## 2015-09-24 NOTE — Progress Notes (Signed)
Pt expeirncing shaking spells throughout her whole upper extermities/torso and hypertension. Informed Dr. Titus Mould.  See orders.  Irven Baltimore, RN

## 2015-09-24 NOTE — Progress Notes (Signed)
CRITICAL VALUE ALERT  Critical value received:  Troponin .05  Date of notification:  09/24/2015  Time of notification:  [id number]  Critical value read back:Yes.    Nurse who received alert:  Jeannie Fend RN  MD notified (1st page):  Elink Dr. Titus Mould  Time of first page:  1610  MD notified (2nd page):  Time of second page:  Responding MD:  Dr Titus Mould  Time MD responded:  [phone]

## 2015-09-24 NOTE — Progress Notes (Signed)
CPT held at this time due to bronch. Will resume CPT at next scheduled time.

## 2015-09-24 NOTE — Procedures (Signed)
PCCM Bronchoscopy Procedure Note  The patient was informed of the risks (including but not limited to bleeding, infection, respiratory failure, lung injury, tooth/oral injury) and benefits of the procedure and gave consent, see chart.  Indication: Mucus plugs, pneumonia  Post Procedure Diagnosis: Same  Condition pre procedure: On vent  Procedure description: The bronchoscope was introduced through the tracheostomy and passed to the bilateral lungs to the level of the subsegmental bronchi throughout the tracheobronchial tree.  Airway exam revealed thin secretions, friable mucosa.    Procedures performed: Lavage performed on RLL and LLL.  Specimens sent: Cultures  Condition post procedure: Pt was agitated through the procedure with desats.  CXR ordered which showed large right pneumothorax. Emergent CT placed on right (see separate procedure note)  EBL: 0  Marshell Garfinkel MD [place] [facility] Pager [phone] If no answer or after 3pm call: [phone] 09/24/2015, 12:39 PM

## 2015-09-24 NOTE — Progress Notes (Signed)
Assessed patient at bedside post bronchoscopy.  Difficulty obtaining saturation reading, dyssynchronous on vent.  Pt removed from ventilator, BVM with peep valve used.  Sat probe changed.  Intermittent readings of 90's with poor waveform.  Questionable rhythm changes (ST change vs lead placement).    Plan: Asked RN to apply warm compress / towel to site and change sat probe Return to vent Now ABG Obtain EKG, troponin now then Q6 x3 total   Noe Gens, NP-C [place] [facility] Pgr: [phone] or if no answer [phone] 09/24/2015, 11:01 AM

## 2015-09-24 NOTE — Progress Notes (Signed)
eLink Physician-Brief Progress Note Patient Name: Rebecca Richards DOB: Mar 13, 1946 MRN: [id number]   Date of Service  09/24/2015  HPI/Events of Note  Trop neg Events e were secondary toptx which has been successfuly treated  eICU Interventions  Dc trop     Intervention Category Minor Interventions: Communication with other healthcare providers and/or family;Routine modifications to care plan (e.g. PRN medications for pain, fever)  Raylene Miyamoto. 09/24/2015, 4:09 PM

## 2015-09-24 NOTE — Progress Notes (Signed)
Pt tachycardiac, hypoxic, and hypotensive.  Cut off sedatives and informed Elink.  See orders.  Irven [place], RN

## 2015-09-24 NOTE — Progress Notes (Signed)
eLink Physician-Brief Progress Note Patient Name: CHALESE WRUBEL DOB: 07-28-1945 MRN: [id number]   Date of Service  09/24/2015  HPI/Events of Note  Now HTN, slight shaking per Rn on right, i camera in , Idont see this on exam now   eICU Interventions  h  labetol eeg If able would CT head, too unstable now     Intervention Category Major Interventions: Hypertension - evaluation and management  Sameerah Nachtigal J. 09/24/2015, 5:36 PM

## 2015-09-25 ENCOUNTER — Inpatient Hospital Stay (HOSPITAL_COMMUNITY): Payer: Medicare HMO

## 2015-09-25 DIAGNOSIS — J939 Pneumothorax, unspecified: Secondary | ICD-10-CM

## 2015-09-25 LAB — BASIC METABOLIC PANEL
ANION GAP: 17 — AB (ref 5–15)
BUN: 49 mg/dL — ABNORMAL HIGH (ref 6–20)
CHLORIDE: 99 mmol/L — AB (ref 101–111)
CO2: 23 mmol/L (ref 22–32)
Calcium: 7.7 mg/dL — ABNORMAL LOW (ref 8.9–10.3)
Creatinine, Ser: 3.83 mg/dL — ABNORMAL HIGH (ref 0.44–1.00)
GFR calc Af Amer: 13 mL/min — ABNORMAL LOW (ref >60)
GFR calc non Af Amer: 11 mL/min — ABNORMAL LOW (ref >60)
GLUCOSE: 268 mg/dL — AB (ref 65–99)
POTASSIUM: 4.3 mmol/L (ref 3.5–5.1)
Sodium: 139 mmol/L (ref 135–145)

## 2015-09-25 LAB — BLOOD GAS, ARTERIAL
ACID-BASE DEFICIT: 5.1 mmol/L — AB (ref 0.0–2.0)
ACID-BASE DEFICIT: 2.5 mmol/L — AB (ref 0.0–2.0)
BICARBONATE: 23.1 meq/L (ref 20.0–24.0)
Bicarbonate: 24.3 mEq/L — ABNORMAL HIGH (ref 20.0–24.0)
DRAWN BY: 257701
Drawn by: 257701
FIO2: 1
FIO2: 0.8
LHR: 36 {breaths}/min
MECHVT: 450 mL
MECHVT: 450 mL
O2 SAT: 98.3 %
O2 Saturation: 94.7 %
PATIENT TEMPERATURE: 104.5
PCO2 ART: 71.7 mmHg — AB (ref 35.0–45.0)
PCO2 ART: 60.7 mmHg — AB (ref 35.0–45.0)
PEEP/CPAP: 10 cmH2O
PEEP: 10 cmH2O
PH ART: 7.158 — AB (ref 7.350–7.450)
PH ART: 7.239 — AB (ref 7.350–7.450)
PO2 ART: 220 mmHg — AB (ref 80.0–100.0)
Patient temperature: 102
RATE: 36 resp/min
TCO2: 22.2 mmol/L (ref 0–100)
TCO2: 23.3 mmol/L (ref 0–100)
pO2, Arterial: 94.3 mmHg (ref 80.0–100.0)

## 2015-09-25 LAB — GLUCOSE, CAPILLARY
Glucose-Capillary: 293 mg/dL — ABNORMAL HIGH (ref 65–99)
Glucose-Capillary: 225 mg/dL — ABNORMAL HIGH (ref 65–99)
Glucose-Capillary: 176 mg/dL — ABNORMAL HIGH (ref 65–99)
Glucose-Capillary: 364 mg/dL — ABNORMAL HIGH (ref 65–99)

## 2015-09-25 LAB — CULTURE, RESPIRATORY W GRAM STAIN: Special Requests: NORMAL

## 2015-09-25 LAB — CULTURE, RESPIRATORY

## 2015-09-25 LAB — TRIGLYCERIDES: Triglycerides: 81 mg/dL (ref <150)

## 2015-09-25 LAB — CBC
HEMATOCRIT: 32.3 % — AB (ref 36.0–46.0)
HEMOGLOBIN: 10.2 g/dL — AB (ref 12.0–15.0)
MCH: 28.3 pg (ref 26.0–34.0)
MCHC: 31.6 g/dL (ref 30.0–36.0)
MCV: 89.5 fL (ref 78.0–100.0)
Platelets: 588 10*3/uL — ABNORMAL HIGH (ref 150–400)
RBC: 3.61 MIL/uL — ABNORMAL LOW (ref 3.87–5.11)
RDW: 16.8 % — ABNORMAL HIGH (ref 11.5–15.5)
WBC: 24.6 10*3/uL — ABNORMAL HIGH (ref 4.0–10.5)

## 2015-09-25 MED ORDER — DEXTROSE 5 % IV SOLN
1.0000 g | INTRAVENOUS | Status: DC
  Administered 2015-09-26: 1 g via INTRAVENOUS
  Filled 2015-09-25: qty 1

## 2015-09-25 MED ORDER — SODIUM BICARBONATE 8.4 % IV SOLN
INTRAVENOUS | Status: DC
  Administered 2015-09-25 (×2): via INTRAVENOUS
  Filled 2015-09-25 (×2): qty 150

## 2015-09-25 MED ORDER — SODIUM BICARBONATE 8.4 % IV SOLN
50.0000 meq | Freq: Once | INTRAVENOUS | Status: AC
  Administered 2015-09-25: 50 meq via INTRAVENOUS

## 2015-09-25 MED ORDER — SODIUM BICARBONATE 8.4 % IV SOLN
INTRAVENOUS | Status: AC
  Filled 2015-09-25: qty 50

## 2015-09-25 MED ORDER — NOREPINEPHRINE BITARTRATE 1 MG/ML IV SOLN
2.0000 ug/min | INTRAVENOUS | Status: DC
  Administered 2015-09-25 (×2): 20 ug/min via INTRAVENOUS
  Filled 2015-09-25 (×3): qty 4

## 2015-09-25 MED ORDER — VITAL HIGH PROTEIN PO LIQD
1000.0000 mL | ORAL | Status: DC
  Administered 2015-09-25: 1000 mL
  Filled 2015-09-25: qty 1000

## 2015-09-25 MED ORDER — NOREPINEPHRINE BITARTRATE 1 MG/ML IV SOLN
2.0000 ug/min | INTRAVENOUS | Status: DC
  Administered 2015-09-25 – 2015-09-26 (×2): 20 ug/min via INTRAVENOUS
  Filled 2015-09-25 (×2): qty 16

## 2015-09-25 MED ORDER — SODIUM BICARBONATE 8.4 % IV SOLN
INTRAVENOUS | Status: AC
Start: 2015-09-25 — End: 2015-09-25
  Filled 2015-09-25: qty 50

## 2015-09-25 NOTE — Progress Notes (Signed)
RT help Q4 CPT- PT unstable.

## 2015-09-25 NOTE — Progress Notes (Signed)
CH met w/pt's son, cousin and friends. Son was appropriately tearful as he is adjusting to the thoughts of losing his mother. He immediately asked for prayer and found comfort in that w/the support of those around him. CH offered continued support as needed. Please page [phone] °Chaplain Pamela Carrington Holder, M.Div. ° ° 09/25/15 1100  °Clinical Encounter Type  °Visited With Family  ° °

## 2015-09-25 NOTE — Progress Notes (Signed)
eLink Physician-Brief Progress Note Patient Name: Rebecca Richards DOB: 03-31-1945 MRN: [id number]   Date of Service  09/25/2015  HPI/Events of Note  F/u abg relatively better. 7.23/61/94 on a rate of 36 and 8 ml/kr TV  Pt looks comfortable  eICU Interventions  Will hold off further vent changes.         [place] 09/25/2015, 9:32 PM

## 2015-09-25 NOTE — Progress Notes (Signed)
Pharmacy Antibiotic Note  Rebecca Richards is a 70 y.o. female admitted on 08/27/2015 with community-acquired pneumonia initially treated with Ceftriaxone and Azithromycin, subsequently changed to Cefepime and Vancomycin due to fever, concern for VAP/HCAP. Antibiotics then de-escalated to Vancomycin alone to complete 10 day course once sputum cultures grew out MRSA. Patient has had persistent pulmonary infiltrates, fever, and leukocytosis. ID consulted, and patient started on Zyvox and Cefepime therapy. Pharmacy has been consulted for Cefepime dosing  Plan: Adjust Cefepime to 1g IV q24h for worsening renal indices. Continue to monitor renal function, cultures, clinical course.  Height: 5\' 1"  (154.9 cm) Weight: 160 lb 7.9 oz (72.8 kg) IBW/kg (Calculated) : 47.8  Temp (24hrs), Avg:104.3 F (40.2 C), Min:98.8 F (37.1 C), Max:107.1 F (41.7 C)   Recent Labs Lab 09/19/15 1120  09/22/15 0300 09/22/15 1017 09/23/15 0422 09/23/15 1743 09/24/15 0540 09/25/15 0350 09/25/15 0925  WBC  --   < > 12.5* 13.0* 17.6*  --  19.0* 24.6*  --   CREATININE  --   < > 0.79 0.71 0.82 0.73 0.83  --  3.83*  VANCOTROUGH 9*  --   --   --   --   --   --   --   --   < > = values in this interval not displayed.  Estimated Creatinine Clearance: 13 mL/min (by C-G formula based on Cr of 3.83).    Allergies  Allergen Reactions  . Lactose Intolerance (Gi)     Antimicrobials this admission: Azithromycin/CTX 6/14 >> 6/19 Vanc 6/19 >> 6/28 Cefepime 6/19 >> 6/21, 6/30 >> Fluconazole 6/28 >> (7/4) Zyvox 6/30 >>   Microbiology results: 6/14 BCx: NGF 6/14 UCx: NGF  6/14 strep pneumo: neg (legionella ordered, collected 6/19 - neg) 6/15 trach asp: NGF 6/14 MRSA PCR: + 6/14 HIV ab: NR 6/18 Trach asp cx: MRSA 6/19 Legionella: neg 6/25 BCx: NGF 6/25 UCx: 20K yeast 6/25 Trach asp cx: abundant MRSA 6/29 BCx: NGTD 6/29 UCx: 60K yeast 6/29 Trach asp cx: few MRSA 6/30 Trach asp cx: few Pseudomonas  aeruginosa 6/30 Acid Fast smear, culture: sent 6/30 C.diff PCR: negative   Thank you for allowing pharmacy to be a part of this patient's care.   Lindell Spar, PharmD, BCPS Pager: [phone] 09/25/2015 3:03 PM

## 2015-09-25 NOTE — Progress Notes (Signed)
RT help Q4 CPT during 0800 visit- PT unstable.

## 2015-09-25 NOTE — Progress Notes (Signed)
Dr Corrie Dandy ordered tube feeds to be at 110mL/hr.  Irven Baltimore, RN

## 2015-09-25 NOTE — Progress Notes (Signed)
RT held Q4 CPT- PT unstable.

## 2015-09-25 NOTE — Progress Notes (Signed)
PULMONARY / CRITICAL CARE MEDICINE   Name: Rebecca Richards MRN: [id number] DOB: 02-Jan-1946    ADMISSION DATE:  09/20/2015 CONSULTATION DATE:  09/12/2015  REFERRING MD:  Kathie Dike, M.D. / [facility]  CHIEF COMPLAINT:  Dyspnea  BRIEF:  70 y/o female with DM2 with ARDS and CAP initially admitted to [facility] on 6/14.  Developed MRSA pneumonia 6/18.  SIGNIFICANT EVENTS: 6/14  Admission to [facility], intubation, transfer to [facility] 6/23  Tolerated pressure support 15/5  6/26  Weaning on PSV 6/28  Vent dyssynchrony, agitation overnight 6/30 bronch , rt tension pnthx >> chest tube  SUBJECTIVE:   Events noed over last 24h High gr febrile 107 On neo @ max dose 400 mcg/h this am ! Came emergently to bedside   VITAL SIGNS: BP 65/34 mmHg  Pulse 117  Temp(Src) 104.5 F (40.3 C) (Oral)  Resp 36  Ht 5\' 1"  (1.549 m)  Wt 160 lb 7.9 oz (72.8 kg)  BMI 30.34 kg/m2  SpO2 100%  HEMODYNAMICS: CVP:  [13 mmHg-14 mmHg] 13 mmHg  VENTILATOR SETTINGS: Vent Mode:  [-] PRVC FiO2 (%):  [100 %] 100 % Set Rate:  [36 bmp] 36 bmp Vt Set:  [450 mL] 450 mL PEEP:  [12 cmH20] 12 cmH20 Plateau Pressure:  [27 cmH20-36 cmH20] 27 cmH20  INTAKE / OUTPUT:  Intake/Output Summary (Last 24 hours) at 09/25/15 0933 Last data filed at 09/25/15 0700  Gross per 24 hour  Intake 2961.08 ml  Output   1620 ml  Net 1341.08 ml   PHYSICAL EXAMINATION: General: No distress, on vent Neuro: unresponsive HEENT: trach in place, tongue edema / crusting Cardiac: RRR, No MRG Chest: Clear, no wheeze, crackles Abd: Soft, NT, ND Ext: no edema Skin: no rashes  LABS:  BMET  Recent Labs Lab 09/23/15 0422 09/23/15 1743 09/24/15 0540  NA 139 139 140  K 3.0* 2.7* 3.8  CL 101 105 100*  CO2 28 25 31   BUN 15 15 15   CREATININE 0.82 0.73 0.83  GLUCOSE 168* 190* 146*    Electrolytes  Recent Labs Lab 09/23/15 0422 09/23/15 1700 09/23/15 1743 09/24/15 0540  CALCIUM 8.1*  --  7.2* 8.6*   MG 2.5* 1.9  --  1.9  PHOS 3.4 3.0  --  2.9    CBC  Recent Labs Lab 09/23/15 0422 09/24/15 0540 09/25/15 0350  WBC 17.6* 19.0* 24.6*  HGB 11.2* 9.6* 10.2*  HCT 34.6* 30.3* 32.3*  PLT 572* 645* 588*    Coag's  Recent Labs Lab 09/21/15 1220  INR 1.29    Sepsis Markers No results for input(s): LATICACIDVEN, PROCALCITON, O2SATVEN in the last 168 hours.  ABG  Recent Labs Lab 09/24/15 1223 09/24/15 1530 09/24/15 1718  PHART 7.132* 7.309* 7.302*  PCO2ART 73.4* 57.6* 57.6*  PO2ART 128* 156* 182*    Liver Enzymes  Recent Labs Lab 09/19/15 0245 09/19/15 0450 09/22/15 0300  AST  --   --  40  ALT  --   --  32  ALKPHOS  --   --  118  BILITOT  --   --  0.5  ALBUMIN 2.2* 2.0* 1.8*    Cardiac Enzymes  Recent Labs Lab 09/24/15 1208  TROPONINI 0.05*    Glucose  Recent Labs Lab 09/24/15 0400 09/24/15 0740 09/24/15 1639 09/24/15 2014 09/24/15 2354 09/25/15 0825  GLUCAP 74 117* 400* 293* 250* 176*    Imaging Dg Chest 1 View  09/24/2015  CLINICAL DATA:  Decrease in vital  signs. EXAM: CHEST 1 VIEW COMPARISON:  Radiograph of same day. FINDINGS: Tracheostomy and nasogastric tubes are unchanged. Diffuse opacification of left lung is noted consistent with edema or inflammation. Large right pneumothorax is now noted. Right-sided PICC line is unchanged in position. Bony thorax is unremarkable. IMPRESSION: Stable support apparatus. Diffuse left lung opacity is noted concerning for edema or inflammation. Large right pneumothorax is now noted. I called the patient's floor and spoke to the unit secretary, who stated that Dr. Wonda Amis is aware of the pneumothorax and that they are currently resuscitating the patient in the room. Electronically Signed   By: Marijo Conception, M.D.   On: 09/24/2015 11:44   Dg Chest Port 1 View  09/25/2015  CLINICAL DATA:  Acute respiratory failure EXAM: PORTABLE CHEST 1 VIEW COMPARISON:  09/24/2015 FINDINGS: Tracheostomy tube, nasogastric  catheter and right chest tube are again identified and stable. No right-sided pneumothorax is seen. Stable bilateral airspace disease is noted no new focal infiltrate is seen. No bony abnormality is noted. IMPRESSION: No recurrent pneumothorax.  No change from the prior exam. Electronically Signed   By: Inez Catalina M.D.   On: 09/25/2015 07:09   Dg Chest Port 1 View  09/24/2015  CLINICAL DATA:  Pneumonia.  Low O2 sats.  Hypoxia. EXAM: PORTABLE CHEST 1 VIEW COMPARISON:  September 24, 2015 FINDINGS: Support apparatus is stable. No pneumothorax. Increased interstitial markings in the lungs, likely edema, is stable. The cardiomediastinal silhouette is unchanged. No other acute interval changes. IMPRESSION: Stable support apparatus.  Stable edema. Electronically Signed   By: Dorise Bullion III M.D   On: 09/24/2015 17:34   Dg Chest Port 1 View  09/24/2015  CLINICAL DATA:  Acute respiratory failure. Followup right pneumothorax. Acute respiratory failure with hypoxia. EXAM: PORTABLE CHEST 1 VIEW COMPARISON:  09/24/2015 FINDINGS: Right pigtail thoracostomy tube remains in place with tip in the right upper hemithorax. A tiny residual approximately 5% right apical pneumothorax noted. Diffuse bilateral pulmonary infiltrates show no significant change. Tracheostomy tube, nasogastric tube, and thoracic spinal canal neurostimulator leads remain in place. Heart size and mediastinal contours are stable. IMPRESSION: Tiny less than 5% right apical pneumothorax with right thoracostomy tube in place. Stable diffuse bilateral pulmonary infiltrates. Electronically Signed   By: Earle Gell M.D.   On: 09/24/2015 13:39   Dg Chest Port 1 View  09/24/2015  CLINICAL DATA:  Post chest tube insertion EXAM: PORTABLE CHEST 1 VIEW COMPARISON:  Portable exam 1136 hours compared to 09/24/2015 and 1116 hours FINDINGS: Tracheostomy tube, nasogastric tube, and RIGHT arm PICC line unchanged. External pacing leads project over chest. Interval  placement of pigtail RIGHT thoracostomy tube with tip near apex. Intraspinal stimulator leads noted. Near complete re-expansion of RIGHT lung with marked reduction of previously identified RIGHT pneumothorax, tiny residual pneumothorax noted laterally and inferiorly. Diffuse BILATERAL pulmonary infiltrates again seen. No pleural effusion. Bones demineralized. IMPRESSION: Near complete resolution of RIGHT pneumothorax following thoracostomy tube placement with minimal residual RIGHT pneumothorax. Severe diffuse BILATERAL airspace infiltrates. Electronically Signed   By: Lavonia Dana M.D.   On: 09/24/2015 12:30   Dg Chest Port 1 View  09/24/2015  CLINICAL DATA:  Respiratory failure. EXAM: PORTABLE CHEST 1 VIEW COMPARISON:  09/23/2015. FINDINGS: Tracheostomy tube and NG tube in stable position. Right PICC line in stable position. Heart size stable. Diffuse bilateral airspace disease again noted without interim change. Lucency noted over the right upper chest most likely is related with skin fold. Neurostimulator noted in stable position.  IMPRESSION: 1.  Lines and tubes in stable position. 2.  Diffuse bilateral airspace disease unchanged. Electronically Signed   By: Marcello Moores  Register   On: 09/24/2015 06:52   Dg Chest Port 1 View  09/23/2015  CLINICAL DATA:  Respiratory failure EXAM: PORTABLE CHEST 1 VIEW COMPARISON:  Study obtained earlier in the day FINDINGS: Tracheostomy catheter tip is 4.4 cm above the carina. Central catheter tip is in the superior vena cava. Nasogastric tube tip and side port are below the diaphragm. Stimulator leads are in the lower thoracic region. No pneumothorax. Widespread interstitial alveolar opacity remain throughout the lungs diffusely. Heart is slightly enlarged but stable. The pulmonary vascularity suggests mild pulmonary venous hypertension. No adenopathy demonstrable on this portable radiographic examination. IMPRESSION: Widespread interstitial and alveolar opacity bilaterally.  Question congestive heart failure with superimposed ARDS and/or pneumonia. Appearance remains stable compared to recent prior studies. Tube and catheter positions as described without pneumothorax. Electronically Signed   By: Lowella Grip III M.D.   On: 09/23/2015 17:34    STUDIES:  TTE 6/15 >> Moderate LVH with EF 70-75%. Normal regional wall motion. Pulmonary artery systolic pressure 46 mmHg. RV normal in size.  MICROBIOLOGY: MRSA PCR 6/14:  Positive Blood Ctx x2 6/14 >> ng Urine Ctx 6/14:  Negative  Urine Strep Ag 6/14:  Negative Urine Legionella Ag 6/14: Negative Tracheal Asp Ctx 6/15:  Negative Tracheal Asp Ctx 6/18>> MRSA Trach Asp 6/25 > MRSA BCx2 6/29 >>  UC 6/29 >>  Sputum 6/29 >>staph >> c diff 6/30 neg   ANTIBIOTICS: Rocephin 6/14 >> 6/19 Azithromycin 6/14 >> 6/19  Cefepime 6/19 > 6/21 Vancomycin 6/19 > 6/28 Cefepime 6/30 >>  Zyvox 6/30 >>      LINES/TUBES: ETT 6/14 >> 6/27 RUE TL PICC 6/17 >> Trach 6/27 (DF) >>   ASSESSMENT / PLAN:  PULMONARY A: Acute hypoxic respiratory failure from MRSA PNA, ARDS. Tracheostomy Status - 6/27 per DF Hx of OSA. P:   PRVC 8 cc/kg  Wean PEEP / FiO2 for sats > 90% -keep PEEP @ 10 PRN BDs Trach care per protocol  Mucomyst nebs  CARDIOVASCULAR A:  Severe sepsis from PNA. Hx of HTN. P:  Neo gtt for MAP >65 Add levo gtt Tele monitoring   hold lasix  RENAL A:   Hypernatremia - resolved P:   Monitor renal fx, urine outpt, electrolytes. Replete K.  GASTROINTESTINAL A:   Nutrition. Hx of Hepatitis C. Ileus > resolved P:   Hold  tube feeds Protonix for SUP  HEMATOLOGIC/ONCOLOGIC A:   Anemia of critical illness and chronic disease. Hx of Rt renal cancer. P:  F/u CBC Heparin [place]  Transfuse per ICU guidelines   INFECTIOUS A:   Severe sepsis 2nd to MRSA PNA. Finished 10 days of vanco yesterday (6/28) but now with fevers, elevated WBC count ? Thrush - noted on bronch for trach  P:   Resume abx  6/30, zyvox & cefepime  Await repeat cultures 6/29 Diflucan 100 mg IV QD x7 days for thrush ID consulted, appreciate input  ENDOCRINE A:   Hx DM Type 2 - BG controlled. Hx Pituitary Tumor - S/P Resection. P:   SSI with levemir  NEUROLOGIC A:   Acute metabolic encephalopathy. Hx of anxiety, depression. Hx of chronic pain s/p spinal cord stimulator. P:   RASS goal: 0 to -1 Fentanyl gtt for pain Dc Propofol- use versed prn Dc Lyrica   Summary - Worse shock, rt pnthorax resolved p-chest tube, will treat for  acidosis & staph pneumonia but prognosis grim- DNR issued after d/w son    cctime x 45 m  Kara Mead MD. FCCP. [facility] Pager [phone] If no response call [phone]    09/25/2015, 9:33 AM

## 2015-09-25 NOTE — Progress Notes (Signed)
[place] met pt's friend from childhood Berline Chough) outside of pt room. She said she just wanted to be there. She reported pt's son had gone home. She talked about how they grew up together and their families have been close for three generations. She talked about pt's son and how much he loves his mother. CH provided listening support and prayer as she asked for prayer for pt's son. Pt's son is an only child and she spoke of how difficult this will be for him. She spoke of how he is having a difficult time making decisions for her. Please page when son arrives for continued support. [place], M.Div.   09/25/15 0100  Clinical Encounter Type  Visited With Family

## 2015-09-25 NOTE — Progress Notes (Signed)
Change in pt heart rhythm noticed.  RN called and notified pt family of change. Will continue to monitor.

## 2015-09-25 DEATH — deceased

## 2015-09-26 ENCOUNTER — Inpatient Hospital Stay (HOSPITAL_COMMUNITY): Payer: Medicare HMO

## 2015-09-26 DIAGNOSIS — L8990 Pressure ulcer of unspecified site, unspecified stage: Secondary | ICD-10-CM | POA: Insufficient documentation

## 2015-09-26 LAB — GLUCOSE, CAPILLARY
Glucose-Capillary: 252 mg/dL — ABNORMAL HIGH (ref 65–99)
Glucose-Capillary: 358 mg/dL — ABNORMAL HIGH (ref 65–99)
Glucose-Capillary: 354 mg/dL — ABNORMAL HIGH (ref 65–99)
Glucose-Capillary: 410 mg/dL — ABNORMAL HIGH (ref 65–99)
Glucose-Capillary: 334 mg/dL — ABNORMAL HIGH (ref 65–99)

## 2015-09-26 LAB — CULTURE, RESPIRATORY

## 2015-09-26 LAB — CBC
HCT: 29.5 % — ABNORMAL LOW (ref 36.0–46.0)
HEMOGLOBIN: 9.4 g/dL — AB (ref 12.0–15.0)
MCH: 27.4 pg (ref 26.0–34.0)
MCHC: 31.9 g/dL (ref 30.0–36.0)
MCV: 86 fL (ref 78.0–100.0)
PLATELETS: 125 10*3/uL — AB (ref 150–400)
RBC: 3.43 MIL/uL — AB (ref 3.87–5.11)
RDW: 18 % — ABNORMAL HIGH (ref 11.5–15.5)
WBC: 21.8 10*3/uL — AB (ref 4.0–10.5)

## 2015-09-26 LAB — CULTURE, RESPIRATORY W GRAM STAIN

## 2015-09-26 MED ORDER — STERILE WATER FOR INJECTION IV SOLN
INTRAVENOUS | Status: DC
  Administered 2015-09-26: 05:00:00 via INTRAVENOUS
  Filled 2015-09-26: qty 850

## 2015-09-27 LAB — BLOOD GAS, ARTERIAL
ACID-BASE DEFICIT: 1.9 mmol/L (ref 0.0–2.0)
Bicarbonate: 24.4 mEq/L — ABNORMAL HIGH (ref 20.0–24.0)
Drawn by: 422461
FIO2: 0.6
MECHVT: 450 mL
O2 Saturation: 84.7 %
PCO2 ART: 59.6 mmHg — AB (ref 35.0–45.0)
PEEP: 10 cmH2O
PH ART: 7.252 — AB (ref 7.350–7.450)
PO2 ART: 65.8 mmHg — AB (ref 80.0–100.0)
Patient temperature: 103
RATE: 36 resp/min
TCO2: 23.5 mmol/L (ref 0–100)

## 2015-09-28 LAB — CULTURE, BLOOD (ROUTINE X 2)
CULTURE: NO GROWTH
CULTURE: NO GROWTH

## 2015-09-29 ENCOUNTER — Ambulatory Visit: Payer: Medicare HMO | Admitting: Orthopaedic Surgery

## 2015-09-29 LAB — ACID FAST SMEAR (AFB, MYCOBACTERIA)

## 2015-09-29 LAB — ACID FAST SMEAR (AFB): ACID FAST SMEAR - AFSCU2: NEGATIVE

## 2015-09-30 ENCOUNTER — Telehealth: Payer: Self-pay

## 2015-09-30 NOTE — Telephone Encounter (Signed)
On 09/30/2015 I received a death certificate from [facility] (faxed). The death certificate is for cremation. The patient is a patient of Doctor Elsworth Soho. The death certificate will be taken to [facility] tomorrow (09-30-2015) pm for signature. On 10/20/15 I received the death certificate back from Doctor Elsworth Soho. I got the death certificate ready and faxed the death certificate over to the funeral home per the funeral home request.

## 2015-10-04 ENCOUNTER — Telehealth: Payer: Self-pay

## 2015-10-04 NOTE — Telephone Encounter (Signed)
On 10/04/2015 I received a death certificate from [facility] (original). The death certificate is for cremation. The patient is a patient of Doctor Elsworth Soho. The death certificate will be taken to [facility] (2100) next Monday (10/11/15) because Doctor Elsworth Soho signed the faxed copy and he is on vacation this week. On 10/14/2015 I received the death certificate back from Doctor Elsworth Soho. I got the death certificate ready and mailed the death certificate to the [facility] Dept per the funeral home request.

## 2015-10-26 NOTE — Progress Notes (Signed)
Pt pronounced dead at 0924.  Was unable to find pulse with doppler. Listened for one minute and was unable to hear a heartbeat.  Bri Mccnabb RN listened and was unable to find heart beat.  Strip printed.  [facility] notified and the case number is [id number].  Dr. Elsworth Soho made aware and he came by the pt room.  This nurse called pt son to notify of passing.  Irven Baltimore, RN

## 2015-10-26 NOTE — Progress Notes (Signed)
260cc of fentanyl wasted in sink with Gae Gallop, RN.

## 2015-10-26 NOTE — Progress Notes (Signed)
CPT held at 0800- 52/31 BP- too unstable at this time.

## 2015-10-26 NOTE — Progress Notes (Signed)
CPT not done this round due to Pt's unstable blood pressure.  RT will continue to monitor as needed.  RN aware

## 2015-10-26 NOTE — Discharge Summary (Signed)
PULMONARY / CRITICAL CARE MEDICINE   Name: Rebecca Richards MRN: [id number] DOB: [birth year]    ADMISSION DATE:  08/27/2015 CONSULTATION DATE:  09/18/2015  REFERRING MD:  Kathie Dike, M.D. / AP [facility]  CHIEF COMPLAINT:  Dyspnea  BRIEF:  70 y/o female with DM2 with ARDS and CAP initially admitted to [facility] on 6/14.  Developed MRSA pneumonia 6/18.  SIGNIFICANT EVENTS: 6/14  Admission to [facility], intubation, transfer to [facility] 6/23  Tolerated pressure support 15/5  6/26  Weaning on PSV 6/28  Vent dyssynchrony, agitation overnight 6/30 bronch , rt tension pnthx >> chest tube 7/1 High gr febrile 107 ,On neo @ max dose 400 mcg/h this am !  STUDIES:  TTE 6/15 >> Moderate LVH with EF 70-75%. Normal regional wall motion. Pulmonary artery systolic pressure 46 mmHg. RV normal in size.  MICROBIOLOGY: MRSA PCR 6/14:  Positive Blood Ctx x2 6/14 >> ng Urine Ctx 6/14:  Negative  Urine Strep Ag 6/14:  Negative Urine Legionella Ag 6/14: Negative Tracheal Asp Ctx 6/15:  Negative Tracheal Asp Ctx 6/18>> MRSA Trach Asp 6/25 > MRSA BCx2 6/29 >> ng Sputum 6/29 >>MRSA c diff 6/30 neg  resp 6/30 >> pseudomonas pan S  ANTIBIOTICS: Rocephin 6/14 >> 6/19 Azithromycin 6/14 >> 6/19  Cefepime 6/19 > 6/21 Vancomycin 6/19 > 6/28 Cefepime 6/30 >>  Zyvox 6/30 >>      LINES/TUBES: ETT 6/14 >> 6/27 RUE TL PICC 6/17 >> Trach 6/27 (DF) >>   ASSESSMENT / PLAN:  PULMONARY A: Acute hypoxic respiratory failure from MRSA PNA, ARDS. Tracheostomy Status - 6/27 per DF Hx of OSA. 6/30 Rt pneumothorax s/p chest tube P:   PRVC 8 cc/kg  Wean PEEP / FiO2 for sats > 90% -keep PEEP @ 10 PRN BDs Trach care per protocol  Mucomyst nebs  CARDIOVASCULAR A:  Severe sepsis from PNA. Hx of HTN. P:  Neo gtt for MAP >65 Added levo gtt Tele monitoring   hold lasix  GASTROINTESTINAL A:   Nutrition. Hx of Hepatitis C. Ileus > resolved P:   Hold  tube feeds Protonix for  SUP  HEMATOLOGIC/ONCOLOGIC A:   Anemia of critical illness and chronic disease. Hx of Rt renal cancer. P:  F/u CBC Heparin [place]  Transfuse per ICU guidelines   INFECTIOUS A:   Severe sepsis 2nd to MRSA PNA. Finished 10 days of vanco yesterday (6/28) but now with fevers, elevated WBC count ? Thrush - noted on bronch for trach  P:   Resume abx 6/30, zyvox & cefepime  Await repeat cultures 6/29 Diflucan 100 mg IV QD x7 days for thrush ID consulted, appreciate input  ENDOCRINE A:   Hx DM Type 2 - BG controlled. Hx Pituitary Tumor - S/P Resection. P:   SSI with levemir  NEUROLOGIC A:   Acute metabolic encephalopathy. Hx of anxiety, depression. Hx of chronic pain s/p spinal cord stimulator. P:   RASS goal: 0 to -1 Fentanyl gtt for pain Dc Propofol- use versed prn Dc Lyrica   COURSE - Worse shock, rt pnthorax resolved p-chest tube, treated for acidosis & staph pneumonia but worsened- DNR issued after d/w son , passed away on 09-29-2022  Cause of death - Septic shock, MRSA pneumonia, ARDS , DM-2    Kara Mead MD. FCCP. [facility]    09/30/2015, 1:04 AM

## 2015-10-26 DEATH — deceased

## 2015-11-10 LAB — ACID FAST CULTURE WITH REFLEXED SENSITIVITIES: ACID FAST CULTURE - AFSCU3: NEGATIVE

## 2017-11-29 IMAGING — CR DG CHEST 1V PORT
1 series · 1 of 1 positions shown · non-contrast
Comparison: 08/18/2014

CLINICAL DATA: Increasing shortness of breath for 3 days. History
of hypertension, diabetes, former smoker, right renal cell cancer.

EXAM:
PORTABLE CHEST 1 VIEW

[ap]
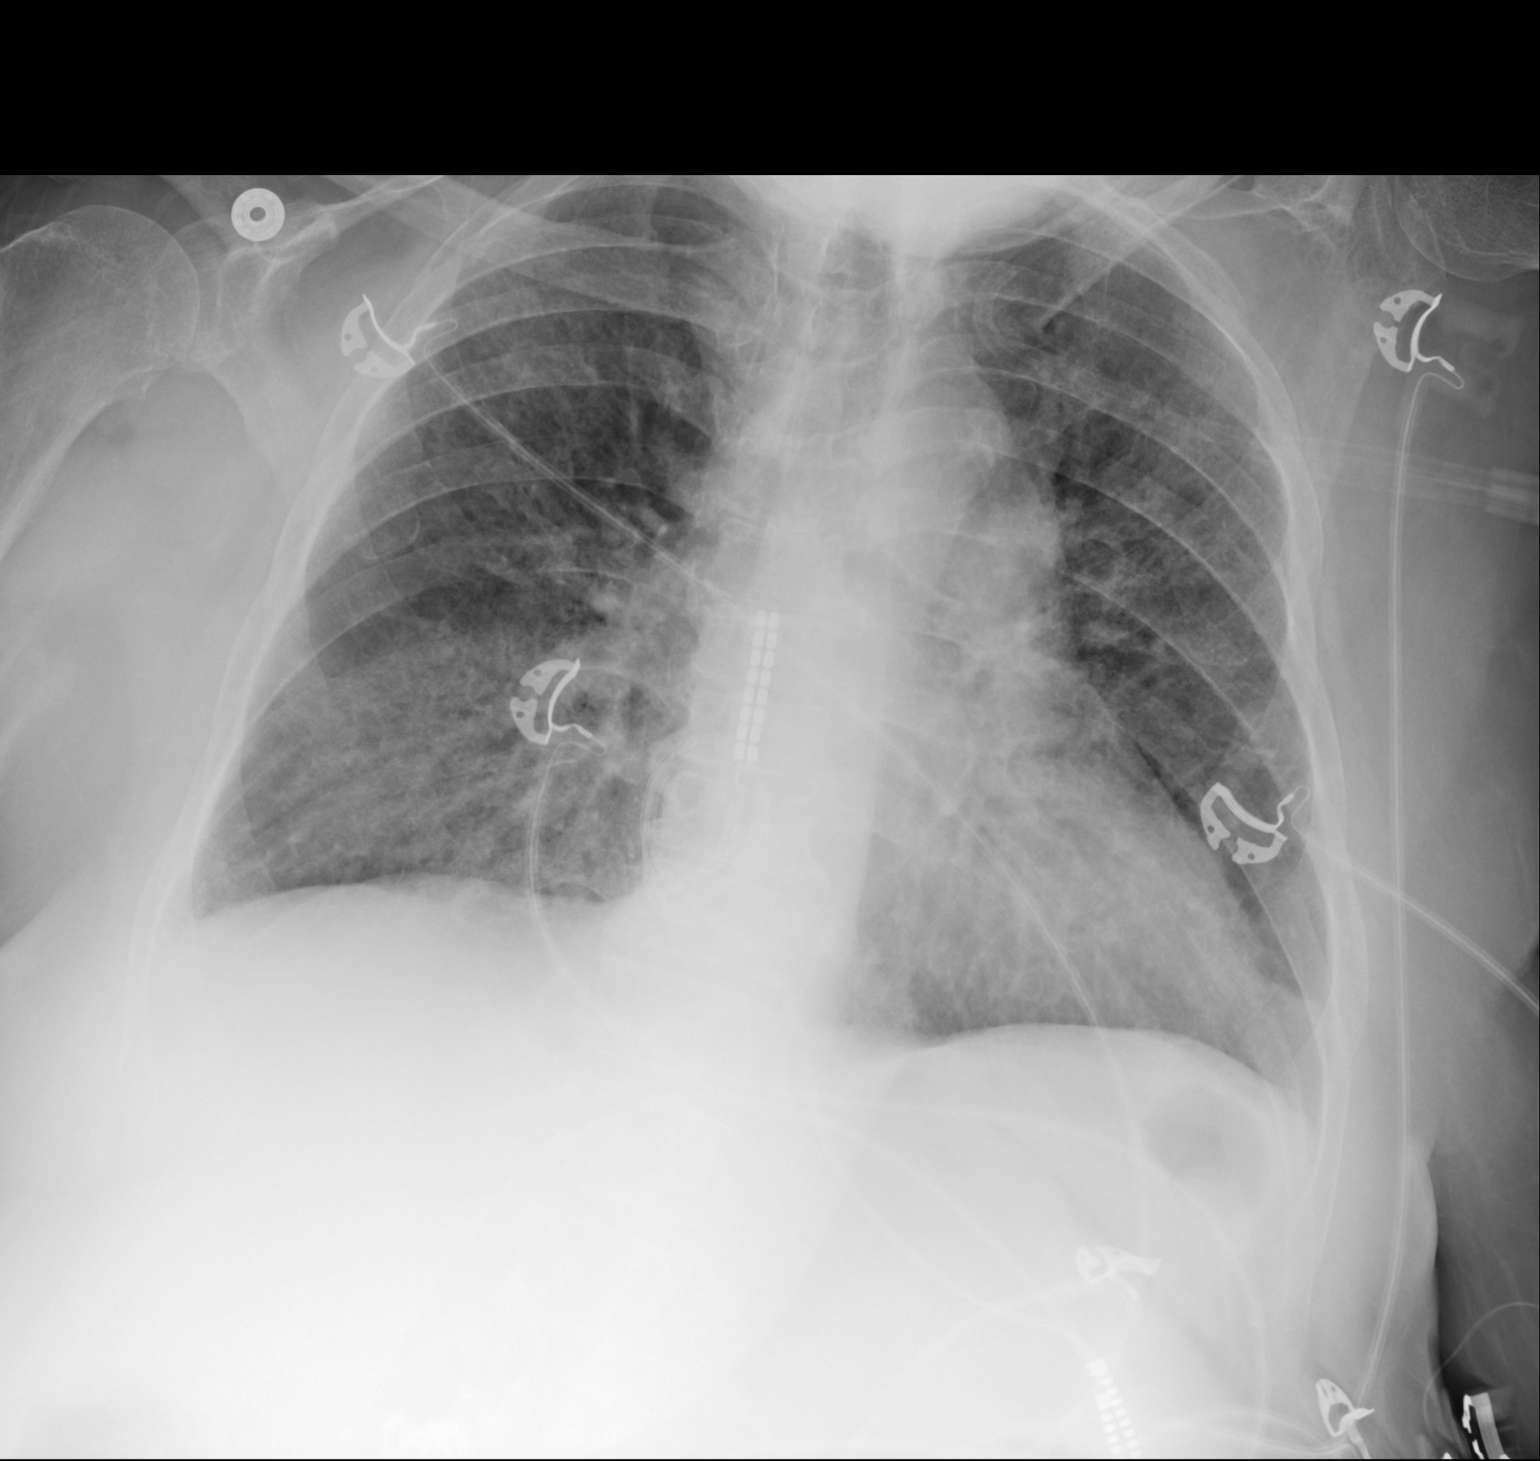

[1 of 1 positions shown; findings below may reference images not displayed]

FINDINGS: Normal heart size and pulmonary vascularity. Bilateral perihilar
infiltration greatest in the right lung base. This may indicate
edema or pneumonia. No blunting of costophrenic angles. No
pneumothorax. Calcification of the aorta. Spinal stimulator
projected over the mid thoracic region. Degenerative changes in the
spine and shoulders.
IMPRESSION: Bilateral perihilar and right basilar infiltrates may indicate edema
or pneumonia.

## 2017-11-29 IMAGING — CR DG CHEST 1V PORT
1 series · 1 of 1 positions shown · non-contrast
Comparison: Chest radiograph from earlier today.

CLINICAL DATA: Respiratory failure.  Intubated.

EXAM:
PORTABLE CHEST 1 VIEW

[ap portable]
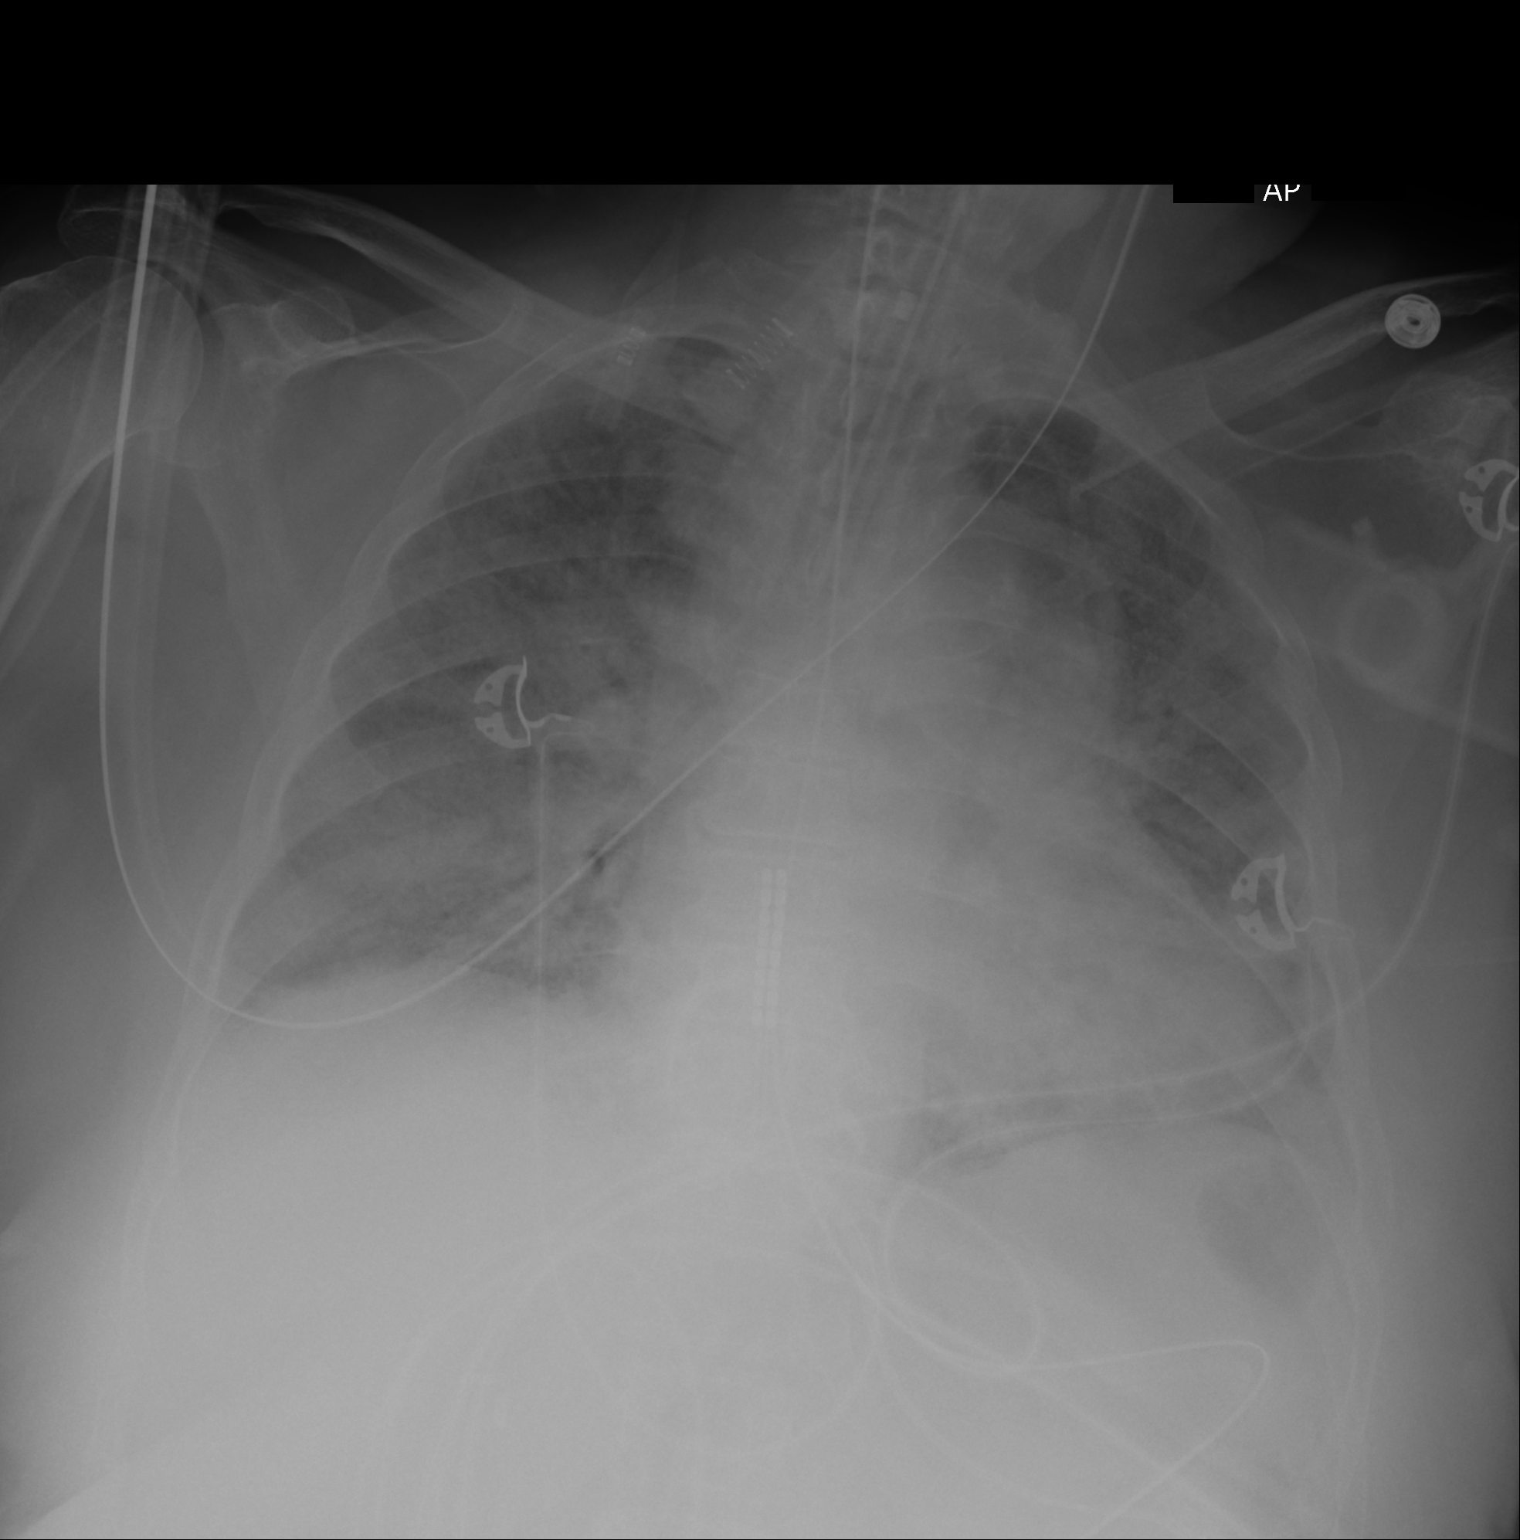

[1 of 1 positions shown; findings below may reference images not displayed]

FINDINGS: Endotracheal tube tip is 1.1 cm above the carina. Enteric tube
enters stomach with the tip not seen on this image. Spinal
stimulator tip overlies the lower thoracic spine. Stable
cardiomediastinal silhouette with mild cardiomegaly. No
pneumothorax. No pleural effusion. Severe fluffy parahilar airspace
opacities throughout both lungs, significantly worsened.
IMPRESSION: 1. Endotracheal tube tip is 1.1 cm above the carina. Recommend
retracting 1 cm.
2. Stable mild cardiomegaly. Significant worsening of severe fluffy
parahilar airspace opacities throughout both lungs, most suggestive
of severe pulmonary edema.
These results will be called to the ordering clinician or
representative by the Radiologist Assistant, and communication
documented in the PACS or zVision Dashboard.
